# Patient Record
Sex: Female | Born: 1935
Health system: Southern US, Community
[De-identification: ages and names within clinical notes are randomized; demographics above are authoritative.]

## PROBLEM LIST (undated history)

## (undated) DIAGNOSIS — F329 Major depressive disorder, single episode, unspecified: Secondary | ICD-10-CM

## (undated) DIAGNOSIS — J45909 Unspecified asthma, uncomplicated: Secondary | ICD-10-CM

## (undated) DIAGNOSIS — I4891 Unspecified atrial fibrillation: Secondary | ICD-10-CM

## (undated) DIAGNOSIS — W19XXXA Unspecified fall, initial encounter: Secondary | ICD-10-CM

## (undated) DIAGNOSIS — K449 Diaphragmatic hernia without obstruction or gangrene: Secondary | ICD-10-CM

## (undated) DIAGNOSIS — I2510 Atherosclerotic heart disease of native coronary artery without angina pectoris: Secondary | ICD-10-CM

## (undated) DIAGNOSIS — M8580 Other specified disorders of bone density and structure, unspecified site: Secondary | ICD-10-CM

## (undated) DIAGNOSIS — N1832 Chronic kidney disease, stage 3b: Secondary | ICD-10-CM

## (undated) DIAGNOSIS — E785 Hyperlipidemia, unspecified: Secondary | ICD-10-CM

## (undated) DIAGNOSIS — R9431 Abnormal electrocardiogram [ECG] [EKG]: Secondary | ICD-10-CM

## (undated) DIAGNOSIS — Z86718 Personal history of other venous thrombosis and embolism: Secondary | ICD-10-CM

## (undated) DIAGNOSIS — I5030 Unspecified diastolic (congestive) heart failure: Secondary | ICD-10-CM

## (undated) DIAGNOSIS — J449 Chronic obstructive pulmonary disease, unspecified: Secondary | ICD-10-CM

## (undated) DIAGNOSIS — K219 Gastro-esophageal reflux disease without esophagitis: Secondary | ICD-10-CM

## (undated) DIAGNOSIS — I219 Acute myocardial infarction, unspecified: Secondary | ICD-10-CM

## (undated) DIAGNOSIS — Z7901 Long term (current) use of anticoagulants: Secondary | ICD-10-CM

## (undated) DIAGNOSIS — D649 Anemia, unspecified: Secondary | ICD-10-CM

## (undated) DIAGNOSIS — I10 Essential (primary) hypertension: Secondary | ICD-10-CM

## (undated) DIAGNOSIS — I700 Atherosclerosis of aorta: Secondary | ICD-10-CM

## (undated) DIAGNOSIS — I209 Angina pectoris, unspecified: Secondary | ICD-10-CM

## (undated) DIAGNOSIS — K635 Polyp of colon: Secondary | ICD-10-CM

## (undated) DIAGNOSIS — E440 Moderate protein-calorie malnutrition: Secondary | ICD-10-CM

## (undated) DIAGNOSIS — M419 Scoliosis, unspecified: Secondary | ICD-10-CM

## (undated) DIAGNOSIS — F32A Depression, unspecified: Secondary | ICD-10-CM

## (undated) DIAGNOSIS — H269 Unspecified cataract: Secondary | ICD-10-CM

## (undated) HISTORY — DX: Essential (primary) hypertension: I10

## (undated) HISTORY — DX: Hyperlipidemia, unspecified: E78.5

## (undated) HISTORY — DX: Unspecified fall, initial encounter: W19.XXXA

## (undated) HISTORY — DX: Major depressive disorder, single episode, unspecified: F32.9

## (undated) HISTORY — DX: Gastro-esophageal reflux disease without esophagitis: K21.9

## (undated) HISTORY — PX: ABDOMINAL HYSTERECTOMY: SHX81

## (undated) HISTORY — DX: Unspecified cataract: H26.9

## (undated) HISTORY — DX: Depression, unspecified: F32.A

## (undated) HISTORY — PX: TUBAL LIGATION: SHX77

## (undated) HISTORY — PX: TONSILLECTOMY: SUR1361

## (undated) NOTE — *Deleted (*Deleted)
[facility]  [address], [address] [place], [place] [address] Phone: [phone]  Fax: [phone]   Clinic Day:  04/02/2020  Referring physician: Reubin Milan, MD  Chief Complaint: Vanessa Farmer is a 37 y.o. female with mild thrombocytopenia and iron deficiency anemia who is seen for 6 month assessment.   HPI: The patient was last seen in the hematology clinic on 03/28/2019.  At that time, she felt "pretty good".  Energy level remained low.  Exam was stable. Hematocrit was 34.4, hemoglobin 11.5, MCV 99.1, platelets 154,000, WBC 3,600 (ANC 2,300). Platelet count in a citrate tube was 136,400. Sodium was 133. Creatinine was 1.33 (CrCl 37 ml/min). Ferritin was 90. PTT was 30. Hepatitis B core antibody was negative. ANA was positive with ribonucleic protein 1.3 (0-0.9).  During the interim, ***   Past Medical History:  Diagnosis Date  . Anemia   . Cataract   . Depression   . Fall   . GERD (gastroesophageal reflux disease)   . Hyperlipidemia   . Hypertension   . Myocardial infarction [facility])    "mild" many yrs ago    Past Surgical History:  Procedure Laterality Date  . ABDOMINAL HYSTERECTOMY    . CATARACT EXTRACTION, BILATERAL  2021  . COLONOSCOPY WITH PROPOFOL N/A 12/05/2017   Procedure: COLONOSCOPY WITH PROPOFOL;  Surgeon: Midge Minium, MD;  Location: [facility];  Service: Endoscopy;  Laterality: N/A;  . ESOPHAGOGASTRODUODENOSCOPY (EGD) WITH PROPOFOL N/A 12/05/2017   Procedure: ESOPHAGOGASTRODUODENOSCOPY (EGD) WITH PROPOFOL;  Surgeon: Midge Minium, MD;  Location: [facility];  Service: Endoscopy;  Laterality: N/A;  . POLYPECTOMY  12/05/2017   Procedure: POLYPECTOMY;  Surgeon: Midge Minium, MD;  Location: [facility];  Service: Endoscopy;;  . TONSILLECTOMY      Family History  Problem Relation Age of Onset  . Heart disease Mother   . Brain cancer Mother   . Heart attack Father   . Liver disease Daughter   . Throat  cancer Brother   . Cancer Maternal Grandfather        Unsure type    Social History:  reports that she has never smoked. She has never used smokeless tobacco. She reports that she does not drink alcohol and does not use drugs. She is originally from [place]. She lives with her granddaughter Wyatt Mage.  She lives in [place].  The patient is alone*** today.  Allergies: No Known Allergies  Current Medications: Current Outpatient Medications  Medication Sig Dispense Refill  . acetaminophen (TYLENOL) 500 MG tablet Take 2 tablets (1,000 mg total) by mouth 2 (two) times daily. 30 tablet   . amLODipine (NORVASC) 10 MG tablet Take 1 tablet (10 mg total) by mouth daily. 90 tablet 1  . aspirin 325 MG tablet Take 325 mg by mouth daily.    Marland Kitchen atorvastatin (LIPITOR) 40 MG tablet TAKE 1 TABLET(40 MG) BY MOUTH DAILY 90 tablet 1  . cholecalciferol (VITAMIN D3) 25 MCG (1000 UT) tablet Take 1,000 Units by mouth daily.    Marland Kitchen ENSURE (ENSURE) Take 237 mLs by mouth 2 (two) times daily between meals. 237 mL 12  . ferrous sulfate (FEROSUL) 325 (65 FE) MG tablet TAKE 1 TABLET BY MOUTH TWICE DAILY WITH A MEAL 60 tablet 0  . lisinopril-hydrochlorothiazide (ZESTORETIC) 20-12.5 MG tablet Take 2 tablets by mouth daily. 180 tablet 1  . Multiple Vitamins-Calcium (ONE-A-DAY WOMENS PO) Take 1 tablet by mouth daily.    . pantoprazole (PROTONIX) 40 MG tablet TAKE 1  TABLET(40 MG) BY MOUTH DAILY 90 tablet 0  . sertraline (ZOLOFT) 50 MG tablet Take 1 tablet (50 mg total) by mouth daily. 90 tablet 1  . vitamin C (ASCORBIC ACID) 500 MG tablet Take 1 tablet (500 mg total) by mouth daily. 30 tablet 2   No current facility-administered medications for this visit.    Review of Systems  Constitutional: Positive for malaise/fatigue. Negative for chills, diaphoresis, fever and weight loss (up 3 pounds; baseline 115 pounds).       Feels "pretty good".  Low energy.  HENT: Negative for congestion, ear pain, hearing loss, nosebleeds and  sore throat.   Eyes: Negative for double vision, photophobia and pain.       Cataracts.  Respiratory: Positive for shortness of breath (exertional). Negative for cough and wheezing.   Cardiovascular: Positive for leg swelling (bilateral ankles). Negative for chest pain, palpitations and PND.  Gastrointestinal: Positive for melena (on oral iron). Negative for abdominal pain, blood in stool, constipation, diarrhea, heartburn (improving), nausea and vomiting.       Eating well.  Genitourinary: Negative.  Negative for dysuria, frequency and urgency.  Musculoskeletal: Positive for back pain (scoliosis). Negative for joint pain and myalgias.       Leg cramps.  Skin: Negative.  Negative for rash.  Neurological: Positive for headaches. Negative for dizziness, tingling, sensory change and weakness.  Endo/Heme/Allergies: Negative.  Does not bruise/bleed easily.  Psychiatric/Behavioral: Negative.  Negative for depression (good) and memory loss. The patient is not nervous/anxious and does not have insomnia.   All other systems reviewed and are negative.  Performance status (ECOG): 1-2***  Vitals There were no vitals taken for this visit.   Physical Exam Vitals and nursing note reviewed.  Constitutional:      General: She is not in acute distress.    Appearance: She is well-developed. She is not diaphoretic.     Comments: Elderly woman sitting comfortably in the exam room in no acute distress.  She has a cane at her side.  HENT:     Head: Normocephalic and atraumatic.     Mouth/Throat:     Pharynx: No oropharyngeal exudate.  Eyes:     General: No scleral icterus.    Conjunctiva/sclera: Conjunctivae normal.     Pupils: Pupils are equal, round, and reactive to light.     Comments: Glasses.  Neck:     Vascular: No JVD.  Cardiovascular:     Rate and Rhythm: Normal rate and regular rhythm.     Heart sounds: Normal heart sounds. No murmur heard.   Pulmonary:     Effort: Pulmonary effort is  normal. No respiratory distress.     Breath sounds: Normal breath sounds. No wheezing or rales.  Abdominal:     General: Bowel sounds are normal. There is no distension.     Palpations: Abdomen is soft. There is no mass.     Tenderness: There is no abdominal tenderness. There is no guarding or rebound.  Musculoskeletal:        General: No tenderness. Normal range of motion.     Cervical back: Normal range of motion and neck supple.     Comments: Kyphosis.  Scoliosis.  Lymphadenopathy:     Head:     Right side of head: No preauricular, posterior auricular or occipital adenopathy.     Left side of head: No preauricular, posterior auricular or occipital adenopathy.     Cervical: No cervical adenopathy.     Upper Body:  Right upper body: No supraclavicular adenopathy.     Left upper body: No supraclavicular adenopathy.     Lower Body: No right inguinal adenopathy. No left inguinal adenopathy.  Skin:    General: Skin is warm and dry.     Coloration: Skin is not pale.     Findings: No erythema or rash.  Neurological:     Mental Status: She is alert and oriented to person, place, and time.  Psychiatric:        Behavior: Behavior normal.        Thought Content: Thought content normal.        Judgment: Judgment normal.     No visits with results within 3 Day(s) from this visit.  Latest known visit with results is:  Office Visit on 09/26/2019  Component Date Value Ref Range Status  . TSH 09/26/2019 1.470  0.450 - 4.500 uIU/mL Final  . Free T4 09/26/2019 1.13  0.82 - 1.77 ng/dL Final  . Glucose 16/02/9603 94  65 - 99 mg/dL Final  . BUN 54/01/8118 29* 8 - 27 mg/dL Final  . Creatinine, Ser 09/26/2019 1.31* 0.57 - 1.00 mg/dL Final  . GFR calc non Af Amer 09/26/2019 38* >59 mL/min/1.73 Final  . GFR calc Af Amer 09/26/2019 44* >59 mL/min/1.73 Final   Comment: **[facility] currently reports eGFR in compliance with the current**   recommendations of the [employer]. [facility]  will   update reporting as new guidelines are published from the NKF-ASN   Task force.   . BUN/Creatinine Ratio 09/26/2019 22  12 - 28 Final  . Sodium 09/26/2019 139  134 - 144 mmol/L Final  . Potassium 09/26/2019 4.0  3.5 - 5.2 mmol/L Final  . Chloride 09/26/2019 98  96 - 106 mmol/L Final  . CO2 09/26/2019 26  20 - 29 mmol/L Final  . Calcium 09/26/2019 9.2  8.7 - 10.3 mg/dL Final  . Total Protein 09/26/2019 6.5  6.0 - 8.5 g/dL Final  . Albumin 14/78/2956 4.0  3.6 - 4.6 g/dL Final  . Globulin, Total 09/26/2019 2.5  1.5 - 4.5 g/dL Final  . Albumin/Globulin Ratio 09/26/2019 1.6  1.2 - 2.2 Final  . Bilirubin Total 09/26/2019 0.3  0.0 - 1.2 mg/dL Final  . Alkaline Phosphatase 09/26/2019 65  48 - 121 IU/L Final                 **Please note reference interval change**  . AST 09/26/2019 25  0 - 40 IU/L Final  . ALT 09/26/2019 14  0 - 32 IU/L Final  . Cholesterol, Total 09/26/2019 202* 100 - 199 mg/dL Final  . Triglycerides 09/26/2019 79  0 - 149 mg/dL Final  . HDL 21/30/8657 61  >39 mg/dL Final  . VLDL Cholesterol Cal 09/26/2019 14  5 - 40 mg/dL Final  . LDL Chol Calc (NIH) 09/26/2019 127* 0 - 99 mg/dL Final  . Chol/HDL Ratio 09/26/2019 3.3  0.0 - 4.4 ratio Final   Comment:                                   T. Chol/HDL Ratio                                             Men  Women  1/2 Avg.Risk  3.4    3.3                                   Avg.Risk  5.0    4.4                                2X Avg.Risk  9.6    7.1                                3X Avg.Risk 23.4   11.0     Assessment:  Vanessa Farmer is a 78 y.o. female with mild thrombocytopenia and iron deficiency anemia.  Platelet count has ranged between 138,000 - 158,000.  Diet is good.  She is on oral iron.  Work-up on 10/06/2017 revealed a hematocrit of 32.8, hemoglobin 10.9, MCV 93.9, WBC 4,300 (ANC 2,900), and platelets 152,000.  Ferritin was 29 with an iron saturation 10% and TIBC 354.  Normal  studies included:  B12 (744), folate (45), acute hepatitis panel, HIV testing, LDH 160, SPEP, and flow cytometry.   Peripheral smear was unremarkable.  Urinalysis on 10/24/2017 revealed no hematuria.  TSH was normal on 09/29/2017.  Abdominal ultrasound on 10/11/2017 revealed a normal spleen.   EGD on 12/05/2017 revealed a 10 cm hiatal hernia and grade C reflux esophagitis. Examined duodenum was normal.  Colonoscopy on 12/05/2017 revealed three 1-2 mm polyps in the distal sigmoid colon. There were non-bleeding internal hemorrhoids. Pathology revealed hyperplastic polyps and small bowel mucosa with no significant pathologic changes.   Ferritin has been followed: 29 on 10/06/2017, 34 on 01/18/2018, 44 on 04/20/2018, 88 on 09/21/2018, and 90 on 03/28/2019.   She has chronic kidney disease. Creatinine was (CrCl 36 ml/min) on 09/29/2017.  Symptomatically, ***  Plan: 1.   Labs today: CBC with diff, ferritin, iron studies, B12, folate.   2.   Iron deficiency anemia             Hematocrit 34.3.  Hemoglobin 11.5.  MCV 99.1.   Ferritin 90.  Diet appears good.  She denies any bleeding.             EGD and colonoscopy in 11/2017 revealed reflux only.             She is taking ferrous sulfate 325 mg/day.  Ferritin goal 100. 3.   Thrombocytopenia, resolved             Platelets 154,000 (normal).  Prior work-up was negative.  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             Additional labs drawn today.                           Platelet count in a blue top tube 136,400 to r/o pseudothrombocytopenia.                         PTT and hepatitis B testing were negative.   ANA was + today with 1.3 ribonucleic protein (0-0.9).    Significance unclear. 4.   RTC in 1 year for MD assessment and labs (CBC with diff, ferritin).  I discussed the assessment and treatment plan with the patient.  The patient was provided  an opportunity to ask questions and all were answered.  The patient agreed with the  plan and demonstrated an understanding of the instructions.  The patient was advised to call back if the symptoms worsen or if the condition fails to improve as anticipated.  I provided *** minutes of face-to-face time during this this encounter and > 50% was spent counseling as documented under my assessment and plan.  Rosey Bath, MD, PhD    04/02/2020, 12:26 PM  I, Danella Penton Tufford, am acting as Neurosurgeon for [employer]. Merlene Pulling, MD, PhD.  I, Melissa C. Merlene Pulling, MD, have reviewed the above documentation for accuracy and completeness, and I agree with the above.

## (undated) NOTE — *Deleted (*Deleted)
[facility]  [address] [place], [place] [address] Phone: [phone]  Fax: [phone]   Clinic Day:  03/26/2020  Referring physician: Reubin Milan, MD  Chief Complaint: Vanessa Farmer is a 30 y.o. female with mild thrombocytopenia and iron deficiency anemia who is seen for 6 month assessment.   HPI: The patient was last seen in the hematology clinic on 03/28/2019. At that time, she felt "pretty good".  Energy level remained low.  Exam was stable. Hematocrit was 34.4, hemoglobin 11.5, MCV 99.1, platelets 154,000, WBC 3,600 (ANC 2,300). Platelet by citrate was 136.4. Sodium was 133. Chloride was 95. BUN was 27. Creatinine was 1.33 (CrCl 37 ml/min). Ferritin was 90. APTT was 30. Hepatitis B core antibody was negative. ANA was positive. Ribonucleic protein was 1.3.  During the interim, ***   Past Medical History:  Diagnosis Date  . Anemia   . Cataract   . Depression   . Fall   . GERD (gastroesophageal reflux disease)   . Hyperlipidemia   . Hypertension   . Myocardial infarction [facility])    "mild" many yrs ago    Past Surgical History:  Procedure Laterality Date  . ABDOMINAL HYSTERECTOMY    . CATARACT EXTRACTION, BILATERAL  2021  . COLONOSCOPY WITH PROPOFOL N/A 12/05/2017   Procedure: COLONOSCOPY WITH PROPOFOL;  Surgeon: Midge Minium, MD;  Location: [facility];  Service: Endoscopy;  Laterality: N/A;  . ESOPHAGOGASTRODUODENOSCOPY (EGD) WITH PROPOFOL N/A 12/05/2017   Procedure: ESOPHAGOGASTRODUODENOSCOPY (EGD) WITH PROPOFOL;  Surgeon: Midge Minium, MD;  Location: [facility];  Service: Endoscopy;  Laterality: N/A;  . POLYPECTOMY  12/05/2017   Procedure: POLYPECTOMY;  Surgeon: Midge Minium, MD;  Location: [facility];  Service: Endoscopy;;  . TONSILLECTOMY      Family History  Problem Relation Age of Onset  . Heart disease Mother   . Brain cancer Mother   . Heart attack Father   . Liver disease Daughter   .  Throat cancer Brother   . Cancer Maternal Grandfather        Unsure type    Social History:  reports that she has never smoked. She has never used smokeless tobacco. She reports that she does not drink alcohol and does not use drugs. She is originally from [place]. She lives with her granddaughter Wyatt Mage.  She lives in [place].  The patient is alone*** today.  Allergies: No Known Allergies  Current Medications: Current Outpatient Medications  Medication Sig Dispense Refill  . acetaminophen (TYLENOL) 500 MG tablet Take 2 tablets (1,000 mg total) by mouth 2 (two) times daily. 30 tablet   . amLODipine (NORVASC) 10 MG tablet Take 1 tablet (10 mg total) by mouth daily. 90 tablet 1  . aspirin 325 MG tablet Take 325 mg by mouth daily.    Marland Kitchen atorvastatin (LIPITOR) 40 MG tablet TAKE 1 TABLET(40 MG) BY MOUTH DAILY 90 tablet 1  . cholecalciferol (VITAMIN D3) 25 MCG (1000 UT) tablet Take 1,000 Units by mouth daily.    Marland Kitchen ENSURE (ENSURE) Take 237 mLs by mouth 2 (two) times daily between meals. 237 mL 12  . ferrous sulfate (FEROSUL) 325 (65 FE) MG tablet TAKE 1 TABLET BY MOUTH TWICE DAILY WITH A MEAL 60 tablet 0  . lisinopril-hydrochlorothiazide (ZESTORETIC) 20-12.5 MG tablet Take 2 tablets by mouth daily. 180 tablet 1  . Multiple Vitamins-Calcium (ONE-A-DAY WOMENS PO) Take 1 tablet by mouth daily.    . pantoprazole (PROTONIX) 40 MG tablet TAKE  1 TABLET(40 MG) BY MOUTH DAILY 90 tablet 0  . sertraline (ZOLOFT) 50 MG tablet Take 1 tablet (50 mg total) by mouth daily. 90 tablet 1  . vitamin C (ASCORBIC ACID) 500 MG tablet Take 1 tablet (500 mg total) by mouth daily. 30 tablet 2   No current facility-administered medications for this visit.    Review of Systems  Constitutional: Positive for malaise/fatigue. Negative for chills, diaphoresis, fever and weight loss (up 3 pounds; baseline 115 pounds).       Feels "pretty good".  Low energy.  HENT: Negative for congestion, ear pain, hearing loss,  nosebleeds and sore throat.   Eyes: Negative for double vision, photophobia and pain.       Cataracts.  Respiratory: Positive for shortness of breath (exertional). Negative for cough and wheezing.   Cardiovascular: Positive for leg swelling (bilateral ankles). Negative for chest pain, palpitations and PND.  Gastrointestinal: Positive for melena (on oral iron). Negative for abdominal pain, blood in stool, constipation, diarrhea, heartburn (improving), nausea and vomiting.       Eating well.  Genitourinary: Negative.  Negative for dysuria, frequency and urgency.  Musculoskeletal: Positive for back pain (scoliosis). Negative for joint pain and myalgias.       Leg cramps.  Skin: Negative.  Negative for rash.  Neurological: Positive for headaches. Negative for dizziness, tingling, sensory change and weakness.  Endo/Heme/Allergies: Negative.  Does not bruise/bleed easily.  Psychiatric/Behavioral: Negative.  Negative for depression (good) and memory loss. The patient is not nervous/anxious and does not have insomnia.   All other systems reviewed and are negative.  Performance status (ECOG): 1-2***  Vitals There were no vitals taken for this visit.   Physical Exam Vitals and nursing note reviewed.  Constitutional:      General: She is not in acute distress.    Appearance: She is well-developed. She is not diaphoretic.     Comments: Elderly woman sitting comfortably in the exam room in no acute distress.  She has a cane at her side.  HENT:     Head: Normocephalic and atraumatic.     Mouth/Throat:     Pharynx: No oropharyngeal exudate.  Eyes:     General: No scleral icterus.    Conjunctiva/sclera: Conjunctivae normal.     Pupils: Pupils are equal, round, and reactive to light.     Comments: Glasses.  Neck:     Vascular: No JVD.  Cardiovascular:     Rate and Rhythm: Normal rate and regular rhythm.     Heart sounds: Normal heart sounds. No murmur heard.   Pulmonary:     Effort:  Pulmonary effort is normal. No respiratory distress.     Breath sounds: Normal breath sounds. No wheezing or rales.  Abdominal:     General: Bowel sounds are normal. There is no distension.     Palpations: Abdomen is soft. There is no mass.     Tenderness: There is no abdominal tenderness. There is no guarding or rebound.  Musculoskeletal:        General: No tenderness. Normal range of motion.     Cervical back: Normal range of motion and neck supple.     Comments: Kyphosis.  Scoliosis.  Lymphadenopathy:     Head:     Right side of head: No preauricular, posterior auricular or occipital adenopathy.     Left side of head: No preauricular, posterior auricular or occipital adenopathy.     Cervical: No cervical adenopathy.     Upper  Body:     Right upper body: No supraclavicular adenopathy.     Left upper body: No supraclavicular adenopathy.     Lower Body: No right inguinal adenopathy. No left inguinal adenopathy.  Skin:    General: Skin is warm and dry.     Coloration: Skin is not pale.     Findings: No erythema or rash.  Neurological:     Mental Status: She is alert and oriented to person, place, and time.  Psychiatric:        Behavior: Behavior normal.        Thought Content: Thought content normal.        Judgment: Judgment normal.     No visits with results within 3 Day(s) from this visit.  Latest known visit with results is:  Office Visit on 09/26/2019  Component Date Value Ref Range Status  . TSH 09/26/2019 1.470  0.450 - 4.500 uIU/mL Final  . Free T4 09/26/2019 1.13  0.82 - 1.77 ng/dL Final  . Glucose 16/02/9603 94  65 - 99 mg/dL Final  . BUN 54/01/8118 29* 8 - 27 mg/dL Final  . Creatinine, Ser 09/26/2019 1.31* 0.57 - 1.00 mg/dL Final  . GFR calc non Af Amer 09/26/2019 38* >59 mL/min/1.73 Final  . GFR calc Af Amer 09/26/2019 44* >59 mL/min/1.73 Final   Comment: **[facility] currently reports eGFR in compliance with the current**   recommendations of the [employer]. [facility] will   update reporting as new guidelines are published from the NKF-ASN   Task force.   . BUN/Creatinine Ratio 09/26/2019 22  12 - 28 Final  . Sodium 09/26/2019 139  134 - 144 mmol/L Final  . Potassium 09/26/2019 4.0  3.5 - 5.2 mmol/L Final  . Chloride 09/26/2019 98  96 - 106 mmol/L Final  . CO2 09/26/2019 26  20 - 29 mmol/L Final  . Calcium 09/26/2019 9.2  8.7 - 10.3 mg/dL Final  . Total Protein 09/26/2019 6.5  6.0 - 8.5 g/dL Final  . Albumin 14/78/2956 4.0  3.6 - 4.6 g/dL Final  . Globulin, Total 09/26/2019 2.5  1.5 - 4.5 g/dL Final  . Albumin/Globulin Ratio 09/26/2019 1.6  1.2 - 2.2 Final  . Bilirubin Total 09/26/2019 0.3  0.0 - 1.2 mg/dL Final  . Alkaline Phosphatase 09/26/2019 65  48 - 121 IU/L Final                 **Please note reference interval change**  . AST 09/26/2019 25  0 - 40 IU/L Final  . ALT 09/26/2019 14  0 - 32 IU/L Final  . Cholesterol, Total 09/26/2019 202* 100 - 199 mg/dL Final  . Triglycerides 09/26/2019 79  0 - 149 mg/dL Final  . HDL 21/30/8657 61  >39 mg/dL Final  . VLDL Cholesterol Cal 09/26/2019 14  5 - 40 mg/dL Final  . LDL Chol Calc (NIH) 09/26/2019 127* 0 - 99 mg/dL Final  . Chol/HDL Ratio 09/26/2019 3.3  0.0 - 4.4 ratio Final   Comment:                                   T. Chol/HDL Ratio                                             Men  Women                               1/2 Avg.Risk  3.4    3.3                                   Avg.Risk  5.0    4.4                                2X Avg.Risk  9.6    7.1                                3X Avg.Risk 23.4   11.0     Assessment:  CALDONIA LEAP is a 8 y.o. female with mild thrombocytopenia and iron deficiency anemia.  Platelet count has ranged between 138,000 - 158,000.  Diet is good.  She is on oral iron.  Work-up on 10/06/2017 revealed a hematocrit of 32.8, hemoglobin 10.9, MCV 93.9, WBC 4,300 (ANC 2,900), and platelets 152,000.  Ferritin was 29 with an iron saturation 10% and TIBC  354.  Normal studies included:  B12 (744), folate (45), acute hepatitis panel, HIV testing, LDH 160, SPEP, and flow cytometry.   Peripheral smear was unremarkable.  Urinalysis on 10/24/2017 revealed no hematuria.  TSH was normal on 09/29/2017.  Abdominal ultrasound on 10/11/2017 revealed a normal spleen.   EGD on 12/05/2017 revealed a 10 cm hiatal hernia and grade C reflux esophagitis. Examined duodenum was normal.  Colonoscopy on 12/05/2017 revealed three 1-2 mm polyps in the distal sigmoid colon. There were non-bleeding internal hemorrhoids. Pathology revealed hyperplastic polyps and small bowel mucosa with no significant pathologic changes.   Ferritin has been followed: 29 on 10/06/2017, 34 on 01/18/2018, 44 on 04/20/2018, 88 on 09/21/2018, and 90 on 03/28/2019.   She has chronic kidney disease. Creatinine was (CrCl 36 ml/min) on 09/29/2017.  Symptomatically, ***  Plan: 1.   Labs today: CBC with diff, ferritin   2.   Iron deficiency anemia             Hematocrit 34.3.  Hemoglobin 11.5.  MCV 99.1.   Ferritin 90.  Diet appears good.  She denies any bleeding.             EGD and colonoscopy in 11/2017 revealed reflux only.             She is taking ferrous sulfate 325 mg/day.  Ferritin goal 100. 3.   Thrombocytopenia, resolved             Platelets 154,000 (normal).  Prior work-up was negative.  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             Additional labs drawn today.                           Platelet count in a blue top tube 136,400 to r/o pseudothrombocytopenia.                         PTT and hepatitis B testing were negative.   ANA was + today with 1.3 ribonucleic protein (0-0.9).    Significance unclear. 4.   RTC in  1 year for MD assessment and labs (CBC with diff, ferritin).  I discussed the assessment and treatment plan with the patient.  The patient was provided an opportunity to ask questions and all were answered.  The patient agreed with the plan and  demonstrated an understanding of the instructions.  The patient was advised to call back if the symptoms worsen or if the condition fails to improve as anticipated.  I provided *** minutes of face-to-face time during this this encounter and > 50% was spent counseling as documented under my assessment and plan.  Rosey Bath, MD, PhD    03/26/2020, 11:24 AM  I, Danella Penton Tufford, am acting as Neurosurgeon for [employer]. Merlene Pulling, MD, PhD.  I, Melissa C. Merlene Pulling, MD, have reviewed the above documentation for accuracy and completeness, and I agree with the above.

---

## 2016-06-07 ENCOUNTER — Encounter: Payer: Self-pay | Admitting: Family Medicine

## 2016-06-07 ENCOUNTER — Ambulatory Visit (INDEPENDENT_AMBULATORY_CARE_PROVIDER_SITE_OTHER): Payer: Medicare HMO | Admitting: Family Medicine

## 2016-06-07 VITALS — BP 138/80 | HR 84 | Resp 16 | Ht 59.0 in | Wt 113.0 lb

## 2016-06-07 DIAGNOSIS — I2510 Atherosclerotic heart disease of native coronary artery without angina pectoris: Secondary | ICD-10-CM | POA: Insufficient documentation

## 2016-06-07 DIAGNOSIS — M810 Age-related osteoporosis without current pathological fracture: Secondary | ICD-10-CM | POA: Diagnosis not present

## 2016-06-07 DIAGNOSIS — I10 Essential (primary) hypertension: Secondary | ICD-10-CM | POA: Insufficient documentation

## 2016-06-07 DIAGNOSIS — M419 Scoliosis, unspecified: Secondary | ICD-10-CM

## 2016-06-07 DIAGNOSIS — F321 Major depressive disorder, single episode, moderate: Secondary | ICD-10-CM | POA: Insufficient documentation

## 2016-06-07 DIAGNOSIS — Z23 Encounter for immunization: Secondary | ICD-10-CM | POA: Diagnosis not present

## 2016-06-07 DIAGNOSIS — R2681 Unsteadiness on feet: Secondary | ICD-10-CM

## 2016-06-07 NOTE — Progress Notes (Signed)
Date:  06/07/2016   Name:  Vanessa Farmer   DOB:  [birth year]   MRN:  [id number]  PCP:  No primary care provider on file.    Chief Complaint: Establish Care (ER FU after fall in December. )   History of Present Illness:  This is a 81 y.o. female seen for initial visit. HTN on Prinzide x yrs, usually well controlled. CAD s/p MI 15 yrs ago, no current angina but some chronic DOE. Told has osteoporosis and scoliosis but no known fractures. C/o R hip pain, fell one month ago but no pain then, on Mobic x 3 months, helps. Takes Tylenol prn headaches about twice a week. Hx depression on Zoloft x 2 months, seems to help. Hospitalized 2 months ago for dehydration and pneumonia. Father died pneumonia in 37's, mother brain tumor in 31's, brother throat ca and cirrhosis, sister has had multiple jt replacements. No tobacco or alcohol use. Had flu and zoster imms but no pneumo or recent tet imms. Has occ pedal edema, none today.  Review of Systems:  Review of Systems  Constitutional: Positive for fatigue. Negative for chills and fever.  HENT: Negative for ear pain and trouble swallowing.   Eyes: Negative for pain.  Respiratory: Negative for cough and wheezing.   Cardiovascular: Negative for chest pain.  Gastrointestinal: Negative for abdominal pain.  Endocrine: Negative for polydipsia and polyuria.  Genitourinary: Negative for dysuria.  Musculoskeletal: Negative for myalgias.  Neurological: Negative for tremors, syncope and light-headedness.  Hematological: Negative for adenopathy.  Psychiatric/Behavioral: Negative for agitation and confusion.    Patient Active Problem List   Diagnosis Date Noted  . Scoliosis 06/07/2016  . Osteoporosis 06/07/2016  . Hypertension 06/07/2016  . CAD (coronary artery disease) 06/07/2016  . Depression, major, single episode, moderate ([place]) 06/07/2016    Prior to Admission medications   Medication Sig Start Date End Date Taking? Authorizing Provider  acetaminophen  (TYLENOL) 500 MG tablet Take 500 mg by mouth every 6 (six) hours as needed.   Yes Historical Provider, MD  aspirin 81 MG chewable tablet Chew 1 tablet by mouth daily.   Yes Historical Provider, MD  lisinopril-hydrochlorothiazide (PRINZIDE,ZESTORETIC) 20-12.5 MG tablet Take 2 tablets by mouth daily.   Yes Historical Provider, MD  meloxicam (MOBIC) 15 MG tablet Take 15 mg by mouth daily.   Yes Historical Provider, MD  Multiple Vitamins-Calcium (ONE-A-DAY WOMENS PO) Take 1 tablet by mouth daily.   Yes Historical Provider, MD  sertraline (ZOLOFT) 50 MG tablet Take 50 mg by mouth daily.   Yes Historical Provider, MD    No Known Allergies  History reviewed. No pertinent surgical history.  Social History  Substance Use Topics  . Smoking status: Never Smoker  . Smokeless tobacco: Never Used  . Alcohol use No    History reviewed. No pertinent family history.  Medication list has been reviewed and updated.  Physical Examination: BP 138/80   Pulse 84   Resp 16   SpO2 95%   Physical Exam  Constitutional: She is oriented to person, place, and time. She appears well-developed and well-nourished.  HENT:  Head: Normocephalic and atraumatic.  Right Ear: External ear normal.  Left Ear: External ear normal.  Nose: Nose normal.  Mouth/Throat: Oropharynx is clear and moist.  TMs clear Dentition poor  Eyes: Conjunctivae and EOM are normal. Pupils are equal, round, and reactive to light. No scleral icterus.  Neck: Neck supple. No thyromegaly present.  Cardiovascular: Normal rate, regular rhythm and normal heart sounds.  Pulmonary/Chest: Effort normal and breath sounds normal.  Abdominal: Soft. She exhibits no distension and no mass. There is no tenderness.  Musculoskeletal: Normal range of motion. She exhibits no edema.  Neg B SLR  Marked thoracolumbar scoliosis  Lymphadenopathy:    She has no cervical adenopathy.  Neurological: She is alert and oriented to person, place, and time.   Mini-cog 5/5 Romberg neg Gait unsteady  Skin: Skin is warm and dry.  Psychiatric: She has a normal mood and affect. Her behavior is normal.  Nursing note and vitals reviewed.   Assessment and Plan:  1. Essential hypertension Marginal control, may improve off NSAID - Comprehensive Metabolic Panel (CMET)  2. Coronary artery disease involving native coronary artery of native heart without angina pectoris Stable on asa, consider statin - CBC - Lipid Profile  3. Age-related osteoporosis without current pathological fracture - Vitamin D (25 hydroxy)  4. Depression, major, single episode, moderate (HCC) Improved on Zoloft - TSH  5. Scoliosis of thoracolumbar spine, unspecified scoliosis type  6. Need for pneumococcal vaccination - Pneumococcal conjugate vaccine 13-valent  7. Need for immunization with diphtheria, tetanus, and poliovirus vaccine - Tdap vaccine greater than or equal to 7yo IM  8. Gait instability - B12  9. Med review Consider d/c Mobic/Zoloft next visit  Return in about 4 weeks (around 07/05/2016).   45 minutes spent with patient, over half in counseling  [name]. [facility]  06/07/2016

## 2016-06-08 ENCOUNTER — Encounter: Payer: Self-pay | Admitting: Family Medicine

## 2016-06-08 ENCOUNTER — Other Ambulatory Visit: Payer: Self-pay | Admitting: Family Medicine

## 2016-06-08 DIAGNOSIS — E785 Hyperlipidemia, unspecified: Secondary | ICD-10-CM | POA: Insufficient documentation

## 2016-06-08 DIAGNOSIS — N183 Chronic kidney disease, stage 3 unspecified: Secondary | ICD-10-CM | POA: Insufficient documentation

## 2016-06-08 LAB — CBC
HEMOGLOBIN: 12.1 g/dL (ref 11.1–15.9)
Hematocrit: 35.9 % (ref 34.0–46.6)
MCH: 30.7 pg (ref 26.6–33.0)
MCHC: 33.7 g/dL (ref 31.5–35.7)
MCV: 91 fL (ref 79–97)
Platelets: 200 10*3/uL (ref 150–379)
RBC: 3.94 x10E6/uL (ref 3.77–5.28)
RDW: 14.8 % (ref 12.3–15.4)
WBC: 6.7 10*3/uL (ref 3.4–10.8)

## 2016-06-08 LAB — COMPREHENSIVE METABOLIC PANEL
A/G RATIO: 1.5 (ref 1.2–2.2)
ALK PHOS: 110 IU/L (ref 39–117)
ALT: 12 IU/L (ref 0–32)
AST: 14 IU/L (ref 0–40)
Albumin: 4.1 g/dL (ref 3.5–4.8)
BUN/Creatinine Ratio: 35 — ABNORMAL HIGH (ref 12–28)
BUN: 40 mg/dL — ABNORMAL HIGH (ref 8–27)
CHLORIDE: 95 mmol/L — AB (ref 96–106)
CO2: 28 mmol/L (ref 18–29)
Calcium: 9.4 mg/dL (ref 8.7–10.3)
Creatinine, Ser: 1.15 mg/dL — ABNORMAL HIGH (ref 0.57–1.00)
GFR calc Af Amer: 53 mL/min/{1.73_m2} — ABNORMAL LOW (ref >59)
GFR calc non Af Amer: 46 mL/min/{1.73_m2} — ABNORMAL LOW (ref >59)
Globulin, Total: 2.8 g/dL (ref 1.5–4.5)
Glucose: 98 mg/dL (ref 65–99)
POTASSIUM: 4.9 mmol/L (ref 3.5–5.2)
Sodium: 137 mmol/L (ref 134–144)
Total Protein: 6.9 g/dL (ref 6.0–8.5)

## 2016-06-08 LAB — VITAMIN B12: Vitamin B-12: 669 pg/mL (ref 232–1245)

## 2016-06-08 LAB — LIPID PANEL
CHOL/HDL RATIO: 3.8 ratio (ref 0.0–4.4)
Cholesterol, Total: 222 mg/dL — ABNORMAL HIGH (ref 100–199)
HDL: 59 mg/dL (ref >39)
LDL Calculated: 141 mg/dL — ABNORMAL HIGH (ref 0–99)
Triglycerides: 110 mg/dL (ref 0–149)
VLDL Cholesterol Cal: 22 mg/dL (ref 5–40)

## 2016-06-08 LAB — TSH: TSH: 3.24 u[IU]/mL (ref 0.450–4.500)

## 2016-06-08 LAB — VITAMIN D 25 HYDROXY (VIT D DEFICIENCY, FRACTURES): VIT D 25 HYDROXY: 40.5 ng/mL (ref 30.0–100.0)

## 2016-06-08 MED ORDER — ATORVASTATIN CALCIUM 40 MG PO TABS: 40.0000 mg | ORAL_TABLET | Freq: Every day | ORAL | 2 refills | Status: DC

## 2016-06-08 MED ORDER — ACETAMINOPHEN 500 MG PO TABS: 1000.0000 mg | ORAL_TABLET | Freq: Two times a day (BID) | ORAL | Status: DC

## 2016-06-14 ENCOUNTER — Other Ambulatory Visit: Payer: Self-pay

## 2016-06-14 MED ORDER — ATORVASTATIN CALCIUM 40 MG PO TABS: 40.0000 mg | ORAL_TABLET | Freq: Every day | ORAL | 2 refills | Status: DC

## 2016-06-14 MED ORDER — ACETAMINOPHEN 500 MG PO TABS: 1000.0000 mg | ORAL_TABLET | Freq: Two times a day (BID) | ORAL | Status: DC

## 2016-06-14 NOTE — Telephone Encounter (Signed)
meds went to wrong pharmacy I have sent them to [facility]. She said the bottle she found that needs filled is Amlodipine Besylate 5 mg daily. I have not sent this in since it is not in chart. She also reports going to [facility] program and said they were supposed to be contacting you to send notes and letter referring patient to [place]. I do not see where anything has been sent to [place].

## 2016-06-15 ENCOUNTER — Other Ambulatory Visit: Payer: Self-pay | Admitting: Family Medicine

## 2016-06-15 ENCOUNTER — Telehealth: Payer: Self-pay

## 2016-06-15 MED ORDER — AMLODIPINE BESYLATE 5 MG PO TABS: 5.0000 mg | ORAL_TABLET | Freq: Every day | ORAL | 3 refills | Status: DC

## 2016-06-15 NOTE — Telephone Encounter (Signed)
Amlodipine rx sent

## 2016-06-15 NOTE — Progress Notes (Signed)
amlo

## 2016-06-15 NOTE — Telephone Encounter (Signed)
Faxed DSS Papers

## 2016-07-05 ENCOUNTER — Ambulatory Visit: Payer: Medicare HMO | Admitting: Family Medicine

## 2016-07-27 ENCOUNTER — Ambulatory Visit: Payer: Medicare HMO | Admitting: Family Medicine

## 2016-08-26 ENCOUNTER — Ambulatory Visit: Payer: Medicare HMO | Admitting: Family Medicine

## 2016-09-20 ENCOUNTER — Ambulatory Visit (INDEPENDENT_AMBULATORY_CARE_PROVIDER_SITE_OTHER): Payer: Medicare HMO | Admitting: Family Medicine

## 2016-09-20 ENCOUNTER — Encounter: Payer: Self-pay | Admitting: Family Medicine

## 2016-09-20 VITALS — BP 138/82 | HR 73 | Resp 16 | Ht 59.0 in | Wt 110.0 lb

## 2016-09-20 DIAGNOSIS — N183 Chronic kidney disease, stage 3 unspecified: Secondary | ICD-10-CM

## 2016-09-20 DIAGNOSIS — I10 Essential (primary) hypertension: Secondary | ICD-10-CM

## 2016-09-20 DIAGNOSIS — E785 Hyperlipidemia, unspecified: Secondary | ICD-10-CM

## 2016-09-20 DIAGNOSIS — I2510 Atherosclerotic heart disease of native coronary artery without angina pectoris: Secondary | ICD-10-CM

## 2016-09-20 DIAGNOSIS — F321 Major depressive disorder, single episode, moderate: Secondary | ICD-10-CM

## 2016-09-20 DIAGNOSIS — M419 Scoliosis, unspecified: Secondary | ICD-10-CM

## 2016-09-20 MED ORDER — TRAMADOL HCL 50 MG PO TABS: 50.0000 mg | ORAL_TABLET | Freq: Three times a day (TID) | ORAL | 0 refills | Status: DC | PRN

## 2016-09-20 NOTE — Progress Notes (Signed)
Date:  09/20/2016   Name:  Vanessa Farmer              DOB:  May 31, 1935                       MRN:  [id number]  PCP:  Adline Potter, MD    Chief Complaint: Hypertension and Back Pain (Severe this week- Has scoliosis and was seeing pcp in [place]. Awaiting records. )   History of Present Illness:  This is a 81 y.o. female seen for one month f/u from initial visit. Blood work showed CKD3 and LDL 141 with known CVD. Never got notice to stop Mobic and start Lipitor. C/o worsened back pain, known severe scoliosis, had w/u in [place], placed on Mobic which doesn't help much, no other interventions recommended. Taking Tylenol 1000 mg qam only. Otherwise doing well, feels depression improved on Zoloft. Drinks caffeinated coffee all day.  Review of Systems:  Review of Systems  Constitutional: Negative for chills, fever and unexpected weight change.  Respiratory: Negative for cough and shortness of breath.   Cardiovascular: Negative for chest pain, palpitations and leg swelling.  Gastrointestinal: Negative for abdominal pain.  Genitourinary: Negative for difficulty urinating.  Neurological: Negative for syncope and light-headedness.        Patient Active Problem List   Diagnosis Date Noted  . CKD (chronic kidney disease) stage 3, GFR 30-59 ml/min 06/08/2016  . Hyperlipidemia 06/08/2016  . Scoliosis 06/07/2016  . Osteoporosis 06/07/2016  . Hypertension 06/07/2016  . CAD (coronary artery disease) 06/07/2016  . Depression, major, single episode, moderate ([place]) 06/07/2016           Prior to Admission medications   Medication Sig Start Date End Date Taking? Authorizing Provider  acetaminophen (TYLENOL) 500 MG tablet Take 2 tablets (1,000 mg total) by mouth 2 (two) times daily. 06/14/16  Yes Russell Quinney, Gwyndolyn Saxon, MD  amLODipine (NORVASC) 5 MG tablet Take 1 tablet (5 mg total) by mouth daily. 06/15/16  Yes Jeremih Dearmas, Gwyndolyn Saxon, MD  aspirin 81 MG chewable tablet Chew 1 tablet by mouth daily.    Yes [provider]  atorvastatin (LIPITOR) 40 MG tablet Take 1 tablet (40 mg total) by mouth daily. 06/14/16  Yes Joel Cowin, Gwyndolyn Saxon, MD  lisinopril-hydrochlorothiazide (PRINZIDE,ZESTORETIC) 20-12.5 MG tablet Take 2 tablets by mouth daily.   Yes [provider]  Multiple Vitamins-Calcium (ONE-A-DAY WOMENS PO) Take 1 tablet by mouth daily.   Yes [provider]  sertraline (ZOLOFT) 50 MG tablet Take 50 mg by mouth daily.   Yes [provider]  traMADol (ULTRAM) 50 MG tablet Take 1 tablet (50 mg total) by mouth every 8 (eight) hours as needed for severe pain. 09/20/16   Adline Potter, MD    No Known Allergies  History reviewed. No pertinent surgical history.      Social History  Substance Use Topics  . Smoking status: Never Smoker  . Smokeless tobacco: Never Used  . Alcohol use No    History reviewed. No pertinent family history.  Medication list has been reviewed and updated.  Physical Examination: BP 138/82   Pulse 73   Resp 16   Ht 4\' 11"  (1.499 m)   Wt 110 lb (49.9 kg)   SpO2 98%   BMI 22.22 kg/m   Physical Exam  Constitutional: She appears well-developed.  Cardiovascular: Normal rate, regular rhythm and normal heart sounds.   Pulmonary/Chest: Effort normal and breath sounds normal.  Musculoskeletal: She exhibits no edema.  Neurological:  She is alert.  Skin: Skin is warm and dry.  Psychiatric: She has a normal mood and affect. Her behavior is normal.  Nursing note and vitals reviewed.   Assessment and Plan:  1. Scoliosis of thoracolumbar spine, unspecified scoliosis type D/c Mobic, increase Tylenol to 1000 mg bid, begin tramadol prn severe pain only  2. Essential hypertension Adequate control on Prinzide/amlodipine, discussed decreasing caffeine exposure  3. Coronary artery disease involving native coronary artery of native heart without angina pectoris Stable, cont asa/statin  4. Hyperlipidemia,  unspecified hyperlipidemia type Begin Lipitor   5. CKD (chronic kidney disease) stage 3, GFR 30-59 ml/min Should improve off Mobic, adequate fluid intake discussed, recheck next visit  6. Depression, major, single episode, moderate (HCC) Improved on Zoloft, consider d/c after six months  Return in about 4 weeks (around 10/18/2016).  [name]. [facility]  09/20/2016

## 2016-10-13 ENCOUNTER — Telehealth: Payer: Self-pay

## 2016-10-13 ENCOUNTER — Other Ambulatory Visit: Payer: Self-pay | Admitting: Family Medicine

## 2016-10-13 MED ORDER — LISINOPRIL-HYDROCHLOROTHIAZIDE 20-12.5 MG PO TABS: 2.0000 | ORAL_TABLET | Freq: Every day | ORAL | 3 refills | Status: DC

## 2016-10-13 NOTE — Telephone Encounter (Signed)
Patient daughter called [name]) and said she said patient has bad headache out of Lisinopril and Tramadol. Advised we normally do not fill Tramadol but she said patient is in lots of pain and has history of pain issues.

## 2016-10-13 NOTE — Telephone Encounter (Signed)
Refilled lisinopril/HCTZ, should not be out of tramadol unless taking more than once daily, has appointment Monday.

## 2016-10-18 ENCOUNTER — Ambulatory Visit: Payer: Medicare (Managed Care) | Admitting: Family Medicine

## 2016-10-26 ENCOUNTER — Ambulatory Visit (INDEPENDENT_AMBULATORY_CARE_PROVIDER_SITE_OTHER): Payer: Medicare HMO | Admitting: Family Medicine

## 2016-10-26 ENCOUNTER — Encounter: Payer: Self-pay | Admitting: Family Medicine

## 2016-10-26 VITALS — BP 212/118 | HR 78 | Resp 16 | Ht 59.0 in | Wt 109.0 lb

## 2016-10-26 DIAGNOSIS — I10 Essential (primary) hypertension: Secondary | ICD-10-CM

## 2016-10-26 DIAGNOSIS — F321 Major depressive disorder, single episode, moderate: Secondary | ICD-10-CM

## 2016-10-26 DIAGNOSIS — I2510 Atherosclerotic heart disease of native coronary artery without angina pectoris: Secondary | ICD-10-CM

## 2016-10-26 DIAGNOSIS — E785 Hyperlipidemia, unspecified: Secondary | ICD-10-CM

## 2016-10-26 DIAGNOSIS — M419 Scoliosis, unspecified: Secondary | ICD-10-CM | POA: Diagnosis not present

## 2016-10-26 DIAGNOSIS — N183 Chronic kidney disease, stage 3 unspecified: Secondary | ICD-10-CM

## 2016-10-26 MED ORDER — TRAMADOL HCL 50 MG PO TABS: 50.0000 mg | ORAL_TABLET | Freq: Three times a day (TID) | ORAL | 0 refills | Status: DC | PRN

## 2016-10-26 MED ORDER — AMLODIPINE BESYLATE 10 MG PO TABS: 10.0000 mg | ORAL_TABLET | Freq: Every day | ORAL | 3 refills | Status: DC

## 2016-10-26 NOTE — Progress Notes (Signed)
Date:  10/26/2016   Name:  Vanessa Farmer   DOB:  10-Jul-1935   MRN:  [id number]  PCP:  Adline Potter, MD    Chief Complaint: Hypertension (bp high today went off Lisinopril fro 1-2 weeks and doubled up on amlodipine due to lost meds awaiting insurance. Now BP is over 200/120 but also having pain and headache. ); Hip Pain (right - needs tramadol refill ); and Headache   History of Present Illness:  This is a 81 y.o. female seen for one month f/u. Ran out of lisinopril/HCTZ, just restarted 2d ago. C/o worsened R hip pain, better with sitting, occ radiation to L hip, taking Tylenol bid, tramadol helped but ran out last week. Has cut back on caffeine, taking Lipitor, Zoloft helping mood. Daughter in hospital with cirrhosis, son seeing mental health. BLE edema improves overnight.  Review of Systems:  Review of Systems  Constitutional: Negative for chills and fever.  Respiratory: Negative for cough and shortness of breath.   Cardiovascular: Negative for chest pain.  Gastrointestinal: Negative for abdominal pain.  Genitourinary: Negative for difficulty urinating.  Neurological: Negative for syncope and light-headedness.    Patient Active Problem List   Diagnosis Date Noted  . CKD (chronic kidney disease) stage 3, GFR 30-59 ml/min 06/08/2016  . Hyperlipidemia 06/08/2016  . Scoliosis 06/07/2016  . Osteoporosis 06/07/2016  . Hypertension 06/07/2016  . CAD (coronary artery disease) 06/07/2016  . Depression, major, single episode, moderate ([place]) 06/07/2016    Prior to Admission medications   Medication Sig Start Date End Date Taking? Authorizing Provider  acetaminophen (TYLENOL) 500 MG tablet Take 2 tablets (1,000 mg total) by mouth 2 (two) times daily. 06/14/16  Yes Mikias Lanz, Gwyndolyn Saxon, MD  amLODipine (NORVASC) 10 MG tablet Take 1 tablet (10 mg total) by mouth daily. 10/26/16  Yes Kolleen Ochsner, Gwyndolyn Saxon, MD  aspirin 81 MG chewable tablet Chew 1 tablet by mouth daily.   Yes [provider]   atorvastatin (LIPITOR) 40 MG tablet Take 1 tablet (40 mg total) by mouth daily. 06/14/16  Yes Kirbi Farrugia, Gwyndolyn Saxon, MD  lisinopril-hydrochlorothiazide (PRINZIDE,ZESTORETIC) 20-12.5 MG tablet Take 2 tablets by mouth daily. 10/13/16  Yes Jillienne Egner, Gwyndolyn Saxon, MD  Multiple Vitamins-Calcium (ONE-A-DAY WOMENS PO) Take 1 tablet by mouth daily.   Yes [provider]  sertraline (ZOLOFT) 50 MG tablet Take 50 mg by mouth daily.   Yes [provider]  traMADol (ULTRAM) 50 MG tablet Take 1 tablet (50 mg total) by mouth every 8 (eight) hours as needed for severe pain. 10/26/16  Yes Yohannes Waibel, Gwyndolyn Saxon, MD    No Known Allergies  History reviewed. No pertinent surgical history.  Social History  Substance Use Topics  . Smoking status: Never Smoker  . Smokeless tobacco: Never Used  . Alcohol use No    History reviewed. No pertinent family history.  Medication list has been reviewed and updated.  Physical Examination: BP (!) 212/118   Pulse 78   Resp 16   Ht 4\' 11"  (1.499 m)   Wt 109 lb (49.4 kg)   SpO2 98%   BMI 22.02 kg/m   Physical Exam  Constitutional: She appears well-developed and well-nourished.  Cardiovascular: Normal rate, regular rhythm and normal heart sounds.   Pulmonary/Chest: Effort normal and breath sounds normal.  Musculoskeletal:  1+ BLE edema  Neurological: She is alert.  Skin: Skin is warm and dry.  Psychiatric: She has a normal mood and affect. Her behavior is normal.  Nursing note and vitals reviewed.   Assessment and Plan:  1. Essential hypertension Poor control likely due to missed med doses, hip pain, and family anxiety. Increase amlodipine to 10 mg daily and continue lisinopril/HCTZ - Comprehensive Metabolic Panel (CMET)  2. Coronary artery disease involving native coronary artery of native heart without angina pectoris Stable, cont asa/statin  3. Scoliosis of thoracolumbar spine, unspecified scoliosis type Cont Tylenol bid, refill tramadol q8h prn  4.  CKD (chronic kidney disease) stage 3, GFR 30-59 ml/min Recheck off Mobic  5. Depression, major, single episode, moderate (HCC) Improved on Zoloft, continue for now  6. Hyperlipidemia, unspecified hyperlipidemia type On Lipitor - Lipid Profile  Return in about 4 weeks (around 11/23/2016).  [name]. [facility]  10/26/2016

## 2016-10-26 NOTE — Patient Instructions (Addendum)
Increase amlodipine to 10 mg daily (may take two 5 mg tablets until old prescription runs out, new prescription sent). Continue lisinopril/HCTZ.

## 2016-10-27 LAB — COMPREHENSIVE METABOLIC PANEL
A/G RATIO: 1.8 (ref 1.2–2.2)
ALBUMIN: 4.2 g/dL (ref 3.5–4.8)
ALT: 15 IU/L (ref 0–32)
AST: 19 IU/L (ref 0–40)
Alkaline Phosphatase: 59 IU/L (ref 39–117)
BUN / CREAT RATIO: 15 (ref 12–28)
BUN: 16 mg/dL (ref 8–27)
Bilirubin Total: 0.3 mg/dL (ref 0.0–1.2)
CO2: 28 mmol/L (ref 20–29)
CREATININE: 1.04 mg/dL — AB (ref 0.57–1.00)
Calcium: 9.3 mg/dL (ref 8.7–10.3)
Chloride: 93 mmol/L — ABNORMAL LOW (ref 96–106)
GFR calc Af Amer: 59 mL/min/{1.73_m2} — ABNORMAL LOW (ref >59)
GFR calc non Af Amer: 51 mL/min/{1.73_m2} — ABNORMAL LOW (ref >59)
GLOBULIN, TOTAL: 2.4 g/dL (ref 1.5–4.5)
Glucose: 93 mg/dL (ref 65–99)
POTASSIUM: 3.9 mmol/L (ref 3.5–5.2)
SODIUM: 136 mmol/L (ref 134–144)
Total Protein: 6.6 g/dL (ref 6.0–8.5)

## 2016-10-27 LAB — LIPID PANEL
CHOL/HDL RATIO: 3.4 ratio (ref 0.0–4.4)
Cholesterol, Total: 181 mg/dL (ref 100–199)
HDL: 53 mg/dL (ref >39)
LDL CALC: 108 mg/dL — AB (ref 0–99)
TRIGLYCERIDES: 99 mg/dL (ref 0–149)
VLDL Cholesterol Cal: 20 mg/dL (ref 5–40)

## 2016-11-22 ENCOUNTER — Ambulatory Visit: Payer: Medicare HMO | Admitting: Family Medicine

## 2016-11-30 ENCOUNTER — Ambulatory Visit: Payer: Medicare HMO | Admitting: Family Medicine

## 2017-01-03 ENCOUNTER — Ambulatory Visit: Payer: Medicare HMO | Admitting: Family Medicine

## 2017-01-12 ENCOUNTER — Ambulatory Visit: Payer: Medicare HMO | Admitting: Family Medicine

## 2017-01-31 ENCOUNTER — Ambulatory Visit (INDEPENDENT_AMBULATORY_CARE_PROVIDER_SITE_OTHER): Payer: Medicare HMO | Admitting: Family Medicine

## 2017-01-31 ENCOUNTER — Encounter: Payer: Self-pay | Admitting: Family Medicine

## 2017-01-31 VITALS — BP 121/82 | HR 79 | Resp 16 | Ht 59.0 in | Wt 110.0 lb

## 2017-01-31 DIAGNOSIS — I10 Essential (primary) hypertension: Secondary | ICD-10-CM

## 2017-01-31 DIAGNOSIS — Z23 Encounter for immunization: Secondary | ICD-10-CM

## 2017-01-31 DIAGNOSIS — H547 Unspecified visual loss: Secondary | ICD-10-CM | POA: Diagnosis not present

## 2017-01-31 DIAGNOSIS — E785 Hyperlipidemia, unspecified: Secondary | ICD-10-CM

## 2017-01-31 DIAGNOSIS — I2510 Atherosclerotic heart disease of native coronary artery without angina pectoris: Secondary | ICD-10-CM

## 2017-01-31 DIAGNOSIS — F321 Major depressive disorder, single episode, moderate: Secondary | ICD-10-CM

## 2017-01-31 DIAGNOSIS — M419 Scoliosis, unspecified: Secondary | ICD-10-CM | POA: Diagnosis not present

## 2017-01-31 DIAGNOSIS — M810 Age-related osteoporosis without current pathological fracture: Secondary | ICD-10-CM

## 2017-01-31 DIAGNOSIS — N183 Chronic kidney disease, stage 3 unspecified: Secondary | ICD-10-CM

## 2017-01-31 NOTE — Patient Instructions (Signed)

## 2017-01-31 NOTE — Progress Notes (Signed)
Date:  01/31/2017   Name:  Vanessa Farmer   DOB:  06-30-1935   MRN:  [id number]  PCP:  Adline Potter, MD    Chief Complaint: Hypertension and Headache   History of Present Illness:  This is a 81 y.o. female seen for 3 month f/u. Amlodipine dose increased last visit. Daughter sick with cirrhosis but feels depression sxs stable on Zoloft. BLE edema worse last week but better today. Taking tramadol qod on average for hip pain. CKD improved last visit. C/o occ headaches, has not seen optho in 4 years. Some DOE but stable.  Review of Systems:  Review of Systems  Constitutional: Negative for chills and fever.  Eyes: Negative for pain.  Respiratory: Negative for cough.   Cardiovascular: Negative for chest pain.  Gastrointestinal: Negative for abdominal pain.  Genitourinary: Negative for difficulty urinating.  Neurological: Negative for syncope and light-headedness.    Patient Active Problem List   Diagnosis Date Noted  . CKD (chronic kidney disease) stage 3, GFR 30-59 ml/min 06/08/2016  . Hyperlipidemia 06/08/2016  . Scoliosis 06/07/2016  . Osteoporosis 06/07/2016  . Hypertension 06/07/2016  . CAD (coronary artery disease) 06/07/2016  . Depression, major, single episode, moderate ([place]) 06/07/2016    Prior to Admission medications   Medication Sig Start Date End Date Taking? Authorizing Provider  acetaminophen (TYLENOL) 500 MG tablet Take 2 tablets (1,000 mg total) by mouth 2 (two) times daily. 06/14/16  Yes Derriona Branscom, Gwyndolyn Saxon, MD  amLODipine (NORVASC) 10 MG tablet Take 1 tablet (10 mg total) by mouth daily. 10/26/16  Yes Kehinde Bowdish, Gwyndolyn Saxon, MD  aspirin 81 MG chewable tablet Chew 1 tablet by mouth daily.   Yes [provider]  atorvastatin (LIPITOR) 40 MG tablet Take 1 tablet (40 mg total) by mouth daily. 06/14/16  Yes Victorina Kable, Gwyndolyn Saxon, MD  lisinopril-hydrochlorothiazide (PRINZIDE,ZESTORETIC) 20-12.5 MG tablet Take 2 tablets by mouth daily. 10/13/16  Yes Zaylah Blecha, Gwyndolyn Saxon, MD  Multiple  Vitamins-Calcium (ONE-A-DAY WOMENS PO) Take 1 tablet by mouth daily.   Yes [provider]  sertraline (ZOLOFT) 50 MG tablet Take 50 mg by mouth daily.   Yes [provider]  traMADol (ULTRAM) 50 MG tablet Take 1 tablet (50 mg total) by mouth every 8 (eight) hours as needed for severe pain. 10/26/16  Yes Abdikadir Fohl, Gwyndolyn Saxon, MD    No Known Allergies  History reviewed. No pertinent surgical history.  Social History  Substance Use Topics  . Smoking status: Never Smoker  . Smokeless tobacco: Never Used  . Alcohol use No    Family History  Problem Relation Age of Onset  . Liver disease Daughter     Medication list has been reviewed and updated.  Physical Examination: BP 121/82   Pulse 79   Resp 16   Ht 4\' 11"  (1.499 m)   Wt 110 lb (49.9 kg)   SpO2 95%   BMI 22.22 kg/m   Physical Exam  Constitutional: She appears well-developed and well-nourished.  Cardiovascular: Normal rate, regular rhythm and normal heart sounds.   Pulmonary/Chest: Effort normal and breath sounds normal.  Musculoskeletal:  1+ BLE edema  Neurological: She is alert.  Skin: Skin is warm.  Psychiatric: She has a normal mood and affect. Her behavior is normal.  Nursing note and vitals reviewed.   Assessment and Plan:  1. Coronary artery disease involving native coronary artery of native heart without angina pectoris Stable clinically, cont asa/statin  2. Essential hypertension Improved on Prinzide, increased amlodipine  3. Scoliosis of thoracolumbar spine, unspecified scoliosis  type Stable on Tylenol bid and tramadol prn  4. CKD (chronic kidney disease) stage 3, GFR 30-59 ml/min Improved last visit  5. Depression, major, single episode, moderate (HCC) Stable on Zoloft with sign family stressors  6. Hyperlipidemia, unspecified hyperlipidemia type Improved control on Lipitor  7. Age-related osteoporosis without current pathological fracture Encouraged use of women's over 50 mvit  with 1000 units vit D  8. Decreased visual acuity - Ambulatory referral to Ophthalmology  9. Need for influenza vaccination - Flu vaccine HIGH DOSE PF (Fluzone High dose)  10. HM Plan Pneumovax, blood work next visit  Return in about 4 months (around 06/02/2017).  [name]. [facility]  01/31/2017

## 2017-05-17 ENCOUNTER — Telehealth: Payer: Self-pay

## 2017-05-17 ENCOUNTER — Other Ambulatory Visit: Payer: Self-pay | Admitting: Family Medicine

## 2017-05-17 MED ORDER — OSELTAMIVIR PHOSPHATE 75 MG PO CAPS: 75.0000 mg | ORAL_CAPSULE | Freq: Every day | ORAL | 0 refills | Status: DC

## 2017-05-17 NOTE — Telephone Encounter (Signed)
Tamiflu rx sent.

## 2017-05-17 NOTE — Telephone Encounter (Signed)
Family all Dx with flu. Pls send in Tamiflu for preventative. Went over the issue of her being exposed already and explained what to look for but she is very hands on with kids so they are scared. Send to [facility].

## 2017-05-30 ENCOUNTER — Ambulatory Visit: Payer: Medicare HMO

## 2017-06-01 ENCOUNTER — Ambulatory Visit: Payer: Medicare HMO

## 2017-06-01 ENCOUNTER — Ambulatory Visit (INDEPENDENT_AMBULATORY_CARE_PROVIDER_SITE_OTHER): Payer: Medicare HMO

## 2017-06-01 VITALS — BP 102/50 | HR 86 | Temp 99.1°F | Resp 12 | Ht 59.0 in | Wt 106.8 lb

## 2017-06-01 DIAGNOSIS — Z0000 Encounter for general adult medical examination without abnormal findings: Secondary | ICD-10-CM | POA: Diagnosis not present

## 2017-06-01 NOTE — Patient Instructions (Addendum)
Vanessa Farmer , Thank you for taking time to come for your Medicare Wellness Visit. I appreciate your ongoing commitment to your health goals. Please review the following plan we discussed and let me know if I can assist you in the future.   Screening recommendations/referrals: Colonoscopy: Colorectal screening no longer required. Mammogram: Breast cancer screening no longer required  Bone Density: Completed 05/10/12. Osteoporotic screening no longer required Recommended yearly ophthalmology/optometry visit for glaucoma screening and checkup Recommended yearly dental visit for hygiene and checkup  Vaccinations: Influenza vaccine: Up to date Pneumococcal vaccine: Due for PPSV23 after 06/07/17 Tdap vaccine: Up to date Shingles vaccine: Declined. Please call your insurance company to determine your out of pocket expense. You may also receive this vaccine at your local pharmacy or Health Dept.  Advanced directives: Advance directive discussed with you today. I have provided a copy for you to complete at home and have notarized. Once this is complete please bring a copy in to our office so we can scan it into your chart.  Conditions/risks identified: Recommend to drink at least 6-8 8oz glasses of water per day.  Next appointment: Please schedule your Annual Wellness Visit with your Nurse Health Advisor in one year.  Preventive Care 10 Years and Older, Female Preventive care refers to lifestyle choices and visits with your health care provider that can promote health and wellness. What does preventive care include?  A yearly physical exam. This is also called an annual well check.  Dental exams once or twice a year.  Routine eye exams. Ask your health care provider how often you should have your eyes checked.  Personal lifestyle choices, including:  Daily care of your teeth and gums.  Regular physical activity.  Eating a healthy diet.  Avoiding tobacco and drug use.  Limiting alcohol  use.  Practicing safe sex.  Taking low-dose aspirin every day.  Taking vitamin and mineral supplements as recommended by your health care provider. What happens during an annual well check? The services and screenings done by your health care provider during your annual well check will depend on your age, overall health, lifestyle risk factors, and family history of disease. Counseling  Your health care provider may ask you questions about your:  Alcohol use.  Tobacco use.  Drug use.  Emotional well-being.  Home and relationship well-being.  Sexual activity.  Eating habits.  History of falls.  Memory and ability to understand (cognition).  Work and work Statistician.  Reproductive health. Screening  You may have the following tests or measurements:  Height, weight, and BMI.  Blood pressure.  Lipid and cholesterol levels. These may be checked every 5 years, or more frequently if you are over 58 years old.  Skin check.  Lung cancer screening. You may have this screening every year starting at age 52 if you have a 30-pack-year history of smoking and currently smoke or have quit within the past 15 years.  Fecal occult blood test (FOBT) of the stool. You may have this test every year starting at age 110.  Flexible sigmoidoscopy or colonoscopy. You may have a sigmoidoscopy every 5 years or a colonoscopy every 10 years starting at age 38.  Hepatitis C blood test.  Hepatitis B blood test.  Sexually transmitted disease (STD) testing.  Diabetes screening. This is done by checking your blood sugar (glucose) after you have not eaten for a while (fasting). You may have this done every 1-3 years.  Bone density scan. This is done to screen  for osteoporosis. You may have this done starting at age 55.  Mammogram. This may be done every 1-2 years. Talk to your health care provider about how often you should have regular mammograms. Talk with your health care provider about  your test results, treatment options, and if necessary, the need for more tests. Vaccines  Your health care provider may recommend certain vaccines, such as:  Influenza vaccine. This is recommended every year.  Tetanus, diphtheria, and acellular pertussis (Tdap, Td) vaccine. You may need a Td booster every 10 years.  Zoster vaccine. You may need this after age 43.  Pneumococcal 13-valent conjugate (PCV13) vaccine. One dose is recommended after age 32.  Pneumococcal polysaccharide (PPSV23) vaccine. One dose is recommended after age 61. Talk to your health care provider about which screenings and vaccines you need and how often you need them. This information is not intended to replace advice given to you by your health care provider. Make sure you discuss any questions you have with your health care provider. Document Released: 05/23/2015 Document Revised: 01/14/2016 Document Reviewed: 02/25/2015 Elsevier Interactive Patient Education  2017 [place] Prevention in the Home Falls can cause injuries. They can happen to people of all ages. There are many things you can do to make your home safe and to help prevent falls. What can I do on the outside of my home?  Regularly fix the edges of walkways and driveways and fix any cracks.  Remove anything that might make you trip as you walk through a door, such as a raised step or threshold.  Trim any bushes or trees on the path to your home.  Use bright outdoor lighting.  Clear any walking paths of anything that might make someone trip, such as rocks or tools.  Regularly check to see if handrails are loose or broken. Make sure that both sides of any steps have handrails.  Any raised decks and porches should have guardrails on the edges.  Have any leaves, snow, or ice cleared regularly.  Use sand or salt on walking paths during winter.  Clean up any spills in your garage right away. This includes oil or grease spills. What  can I do in the bathroom?  Use night lights.  Install grab bars by the toilet and in the tub and shower. Do not use towel bars as grab bars.  Use non-skid mats or decals in the tub or shower.  If you need to sit down in the shower, use a plastic, non-slip stool.  Keep the floor dry. Clean up any water that spills on the floor as soon as it happens.  Remove soap buildup in the tub or shower regularly.  Attach bath mats securely with double-sided non-slip rug tape.  Do not have throw rugs and other things on the floor that can make you trip. What can I do in the bedroom?  Use night lights.  Make sure that you have a light by your bed that is easy to reach.  Do not use any sheets or blankets that are too big for your bed. They should not hang down onto the floor.  Have a firm chair that has side arms. You can use this for support while you get dressed.  Do not have throw rugs and other things on the floor that can make you trip. What can I do in the kitchen?  Clean up any spills right away.  Avoid walking on wet floors.  Keep items that you  use a lot in easy-to-reach places.  If you need to reach something above you, use a strong step stool that has a grab bar.  Keep electrical cords out of the way.  Do not use floor polish or wax that makes floors slippery. If you must use wax, use non-skid floor wax.  Do not have throw rugs and other things on the floor that can make you trip. What can I do with my stairs?  Do not leave any items on the stairs.  Make sure that there are handrails on both sides of the stairs and use them. Fix handrails that are broken or loose. Make sure that handrails are as long as the stairways.  Check any carpeting to make sure that it is firmly attached to the stairs. Fix any carpet that is loose or worn.  Avoid having throw rugs at the top or bottom of the stairs. If you do have throw rugs, attach them to the floor with carpet tape.  Make sure  that you have a light switch at the top of the stairs and the bottom of the stairs. If you do not have them, ask someone to add them for you. What else can I do to help prevent falls?  Wear shoes that:  Do not have high heels.  Have rubber bottoms.  Are comfortable and fit you well.  Are closed at the toe. Do not wear sandals.  If you use a stepladder:  Make sure that it is fully opened. Do not climb a closed stepladder.  Make sure that both sides of the stepladder are locked into place.  Ask someone to hold it for you, if possible.  Clearly mark and make sure that you can see:  Any grab bars or handrails.  First and last steps.  Where the edge of each step is.  Use tools that help you move around (mobility aids) if they are needed. These include:  Canes.  Walkers.  Scooters.  Crutches.  Turn on the lights when you go into a dark area. Replace any light bulbs as soon as they burn out.  Set up your furniture so you have a clear path. Avoid moving your furniture around.  If any of your floors are uneven, fix them.  If there are any pets around you, be aware of where they are.  Review your medicines with your doctor. Some medicines can make you feel dizzy. This can increase your chance of falling. Ask your doctor what other things that you can do to help prevent falls. This information is not intended to replace advice given to you by your health care provider. Make sure you discuss any questions you have with your health care provider. Document Released: 02/20/2009 Document Revised: 10/02/2015 Document Reviewed: 05/31/2014 Elsevier Interactive Patient Education  2017 [employer].

## 2017-06-01 NOTE — Progress Notes (Signed)
Subjective:   Vanessa Farmer is a 82 y.o. female who presents for an Initial Medicare Annual Wellness Visit.  Review of Systems    N/A  Cardiac Risk Factors include: advanced age (>55men, >14 women);dyslipidemia;hypertension;sedentary lifestyle     Objective:    Today's Vitals   06/01/17 1501  BP: (!) 102/50  Pulse: 86  Resp: 12  Temp: 99.1 F (37.3 C)  TempSrc: Oral  Weight: 106 lb 12.8 oz (48.4 kg)  Height: 4\' 11"  (1.499 m)   Body mass index is 21.57 kg/m.  Advanced Directives 06/01/2017 01/31/2017 09/20/2016 06/07/2016  Does Patient Have a Medical Advance Directive? No No No No  Would patient like information on creating a medical advance directive? Yes (MAU/Ambulatory/Procedural Areas - Information given) - - Yes (Inpatient - patient requests chaplain consult to create a medical advance directive)    Current Medications (verified) Outpatient Encounter Medications as of 06/01/2017  Medication Sig  . acetaminophen (TYLENOL) 500 MG tablet Take 2 tablets (1,000 mg total) by mouth 2 (two) times daily.  Marland Kitchen amLODipine (NORVASC) 10 MG tablet Take 1 tablet (10 mg total) by mouth daily.  Marland Kitchen aspirin 81 MG chewable tablet Chew 1 tablet by mouth daily.  Marland Kitchen lisinopril-hydrochlorothiazide (PRINZIDE,ZESTORETIC) 20-12.5 MG tablet Take 2 tablets by mouth daily.  . meloxicam (MOBIC) 15 MG tablet Take 1 tablet by mouth daily.  . Multiple Vitamins-Calcium (ONE-A-DAY WOMENS PO) Take 1 tablet by mouth daily.  . sertraline (ZOLOFT) 50 MG tablet Take 50 mg by mouth daily.  Marland Kitchen atorvastatin (LIPITOR) 40 MG tablet Take 1 tablet (40 mg total) by mouth daily. (Patient not taking: Reported on 06/01/2017)  . oseltamivir (TAMIFLU) 75 MG capsule Take 1 capsule (75 mg total) by mouth daily. (Patient not taking: Reported on 06/01/2017)  . traMADol (ULTRAM) 50 MG tablet Take 1 tablet (50 mg total) by mouth every 8 (eight) hours as needed for severe pain. (Patient not taking: Reported on 06/01/2017)   No  facility-administered encounter medications on file as of 06/01/2017.     Allergies (verified) Patient has no known allergies.   History: Past Medical History:  Diagnosis Date  . Fall   . Heart attack ([place])   . Hip fracture ([place])   . Hypertension    History reviewed. No pertinent surgical history. Family History  Problem Relation Age of Onset  . Heart disease Mother   . Cancer Mother   . Heart attack Father   . Liver disease Daughter   . Cancer Brother    Social History   Socioeconomic History  . Marital status: Widowed    Spouse name: None  . Number of children: 4  . Years of education: None  . Highest education level: 9th grade  Social Needs  . Financial resource strain: Not hard at all  . Food insecurity - worry: Never true  . Food insecurity - inability: Never true  . Transportation needs - medical: No  . Transportation needs - non-medical: No  Occupational History  . Occupation: Retired  Tobacco Use  . Smoking status: Never Smoker  . Smokeless tobacco: Never Used  . Tobacco comment: smoking cessation materials not required  Substance and Sexual Activity  . Alcohol use: No  . Drug use: No  . Sexual activity: Not Currently  Other Topics Concern  . None  Social History Narrative  . None    Tobacco Counseling Counseling given: No Comment: smoking cessation materials not required   Clinical Intake:  Pre-visit preparation completed: Yes  Pain : No/denies pain     BMI - recorded: 21.57 Nutritional Status: BMI of 19-24  Normal Nutritional Risks: None Diabetes: No  How often do you need to have someone help you when you read instructions, pamphlets, or other written materials from your doctor or pharmacy?: 1 - Never  Interpreter Needed?: No  Information entered by :: AEversole, LPN   Activities of Daily Living In your present state of health, do you have any difficulty performing the following activities: 06/01/2017 06/07/2016  Hearing? N N    Comment denies wearing hearing aids -  Vision? N Y  Comment wears eyeglasses NEEDS EYE DR   Difficulty concentrating or making decisions? N N  Walking or climbing stairs? N Y  Dressing or bathing? N N  Doing errands, shopping? N Y  Conservation officer, nature and eating ? N -  Comment denies wearing dentures -  Using the Toilet? N -  In the past six months, have you accidently leaked urine? N -  Do you have problems with loss of bowel control? N -  Managing your Medications? N -  Managing your Finances? N -  Housekeeping or managing your Housekeeping? N -     Immunizations and Health Maintenance Immunization History  Administered Date(s) Administered  . Influenza, High Dose Seasonal PF 01/31/2017  . Pneumococcal Conjugate-13 06/07/2016  . Tdap 06/07/2016   There are no preventive care reminders to display for this patient.  Patient Care Team: Adline Potter, MD as PCP - General (Family Medicine)  Indicate any recent Medical Services you may have received from other than [facility] providers in the past year (date may be approximate).     Assessment:   This is a routine wellness examination for Vanessa Farmer.  Hearing/Vision screen Vision Screening Comments: [place] for annual eye exams  Dietary issues and exercise activities discussed: Current Exercise Habits: The patient does not participate in regular exercise at present, Exercise limited by: None identified  Goals    . DIET - INCREASE WATER INTAKE     Recommend to drink at least 6-8 8oz glasses of water per day.      Depression Screen PHQ 2/9 Scores 06/01/2017 01/31/2017 10/26/2016 06/07/2016  PHQ - 2 Score 0 0 0 0  PHQ- 9 Score - 1 - -    Fall Risk Fall Risk  06/01/2017 10/26/2016 06/07/2016  Falls in the past year? No No Yes  Number falls in past yr: - - 1  Injury with Fall? - - Yes  Risk Factor Category  - - High Fall Risk  Risk for fall due to : History of fall(s);Impaired vision;Medication side effect - Impaired  balance/gait  Risk for fall due to: Comment wears eyeglasses - -    Is the patient's home free of loose throw rugs in walkways, pet beds, electrical cords, etc?   Yes Does the patient have any grab bars in the bathroom? No  Does the patient use a shower chair when bathing? No Does the patient have any stairs in or around the home? No If so, are there any handrails?  Yes Does the patient have adequate lighting?  Yes Does the patient use a cane, walker or w/c? No Does the patient use of an elevated toilet seat? No  Timed Get Up and Go Performed: Yes. Pt ambulated 10 feet within 9 sec. Gait slow, steady and without the use of an assistive device. No intervention required at this time. Fall risk prevention has been discussed.  Cognitive Function:     6CIT Screen 06/01/2017  What Year? 0 points  What month? 3 points  What time? 0 points  Count back from 20 0 points  Months in reverse 4 points  Repeat phrase 0 points  Total Score 7    Screening Tests Health Maintenance  Topic Date Due  . PNA vac Low Risk Adult (2 of 2 - PPSV23) 06/07/2017  . TETANUS/TDAP  06/07/2026  . INFLUENZA VACCINE  Completed  . DEXA SCAN  Completed    Qualifies for Shingles Vaccine? Yes. Due for Zostavax or Shingrix vaccine. Education has been provided regarding the importance of this vaccine. Pt has been advised to call her insurance company to determine her out of pocket expense. Advised she may also receive this vaccine at her local pharmacy or Health Dept. Verbalized acceptance and understanding.  Cancer Screenings: Lung: Low Dose CT Chest recommended if Age 6-80 years, 30 pack-year currently smoking OR have quit w/in 15years. Patient does not qualify. NON-SMOKER Breast: Up to date on Mammogram? No. Breast cancer screening no longer required  Up to date of Bone Density/Dexa? Yes. Completed 05/10/12. Osteoporotic screening no longer required Colorectal: Unable to locate recent colorectal screening results.  Colorectal screening no longer required.  Additional Screenings: Hepatitis B/HIV/Syphillis: Does not qualify Hepatitis C Screening: Does not qualify    Plan:  I have personally reviewed and addressed the Medicare Annual Wellness questionnaire and have noted the following in the patient's chart:  A. Medical and social history B. Use of alcohol, tobacco or illicit drugs  C. Current medications and supplements D. Functional ability and status E.  Nutritional status F.  Physical activity G. Advance directives H. List of other physicians I.  Hospitalizations, surgeries, and ER visits in previous 12 months J.  [place] such as hearing and vision if needed, cognitive and depression L. Referrals and appointments - none  In addition, I have reviewed and discussed with patient certain preventive protocols, quality metrics, and best practice recommendations. A written personalized care plan for preventive services as well as general preventive health recommendations were provided to patient.  Signed,  Aleatha Borer, LPN Nurse Health Advisor  MD Recommendations: Due for PPSV23 after 06/07/17

## 2017-06-06 ENCOUNTER — Ambulatory Visit: Payer: Medicare HMO | Admitting: Family Medicine

## 2017-06-10 ENCOUNTER — Encounter: Payer: Self-pay | Admitting: Family Medicine

## 2017-06-10 ENCOUNTER — Ambulatory Visit (INDEPENDENT_AMBULATORY_CARE_PROVIDER_SITE_OTHER): Payer: Medicare HMO | Admitting: Family Medicine

## 2017-06-10 VITALS — BP 118/74 | HR 78 | Resp 16 | Ht 59.0 in | Wt 104.0 lb

## 2017-06-10 DIAGNOSIS — I10 Essential (primary) hypertension: Secondary | ICD-10-CM | POA: Diagnosis not present

## 2017-06-10 DIAGNOSIS — N183 Chronic kidney disease, stage 3 unspecified: Secondary | ICD-10-CM

## 2017-06-10 DIAGNOSIS — M419 Scoliosis, unspecified: Secondary | ICD-10-CM

## 2017-06-10 DIAGNOSIS — E785 Hyperlipidemia, unspecified: Secondary | ICD-10-CM

## 2017-06-10 DIAGNOSIS — F321 Major depressive disorder, single episode, moderate: Secondary | ICD-10-CM | POA: Diagnosis not present

## 2017-06-10 DIAGNOSIS — M810 Age-related osteoporosis without current pathological fracture: Secondary | ICD-10-CM | POA: Diagnosis not present

## 2017-06-10 DIAGNOSIS — I2510 Atherosclerotic heart disease of native coronary artery without angina pectoris: Secondary | ICD-10-CM

## 2017-06-10 DIAGNOSIS — K219 Gastro-esophageal reflux disease without esophagitis: Secondary | ICD-10-CM | POA: Insufficient documentation

## 2017-06-10 MED ORDER — ATORVASTATIN CALCIUM 40 MG PO TABS: 40.0000 mg | ORAL_TABLET | Freq: Every day | ORAL | 3 refills | Status: DC

## 2017-06-10 MED ORDER — RANITIDINE HCL 150 MG PO TABS: 150.0000 mg | ORAL_TABLET | Freq: Two times a day (BID) | ORAL | 2 refills | Status: DC | PRN

## 2017-06-10 NOTE — Progress Notes (Addendum)
Date:  06/10/2017   Name:  Vanessa Farmer   DOB:  1936-01-14   MRN:  [id number]  PCP:  Adline Potter, MD    Chief Complaint: Hypertension   History of Present Illness:  This is a 82 y.o. female seen for four month f/u. Feeling well except having some heartburn during day and at night. Stopped Lipitor when rx ran out. Mood stable. Back pain ok on Tylenol bid.  Review of Systems:  Review of Systems  Constitutional: Negative for chills and fever.  Respiratory: Negative for cough and shortness of breath.   Cardiovascular: Negative for chest pain and leg swelling.  Genitourinary: Negative for difficulty urinating.  Neurological: Negative for syncope and light-headedness.    Patient Active Problem List   Diagnosis Date Noted  . CKD (chronic kidney disease) stage 3, GFR 30-59 ml/min (HCC) 06/08/2016  . Hyperlipidemia 06/08/2016  . Scoliosis 06/07/2016  . Osteoporosis 06/07/2016  . Hypertension 06/07/2016  . CAD (coronary artery disease) 06/07/2016  . Depression, major, single episode, moderate ([place]) 06/07/2016    Prior to Admission medications   Medication Sig Start Date End Date Taking? Authorizing Provider  acetaminophen (TYLENOL) 500 MG tablet Take 2 tablets (1,000 mg total) by mouth 2 (two) times daily. 06/14/16  Yes Barri Neidlinger, Gwyndolyn Saxon, MD  amLODipine (NORVASC) 10 MG tablet Take 1 tablet (10 mg total) by mouth daily. 10/26/16  Yes Dalisa Forrer, Gwyndolyn Saxon, MD  aspirin 81 MG chewable tablet Chew 1 tablet by mouth daily.   Yes [provider]  lisinopril-hydrochlorothiazide (PRINZIDE,ZESTORETIC) 20-12.5 MG tablet Take 2 tablets by mouth daily. 10/13/16  Yes Gotti Alwin, Gwyndolyn Saxon, MD  Multiple Vitamins-Calcium (ONE-A-DAY WOMENS PO) Take 1 tablet by mouth daily.   Yes [provider]  sertraline (ZOLOFT) 50 MG tablet Take 50 mg by mouth daily.   Yes [provider]  atorvastatin (LIPITOR) 40 MG tablet Take 1 tablet (40 mg total) by mouth daily. 06/10/17   [place] Valli, Gwyndolyn Saxon, MD  ranitidine  (ZANTAC) 150 MG tablet Take 1 tablet (150 mg total) by mouth 2 (two) times daily as needed for heartburn. 06/10/17   Adline Potter, MD    No Known Allergies  History reviewed. No pertinent surgical history.  Social History   Tobacco Use  . Smoking status: Never Smoker  . Smokeless tobacco: Never Used  . Tobacco comment: smoking cessation materials not required  Substance Use Topics  . Alcohol use: No  . Drug use: No    Family History  Problem Relation Age of Onset  . Heart disease Mother   . Cancer Mother   . Heart attack Father   . Liver disease Daughter   . Cancer Brother     Medication list has been reviewed and updated.  Physical Examination: BP 118/74   Pulse 78   Resp 16   Ht 4\' 11"  (1.499 m)   Wt 104 lb (47.2 kg)   SpO2 98%   BMI 21.01 kg/m   Physical Exam  Constitutional: She appears well-developed and well-nourished.  Cardiovascular: Normal rate, regular rhythm and normal heart sounds.  Pulmonary/Chest: Effort normal and breath sounds normal.  Musculoskeletal: She exhibits no edema.  Neurological: She is alert.  Skin: Skin is warm and dry.  Psychiatric: She has a normal mood and affect. Her behavior is normal.  Nursing note and vitals reviewed.   Assessment and Plan:  1. Depression, major, single episode, moderate ([place]) Well controlled on Zoloft, continue  2. CKD (chronic kidney disease) stage 3, GFR 30-59 ml/min (HCC) -  Basic Metabolic Panel (BMET)  3. Coronary artery disease involving native coronary artery of native heart without angina pectoris Stable on ACEI/asa, restart statin  4. Essential hypertension Well controlled on amlodipine/Prinzide, consider d/c HCTZ next refill  5. Hyperlipidemia, unspecified hyperlipidemia type Restart Lipitor 40 mg daily  6. Scoliosis of thoracolumbar spine, unspecified scoliosis type Well controlled on Tylenol bid  7. Age-related osteoporosis without current pathological fracture - Vitamin D (25  hydroxy) - TSH  8. Gastroesophageal reflux disease, esophagitis presence not specified Trial Zantac 150 mg bid prn, call if ineffective  9. HM Needs Pneumovax next visit  Return in about 6 months (around 12/08/2017).  [name]. [facility]  06/10/2017

## 2017-06-11 LAB — BASIC METABOLIC PANEL
BUN/Creatinine Ratio: 16 (ref 12–28)
BUN: 18 mg/dL (ref 8–27)
CALCIUM: 9.3 mg/dL (ref 8.7–10.3)
CO2: 27 mmol/L (ref 20–29)
Chloride: 96 mmol/L (ref 96–106)
Creatinine, Ser: 1.12 mg/dL — ABNORMAL HIGH (ref 0.57–1.00)
GFR calc Af Amer: 54 mL/min/{1.73_m2} — ABNORMAL LOW (ref >59)
GFR, EST NON AFRICAN AMERICAN: 47 mL/min/{1.73_m2} — AB (ref >59)
GLUCOSE: 96 mg/dL (ref 65–99)
POTASSIUM: 3.9 mmol/L (ref 3.5–5.2)
SODIUM: 138 mmol/L (ref 134–144)

## 2017-06-11 LAB — VITAMIN D 25 HYDROXY (VIT D DEFICIENCY, FRACTURES): Vit D, 25-Hydroxy: 43.1 ng/mL (ref 30.0–100.0)

## 2017-06-11 LAB — TSH: TSH: 1.24 u[IU]/mL (ref 0.450–4.500)

## 2017-08-10 ENCOUNTER — Other Ambulatory Visit: Payer: Self-pay | Admitting: Family Medicine

## 2017-08-10 MED ORDER — RANITIDINE HCL 150 MG PO TABS: 150.0000 mg | ORAL_TABLET | Freq: Two times a day (BID) | ORAL | 2 refills | Status: DC | PRN

## 2017-08-25 ENCOUNTER — Ambulatory Visit: Payer: Medicaid Other | Admitting: Internal Medicine

## 2017-09-22 ENCOUNTER — Ambulatory Visit: Payer: Medicaid Other | Admitting: Internal Medicine

## 2017-09-29 ENCOUNTER — Other Ambulatory Visit
Admission: RE | Admit: 2017-09-29 | Discharge: 2017-09-29 | Disposition: A | Discharge status: Home / Self Care | Payer: Medicare Other | Source: Ambulatory Visit | Attending: Internal Medicine | Admitting: Internal Medicine

## 2017-09-29 ENCOUNTER — Ambulatory Visit (INDEPENDENT_AMBULATORY_CARE_PROVIDER_SITE_OTHER): Payer: Medicare Other | Admitting: Internal Medicine

## 2017-09-29 ENCOUNTER — Encounter: Payer: Self-pay | Admitting: Internal Medicine

## 2017-09-29 ENCOUNTER — Other Ambulatory Visit: Payer: Self-pay

## 2017-09-29 VITALS — BP 118/68 | HR 74 | Resp 16 | Ht 59.0 in | Wt 101.0 lb

## 2017-09-29 DIAGNOSIS — M419 Scoliosis, unspecified: Secondary | ICD-10-CM | POA: Diagnosis not present

## 2017-09-29 DIAGNOSIS — K219 Gastro-esophageal reflux disease without esophagitis: Secondary | ICD-10-CM | POA: Diagnosis not present

## 2017-09-29 DIAGNOSIS — I2510 Atherosclerotic heart disease of native coronary artery without angina pectoris: Secondary | ICD-10-CM

## 2017-09-29 DIAGNOSIS — I10 Essential (primary) hypertension: Secondary | ICD-10-CM | POA: Insufficient documentation

## 2017-09-29 DIAGNOSIS — F321 Major depressive disorder, single episode, moderate: Secondary | ICD-10-CM | POA: Diagnosis not present

## 2017-09-29 LAB — CBC WITH DIFFERENTIAL/PLATELET
BASOS ABS: 0 10*3/uL (ref 0–0.1)
Basophils Relative: 1 %
Eosinophils Absolute: 0.1 10*3/uL (ref 0–0.7)
Eosinophils Relative: 4 %
HEMATOCRIT: 32.6 % — AB (ref 35.0–47.0)
HEMOGLOBIN: 10.9 g/dL — AB (ref 12.0–16.0)
LYMPHS ABS: 0.7 10*3/uL — AB (ref 1.0–3.6)
LYMPHS PCT: 28 %
MCH: 31.4 pg (ref 26.0–34.0)
MCHC: 33.5 g/dL (ref 32.0–36.0)
MCV: 93.8 fL (ref 80.0–100.0)
Monocytes Absolute: 0.4 10*3/uL (ref 0.2–0.9)
Monocytes Relative: 18 %
NEUTROS ABS: 1.1 10*3/uL — AB (ref 1.4–6.5)
NEUTROS PCT: 49 %
Platelets: 146 10*3/uL — ABNORMAL LOW (ref 150–440)
RBC: 3.47 MIL/uL — AB (ref 3.80–5.20)
RDW: 15.1 % — ABNORMAL HIGH (ref 11.5–14.5)
WBC: 2.3 10*3/uL — AB (ref 3.6–11.0)

## 2017-09-29 LAB — COMPREHENSIVE METABOLIC PANEL
ALK PHOS: 66 U/L (ref 38–126)
ALT: 22 U/L (ref 14–54)
AST: 26 U/L (ref 15–41)
Albumin: 4.1 g/dL (ref 3.5–5.0)
Anion gap: 10 (ref 5–15)
BUN: 29 mg/dL — AB (ref 6–20)
CALCIUM: 8.7 mg/dL — AB (ref 8.9–10.3)
CO2: 28 mmol/L (ref 22–32)
CREATININE: 1.34 mg/dL — AB (ref 0.44–1.00)
Chloride: 100 mmol/L — ABNORMAL LOW (ref 101–111)
GFR calc non Af Amer: 36 mL/min — ABNORMAL LOW (ref >60)
GFR, EST AFRICAN AMERICAN: 42 mL/min — AB (ref >60)
Glucose, Bld: 96 mg/dL (ref 65–99)
Potassium: 3.7 mmol/L (ref 3.5–5.1)
Sodium: 138 mmol/L (ref 135–145)
Total Bilirubin: 0.6 mg/dL (ref 0.3–1.2)
Total Protein: 7.3 g/dL (ref 6.5–8.1)

## 2017-09-29 LAB — TSH: TSH: 0.863 u[IU]/mL (ref 0.350–4.500)

## 2017-09-29 MED ORDER — LISINOPRIL-HYDROCHLOROTHIAZIDE 20-12.5 MG PO TABS: 2.0000 | ORAL_TABLET | Freq: Every day | ORAL | 5 refills | Status: DC

## 2017-09-29 MED ORDER — SERTRALINE HCL 50 MG PO TABS: 50.0000 mg | ORAL_TABLET | Freq: Every day | ORAL | 5 refills | Status: DC

## 2017-09-29 MED ORDER — RANITIDINE HCL 150 MG PO TABS: 150.0000 mg | ORAL_TABLET | Freq: Two times a day (BID) | ORAL | 5 refills | Status: DC | PRN

## 2017-09-29 MED ORDER — AMLODIPINE BESYLATE 10 MG PO TABS: 10.0000 mg | ORAL_TABLET | Freq: Every day | ORAL | 5 refills | Status: DC

## 2017-09-29 NOTE — Progress Notes (Signed)
Date:  09/29/2017   Name:  Vanessa Farmer   DOB:  Jul 23, 1935   MRN:  [id number]  Pt moved here from [place] last year.  Was living with her daughter Vanessa Farmer, now living with her granddaughter Vanessa Farmer.  Chief Complaint: Back Pain (saw specialist in past in [place] ); Hip Pain; and Depression ([facility] was writing Zoloft ran out of meds 2 weeks ago  and granddaughter notices changes )  Back Pain  This is a chronic problem. The problem occurs constantly. The problem is unchanged. The pain is present in the lumbar spine and sacro-iliac. The pain is moderate. Pertinent negatives include no abdominal pain, chest pain, fever or headaches. Risk factors: severe scoliosis. She has tried NSAIDs for the symptoms.  Depression         This is a chronic problem.  The problem occurs daily.  The problem has been gradually worsening since onset.  Associated symptoms include no fatigue, no headaches and no suicidal ideas.  Past treatments include SSRIs - Selective serotonin reuptake inhibitors and other medications (ran out of sertraline at least 2 weeks ago). Gastroesophageal Reflux  She complains of heartburn. She reports no abdominal pain, no chest pain, no coughing or no wheezing. This is a recurrent problem. Pertinent negatives include no fatigue. She has tried a histamine-2 antagonist (prscribed zantac bid but only taking it once a day) for the symptoms.  Hypertension  This is a chronic problem. The problem is controlled. Associated symptoms include shortness of breath. Pertinent negatives include no chest pain, headaches or palpitations. Past treatments include diuretics, calcium channel blockers and ACE inhibitors. The current treatment provides significant improvement. Hypertensive end-organ damage includes CAD/MI (has had 2 mild MIs).   She had minimal back pain until 18 months ago when she fell at home.  She hurt her left hip and low back.  She was treated with mobic by her PCP in [place].  She gradually got  better but now the hip and back are hurting more.   The scoliosis may be worsening since she has more trouble keeping her skin folds dry. She has lost about 12 lbs in the past 18 months - she claims to be eating but has very poor dentition.  CAPs is starting with her and she will be getting Ensure.   Review of Systems  Constitutional: Positive for unexpected weight change. Negative for chills, fatigue and fever.  Respiratory: Positive for shortness of breath. Negative for cough, chest tightness and wheezing.   Cardiovascular: Negative for chest pain, palpitations and leg swelling.  Gastrointestinal: Positive for heartburn. Negative for abdominal pain, constipation and diarrhea.  Genitourinary: Negative for difficulty urinating.  Musculoskeletal: Positive for arthralgias, back pain and gait problem (uses a cane to walk).  Skin: Positive for rash (under breasts).  Neurological: Negative for dizziness and headaches.  Psychiatric/Behavioral: Positive for depression and dysphoric mood. Negative for confusion, sleep disturbance and suicidal ideas. The patient is not nervous/anxious.     Patient Active Problem List   Diagnosis Date Noted  . GERD (gastroesophageal reflux disease) 06/10/2017  . CKD (chronic kidney disease) stage 3, GFR 30-59 ml/min (HCC) 06/08/2016  . Hyperlipidemia 06/08/2016  . Scoliosis 06/07/2016  . Osteoporosis 06/07/2016  . Hypertension 06/07/2016  . CAD (coronary artery disease) 06/07/2016  . Depression, major, single episode, moderate ([place]) 06/07/2016    Prior to Admission medications   Medication Sig Start Date End Date Taking? Authorizing Provider  acetaminophen (TYLENOL) 500 MG tablet Take 2 tablets (  1,000 mg total) by mouth 2 (two) times daily. 06/14/16  Yes Plonk, Gwyndolyn Saxon, MD  amLODipine (NORVASC) 10 MG tablet Take 1 tablet (10 mg total) by mouth daily. 10/26/16  Yes Plonk, Gwyndolyn Saxon, MD  aspirin 325 MG tablet Take 325 mg by mouth daily.   Yes [provider]   atorvastatin (LIPITOR) 40 MG tablet Take 1 tablet (40 mg total) by mouth daily. 06/10/17  Yes Plonk, Gwyndolyn Saxon, MD  lisinopril-hydrochlorothiazide (PRINZIDE,ZESTORETIC) 20-12.5 MG tablet Take 2 tablets by mouth daily. 10/13/16  Yes Plonk, Gwyndolyn Saxon, MD  Multiple Vitamins-Calcium (ONE-A-DAY WOMENS PO) Take 1 tablet by mouth daily.   Yes [provider]  ranitidine (ZANTAC) 150 MG tablet Take 1 tablet (150 mg total) by mouth 2 (two) times daily as needed for heartburn. 08/10/17  Yes Plonk, Gwyndolyn Saxon, MD  sertraline (ZOLOFT) 50 MG tablet Take 50 mg by mouth daily.   Yes [provider]    No Known Allergies  History reviewed. No pertinent surgical history.  Social History   Tobacco Use  . Smoking status: Never Smoker  . Smokeless tobacco: Never Used  . Tobacco comment: smoking cessation materials not required  Substance Use Topics  . Alcohol use: No  . Drug use: No     Medication list has been reviewed and updated.  Current Meds  Medication Sig  . acetaminophen (TYLENOL) 500 MG tablet Take 2 tablets (1,000 mg total) by mouth 2 (two) times daily.  Marland Kitchen amLODipine (NORVASC) 10 MG tablet Take 1 tablet (10 mg total) by mouth daily.  Marland Kitchen aspirin 325 MG tablet Take 325 mg by mouth daily.  Marland Kitchen atorvastatin (LIPITOR) 40 MG tablet Take 1 tablet (40 mg total) by mouth daily.  Marland Kitchen lisinopril-hydrochlorothiazide (PRINZIDE,ZESTORETIC) 20-12.5 MG tablet Take 2 tablets by mouth daily.  . Multiple Vitamins-Calcium (ONE-A-DAY WOMENS PO) Take 1 tablet by mouth daily.  . ranitidine (ZANTAC) 150 MG tablet Take 1 tablet (150 mg total) by mouth 2 (two) times daily as needed for heartburn.  . sertraline (ZOLOFT) 50 MG tablet Take 50 mg by mouth daily.    PHQ 2/9 Scores 09/29/2017 06/01/2017 01/31/2017 10/26/2016  PHQ - 2 Score 5 0 0 0  PHQ- 9 Score 9 - 1 -    Physical Exam  Constitutional: She is oriented to person, place, and time. She appears well-developed. No distress.  HENT:  Head: Normocephalic  and atraumatic.  edentulous on top Few broken decayed teeth on bottom front  Neck: Normal range of motion. Neck supple.  Cardiovascular: Normal rate, regular rhythm and normal heart sounds.  Pulmonary/Chest: Effort normal and breath sounds normal. No respiratory distress.  Musculoskeletal:       Right hip: She exhibits normal range of motion and no tenderness.       Left hip: She exhibits normal range of motion and no tenderness.       Lumbar back: She exhibits tenderness (over SI region).  Severe scoliosis of thoracic and lumbar spine Tender over bony spine upper lumbar  Neurological: She is alert and oriented to person, place, and time. She has normal strength. No sensory deficit.  Skin: Skin is warm and dry. No rash noted.  Psychiatric: She has a normal mood and affect. Her behavior is normal. Thought content normal.  Nursing note and vitals reviewed.   BP 118/68   Pulse 74   Resp 16   Ht 4\' 11"  (1.499 m)   Wt 101 lb (45.8 kg)   SpO2 97%   BMI 20.40 kg/m  Assessment and Plan: 1. Scoliosis of thoracolumbar spine, unspecified scoliosis type Need Ortho evaluation - Ambulatory referral to Orthopedic Surgery  2. Essential hypertension Controlled on current regimen - lisinopril-hydrochlorothiazide (PRINZIDE,ZESTORETIC) 20-12.5 MG tablet; Take 2 tablets by mouth daily.  Dispense: 60 tablet; Refill: 5 - amLODipine (NORVASC) 10 MG tablet; Take 1 tablet (10 mg total) by mouth daily.  Dispense: 30 tablet; Refill: 5 - CBC with Differential/Platelet - Comprehensive metabolic panel - TSH  3. Gastroesophageal reflux disease, esophagitis presence not specified Increase to bid dosing - ranitidine (ZANTAC) 150 MG tablet; Take 1 tablet (150 mg total) by mouth 2 (two) times daily as needed for heartburn.  Dispense: 60 tablet; Refill: 5  4. Depression, major, single episode, moderate (HCC) Resume sertraline Should also help with appetitie - sertraline (ZOLOFT) 50 MG tablet; Take 1  tablet (50 mg total) by mouth daily.  Dispense: 30 tablet; Refill: 5   Meds ordered this encounter  Medications  . sertraline (ZOLOFT) 50 MG tablet    Sig: Take 1 tablet (50 mg total) by mouth daily.    Dispense:  30 tablet    Refill:  5  . lisinopril-hydrochlorothiazide (PRINZIDE,ZESTORETIC) 20-12.5 MG tablet    Sig: Take 2 tablets by mouth daily.    Dispense:  60 tablet    Refill:  5  . ranitidine (ZANTAC) 150 MG tablet    Sig: Take 1 tablet (150 mg total) by mouth 2 (two) times daily as needed for heartburn.    Dispense:  60 tablet    Refill:  5  . amLODipine (NORVASC) 10 MG tablet    Sig: Take 1 tablet (10 mg total) by mouth daily.    Dispense:  30 tablet    Refill:  5    Partially dictated using Editor, commissioning. Any errors are unintentional.  Halina Maidens, MD [facility]  09/29/2017

## 2017-09-30 ENCOUNTER — Encounter: Payer: Self-pay | Admitting: Internal Medicine

## 2017-09-30 ENCOUNTER — Other Ambulatory Visit: Payer: Self-pay | Admitting: Internal Medicine

## 2017-09-30 DIAGNOSIS — D61818 Other pancytopenia: Secondary | ICD-10-CM | POA: Insufficient documentation

## 2017-10-06 ENCOUNTER — Encounter: Payer: Self-pay | Admitting: Oncology

## 2017-10-06 ENCOUNTER — Inpatient Hospital Stay: Payer: Medicare Other

## 2017-10-06 ENCOUNTER — Inpatient Hospital Stay: Payer: Medicare Other | Attending: Oncology | Admitting: Oncology

## 2017-10-06 ENCOUNTER — Other Ambulatory Visit: Payer: Self-pay

## 2017-10-06 VITALS — BP 114/62 | HR 60 | Temp 96.7°F | Resp 18 | Ht 60.0 in | Wt 101.7 lb

## 2017-10-06 DIAGNOSIS — D696 Thrombocytopenia, unspecified: Secondary | ICD-10-CM

## 2017-10-06 DIAGNOSIS — D61818 Other pancytopenia: Secondary | ICD-10-CM | POA: Insufficient documentation

## 2017-10-06 DIAGNOSIS — Z808 Family history of malignant neoplasm of other organs or systems: Secondary | ICD-10-CM | POA: Insufficient documentation

## 2017-10-06 DIAGNOSIS — D709 Neutropenia, unspecified: Secondary | ICD-10-CM | POA: Diagnosis not present

## 2017-10-06 DIAGNOSIS — D649 Anemia, unspecified: Secondary | ICD-10-CM | POA: Diagnosis not present

## 2017-10-06 DIAGNOSIS — Z809 Family history of malignant neoplasm, unspecified: Secondary | ICD-10-CM | POA: Diagnosis not present

## 2017-10-06 DIAGNOSIS — R634 Abnormal weight loss: Secondary | ICD-10-CM | POA: Diagnosis not present

## 2017-10-06 DIAGNOSIS — Z79899 Other long term (current) drug therapy: Secondary | ICD-10-CM | POA: Insufficient documentation

## 2017-10-06 LAB — IRON AND TIBC
Iron: 34 ug/dL (ref 28–170)
Saturation Ratios: 10 % — ABNORMAL LOW (ref 10.4–31.8)
TIBC: 354 ug/dL (ref 250–450)
UIBC: 320 ug/dL

## 2017-10-06 LAB — CBC WITH DIFFERENTIAL/PLATELET
Basophils Absolute: 0 10*3/uL (ref 0–0.1)
Basophils Relative: 1 %
Eosinophils Absolute: 0.2 10*3/uL (ref 0–0.7)
Eosinophils Relative: 4 %
HEMATOCRIT: 32.8 % — AB (ref 35.0–47.0)
HEMOGLOBIN: 10.9 g/dL — AB (ref 12.0–16.0)
LYMPHS ABS: 0.8 10*3/uL — AB (ref 1.0–3.6)
LYMPHS PCT: 20 %
MCH: 31.2 pg (ref 26.0–34.0)
MCHC: 33.3 g/dL (ref 32.0–36.0)
MCV: 93.9 fL (ref 80.0–100.0)
MONO ABS: 0.4 10*3/uL (ref 0.2–0.9)
MONOS PCT: 9 %
NEUTROS ABS: 2.9 10*3/uL (ref 1.4–6.5)
Neutrophils Relative %: 66 %
Platelets: 152 10*3/uL (ref 150–440)
RBC: 3.49 MIL/uL — ABNORMAL LOW (ref 3.80–5.20)
RDW: 14.6 % — AB (ref 11.5–14.5)
WBC: 4.3 10*3/uL (ref 3.6–11.0)

## 2017-10-06 LAB — FOLATE: Folate: 45 ng/mL (ref >5.9)

## 2017-10-06 LAB — TECHNOLOGIST SMEAR REVIEW

## 2017-10-06 LAB — FERRITIN: Ferritin: 29 ng/mL (ref 11–307)

## 2017-10-06 LAB — VITAMIN B12: Vitamin B-12: 744 pg/mL (ref 180–914)

## 2017-10-06 LAB — LACTATE DEHYDROGENASE: LDH: 160 U/L (ref 98–192)

## 2017-10-06 NOTE — Progress Notes (Signed)
Patient here today as a new patient  

## 2017-10-06 NOTE — Progress Notes (Signed)
Hematology/Oncology Consult note [facility] Telephone:([phone] Fax:([phone]) [phone]   Patient Care Team: Glean Hess, MD as PCP - General (Internal Medicine)  REFERRING PROVIDER: Glean Hess, MD CHIEF COMPLAINTS/PURPOSE OF CONSULTATION:  Evaluation of pancytopenia  HISTORY OF PRESENTING ILLNESS:  Vanessa Farmer is a  82 y.o.  female with PMH listed below who was referred to me for evaluation of pancytopenia.  Patient recently had lab work done which revealed pancytopenia with platelet counts 146,000, Hemoglobin 10.9, WBC 2.3, ANC 1.1 Patient reports  fatigue, denies weight loss, easy bruising, hematochezia, hemoptysis Patient denies fatigue, weight loss, easy bruising, hematochezia, hemoptysis.  History of DVT: Denies     Review of Systems  Constitutional: Positive for weight loss. Negative for chills, fever and malaise/fatigue.  HENT: Negative for congestion, ear discharge, ear pain, nosebleeds, sinus pain and sore throat.   Eyes: Negative for double vision, photophobia, pain, discharge and redness.  Respiratory: Negative for cough, hemoptysis, sputum production, shortness of breath and wheezing.   Cardiovascular: Negative for chest pain, palpitations, orthopnea, claudication and leg swelling.  Gastrointestinal: Negative for abdominal pain, blood in stool, constipation, diarrhea, heartburn, melena, nausea and vomiting.  Genitourinary: Negative for dysuria, flank pain, frequency and hematuria.  Musculoskeletal: Negative for back pain, myalgias and neck pain.  Skin: Negative for itching and rash.  Neurological: Negative for dizziness, tingling, tremors, focal weakness, weakness and headaches.  Endo/Heme/Allergies: Negative for environmental allergies. Does not bruise/bleed easily.  Psychiatric/Behavioral: Negative for depression and hallucinations. The patient is not nervous/anxious.     MEDICAL HISTORY:  Past Medical History:  Diagnosis  Date  . Cataract   . Fall   . Hypertension     SURGICAL HISTORY: Past Surgical History:  Procedure Laterality Date  . TONSILLECTOMY      SOCIAL HISTORY: Social History   Socioeconomic History  . Marital status: Widowed    Spouse name: Not on file  . Number of children: 4  . Years of education: Not on file  . Highest education level: 9th grade  Occupational History  . Occupation: Retired  Scientific laboratory technician  . Financial resource strain: Not hard at all  . Food insecurity:    Worry: Never true    Inability: Never true  . Transportation needs:    Medical: No    Non-medical: No  Tobacco Use  . Smoking status: Never Smoker  . Smokeless tobacco: Never Used  . Tobacco comment: smoking cessation materials not required  Substance and Sexual Activity  . Alcohol use: No  . Drug use: No  . Sexual activity: Not Currently  Lifestyle  . Physical activity:    Days per week: 0 days    Minutes per session: 0 min  . Stress: Not at all  Relationships  . Social connections:    Talks on phone: Patient refused    Gets together: Patient refused    Attends religious service: Patient refused    Active member of club or organization: Patient refused    Attends meetings of clubs or organizations: Patient refused    Relationship status: Widowed  . Intimate partner violence:    Fear of current or ex partner: No    Emotionally abused: No    Physically abused: No    Forced sexual activity: No  Other Topics Concern  . Not on file  Social History Narrative  . Not on file    FAMILY HISTORY: Family History  Problem Relation Age of Onset  . Heart disease Mother   .  Brain cancer Mother   . Heart attack Father   . Liver disease Daughter   . Throat cancer Brother   . Cancer Maternal Grandfather        Unsure type    ALLERGIES:  has No Known Allergies.  MEDICATIONS:  Current Outpatient Medications  Medication Sig Dispense Refill  . acetaminophen (TYLENOL) 500 MG tablet Take 2 tablets  (1,000 mg total) by mouth 2 (two) times daily. 30 tablet   . amLODipine (NORVASC) 10 MG tablet Take 1 tablet (10 mg total) by mouth daily. 30 tablet 5  . aspirin 325 MG tablet Take 325 mg by mouth daily.    Marland Kitchen atorvastatin (LIPITOR) 40 MG tablet Take 1 tablet (40 mg total) by mouth daily. 90 tablet 3  . lisinopril-hydrochlorothiazide (PRINZIDE,ZESTORETIC) 20-12.5 MG tablet Take 2 tablets by mouth daily. 60 tablet 5  . Multiple Vitamins-Calcium (ONE-A-DAY WOMENS PO) Take 1 tablet by mouth daily.    . ranitidine (ZANTAC) 150 MG tablet Take 1 tablet (150 mg total) by mouth 2 (two) times daily as needed for heartburn. 60 tablet 5  . sertraline (ZOLOFT) 50 MG tablet Take 1 tablet (50 mg total) by mouth daily. 30 tablet 5   No current facility-administered medications for this visit.      PHYSICAL EXAMINATION: ECOG PERFORMANCE STATUS: 1 - Symptomatic but completely ambulatory Vitals:   10/06/17 1226  BP: 114/62  Pulse: 60  Resp: 18  Temp: (!) 96.7 F (35.9 C)   Filed Weights   10/06/17 1226  Weight: 101 lb 11.9 oz (46.2 kg)    Physical Exam  Constitutional: She is oriented to person, place, and time. She appears well-developed and well-nourished. No distress.  HENT:  Head: Normocephalic and atraumatic.  Eyes: Pupils are equal, round, and reactive to light. EOM are normal. No scleral icterus.  Neck: Normal range of motion. Neck supple.  Cardiovascular: Normal rate, regular rhythm and normal heart sounds.  Pulmonary/Chest: Effort normal and breath sounds normal. No respiratory distress. She has no wheezes. She has no rales. She exhibits no tenderness.  Abdominal: Soft. Bowel sounds are normal. She exhibits no distension and no mass. There is no tenderness.  Musculoskeletal: Normal range of motion. She exhibits no edema or deformity.  Lymphadenopathy:    She has no cervical adenopathy.  Neurological: She is alert and oriented to person, place, and time. No cranial nerve deficit.  Coordination normal.  Skin: Skin is warm and dry. No rash noted.  Psychiatric: She has a normal mood and affect.     LABORATORY DATA:  I have reviewed the data as listed Lab Results  Component Value Date   WBC 4.3 10/06/2017   HGB 10.9 (L) 10/06/2017   HCT 32.8 (L) 10/06/2017   MCV 93.9 10/06/2017   PLT 152 10/06/2017   Recent Labs    10/26/16 1148 06/10/17 1153 09/29/17 1122  NA 136 138 138  K 3.9 3.9 3.7  CL 93* 96 100*  CO2 '28 27 28  '$ GLUCOSE 93 96 96  BUN 16 18 29*  CREATININE 1.04* 1.12* 1.34*  CALCIUM 9.3 9.3 8.7*  GFRNONAA 51* 47* 36*  GFRAA 59* 54* 42*  PROT 6.6  --  7.3  ALBUMIN 4.2  --  4.1  AST 19  --  26  ALT 15  --  22  ALKPHOS 59  --  66  BILITOT 0.3  --  0.6       ASSESSMENT & PLAN:  1. Other pancytopenia ([place])  2. Thrombocytopenia ([place])   3. Anemia, unspecified type   4. Neutropenia, unspecified type ([place])    For the work up of patient's pancytopenia, I recommend checking CBC;CMP, LDH; pathology smear review, folate, Vitamin B12, hepatitis, HIV, ANA,  and monoclonal gammopathy workup. Will also check ultrasound of the abdomen.  Also, discussed with the patient that if no clear etiology found- bone marrow biopsy would be suggested. Currently await for the above workup.   # Patient follow-up with me in approximately 2 weeks to review the above results.  All questions were answered. The patient knows to call the clinic with any problems questions or concerns.  Return of visit: 2 weeks. Thank you for this kind referral and the opportunity to participate in the care of this patient. A copy of today's note is routed to referring provider  Total face to face encounter time for this patient visit was 68mn. >50% of the time was  spent in counseling and coordination of care.    ZEarlie Server MD, PhD Hematology Oncology [facility] Hospitalat AUpstate Gastroenterology LLCPager- [phone]

## 2017-10-07 LAB — HEPATITIS PANEL, ACUTE
HEP A IGM: NEGATIVE
HEP B C IGM: NEGATIVE
Hepatitis B Surface Ag: NEGATIVE

## 2017-10-07 LAB — HIV ANTIBODY (ROUTINE TESTING W REFLEX): HIV Screen 4th Generation wRfx: NONREACTIVE

## 2017-10-09 ENCOUNTER — Encounter: Payer: Self-pay | Admitting: Oncology

## 2017-10-09 LAB — PROTEIN ELECTROPHORESIS, SERUM
A/G Ratio: 1.2 (ref 0.7–1.7)
ALBUMIN ELP: 3.7 g/dL (ref 2.9–4.4)
ALPHA-1-GLOBULIN: 0.3 g/dL (ref 0.0–0.4)
Alpha-2-Globulin: 0.8 g/dL (ref 0.4–1.0)
BETA GLOBULIN: 0.9 g/dL (ref 0.7–1.3)
GAMMA GLOBULIN: 1 g/dL (ref 0.4–1.8)
Globulin, Total: 3 g/dL (ref 2.2–3.9)
Total Protein ELP: 6.7 g/dL (ref 6.0–8.5)

## 2017-10-10 ENCOUNTER — Ambulatory Visit: Payer: Medicare Other

## 2017-10-10 LAB — COMP PANEL: LEUKEMIA/LYMPHOMA

## 2017-10-11 ENCOUNTER — Ambulatory Visit
Admission: RE | Admit: 2017-10-11 | Discharge: 2017-10-11 | Disposition: A | Discharge status: Home / Self Care | Payer: Medicare Other | Source: Ambulatory Visit | Attending: Oncology | Admitting: Oncology

## 2017-10-11 DIAGNOSIS — D696 Thrombocytopenia, unspecified: Secondary | ICD-10-CM | POA: Insufficient documentation

## 2017-10-11 DIAGNOSIS — D709 Neutropenia, unspecified: Secondary | ICD-10-CM

## 2017-10-11 DIAGNOSIS — D61818 Other pancytopenia: Secondary | ICD-10-CM | POA: Diagnosis not present

## 2017-10-11 DIAGNOSIS — K8020 Calculus of gallbladder without cholecystitis without obstruction: Secondary | ICD-10-CM | POA: Insufficient documentation

## 2017-10-11 DIAGNOSIS — D649 Anemia, unspecified: Secondary | ICD-10-CM

## 2017-10-20 ENCOUNTER — Other Ambulatory Visit: Payer: Self-pay

## 2017-10-20 ENCOUNTER — Encounter: Payer: Self-pay | Admitting: Oncology

## 2017-10-20 ENCOUNTER — Telehealth: Payer: Self-pay

## 2017-10-20 ENCOUNTER — Inpatient Hospital Stay: Payer: Medicare Other | Attending: Oncology | Admitting: Oncology

## 2017-10-20 VITALS — BP 134/67 | HR 73 | Temp 96.2°F | Resp 18 | Wt 102.7 lb

## 2017-10-20 DIAGNOSIS — D649 Anemia, unspecified: Secondary | ICD-10-CM

## 2017-10-20 DIAGNOSIS — N189 Chronic kidney disease, unspecified: Secondary | ICD-10-CM | POA: Diagnosis not present

## 2017-10-20 DIAGNOSIS — K219 Gastro-esophageal reflux disease without esophagitis: Secondary | ICD-10-CM

## 2017-10-20 DIAGNOSIS — R634 Abnormal weight loss: Secondary | ICD-10-CM

## 2017-10-20 DIAGNOSIS — D709 Neutropenia, unspecified: Secondary | ICD-10-CM

## 2017-10-20 DIAGNOSIS — R300 Dysuria: Secondary | ICD-10-CM

## 2017-10-20 DIAGNOSIS — D696 Thrombocytopenia, unspecified: Secondary | ICD-10-CM | POA: Diagnosis not present

## 2017-10-20 DIAGNOSIS — D61818 Other pancytopenia: Secondary | ICD-10-CM

## 2017-10-20 DIAGNOSIS — M419 Scoliosis, unspecified: Secondary | ICD-10-CM

## 2017-10-20 DIAGNOSIS — R5382 Chronic fatigue, unspecified: Secondary | ICD-10-CM | POA: Diagnosis not present

## 2017-10-20 DIAGNOSIS — M810 Age-related osteoporosis without current pathological fracture: Secondary | ICD-10-CM

## 2017-10-20 MED ORDER — ENSURE PO LIQD: 237.0000 mL | Freq: Two times a day (BID) | ORAL | 12 refills | Status: AC

## 2017-10-20 MED ORDER — FERROUS SULFATE 325 (65 FE) MG PO TBEC: 325.0000 mg | DELAYED_RELEASE_TABLET | Freq: Two times a day (BID) | ORAL | 2 refills | Status: DC

## 2017-10-20 NOTE — Telephone Encounter (Signed)
G.Daughter called requesting we send RX for Ensure Suppl. Twice daily and Shower Chair to use daily to avoid falls in shower. I can order this in Epic if you approve. Also I gave samples of Ensure since they come to [place] later today. [facility] Fax [phone].

## 2017-10-20 NOTE — Progress Notes (Signed)
Patient here today for follow up. C/o pain to neck and right hip. Family concerned about decreased appetite.

## 2017-10-20 NOTE — Progress Notes (Signed)
Hematology/Oncology Follow up  note [facility] [phone] [phone]) [phone]   Patient Care Team: Glean Hess, MD as PCP - General (Internal Medicine)  REFERRING PROVIDER: Glean Hess, MD CHIEF COMPLAINTS/PURPOSE OF CONSULTATION:  Evaluation of pancytopenia  HISTORY OF PRESENTING ILLNESS:  Vanessa Farmer is a  82 y.o.  female with PMH listed below who was referred to me for evaluation of pancytopenia.  Patient recently had lab work done which revealed pancytopenia with platelet counts 146,000, Hemoglobin 10.9, WBC 2.3, ANC 1.1 Patient reports  fatigue, denies weight loss, easy bruising, hematochezia, hemoptysis Patient denies fatigue, weight loss, easy bruising, hematochezia, hemoptysis.  History of DVT: Denies  INTERVAL HISTORY Vanessa Farmer is a 82 y.o. female who has above history reviewed by me today presents for follow up visit for management of  Problems and complaints are listed below: Fatigue: reports worsening fatigue. Chronic onset, perisistent, no aggravating or improving factors, no associated symptoms.  Weight loss: Patient reports her pants as being loose, only weighs 102 pounds now.  Appetite is poor. Chronic acid reflux, worsened.  She feels that "bleching up" when she eats. #Pain while passing urine, reports urgency, frequency and a burning sensation.  Denies any fever or chills.  Duration for a week also.  Not associated with fever or chills, nausea vomiting.  Review of Systems  Constitutional: Positive for malaise/fatigue and weight loss. Negative for chills and fever.  HENT: Negative for congestion, ear discharge, ear pain, nosebleeds, sinus pain and sore throat.   Eyes: Negative for double vision, photophobia, pain, discharge and redness.  Respiratory: Negative for cough, hemoptysis, sputum production, shortness of breath and wheezing.   Cardiovascular: Negative for chest pain, palpitations, orthopnea, claudication  and leg swelling.  Gastrointestinal: Positive for heartburn. Negative for abdominal pain, blood in stool, constipation, diarrhea, melena, nausea and vomiting.  Genitourinary: Negative for dysuria, flank pain, frequency and hematuria.  Musculoskeletal: Negative for back pain, myalgias and neck pain.  Skin: Negative for itching and rash.  Neurological: Negative for dizziness, tingling, tremors, focal weakness, weakness and headaches.  Endo/Heme/Allergies: Negative for environmental allergies. Does not bruise/bleed easily.  Psychiatric/Behavioral: Negative for depression and hallucinations. The patient is not nervous/anxious.     MEDICAL HISTORY:  Past Medical History:  Diagnosis Date  . Cataract   . Fall   . Hypertension     SURGICAL HISTORY: Past Surgical History:  Procedure Laterality Date  . TONSILLECTOMY      SOCIAL HISTORY: Social History   Socioeconomic History  . Marital status: Widowed    Spouse name: Not on file  . Number of children: 4  . Years of education: Not on file  . Highest education level: 9th grade  Occupational History  . Occupation: Retired  Scientific laboratory technician  . Financial resource strain: Not hard at all  . Food insecurity:    Worry: Never true    Inability: Never true  . Transportation needs:    Medical: No    Non-medical: No  Tobacco Use  . Smoking status: Never Smoker  . Smokeless tobacco: Never Used  . Tobacco comment: smoking cessation materials not required  Substance and Sexual Activity  . Alcohol use: No  . Drug use: No  . Sexual activity: Not Currently  Lifestyle  . Physical activity:    Days per week: 0 days    Minutes per session: 0 min  . Stress: Not at all  Relationships  . Social connections:    Talks on phone: Patient  refused    Gets together: Patient refused    Attends religious service: Patient refused    Active member of club or organization: Patient refused    Attends meetings of clubs or organizations: Patient refused     Relationship status: Widowed  . Intimate partner violence:    Fear of current or ex partner: No    Emotionally abused: No    Physically abused: No    Forced sexual activity: No  Other Topics Concern  . Not on file  Social History Narrative  . Not on file    FAMILY HISTORY: Family History  Problem Relation Age of Onset  . Heart disease Mother   . Brain cancer Mother   . Heart attack Father   . Liver disease Daughter   . Throat cancer Brother   . Cancer Maternal Grandfather        Unsure type    ALLERGIES:  has No Known Allergies.  MEDICATIONS:  Current Outpatient Medications  Medication Sig Dispense Refill  . acetaminophen (TYLENOL) 500 MG tablet Take 2 tablets (1,000 mg total) by mouth 2 (two) times daily. 30 tablet   . amLODipine (NORVASC) 10 MG tablet Take 1 tablet (10 mg total) by mouth daily. 30 tablet 5  . aspirin 325 MG tablet Take 325 mg by mouth daily.    Marland Kitchen atorvastatin (LIPITOR) 40 MG tablet Take 1 tablet (40 mg total) by mouth daily. 90 tablet 3  . lisinopril-hydrochlorothiazide (PRINZIDE,ZESTORETIC) 20-12.5 MG tablet Take 2 tablets by mouth daily. 60 tablet 5  . Multiple Vitamins-Calcium (ONE-A-DAY WOMENS PO) Take 1 tablet by mouth daily.    . ranitidine (ZANTAC) 150 MG tablet Take 1 tablet (150 mg total) by mouth 2 (two) times daily as needed for heartburn. 60 tablet 5  . sertraline (ZOLOFT) 50 MG tablet Take 1 tablet (50 mg total) by mouth daily. 30 tablet 5   No current facility-administered medications for this visit.      PHYSICAL EXAMINATION: ECOG PERFORMANCE STATUS: 1 - Symptomatic but completely ambulatory Vitals:   10/20/17 1024  BP: 134/67  Pulse: 73  Resp: 18  Temp: (!) 96.2 F (35.7 C)   Filed Weights   10/20/17 1024  Weight: 102 lb 11.8 oz (46.6 kg)    Physical Exam  Constitutional: She is oriented to person, place, and time. She appears well-developed and well-nourished. No distress.  HENT:  Head: Normocephalic and atraumatic.    Right Ear: External ear normal.  Left Ear: External ear normal.  Mouth/Throat: Oropharynx is clear and moist.  Eyes: Pupils are equal, round, and reactive to light. Conjunctivae and EOM are normal. No scleral icterus.  Neck: Normal range of motion. Neck supple.  Cardiovascular: Normal rate, regular rhythm and normal heart sounds.  Pulmonary/Chest: Effort normal and breath sounds normal. No respiratory distress. She has no wheezes. She has no rales. She exhibits no tenderness.  Abdominal: Soft. Bowel sounds are normal. She exhibits no distension and no mass. There is no tenderness.  Musculoskeletal: Normal range of motion. She exhibits no edema or deformity.  Lymphadenopathy:    She has no cervical adenopathy.  Neurological: She is alert and oriented to person, place, and time. No cranial nerve deficit. Coordination normal.  Skin: Skin is warm and dry. No rash noted.  Psychiatric: She has a normal mood and affect. Her behavior is normal. Thought content normal.     LABORATORY DATA:  I have reviewed the data as listed Lab Results  Component Value Date  WBC 4.3 10/06/2017   HGB 10.9 (L) 10/06/2017   HCT 32.8 (L) 10/06/2017   MCV 93.9 10/06/2017   PLT 152 10/06/2017   Recent Labs    10/26/16 1148 06/10/17 1153 09/29/17 1122  NA 136 138 138  K 3.9 3.9 3.7  CL 93* 96 100*  CO2 28 27 28   GLUCOSE 93 96 96  BUN 16 18 29*  CREATININE 1.04* 1.12* 1.34*  CALCIUM 9.3 9.3 8.7*  GFRNONAA 51* 47* 36*  GFRAA 59* 54* 42*  PROT 6.6  --  7.3  ALBUMIN 4.2  --  4.1  AST 19  --  26  ALT 15  --  22  ALKPHOS 59  --  66  BILITOT 0.3  --  0.6       ASSESSMENT & PLAN:  1. Anemia, unspecified type   2. Other pancytopenia ([place])   3. Thrombocytopenia ([place])   4. Neutropenia, unspecified type ([place])   5. Dysuria    #Repeat CBC showed normal platelet counts, stable hemoglobin at 10.9, her neutropenia also resolved. Normal vitamin B12 and folate level.  Negative flow cytometry. Discussed  with patient that she has chronic anemia which most likely secondary to chronic kidney disease.  Her iron panel was reviewed with patient.  Iron stores is normal, recommend she start taking ferrous sulfate 325 mg twice daily to further improve iron store in the context of her chronic kidney disease.  #Dysuria: Likely urinary tract infection.  I offered to check a urine sample.  Patient says that she is just passed urine and cannot give one. Advised patient to call primary care physician and have urine checked and may be she needs a course of antibiotics.  She voices understanding.  #Abnormal weight loss, worsening of chronic acid reflux. I will refer patient to gastroenterology Dr. Allen Norris dysuria for further evaluation.    Recommend patient to follow-up with me in 3 months with repeat CBC CMP iron panel.   All questions were answered. The patient knows to call the clinic with any problems questions or concerns. Orders Placed This Encounter  Procedures  . CBC with Differential/Platelet    Standing Status:   Future    Standing Expiration Date:   10/21/2018  . Ferritin    Standing Status:   Future    Standing Expiration Date:   10/21/2018  . Iron and TIBC    Standing Status:   Future    Standing Expiration Date:   10/21/2018  . Ambulatory referral to Gastroenterology    Referral Priority:   Routine    Referral Type:   Consultation    Referral Reason:   Specialty Services Required    Referred to Provider:   Lucilla Lame, MD    Number of Visits Requested:   1   Return of visit: 3 months Thank you for this kind referral and the opportunity to participate in the care of this patient. A copy of today's note is routed to referring provider    Earlie Server, MD, PhD Hematology Oncology [facility] at [facility] Pager- [phone]

## 2017-10-20 NOTE — Telephone Encounter (Signed)
Okay to do in the system.

## 2017-10-24 ENCOUNTER — Encounter: Payer: Self-pay | Admitting: Internal Medicine

## 2017-10-24 ENCOUNTER — Ambulatory Visit (INDEPENDENT_AMBULATORY_CARE_PROVIDER_SITE_OTHER): Payer: Medicare Other | Admitting: Internal Medicine

## 2017-10-24 VITALS — BP 120/80 | HR 86 | Temp 97.3°F | Resp 16 | Ht 60.0 in | Wt 102.0 lb

## 2017-10-24 DIAGNOSIS — I2510 Atherosclerotic heart disease of native coronary artery without angina pectoris: Secondary | ICD-10-CM

## 2017-10-24 DIAGNOSIS — N3001 Acute cystitis with hematuria: Secondary | ICD-10-CM

## 2017-10-24 LAB — POC URINALYSIS WITH MICROSCOPIC (NON AUTO)MANUAL RESULT
Bilirubin, UA: NEGATIVE
CRYSTALS: 0
GLUCOSE UA: NEGATIVE
Ketones, UA: NEGATIVE
MUCUS UA: 0
Nitrite, UA: POSITIVE
PH UA: 6 (ref 5.0–8.0)
Protein, UA: NEGATIVE
RBC: 0 M/uL — AB (ref 4.04–5.48)
Urobilinogen, UA: 0.2 E.U./dL

## 2017-10-24 MED ORDER — CEPHALEXIN 500 MG PO CAPS: 500.0000 mg | ORAL_CAPSULE | Freq: Four times a day (QID) | ORAL | 0 refills | Status: AC

## 2017-10-24 NOTE — Progress Notes (Signed)
Date:  10/24/2017   Name:  Vanessa Farmer   DOB:  17-Sep-1935   MRN:  [id number]   Chief Complaint: Urinary Tract Infection (back pain, pressure ) Urinary Tract Infection   This is a new problem. The current episode started in the past 7 days. The problem occurs every urination. The quality of the pain is described as aching. The patient is experiencing no pain. There has been no fever. Associated symptoms include frequency and hesitancy. Pertinent negatives include no chills or vomiting. She has tried increased fluids for the symptoms.      Review of Systems  Constitutional: Negative for chills.  Gastrointestinal: Negative for vomiting.  Genitourinary: Positive for frequency and hesitancy.    Patient Active Problem List   Diagnosis Date Noted  . Pancytopenia ([place]) 09/30/2017  . GERD (gastroesophageal reflux disease) 06/10/2017  . CKD (chronic kidney disease) stage 3, GFR 30-59 ml/min ([facility]) 06/08/2016  . Hyperlipidemia 06/08/2016  . Scoliosis 06/07/2016  . Osteoporosis 06/07/2016  . Hypertension 06/07/2016  . CAD (coronary artery disease) 06/07/2016  . Depression, major, single episode, moderate ([place]) 06/07/2016    Prior to Admission medications   Medication Sig Start Date End Date Taking? Authorizing Provider  acetaminophen (TYLENOL) 500 MG tablet Take 2 tablets (1,000 mg total) by mouth 2 (two) times daily. 06/14/16  Yes Plonk, Gwyndolyn Saxon, MD  amLODipine (NORVASC) 10 MG tablet Take 1 tablet (10 mg total) by mouth daily. 09/29/17  Yes Glean Hess, MD  aspirin 325 MG tablet Take 325 mg by mouth daily.   Yes [provider]  atorvastatin (LIPITOR) 40 MG tablet Take 1 tablet (40 mg total) by mouth daily. 06/10/17  Yes Plonk, Gwyndolyn Saxon, MD  ENSURE (ENSURE) Take 237 mLs by mouth 2 (two) times daily between meals. 10/20/17 10/21/18 Yes Glean Hess, MD  ferrous sulfate 325 (65 FE) MG EC tablet Take 1 tablet (325 mg total) by mouth 2 (two) times daily with a meal. 11/19/17  Yes  Earlie Server, MD  lisinopril-hydrochlorothiazide (PRINZIDE,ZESTORETIC) 20-12.5 MG tablet Take 2 tablets by mouth daily. 09/29/17  Yes Glean Hess, MD  Multiple Vitamins-Calcium (ONE-A-DAY WOMENS PO) Take 1 tablet by mouth daily.   Yes [provider]  ranitidine (ZANTAC) 150 MG tablet Take 1 tablet (150 mg total) by mouth 2 (two) times daily as needed for heartburn. 09/29/17  Yes Glean Hess, MD  sertraline (ZOLOFT) 50 MG tablet Take 1 tablet (50 mg total) by mouth daily. 09/29/17  Yes Glean Hess, MD    No Known Allergies  Past Surgical History:  Procedure Laterality Date  . TONSILLECTOMY      Social History   Tobacco Use  . Smoking status: Never Smoker  . Smokeless tobacco: Never Used  . Tobacco comment: smoking cessation materials not required  Substance Use Topics  . Alcohol use: No  . Drug use: No     Medication list has been reviewed and updated.  Current Meds  Medication Sig  . acetaminophen (TYLENOL) 500 MG tablet Take 2 tablets (1,000 mg total) by mouth 2 (two) times daily.  Marland Kitchen amLODipine (NORVASC) 10 MG tablet Take 1 tablet (10 mg total) by mouth daily.  Marland Kitchen aspirin 325 MG tablet Take 325 mg by mouth daily.  Marland Kitchen atorvastatin (LIPITOR) 40 MG tablet Take 1 tablet (40 mg total) by mouth daily.  Marland Kitchen ENSURE (ENSURE) Take 237 mLs by mouth 2 (two) times daily between meals.  Derrill Memo ON 11/19/2017] ferrous sulfate 325 (65  FE) MG EC tablet Take 1 tablet (325 mg total) by mouth 2 (two) times daily with a meal.  . lisinopril-hydrochlorothiazide (PRINZIDE,ZESTORETIC) 20-12.5 MG tablet Take 2 tablets by mouth daily.  . Multiple Vitamins-Calcium (ONE-A-DAY WOMENS PO) Take 1 tablet by mouth daily.  . ranitidine (ZANTAC) 150 MG tablet Take 1 tablet (150 mg total) by mouth 2 (two) times daily as needed for heartburn.  . sertraline (ZOLOFT) 50 MG tablet Take 1 tablet (50 mg total) by mouth daily.    PHQ 2/9 Scores 10/24/2017 09/29/2017 06/01/2017 01/31/2017  PHQ - 2 Score  0 5 0 0  PHQ- 9 Score 2 9 - 1    Physical Exam  Constitutional: She appears well-developed and well-nourished.  Cardiovascular: Normal rate, regular rhythm and normal heart sounds.  Pulmonary/Chest: Effort normal and breath sounds normal. No respiratory distress.  Abdominal: Soft. Bowel sounds are normal. There is tenderness in the suprapubic area. There is no rebound, no guarding and no CVA tenderness.  Psychiatric: She has a normal mood and affect.  Nursing note and vitals reviewed.   BP 120/80   Pulse 86   Temp (!) 97.3 F (36.3 C) (Oral)   Resp 16   Ht 5' (1.524 m)   Wt 102 lb (46.3 kg)   SpO2 96%   BMI 19.92 kg/m   Assessment and Plan: 1. Acute cystitis with hematuria Continue to push fluids - POC urinalysis w microscopic (non auto) - cephALEXin (KEFLEX) 500 MG capsule; Take 1 capsule (500 mg total) by mouth 4 (four) times daily for 7 days.  Dispense: 28 capsule; Refill: 0   Meds ordered this encounter  Medications  . cephALEXin (KEFLEX) 500 MG capsule    Sig: Take 1 capsule (500 mg total) by mouth 4 (four) times daily for 7 days.    Dispense:  28 capsule    Refill:  0    Partially dictated using Editor, commissioning. Any errors are unintentional.  Halina Maidens, MD [facility]  10/24/2017   There are no diagnoses linked to this encounter.

## 2017-11-23 ENCOUNTER — Other Ambulatory Visit: Payer: Self-pay

## 2017-11-23 ENCOUNTER — Ambulatory Visit (INDEPENDENT_AMBULATORY_CARE_PROVIDER_SITE_OTHER): Payer: Medicare Other | Admitting: Gastroenterology

## 2017-11-23 ENCOUNTER — Encounter: Payer: Self-pay | Admitting: Gastroenterology

## 2017-11-23 VITALS — BP 127/58 | HR 70 | Ht 60.0 in | Wt 107.0 lb

## 2017-11-23 DIAGNOSIS — D500 Iron deficiency anemia secondary to blood loss (chronic): Secondary | ICD-10-CM

## 2017-11-23 DIAGNOSIS — I2510 Atherosclerotic heart disease of native coronary artery without angina pectoris: Secondary | ICD-10-CM | POA: Diagnosis not present

## 2017-11-23 DIAGNOSIS — R634 Abnormal weight loss: Secondary | ICD-10-CM | POA: Diagnosis not present

## 2017-11-23 NOTE — Progress Notes (Signed)
Gastroenterology Consultation  Referring Provider:     Earlie Server, MD Primary Care Physician:  Glean Hess, MD Primary Gastroenterologist:  Dr. Allen Norris     Reason for Consultation:     Iron deficiency anemia        HPI:   Vanessa Farmer is a 82 y.o. y/o female referred for consultation & management of Iron deficiency anemia by Dr. Army Melia, Jesse Sans, MD.  This patient was sent to me for evaluation of iron deficiency anemia.  The patient has a saturation which is low at 10% and has had abnormal hemoglobin and hematocrit. The patient's hemoglobin has been around 10.8.  The patient does have a history of hematuria. Approximately 1 month ago the patient had chemistries that showed her BUN to be increased out of proportion to her creatinine. The patient also had an ultrasound due to neutropenia and thrombocytopenia by hematology.  At that time the patient was found to have a gallstone in the gallbladder. There is also mild sludge in the gallbladder.  The patient's liver enzymes have not been elevated recently. The patient has also been noted to have improvement when starting iron.  The patient was also reporting unexplained weight loss and chronic heartburn and was referred to me for evaluation.  The patient denies ever having a colonoscopy in the past.  She also has been losing weight and had gone down to 102 pounds.  The patient is now back up to 107 pounds she reports that she is always been skinny but not this kidney.  There is no report of any black stools or bloody stools.  She also denies any unexplained weight loss.  Past Medical History:  Diagnosis Date  . Cataract   . Fall   . Hypertension     Past Surgical History:  Procedure Laterality Date  . TONSILLECTOMY      Prior to Admission medications   Medication Sig Start Date End Date Taking? Authorizing Provider  acetaminophen (TYLENOL) 500 MG tablet Take 2 tablets (1,000 mg total) by mouth 2 (two) times daily. 06/14/16   Plonk,  Gwyndolyn Saxon, MD  amLODipine (NORVASC) 10 MG tablet Take 1 tablet (10 mg total) by mouth daily. 09/29/17   Glean Hess, MD  aspirin 325 MG tablet Take 325 mg by mouth daily.    [provider]  atorvastatin (LIPITOR) 40 MG tablet Take 1 tablet (40 mg total) by mouth daily. 06/10/17   Plonk, Gwyndolyn Saxon, MD  ENSURE (ENSURE) Take 237 mLs by mouth 2 (two) times daily between meals. 10/20/17 10/21/18  Glean Hess, MD  ferrous sulfate 325 (65 FE) MG EC tablet Take 1 tablet (325 mg total) by mouth 2 (two) times daily with a meal. 11/19/17   Earlie Server, MD  lisinopril-hydrochlorothiazide (PRINZIDE,ZESTORETIC) 20-12.5 MG tablet Take 2 tablets by mouth daily. 09/29/17   Glean Hess, MD  Multiple Vitamins-Calcium (ONE-A-DAY WOMENS PO) Take 1 tablet by mouth daily.    [provider]  ranitidine (ZANTAC) 150 MG tablet Take 1 tablet (150 mg total) by mouth 2 (two) times daily as needed for heartburn. 09/29/17   Glean Hess, MD  sertraline (ZOLOFT) 50 MG tablet Take 1 tablet (50 mg total) by mouth daily. 09/29/17   Glean Hess, MD    Family History  Problem Relation Age of Onset  . Heart disease Mother   . Brain cancer Mother   . Heart attack Father   . Liver disease Daughter   . Throat  cancer Brother   . Cancer Maternal Grandfather        Unsure type     Social History   Tobacco Use  . Smoking status: Never Smoker  . Smokeless tobacco: Never Used  . Tobacco comment: smoking cessation materials not required  Substance Use Topics  . Alcohol use: No  . Drug use: No    Allergies as of 11/23/2017  . (No Known Allergies)    Review of Systems:    All systems reviewed and negative except where noted in HPI.   Physical Exam:  BP (!) 127/58   Pulse 70   Ht 5' (1.524 m)   Wt 107 lb (48.5 kg)   BMI 20.90 kg/m  No LMP recorded. Patient is postmenopausal. General:   Alert,  Well-developed, well-nourished, pleasant and cooperative in NAD Head:  Normocephalic and  atraumatic. Eyes:  Sclera clear, no icterus.   Conjunctiva pink. Ears:  Normal auditory acuity. Nose:  No deformity, discharge, or lesions. Mouth:  No deformity or lesions,oropharynx pink & moist. Neck:  Supple; no masses or thyromegaly. Lungs:  Respirations even and unlabored.  Clear throughout to auscultation.   No wheezes, crackles, or rhonchi. No acute distress. Heart:  Regular rate and rhythm; no murmurs, clicks, rubs, or gallops. Abdomen:  Normal bowel sounds.  No bruits.  Soft, non-tender and non-distended without masses, hepatosplenomegaly or hernias noted.  No guarding or rebound tenderness.  Negative Carnett sign.   Rectal:  Deferred.  Msk:  Symmetrical without gross deformities.  Good, equal movement & strength bilaterally. Pulses:  Normal pulses noted. Extremities:  No clubbing or edema.  No cyanosis. Neurologic:  Alert and oriented x3;  grossly normal neurologically. Skin:  Intact without significant lesions or rashes.  No jaundice. Lymph Nodes:  No significant cervical adenopathy. Psych:  Alert and cooperative. Normal mood and affect.  Imaging Studies: No results found.  Assessment and Plan:   [name] is a 82 y.o. y/o female who comes in today with a history of low iron saturation with anemia and never having an EGD or colonoscopy.  The patient has also had recent weight loss although she has gained some of it back.  The patient will be set up for an EGD and colonoscopy to look for the source of her anemia and weight loss. I have discussed risks & benefits which include, but are not limited to, bleeding, infection, perforation & drug reaction.  The patient agrees with this plan & written consent will be obtained.     Lucilla Lame, MD. Marval Regal    Note: This dictation was prepared with Dragon dictation along with smaller phrase technology. Any transcriptional errors that result from this process are unintentional.

## 2017-11-25 NOTE — Discharge Instructions (Signed)
General Anesthesia, Adult, Care After °These instructions provide you with information about caring for yourself after your procedure. Your health care provider may also give you more specific instructions. Your treatment has been planned according to current medical practices, but problems sometimes occur. Call your health care provider if you have any problems or questions after your procedure. °What can I expect after the procedure? °After the procedure, it is common to have: °· Vomiting. °· A sore throat. °· Mental slowness. ° °It is common to feel: °· Nauseous. °· Cold or shivery. °· Sleepy. °· Tired. °· Sore or achy, even in parts of your body where you did not have surgery. ° °Follow these instructions at home: °For at least 24 hours after the procedure: °· Do not: °? Participate in activities where you could fall or become injured. °? Drive. °? Use heavy machinery. °? Drink alcohol. °? Take sleeping pills or medicines that cause drowsiness. °? Make important decisions or sign legal documents. °? Take care of children on your own. °· Rest. °Eating and drinking °· If you vomit, drink water, juice, or soup when you can drink without vomiting. °· Drink enough fluid to keep your urine clear or pale yellow. °· Make sure you have little or no nausea before eating solid foods. °· Follow the diet recommended by your health care provider. °General instructions °· Have a responsible adult stay with you until you are awake and alert. °· Return to your normal activities as told by your health care provider. Ask your health care provider what activities are safe for you. °· Take over-the-counter and prescription medicines only as told by your health care provider. °· If you smoke, do not smoke without supervision. °· Keep all follow-up visits as told by your health care provider. This is important. °Contact a health care provider if: °· You continue to have nausea or vomiting at home, and medicines are not helpful. °· You  cannot drink fluids or start eating again. °· You cannot urinate after 8-12 hours. °· You develop a skin rash. °· You have fever. °· You have increasing redness at the site of your procedure. °Get help right away if: °· You have difficulty breathing. °· You have chest pain. °· You have unexpected bleeding. °· You feel that you are having a life-threatening or urgent problem. °This information is not intended to replace advice given to you by your health care provider. Make sure you discuss any questions you have with your health care provider. °Document Released: 08/02/2000 Document Revised: 09/29/2015 Document Reviewed: 04/10/2015 °Elsevier Interactive Patient Education © 2018 [employer] Inc. ° °

## 2017-11-28 ENCOUNTER — Encounter: Admission: RE | Payer: Self-pay | Source: Ambulatory Visit

## 2017-11-28 ENCOUNTER — Other Ambulatory Visit: Payer: Self-pay

## 2017-11-28 ENCOUNTER — Ambulatory Visit: Admission: RE | Admit: 2017-11-28 | Payer: Medicare Other | Source: Ambulatory Visit | Admitting: Gastroenterology

## 2017-11-28 DIAGNOSIS — D500 Iron deficiency anemia secondary to blood loss (chronic): Secondary | ICD-10-CM

## 2017-11-28 DIAGNOSIS — R634 Abnormal weight loss: Secondary | ICD-10-CM

## 2017-11-28 SURGERY — COLONOSCOPY WITH PROPOFOL
Anesthesia: Choice

## 2017-11-29 ENCOUNTER — Other Ambulatory Visit: Payer: Self-pay

## 2017-11-29 ENCOUNTER — Encounter: Payer: Self-pay | Admitting: *Deleted

## 2017-12-02 ENCOUNTER — Other Ambulatory Visit: Payer: Self-pay

## 2017-12-02 MED ORDER — PEG 3350-KCL-NABCB-NACL-NASULF 236 G PO SOLR: ORAL | 0 refills | Status: DC

## 2017-12-02 NOTE — Discharge Instructions (Signed)
General Anesthesia, Adult, Care After °These instructions provide you with information about caring for yourself after your procedure. Your health care provider may also give you more specific instructions. Your treatment has been planned according to current medical practices, but problems sometimes occur. Call your health care provider if you have any problems or questions after your procedure. °What can I expect after the procedure? °After the procedure, it is common to have: °· Vomiting. °· A sore throat. °· Mental slowness. ° °It is common to feel: °· Nauseous. °· Cold or shivery. °· Sleepy. °· Tired. °· Sore or achy, even in parts of your body where you did not have surgery. ° °Follow these instructions at home: °For at least 24 hours after the procedure: °· Do not: °? Participate in activities where you could fall or become injured. °? Drive. °? Use heavy machinery. °? Drink alcohol. °? Take sleeping pills or medicines that cause drowsiness. °? Make important decisions or sign legal documents. °? Take care of children on your own. °· Rest. °Eating and drinking °· If you vomit, drink water, juice, or soup when you can drink without vomiting. °· Drink enough fluid to keep your urine clear or pale yellow. °· Make sure you have little or no nausea before eating solid foods. °· Follow the diet recommended by your health care provider. °General instructions °· Have a responsible adult stay with you until you are awake and alert. °· Return to your normal activities as told by your health care provider. Ask your health care provider what activities are safe for you. °· Take over-the-counter and prescription medicines only as told by your health care provider. °· If you smoke, do not smoke without supervision. °· Keep all follow-up visits as told by your health care provider. This is important. °Contact a health care provider if: °· You continue to have nausea or vomiting at home, and medicines are not helpful. °· You  cannot drink fluids or start eating again. °· You cannot urinate after 8-12 hours. °· You develop a skin rash. °· You have fever. °· You have increasing redness at the site of your procedure. °Get help right away if: °· You have difficulty breathing. °· You have chest pain. °· You have unexpected bleeding. °· You feel that you are having a life-threatening or urgent problem. °This information is not intended to replace advice given to you by your health care provider. Make sure you discuss any questions you have with your health care provider. °Document Released: 08/02/2000 Document Revised: 09/29/2015 Document Reviewed: 04/10/2015 °Elsevier Interactive Patient Education © 2018 [employer] Inc. ° °

## 2017-12-05 ENCOUNTER — Ambulatory Visit: Payer: Medicare Other | Admitting: Anesthesiology

## 2017-12-05 ENCOUNTER — Ambulatory Visit
Admission: RE | Admit: 2017-12-05 | Discharge: 2017-12-05 | Disposition: A | Discharge status: Home / Self Care | Payer: Medicare Other | Source: Ambulatory Visit | Attending: Gastroenterology | Admitting: Gastroenterology

## 2017-12-05 ENCOUNTER — Encounter
Admission: RE | Disposition: A | Discharge status: Home / Self Care | Payer: Self-pay | Source: Ambulatory Visit | Attending: Gastroenterology

## 2017-12-05 ENCOUNTER — Ambulatory Visit: Payer: Medicare Other | Admitting: Internal Medicine

## 2017-12-05 ENCOUNTER — Other Ambulatory Visit: Payer: Self-pay | Admitting: Gastroenterology

## 2017-12-05 DIAGNOSIS — Z7982 Long term (current) use of aspirin: Secondary | ICD-10-CM | POA: Insufficient documentation

## 2017-12-05 DIAGNOSIS — I252 Old myocardial infarction: Secondary | ICD-10-CM | POA: Insufficient documentation

## 2017-12-05 DIAGNOSIS — K635 Polyp of colon: Secondary | ICD-10-CM | POA: Diagnosis not present

## 2017-12-05 DIAGNOSIS — Z79899 Other long term (current) drug therapy: Secondary | ICD-10-CM | POA: Diagnosis not present

## 2017-12-05 DIAGNOSIS — D125 Benign neoplasm of sigmoid colon: Secondary | ICD-10-CM | POA: Diagnosis not present

## 2017-12-05 DIAGNOSIS — K210 Gastro-esophageal reflux disease with esophagitis: Secondary | ICD-10-CM | POA: Diagnosis not present

## 2017-12-05 DIAGNOSIS — K640 First degree hemorrhoids: Secondary | ICD-10-CM | POA: Diagnosis not present

## 2017-12-05 DIAGNOSIS — K449 Diaphragmatic hernia without obstruction or gangrene: Secondary | ICD-10-CM | POA: Insufficient documentation

## 2017-12-05 DIAGNOSIS — I10 Essential (primary) hypertension: Secondary | ICD-10-CM | POA: Insufficient documentation

## 2017-12-05 DIAGNOSIS — Z808 Family history of malignant neoplasm of other organs or systems: Secondary | ICD-10-CM | POA: Insufficient documentation

## 2017-12-05 DIAGNOSIS — I2510 Atherosclerotic heart disease of native coronary artery without angina pectoris: Secondary | ICD-10-CM | POA: Insufficient documentation

## 2017-12-05 DIAGNOSIS — D508 Other iron deficiency anemias: Secondary | ICD-10-CM | POA: Diagnosis not present

## 2017-12-05 DIAGNOSIS — D509 Iron deficiency anemia, unspecified: Secondary | ICD-10-CM | POA: Diagnosis not present

## 2017-12-05 DIAGNOSIS — Z8249 Family history of ischemic heart disease and other diseases of the circulatory system: Secondary | ICD-10-CM | POA: Diagnosis not present

## 2017-12-05 HISTORY — DX: Anemia, unspecified: D64.9

## 2017-12-05 HISTORY — DX: Acute myocardial infarction, unspecified: I21.9

## 2017-12-05 HISTORY — PX: ESOPHAGOGASTRODUODENOSCOPY (EGD) WITH PROPOFOL: SHX5813

## 2017-12-05 HISTORY — PX: POLYPECTOMY: SHX5525

## 2017-12-05 HISTORY — PX: COLONOSCOPY WITH PROPOFOL: SHX5780

## 2017-12-05 SURGERY — COLONOSCOPY WITH PROPOFOL
Anesthesia: General | Site: Rectum | Wound class: Contaminated

## 2017-12-05 MED ORDER — SIMETHICONE 40 MG/0.6ML PO SUSP
ORAL | Status: DC | PRN
  Administered 2017-12-05: .5 mL

## 2017-12-05 MED ORDER — OXYCODONE HCL 5 MG PO TABS: 5.0000 mg | ORAL_TABLET | Freq: Once | ORAL | Status: DC | PRN

## 2017-12-05 MED ORDER — DEXMEDETOMIDINE HCL 200 MCG/2ML IV SOLN
INTRAVENOUS | Status: DC | PRN
  Administered 2017-12-05: 4 ug via INTRAVENOUS

## 2017-12-05 MED ORDER — PROPOFOL 10 MG/ML IV BOLUS
INTRAVENOUS | Status: DC | PRN
  Administered 2017-12-05 (×2): 20 mg via INTRAVENOUS
  Administered 2017-12-05: 50 mg via INTRAVENOUS
  Administered 2017-12-05 (×4): 20 mg via INTRAVENOUS
  Administered 2017-12-05: 50 mg via INTRAVENOUS
  Administered 2017-12-05: 20 mg via INTRAVENOUS
  Administered 2017-12-05: 10 mg via INTRAVENOUS

## 2017-12-05 MED ORDER — LACTATED RINGERS IV SOLN
INTRAVENOUS | Status: DC
  Administered 2017-12-05: 08:00:00 via INTRAVENOUS

## 2017-12-05 MED ORDER — OXYCODONE HCL 5 MG/5ML PO SOLN: 5.0000 mg | Freq: Once | ORAL | Status: DC | PRN

## 2017-12-05 MED ORDER — PANTOPRAZOLE SODIUM 40 MG PO TBEC: 40.0000 mg | DELAYED_RELEASE_TABLET | Freq: Every day | ORAL | 1 refills | Status: DC

## 2017-12-05 MED ORDER — GLYCOPYRROLATE 0.2 MG/ML IJ SOLN
INTRAMUSCULAR | Status: DC | PRN
  Administered 2017-12-05: 0.1 mg via INTRAVENOUS

## 2017-12-05 MED ORDER — LIDOCAINE HCL (CARDIAC) PF 100 MG/5ML IV SOSY
PREFILLED_SYRINGE | INTRAVENOUS | Status: DC | PRN
  Administered 2017-12-05: 40 mg via INTRAVENOUS

## 2017-12-05 SURGICAL SUPPLY — 36 items
BALLN DILATOR 10-12 8 (BALLOONS)
BALLN DILATOR 12-15 8 (BALLOONS)
BALLN DILATOR 15-18 8 (BALLOONS)
BALLN DILATOR CRE 0-12 8 (BALLOONS)
BALLN DILATOR ESOPH 8 10 CRE (MISCELLANEOUS) IMPLANT
BALLOON DILATOR 12-15 8 (BALLOONS) IMPLANT
BALLOON DILATOR 15-18 8 (BALLOONS) IMPLANT
BALLOON DILATOR CRE 0-12 8 (BALLOONS) IMPLANT
BLOCK BITE 60FR ADLT L/F GRN (MISCELLANEOUS) ×5 IMPLANT
CANISTER SUCT 1200ML W/VALVE (MISCELLANEOUS) ×5 IMPLANT
CLIP HMST 235XBRD CATH ROT (MISCELLANEOUS) IMPLANT
CLIP RESOLUTION 360 11X235 (MISCELLANEOUS)
ELECT REM PT RETURN 9FT ADLT (ELECTROSURGICAL)
ELECTRODE REM PT RTRN 9FT ADLT (ELECTROSURGICAL) IMPLANT
FCP ESCP3.2XJMB 240X2.8X (MISCELLANEOUS)
FORCEPS BIOP RAD 4 LRG CAP 4 (CUTTING FORCEPS) IMPLANT
FORCEPS BIOP RJ4 240 W/NDL (MISCELLANEOUS)
FORCEPS ESCP3.2XJMB 240X2.8X (MISCELLANEOUS) IMPLANT
GOWN CVR UNV OPN BCK APRN NK (MISCELLANEOUS) ×6 IMPLANT
GOWN ISOL THUMB LOOP REG UNIV (MISCELLANEOUS) ×4
INJECTOR VARIJECT VIN23 (MISCELLANEOUS) IMPLANT
KIT DEFENDO VALVE AND CONN (KITS) IMPLANT
KIT ENDO PROCEDURE OLY (KITS) ×5 IMPLANT
MARKER SPOT ENDO TATTOO 5ML (MISCELLANEOUS) IMPLANT
PROBE APC STR FIRE (PROBE) IMPLANT
RETRIEVER NET PLAT FOOD (MISCELLANEOUS) IMPLANT
RETRIEVER NET ROTH 2.5X230 LF (MISCELLANEOUS) IMPLANT
SNARE SHORT THROW 13M SML OVAL (MISCELLANEOUS) IMPLANT
SNARE SHORT THROW 30M LRG OVAL (MISCELLANEOUS) IMPLANT
SNARE SNG USE RND 15MM (INSTRUMENTS) IMPLANT
SPOT EX ENDOSCOPIC TATTOO (MISCELLANEOUS)
SYR INFLATION 60ML (SYRINGE) IMPLANT
TRAP ETRAP POLY (MISCELLANEOUS) IMPLANT
VARIJECT INJECTOR VIN23 (MISCELLANEOUS)
WATER STERILE IRR 250ML POUR (IV SOLUTION) ×5 IMPLANT
WIRE CRE 18-20MM 8CM F G (MISCELLANEOUS) IMPLANT

## 2017-12-05 NOTE — Op Note (Signed)
[facility] Gastroenterology Patient Name: Vanessa Farmer Procedure Date: 12/05/2017 8:16 AM MRN: [id number] Account #: [id number] Date of Birth: 18-May-1935 Admit Type: Outpatient Age: 82 Room: [facility] OR ROOM 01 Gender: Female Note Status: Finalized Procedure:            Upper GI endoscopy Indications:          Iron deficiency anemia Providers:            Lucilla Lame MD, MD Referring MD:         Halina Maidens, MD (Referring MD) Medicines:            Propofol per Anesthesia Complications:        No immediate complications. Procedure:            Pre-Anesthesia Assessment:                       - Prior to the procedure, a History and Physical was                        performed, and patient medications and allergies were                        reviewed. The patient's tolerance of previous                        anesthesia was also reviewed. The risks and benefits of                        the procedure and the sedation options and risks were                        discussed with the patient. All questions were                        answered, and informed consent was obtained. Prior                        Anticoagulants: The patient has taken no previous                        anticoagulant or antiplatelet agents. ASA Grade                        Assessment: II - A patient with mild systemic disease.                        After reviewing the risks and benefits, the patient was                        deemed in satisfactory condition to undergo the                        procedure.                       After obtaining informed consent, the endoscope was                        passed under direct vision. Throughout the procedure,  the patient's blood pressure, pulse, and oxygen                        saturations were monitored continuously. The was                        introduced through the mouth, and advanced to the   second part of duodenum. The upper GI endoscopy was                        accomplished without difficulty. The patient tolerated                        the procedure well. Findings:      A 10 cm hiatal hernia was present.      LA Grade C (one or more mucosal breaks continuous between tops of 2 or       more mucosal folds, less than 75% circumference) esophagitis with no       bleeding was found in the lower third of the esophagus.      The examined duodenum was normal. Biopsies were taken with a cold       forceps for histology. Impression:           - 10 cm hiatal hernia.                       - LA Grade C reflux esophagitis.                       - Normal examined duodenum. Biopsied. Recommendation:       - Discharge patient to home.                       - Resume previous diet.                       - Continue present medications.                       - Await pathology results.                       - Perform a colonoscopy today. Procedure Code(s):    --- Professional ---                       [phone], Esophagogastroduodenoscopy, flexible, transoral;                        with biopsy, single or multiple Diagnosis Code(s):    --- Professional ---                       D50.9, Iron deficiency anemia, unspecified                       K21.0, Gastro-esophageal reflux disease with esophagitis CPT copyright 2017 American Medical Association. All rights reserved. The codes documented in this report are preliminary and upon coder review may  be revised to meet current compliance requirements. Lucilla Lame MD, MD 12/05/2017 8:32:17 AM This report has been signed electronically. Number of Addenda: 0 Note Initiated On: 12/05/2017 8:16 AM      [facility]

## 2017-12-05 NOTE — Anesthesia Procedure Notes (Signed)
Procedure Name: MAC Date/Time: 12/05/2017 8:23 AM Performed by: Janna Arch, CRNA Pre-anesthesia Checklist: Patient identified, Emergency Drugs available, Suction available and Patient being monitored Patient Re-evaluated:Patient Re-evaluated prior to induction Oxygen Delivery Method: Nasal cannula

## 2017-12-05 NOTE — Op Note (Signed)
[facility] Gastroenterology Patient Name: Vanessa Farmer Procedure Date: 12/05/2017 8:32 AM MRN: [id number] Account #: [id number] Date of Birth: 1936-04-13 Admit Type: Outpatient Age: 82 Room: [facility] OR ROOM 01 Gender: Female Note Status: Finalized Procedure:            Colonoscopy Indications:          Iron deficiency anemia Providers:            Lucilla Lame MD, MD Referring MD:         Halina Maidens, MD (Referring MD) Medicines:            Propofol per Anesthesia Complications:        No immediate complications. Procedure:            Pre-Anesthesia Assessment:                       - Prior to the procedure, a History and Physical was                        performed, and patient medications and allergies were                        reviewed. The patient's tolerance of previous                        anesthesia was also reviewed. The risks and benefits of                        the procedure and the sedation options and risks were                        discussed with the patient. All questions were                        answered, and informed consent was obtained. Prior                        Anticoagulants: The patient has taken no previous                        anticoagulant or antiplatelet agents. ASA Grade                        Assessment: II - A patient with mild systemic disease.                        After reviewing the risks and benefits, the patient was                        deemed in satisfactory condition to undergo the                        procedure.                       After obtaining informed consent, the colonoscope was                        passed under direct vision. Throughout the procedure,  the patient's blood pressure, pulse, and oxygen                        saturations were monitored continuously. The                        Colonoscope was introduced through the anus and                        advanced to the  the cecum, identified by appendiceal                        orifice and ileocecal valve. The colonoscopy was                        performed without difficulty. The patient tolerated the                        procedure well. The quality of the bowel preparation                        was excellent. Findings:      The perianal and digital rectal examinations were normal.      Three sessile polyps were found in the distal sigmoid colon. The polyps       were 1 to 2 mm in size. These polyps were removed with a cold biopsy       forceps. Resection and retrieval were complete.      Non-bleeding internal hemorrhoids were found during retroflexion. The       hemorrhoids were Grade I (internal hemorrhoids that do not prolapse). Impression:           - Three 1 to 2 mm polyps in the distal sigmoid colon,                        removed with a cold biopsy forceps. Resected and                        retrieved.                       - Non-bleeding internal hemorrhoids. Recommendation:       - Discharge patient to home.                       - Resume previous diet.                       - Continue present medications.                       - Await pathology results. Procedure Code(s):    --- Professional ---                       [id number], Colonoscopy, flexible; with biopsy, single or                        multiple Diagnosis Code(s):    --- Professional ---                       D50.9, Iron deficiency anemia, unspecified  D12.5, Benign neoplasm of sigmoid colon CPT copyright 2017 American Medical Association. All rights reserved. The codes documented in this report are preliminary and upon coder review may  be revised to meet current compliance requirements. Lucilla Lame MD, MD 12/05/2017 8:45:38 AM This report has been signed electronically. Number of Addenda: 0 Note Initiated On: 12/05/2017 8:32 AM Scope Withdrawal Time: 0 hours 6 minutes 25 seconds  Total Procedure Duration:  0 hours 8 minutes 48 seconds       [facility]

## 2017-12-05 NOTE — Anesthesia Preprocedure Evaluation (Signed)
Anesthesia Evaluation  Patient identified by MRN, date of birth, ID band  Reviewed: NPO status   History of Anesthesia Complications Negative for: history of anesthetic complications  Airway Mallampati: II  TM Distance: >3 FB Neck ROM: full    Dental  (+) Poor Dentition, Chipped, Upper Dentures, Loose,    Pulmonary    Pulmonary exam normal        Cardiovascular hypertension, + CAD and + Past MI ('mild' 5 years ago)  Normal cardiovascular exam     Neuro/Psych Depression negative neurological ROS     GI/Hepatic Neg liver ROS, GERD  Controlled,  Endo/Other  negative endocrine ROS  Renal/GU CRFRenal diseaseckd3  negative genitourinary   Musculoskeletal   Abdominal   Peds  Hematology  (+) anemia ,   Anesthesia Other Findings tiva  Reproductive/Obstetrics                             Anesthesia Physical Anesthesia Plan  ASA: II  Anesthesia Plan: General   Post-op Pain Management:    Induction:   PONV Risk Score and Plan:   Airway Management Planned:   Additional Equipment:   Intra-op Plan:   Post-operative Plan:   Informed Consent: I have reviewed the patients History and Physical, chart, labs and discussed the procedure including the risks, benefits and alternatives for the proposed anesthesia with the patient or authorized representative who has indicated his/her understanding and acceptance.     Plan Discussed with: CRNA  Anesthesia Plan Comments:         Anesthesia Quick Evaluation

## 2017-12-05 NOTE — Transfer of Care (Signed)
Immediate Anesthesia Transfer of Care Note  Patient: Vanessa Farmer  Procedure(s) Performed: COLONOSCOPY WITH PROPOFOL (N/A ) ESOPHAGOGASTRODUODENOSCOPY (EGD) WITH PROPOFOL (N/A )  Patient Location: PACU  Anesthesia Type: General  Level of Consciousness: awake, alert  and patient cooperative  Airway and Oxygen Therapy: Patient Spontanous Breathing and Patient connected to supplemental oxygen  Post-op Assessment: Post-op Vital signs reviewed, Patient's Cardiovascular Status Stable, Respiratory Function Stable, Patent Airway and No signs of Nausea or vomiting  Post-op Vital Signs: Reviewed and stable  Complications: No apparent anesthesia complications

## 2017-12-05 NOTE — Anesthesia Postprocedure Evaluation (Signed)
Anesthesia Post Note  Patient: Vanessa Farmer  Procedure(s) Performed: COLONOSCOPY WITH PROPOFOL (N/A Rectum) ESOPHAGOGASTRODUODENOSCOPY (EGD) WITH PROPOFOL (N/A Mouth) POLYPECTOMY  Patient location during evaluation: PACU Anesthesia Type: General Level of consciousness: awake and alert Pain management: pain level controlled Vital Signs Assessment: post-procedure vital signs reviewed and stable Respiratory status: spontaneous breathing, nonlabored ventilation, respiratory function stable and patient connected to nasal cannula oxygen Cardiovascular status: blood pressure returned to baseline and stable Postop Assessment: no apparent nausea or vomiting Anesthetic complications: no    Vanessa Farmer

## 2017-12-05 NOTE — H&P (Signed)
Lucilla Lame, MD [facility] [address], [place], [place] [address] [phone] Fax : [phone]  Primary Care Physician:  Glean Hess, MD Primary Gastroenterologist:  Dr. Allen Norris  Pre-Procedure History & Physical: HPI:  Vanessa Farmer is a 82 y.o. female is here for an endoscopy and colonoscopy.   Past Medical History:  Diagnosis Date  . Anemia   . Cataract   . Fall   . Hypertension   . Myocardial infarction [name]    "mild" many yrs ago    Past Surgical History:  Procedure Laterality Date  . ABDOMINAL HYSTERECTOMY    . TONSILLECTOMY      Prior to Admission medications   Medication Sig Start Date End Date Taking? Authorizing Provider  acetaminophen (TYLENOL) 500 MG tablet Take 2 tablets (1,000 mg total) by mouth 2 (two) times daily. 06/14/16   Plonk, Gwyndolyn Saxon, MD  amLODipine (NORVASC) 10 MG tablet Take 1 tablet (10 mg total) by mouth daily. 09/29/17   Glean Hess, MD  aspirin 325 MG tablet Take 325 mg by mouth daily.    [provider]  atorvastatin (LIPITOR) 40 MG tablet Take 1 tablet (40 mg total) by mouth daily. 06/10/17   Plonk, Gwyndolyn Saxon, MD  ENSURE (ENSURE) Take 237 mLs by mouth 2 (two) times daily between meals. 10/20/17 10/21/18  Glean Hess, MD  ferrous sulfate 325 (65 FE) MG EC tablet Take 1 tablet (325 mg total) by mouth 2 (two) times daily with a meal. 11/19/17   Earlie Server, MD  lisinopril-hydrochlorothiazide (PRINZIDE,ZESTORETIC) 20-12.5 MG tablet Take 2 tablets by mouth daily. 09/29/17   Glean Hess, MD  Multiple Vitamins-Calcium (ONE-A-DAY WOMENS PO) Take 1 tablet by mouth daily.    [provider]  polyethylene glycol (GOLYTELY) 236 g solution Drink one 8 oz glass every 20 mins until entire container is finished. 12/02/17   Lucilla Lame, MD  ranitidine (ZANTAC) 150 MG tablet Take 1 tablet (150 mg total) by mouth 2 (two) times daily as needed for heartburn. 09/29/17   Glean Hess, MD  sertraline (ZOLOFT) 50 MG tablet  Take 1 tablet (50 mg total) by mouth daily. 09/29/17   Glean Hess, MD    Allergies as of 11/28/2017  . (No Known Allergies)    Family History  Problem Relation Age of Onset  . Heart disease Mother   . Brain cancer Mother   . Heart attack Father   . Liver disease Daughter   . Throat cancer Brother   . Cancer Maternal Grandfather        Unsure type    Social History   Socioeconomic History  . Marital status: Widowed    Spouse name: Not on file  . Number of children: 4  . Years of education: Not on file  . Highest education level: 9th grade  Occupational History  . Occupation: Retired  Scientific laboratory technician  . Financial resource strain: Not hard at all  . Food insecurity:    Worry: Never true    Inability: Never true  . Transportation needs:    Medical: No    Non-medical: No  Tobacco Use  . Smoking status: Never Smoker  . Smokeless tobacco: Never Used  . Tobacco comment: smoking cessation materials not required  Substance and Sexual Activity  . Alcohol use: No  . Drug use: No  . Sexual activity: Not Currently  Lifestyle  . Physical activity:    Days per week: 0 days    Minutes  per session: 0 min  . Stress: Not at all  Relationships  . Social connections:    Talks on phone: Patient refused    Gets together: Patient refused    Attends religious service: Patient refused    Active member of club or organization: Patient refused    Attends meetings of clubs or organizations: Patient refused    Relationship status: Widowed  . Intimate partner violence:    Fear of current or ex partner: No    Emotionally abused: No    Physically abused: No    Forced sexual activity: No  Other Topics Concern  . Not on file  Social History Narrative  . Not on file    Review of Systems: See HPI, otherwise negative ROS  Physical Exam: Ht 5' (1.524 m)   Wt 107 lb (48.5 kg)   BMI 20.90 kg/m  General:   Alert,  pleasant and cooperative in NAD Head:  Normocephalic and  atraumatic. Neck:  Supple; no masses or thyromegaly. Lungs:  Clear throughout to auscultation.    Heart:  Regular rate and rhythm. Abdomen:  Soft, nontender and nondistended. Normal bowel sounds, without guarding, and without rebound.   Neurologic:  Alert and  oriented x4;  grossly normal neurologically.  Impression/Plan: Vanessa Farmer is here for an endoscopy and colonoscopy to be performed for IDA  Risks, benefits, limitations, and alternatives regarding  endoscopy and colonoscopy have been reviewed with the patient.  Questions have been answered.  All parties agreeable.   Lucilla Lame, MD  12/05/2017, 7:34 AM

## 2017-12-06 ENCOUNTER — Encounter: Payer: Self-pay | Admitting: Gastroenterology

## 2017-12-07 ENCOUNTER — Ambulatory Visit: Payer: Medicare Other | Admitting: Internal Medicine

## 2017-12-08 ENCOUNTER — Encounter: Payer: Self-pay | Admitting: Gastroenterology

## 2017-12-08 ENCOUNTER — Ambulatory Visit: Payer: Medicare HMO | Admitting: Family Medicine

## 2018-01-05 ENCOUNTER — Ambulatory Visit: Payer: Medicare Other | Admitting: Internal Medicine

## 2018-01-16 ENCOUNTER — Ambulatory Visit: Payer: Medicare Other | Admitting: Internal Medicine

## 2018-01-16 NOTE — Progress Notes (Deleted)
    Date:  01/16/2018   Name:  Vanessa Farmer   DOB:  [birth year]   MRN:  [id number]   Chief Complaint: No chief complaint on file. Hypertension  This is a chronic problem. The problem is controlled. Past treatments include calcium channel blockers, diuretics and ACE inhibitors. Hypertensive end-organ damage includes kidney disease.  Depression         This is a chronic problem.The problem is unchanged.  Past treatments include SSRIs - Selective serotonin reuptake inhibitors.  Previous treatment provided significant relief. Gastroesophageal Reflux  The problem occurs occasionally. She has tried a histamine-2 antagonist for the symptoms.   CKD - not seeing Nephrology.  Last GFR 42.   Anemia work up - seen by Oncology.  Recent GI eval with colonoscopy showed only hyperplastic polyp.  She has a follow up with Oncology later this week.  Labs have been normal except for mild pancytopenia.    Review of Systems  Psychiatric/Behavioral: Positive for depression.    Patient Active Problem List   Diagnosis Date Noted  . Iron deficiency anemia   . Polyp of sigmoid colon   . Reflux esophagitis   . Pancytopenia ([place]) 09/30/2017  . GERD (gastroesophageal reflux disease) 06/10/2017  . CKD (chronic kidney disease) stage 3, GFR 30-59 ml/min (HCC) 06/08/2016  . Hyperlipidemia 06/08/2016  . Scoliosis 06/07/2016  . Osteoporosis 06/07/2016  . Hypertension 06/07/2016  . CAD (coronary artery disease) 06/07/2016  . Depression, major, single episode, moderate ([place]) 06/07/2016    No Known Allergies  Past Surgical History:  Procedure Laterality Date  . ABDOMINAL HYSTERECTOMY    . COLONOSCOPY WITH PROPOFOL N/A 12/05/2017   Procedure: COLONOSCOPY WITH PROPOFOL;  Surgeon: Lucilla Lame, MD;  Location: [place];  Service: Endoscopy;  Laterality: N/A;  . ESOPHAGOGASTRODUODENOSCOPY (EGD) WITH PROPOFOL N/A 12/05/2017   Procedure: ESOPHAGOGASTRODUODENOSCOPY (EGD) WITH PROPOFOL;  Surgeon: Lucilla Lame,  MD;  Location: [place];  Service: Endoscopy;  Laterality: N/A;  . POLYPECTOMY  12/05/2017   Procedure: POLYPECTOMY;  Surgeon: Lucilla Lame, MD;  Location: [place];  Service: Endoscopy;;  . TONSILLECTOMY      Social History   Tobacco Use  . Smoking status: Never Smoker  . Smokeless tobacco: Never Used  . Tobacco comment: smoking cessation materials not required  Substance Use Topics  . Alcohol use: No  . Drug use: No     Medication list has been reviewed and updated.  No outpatient medications have been marked as taking for the 01/16/18 encounter (Appointment) with Glean Hess, MD.    [facility] 2/9 Scores 10/24/2017 09/29/2017 06/01/2017 01/31/2017  PHQ - 2 Score 0 5 0 0  PHQ- 9 Score 2 9 - 1    Physical Exam  There were no vitals taken for this visit.  Assessment and Plan:  1. Essential hypertension ***  2. Depression, major, single episode, moderate (HCC) ***  3. Gastroesophageal reflux disease, esophagitis presence not specified ***  4. CKD (chronic kidney disease) stage 3, GFR 30-59 ml/min (HCC) ***

## 2018-01-17 ENCOUNTER — Encounter: Payer: Self-pay | Admitting: Internal Medicine

## 2018-01-17 ENCOUNTER — Ambulatory Visit (INDEPENDENT_AMBULATORY_CARE_PROVIDER_SITE_OTHER): Payer: Medicare Other | Admitting: Internal Medicine

## 2018-01-17 ENCOUNTER — Inpatient Hospital Stay: Payer: Medicare Other

## 2018-01-17 VITALS — BP 108/68 | HR 86 | Ht 60.0 in | Wt 109.5 lb

## 2018-01-17 DIAGNOSIS — F321 Major depressive disorder, single episode, moderate: Secondary | ICD-10-CM

## 2018-01-17 DIAGNOSIS — Z23 Encounter for immunization: Secondary | ICD-10-CM | POA: Diagnosis not present

## 2018-01-17 DIAGNOSIS — E785 Hyperlipidemia, unspecified: Secondary | ICD-10-CM

## 2018-01-17 DIAGNOSIS — I2510 Atherosclerotic heart disease of native coronary artery without angina pectoris: Secondary | ICD-10-CM | POA: Diagnosis not present

## 2018-01-17 DIAGNOSIS — N183 Chronic kidney disease, stage 3 unspecified: Secondary | ICD-10-CM

## 2018-01-17 DIAGNOSIS — I10 Essential (primary) hypertension: Secondary | ICD-10-CM

## 2018-01-17 MED ORDER — AMLODIPINE BESYLATE 10 MG PO TABS: 10.0000 mg | ORAL_TABLET | Freq: Every day | ORAL | 5 refills | Status: DC

## 2018-01-17 MED ORDER — LISINOPRIL-HYDROCHLOROTHIAZIDE 20-12.5 MG PO TABS: 2.0000 | ORAL_TABLET | Freq: Every day | ORAL | 5 refills | Status: DC

## 2018-01-17 MED ORDER — SERTRALINE HCL 50 MG PO TABS: 50.0000 mg | ORAL_TABLET | Freq: Every day | ORAL | 5 refills | Status: DC

## 2018-01-17 NOTE — Patient Instructions (Signed)

## 2018-01-17 NOTE — Progress Notes (Signed)
Date:  01/17/2018   Name:  Vanessa Farmer   DOB:  [birth year]   MRN:  [id number]   Chief Complaint: Hypertension; Depression; and Hyperlipidemia Hypertension  This is a chronic problem. The problem is controlled. Pertinent negatives include no chest pain, headaches, palpitations or shortness of breath. Past treatments include ACE inhibitors, diuretics and calcium channel blockers. The current treatment provides significant improvement. Hypertensive end-organ damage includes kidney disease.  Depression         This is a chronic problem.The problem is unchanged.  Associated symptoms include no appetite change and no headaches.  Past treatments include SSRIs - Selective serotonin reuptake inhibitors.  Previous treatment provided significant relief. Hyperlipidemia  This is a chronic problem. The problem is controlled. Pertinent negatives include no chest pain or shortness of breath. Current antihyperlipidemic treatment includes statins. The current treatment provides significant improvement of lipids.  Gastroesophageal Reflux  She reports no abdominal pain, no chest pain, no coughing or no heartburn. The problem occurs rarely. She has tried a PPI for the symptoms. The treatment provided significant relief.   Lab Results  Component Value Date   CREATININE 1.34 (H) 09/29/2017   BUN 29 (H) 09/29/2017   NA 138 09/29/2017   K 3.7 09/29/2017   CL 100 (L) 09/29/2017   CO2 28 09/29/2017   Lab Results  Component Value Date   CHOL 181 10/26/2016   HDL 53 10/26/2016   LDLCALC 108 (H) 10/26/2016   TRIG 99 10/26/2016   CHOLHDL 3.4 10/26/2016      Review of Systems  Constitutional: Negative for appetite change, diaphoresis and fever.  Respiratory: Negative for cough, chest tightness and shortness of breath.   Cardiovascular: Negative for chest pain and palpitations.  Gastrointestinal: Negative for abdominal pain and heartburn.  Musculoskeletal: Positive for arthralgias and gait problem.    Skin: Negative for rash.  Neurological: Negative for dizziness and headaches.  Psychiatric/Behavioral: Positive for depression. Negative for dysphoric mood and sleep disturbance. The patient is not nervous/anxious.     Patient Active Problem List   Diagnosis Date Noted  . Iron deficiency anemia   . Polyp of sigmoid colon   . Reflux esophagitis   . Pancytopenia ([place]) 09/30/2017  . GERD (gastroesophageal reflux disease) 06/10/2017  . CKD (chronic kidney disease) stage 3, GFR 30-59 ml/min (HCC) 06/08/2016  . Hyperlipidemia 06/08/2016  . Scoliosis 06/07/2016  . Osteoporosis 06/07/2016  . Essential hypertension 06/07/2016  . CAD (coronary artery disease) 06/07/2016  . Depression, major, single episode, moderate ([place]) 06/07/2016    No Known Allergies  Past Surgical History:  Procedure Laterality Date  . ABDOMINAL HYSTERECTOMY    . COLONOSCOPY WITH PROPOFOL N/A 12/05/2017   Procedure: COLONOSCOPY WITH PROPOFOL;  Surgeon: Lucilla Lame, MD;  Location: [place];  Service: Endoscopy;  Laterality: N/A;  . ESOPHAGOGASTRODUODENOSCOPY (EGD) WITH PROPOFOL N/A 12/05/2017   Procedure: ESOPHAGOGASTRODUODENOSCOPY (EGD) WITH PROPOFOL;  Surgeon: Lucilla Lame, MD;  Location: [place];  Service: Endoscopy;  Laterality: N/A;  . POLYPECTOMY  12/05/2017   Procedure: POLYPECTOMY;  Surgeon: Lucilla Lame, MD;  Location: [place];  Service: Endoscopy;;  . TONSILLECTOMY      Social History   Tobacco Use  . Smoking status: Never Smoker  . Smokeless tobacco: Never Used  . Tobacco comment: smoking cessation materials not required  Substance Use Topics  . Alcohol use: No  . Drug use: No     Medication list has been reviewed and updated.  Current Meds  Medication Sig  . acetaminophen (TYLENOL) 500 MG tablet Take 2 tablets (1,000 mg total) by mouth 2 (two) times daily.  Marland Kitchen amLODipine (NORVASC) 10 MG tablet Take 1 tablet (10 mg total) by mouth daily.  Marland Kitchen aspirin 325 MG  tablet Take 325 mg by mouth daily.  Marland Kitchen atorvastatin (LIPITOR) 40 MG tablet Take 1 tablet (40 mg total) by mouth daily.  Marland Kitchen ENSURE (ENSURE) Take 237 mLs by mouth 2 (two) times daily between meals.  . ferrous sulfate 325 (65 FE) MG EC tablet Take 1 tablet (325 mg total) by mouth 2 (two) times daily with a meal.  . lisinopril-hydrochlorothiazide (PRINZIDE,ZESTORETIC) 20-12.5 MG tablet Take 2 tablets by mouth daily.  . Multiple Vitamins-Calcium (ONE-A-DAY WOMENS PO) Take 1 tablet by mouth daily.  . pantoprazole (PROTONIX) 40 MG tablet TAKE 1 TABLET(40 MG) BY MOUTH DAILY  . polyethylene glycol (GOLYTELY) 236 g solution Drink one 8 oz glass every 20 mins until entire container is finished.  . sertraline (ZOLOFT) 50 MG tablet Take 1 tablet (50 mg total) by mouth daily.    PHQ 2/9 Scores 01/17/2018 10/24/2017 09/29/2017 06/01/2017  PHQ - 2 Score 1 0 5 0  PHQ- 9 Score - 2 9 -    Physical Exam  Constitutional: She is oriented to person, place, and time. She appears well-developed. No distress.  HENT:  Head: Normocephalic and atraumatic.  Neck: Normal range of motion. Neck supple.  Cardiovascular: Normal rate, regular rhythm and normal heart sounds.  Pulmonary/Chest: Effort normal and breath sounds normal. No respiratory distress.  Musculoskeletal: She exhibits no edema.  Neurological: She is alert and oriented to person, place, and time.  Skin: Skin is warm and dry. No rash noted.  Psychiatric: She has a normal mood and affect. Her behavior is normal. Thought content normal.  Nursing note and vitals reviewed.   BP 108/68 (BP Location: Right Arm, Patient Position: Sitting, Cuff Size: Normal)   Pulse 86   Ht 5' (1.524 m)   Wt 109 lb 8 oz (49.7 kg)   SpO2 97%   BMI 21.39 kg/m   Assessment and Plan: 1. Essential hypertension Controlled, continue current therapy - lisinopril-hydrochlorothiazide (PRINZIDE,ZESTORETIC) 20-12.5 MG tablet; Take 2 tablets by mouth daily.  Dispense: 60 tablet; Refill:  5 - amLODipine (NORVASC) 10 MG tablet; Take 1 tablet (10 mg total) by mouth daily.  Dispense: 30 tablet; Refill: 5  2. Depression, major, single episode, moderate (HCC) Doing well on medication - sertraline (ZOLOFT) 50 MG tablet; Take 1 tablet (50 mg total) by mouth daily.  Dispense: 30 tablet; Refill: 5  3. CKD (chronic kidney disease) stage 3, GFR 30-59 ml/min (HCC) Continue to monitor, avoid nsaids  4. Hyperlipidemia, unspecified hyperlipidemia type Continue statin therapy - Lipid panel  5. Need for pneumococcal vaccination - Pneumococcal polysaccharide vaccine 23-valent greater than or equal to 2yo subcutaneous/IM   Meds ordered this encounter  Medications  . sertraline (ZOLOFT) 50 MG tablet    Sig: Take 1 tablet (50 mg total) by mouth daily.    Dispense:  30 tablet    Refill:  5  . lisinopril-hydrochlorothiazide (PRINZIDE,ZESTORETIC) 20-12.5 MG tablet    Sig: Take 2 tablets by mouth daily.    Dispense:  60 tablet    Refill:  5  . amLODipine (NORVASC) 10 MG tablet    Sig: Take 1 tablet (10 mg total) by mouth daily.    Dispense:  30 tablet    Refill:  5    Partially dictated using  Editor, commissioning. Any errors are unintentional.  Halina Maidens, MD [facility]  01/17/2018

## 2018-01-18 ENCOUNTER — Inpatient Hospital Stay: Payer: Medicare Other | Attending: Oncology

## 2018-01-18 DIAGNOSIS — K210 Gastro-esophageal reflux disease with esophagitis: Secondary | ICD-10-CM | POA: Insufficient documentation

## 2018-01-18 DIAGNOSIS — D649 Anemia, unspecified: Secondary | ICD-10-CM

## 2018-01-18 DIAGNOSIS — D509 Iron deficiency anemia, unspecified: Secondary | ICD-10-CM | POA: Diagnosis not present

## 2018-01-18 DIAGNOSIS — D61818 Other pancytopenia: Secondary | ICD-10-CM

## 2018-01-18 DIAGNOSIS — Z79899 Other long term (current) drug therapy: Secondary | ICD-10-CM | POA: Diagnosis not present

## 2018-01-18 LAB — CBC WITH DIFFERENTIAL/PLATELET
BASOS ABS: 0 10*3/uL (ref 0–0.1)
Basophils Relative: 1 %
Eosinophils Absolute: 0.2 10*3/uL (ref 0–0.7)
Eosinophils Relative: 3 %
HEMATOCRIT: 33.2 % — AB (ref 35.0–47.0)
Hemoglobin: 11 g/dL — ABNORMAL LOW (ref 12.0–16.0)
LYMPHS ABS: 1 10*3/uL (ref 1.0–3.6)
LYMPHS PCT: 18 %
MCH: 32.2 pg (ref 26.0–34.0)
MCHC: 33.2 g/dL (ref 32.0–36.0)
MCV: 97 fL (ref 80.0–100.0)
MONO ABS: 0.5 10*3/uL (ref 0.2–0.9)
Monocytes Relative: 9 %
NEUTROS ABS: 3.6 10*3/uL (ref 1.4–6.5)
Neutrophils Relative %: 69 %
Platelets: 158 10*3/uL (ref 150–440)
RBC: 3.42 MIL/uL — ABNORMAL LOW (ref 3.80–5.20)
RDW: 14 % (ref 11.5–14.5)
WBC: 5.2 10*3/uL (ref 3.6–11.0)

## 2018-01-18 LAB — LIPID PANEL
CHOL/HDL RATIO: 2.5 ratio (ref 0.0–4.4)
Cholesterol, Total: 135 mg/dL (ref 100–199)
HDL: 53 mg/dL (ref >39)
LDL CALC: 64 mg/dL (ref 0–99)
Triglycerides: 90 mg/dL (ref 0–149)
VLDL CHOLESTEROL CAL: 18 mg/dL (ref 5–40)

## 2018-01-18 LAB — IRON AND TIBC
IRON: 60 ug/dL (ref 28–170)
Saturation Ratios: 19 % (ref 10.4–31.8)
TIBC: 320 ug/dL (ref 250–450)
UIBC: 260 ug/dL

## 2018-01-18 LAB — FERRITIN: Ferritin: 34 ng/mL (ref 11–307)

## 2018-01-19 ENCOUNTER — Other Ambulatory Visit: Payer: Self-pay

## 2018-01-19 ENCOUNTER — Inpatient Hospital Stay (HOSPITAL_BASED_OUTPATIENT_CLINIC_OR_DEPARTMENT_OTHER): Payer: Medicare Other | Admitting: Oncology

## 2018-01-19 ENCOUNTER — Encounter: Payer: Self-pay | Admitting: Oncology

## 2018-01-19 VITALS — BP 141/65 | HR 76 | Temp 97.0°F | Resp 18 | Wt 108.9 lb

## 2018-01-19 DIAGNOSIS — D509 Iron deficiency anemia, unspecified: Secondary | ICD-10-CM | POA: Diagnosis not present

## 2018-01-19 DIAGNOSIS — K2100 Gastro-esophageal reflux disease with esophagitis, without bleeding: Secondary | ICD-10-CM

## 2018-01-19 DIAGNOSIS — Z79899 Other long term (current) drug therapy: Secondary | ICD-10-CM | POA: Diagnosis not present

## 2018-01-19 DIAGNOSIS — D500 Iron deficiency anemia secondary to blood loss (chronic): Secondary | ICD-10-CM

## 2018-01-19 MED ORDER — FERROUS SULFATE 325 (65 FE) MG PO TBEC: 325.0000 mg | DELAYED_RELEASE_TABLET | Freq: Two times a day (BID) | ORAL | 2 refills | Status: DC

## 2018-01-19 MED ORDER — VITAMIN C 500 MG PO TABS: 500.0000 mg | ORAL_TABLET | Freq: Every day | ORAL | 2 refills | Status: DC

## 2018-01-19 MED ORDER — NYSTATIN 100000 UNIT/GM EX POWD: Freq: Two times a day (BID) | CUTANEOUS | 0 refills | Status: DC

## 2018-01-19 NOTE — Progress Notes (Signed)
Patient here for follow up. States she had stomach virus about 2 weeks ago.

## 2018-01-19 NOTE — Progress Notes (Signed)
Hematology/Oncology Follow up  note [facility] [phone] Fax:([phone]) [phone]   Patient Care Team: Glean Hess, MD as PCP - General (Internal Medicine) Lucilla Lame, MD as Consulting Physician (Gastroenterology) Earlie Server, MD as Consulting Physician (Oncology)  REFERRING PROVIDER: Glean Hess, MD REASON FOR VISIT Follow up for treatment of iron deficiency anemia.   HISTORY OF PRESENTING ILLNESS:  Vanessa Farmer is a  82 y.o.  female with PMH listed below who was referred to me for evaluation of pancytopenia.  Patient recently had lab work done which revealed pancytopenia with platelet counts 146,000, Hemoglobin 10.9, WBC 2.3, ANC 1.1 Patient reports  fatigue, denies weight loss, easy bruising, hematochezia, hemoptysis History of DVT: Denies  INTERVAL HISTORY Vanessa Farmer is a 82 y.o. female who has above history reviewed by me today presents for follow up visit of iron deficiency anemia.   Problems and complaints are listed below: Fatigue: stable. At basline.  Weight loss: she has gained weight since last visit.  GERD, patient was referred to GI and was evaluated. She had upper and lower endoscopy on 12/05/2017. She had esophagitis, was started on PPI. Benign sigmoid colon polyps and negative for celiac disease.  Patient reports her acid reflux symptom has improved.   Review of Systems  Constitutional: Positive for malaise/fatigue. Negative for chills, fever and weight loss.  HENT: Negative for congestion, ear discharge, ear pain, nosebleeds, sinus pain and sore throat.   Eyes: Negative for double vision, photophobia, pain, discharge and redness.  Respiratory: Negative for cough, hemoptysis, sputum production, shortness of breath and wheezing.   Cardiovascular: Negative for chest pain, palpitations, orthopnea, claudication and leg swelling.  Gastrointestinal: Negative for abdominal pain, blood in stool, constipation, diarrhea,  heartburn, melena, nausea and vomiting.  Genitourinary: Negative for dysuria, flank pain, frequency and hematuria.  Musculoskeletal: Negative for back pain, myalgias and neck pain.  Skin: Negative for itching and rash.  Neurological: Negative for dizziness, tingling, tremors, focal weakness, weakness and headaches.  Endo/Heme/Allergies: Negative for environmental allergies. Does not bruise/bleed easily.  Psychiatric/Behavioral: Negative for depression and hallucinations. The patient is not nervous/anxious.     MEDICAL HISTORY:  Past Medical History:  Diagnosis Date  . Anemia   . Cataract   . Fall   . Hypertension   . Myocardial infarction ([place])    "mild" many yrs ago    SURGICAL HISTORY: Past Surgical History:  Procedure Laterality Date  . ABDOMINAL HYSTERECTOMY    . COLONOSCOPY WITH PROPOFOL N/A 12/05/2017   Procedure: COLONOSCOPY WITH PROPOFOL;  Surgeon: Lucilla Lame, MD;  Location: [place];  Service: Endoscopy;  Laterality: N/A;  . ESOPHAGOGASTRODUODENOSCOPY (EGD) WITH PROPOFOL N/A 12/05/2017   Procedure: ESOPHAGOGASTRODUODENOSCOPY (EGD) WITH PROPOFOL;  Surgeon: Lucilla Lame, MD;  Location: [place];  Service: Endoscopy;  Laterality: N/A;  . POLYPECTOMY  12/05/2017   Procedure: POLYPECTOMY;  Surgeon: Lucilla Lame, MD;  Location: [place];  Service: Endoscopy;;  . TONSILLECTOMY      SOCIAL HISTORY: Social History   Socioeconomic History  . Marital status: Widowed    Spouse name: Not on file  . Number of children: 4  . Years of education: Not on file  . Highest education level: 9th grade  Occupational History  . Occupation: Retired  Scientific laboratory technician  . Financial resource strain: Not hard at all  . Food insecurity:    Worry: Never true    Inability: Never true  . Transportation needs:    Medical:  No    Non-medical: No  Tobacco Use  . Smoking status: Never Smoker  . Smokeless tobacco: Never Used  . Tobacco comment: smoking cessation  materials not required  Substance and Sexual Activity  . Alcohol use: No  . Drug use: No  . Sexual activity: Not Currently  Lifestyle  . Physical activity:    Days per week: 0 days    Minutes per session: 0 min  . Stress: Not at all  Relationships  . Social connections:    Talks on phone: Patient refused    Gets together: Patient refused    Attends religious service: Patient refused    Active member of club or organization: Patient refused    Attends meetings of clubs or organizations: Patient refused    Relationship status: Widowed  . Intimate partner violence:    Fear of current or ex partner: No    Emotionally abused: No    Physically abused: No    Forced sexual activity: No  Other Topics Concern  . Not on file  Social History Narrative  . Not on file    FAMILY HISTORY: Family History  Problem Relation Age of Onset  . Heart disease Mother   . Brain cancer Mother   . Heart attack Father   . Liver disease Daughter   . Throat cancer Brother   . Cancer Maternal Grandfather        Unsure type    ALLERGIES:  has No Known Allergies.  MEDICATIONS:  Current Outpatient Medications  Medication Sig Dispense Refill  . acetaminophen (TYLENOL) 500 MG tablet Take 2 tablets (1,000 mg total) by mouth 2 (two) times daily. 30 tablet   . amLODipine (NORVASC) 10 MG tablet Take 1 tablet (10 mg total) by mouth daily. 30 tablet 5  . aspirin 325 MG tablet Take 325 mg by mouth daily.    Marland Kitchen atorvastatin (LIPITOR) 40 MG tablet Take 1 tablet (40 mg total) by mouth daily. 90 tablet 3  . ENSURE (ENSURE) Take 237 mLs by mouth 2 (two) times daily between meals. 237 mL 12  . ferrous sulfate 325 (65 FE) MG EC tablet Take 1 tablet (325 mg total) by mouth 2 (two) times daily with a meal. 60 tablet 2  . lisinopril-hydrochlorothiazide (PRINZIDE,ZESTORETIC) 20-12.5 MG tablet Take 2 tablets by mouth daily. 60 tablet 5  . Multiple Vitamins-Calcium (ONE-A-DAY WOMENS PO) Take 1 tablet by mouth daily.    .  pantoprazole (PROTONIX) 40 MG tablet TAKE 1 TABLET(40 MG) BY MOUTH DAILY 90 tablet 2  . sertraline (ZOLOFT) 50 MG tablet Take 1 tablet (50 mg total) by mouth daily. 30 tablet 5  . nystatin (MYCOSTATIN/NYSTOP) powder Apply topically 2 (two) times daily. 45 g 0  . polyethylene glycol (GOLYTELY) 236 g solution Drink one 8 oz glass every 20 mins until entire container is finished. (Patient not taking: Reported on 01/19/2018) 4000 mL 0  . vitamin C (ASCORBIC ACID) 500 MG tablet Take 1 tablet (500 mg total) by mouth daily. 30 tablet 2   No current facility-administered medications for this visit.      PHYSICAL EXAMINATION: ECOG PERFORMANCE STATUS: 1 - Symptomatic but completely ambulatory Vitals:   01/19/18 1035 01/19/18 1036  BP:  (!) 141/65  Pulse:  76  Resp:  18  Temp: (!) 97 F (36.1 C)    Filed Weights   01/19/18 1035  Weight: 108 lb 14.5 oz (49.4 kg)    Physical Exam  Constitutional: She is oriented to person,  place, and time. She appears well-developed and well-nourished. No distress.  HENT:  Head: Normocephalic and atraumatic.  Right Ear: External ear normal.  Left Ear: External ear normal.  Mouth/Throat: Oropharynx is clear and moist.  Eyes: Pupils are equal, round, and reactive to light. Conjunctivae and EOM are normal. No scleral icterus.  Neck: Normal range of motion. Neck supple.  Cardiovascular: Normal rate, regular rhythm and normal heart sounds.  Pulmonary/Chest: Effort normal and breath sounds normal. No respiratory distress. She has no wheezes. She has no rales. She exhibits no tenderness.  Abdominal: Soft. Bowel sounds are normal. She exhibits no distension and no mass. There is no tenderness.  Musculoskeletal: Normal range of motion. She exhibits no edema or deformity.  Lymphadenopathy:    She has no cervical adenopathy.  Neurological: She is alert and oriented to person, place, and time. No cranial nerve deficit. Coordination normal.  Skin: Skin is warm and dry.  No rash noted. No erythema.  Psychiatric: She has a normal mood and affect. Her behavior is normal. Thought content normal.     LABORATORY DATA:  I have reviewed the data as listed Lab Results  Component Value Date   WBC 5.2 01/18/2018   HGB 11.0 (L) 01/18/2018   HCT 33.2 (L) 01/18/2018   MCV 97.0 01/18/2018   PLT 158 01/18/2018   Recent Labs    06/10/17 1153 09/29/17 1122  NA 138 138  K 3.9 3.7  CL 96 100*  CO2 27 28  GLUCOSE 96 96  BUN 18 29*  CREATININE 1.12* 1.34*  CALCIUM 9.3 8.7*  GFRNONAA 47* 36*  GFRAA 54* 42*  PROT  --  7.3  ALBUMIN  --  4.1  AST  --  26  ALT  --  22  ALKPHOS  --  66  BILITOT  --  0.6   Normal vitamin B12 and folate level.  Negative flow cytometry.    ASSESSMENT & PLAN:  1. Iron deficiency anemia due to chronic blood loss   2. GERD with esophagitis    #Iron deficiency anemia,  Labs reviewed and discussed with patient. Iron panel has improved. Hemoglobin stable.  Recommend patient to continue take oral iron supplementation 325mg  BID. Can add vitamin C as well.   # GERD with esophagitis,  Recommend patient to follow-up with me in 3 months with repeat CBC CMP iron panel.   All questions were answered. The patient knows to call the clinic with any problems questions or concerns. Orders Placed This Encounter  Procedures  . CBC with Differential/Platelet    Standing Status:   Future    Standing Expiration Date:   01/19/2019  . Ferritin    Standing Status:   Future    Standing Expiration Date:   01/20/2019  . Iron and TIBC    Standing Status:   Future    Standing Expiration Date:   01/20/2019   Return of visit: 3 months Total face to face encounter time for this patient visit was 15 min. >50% of the time was  spent in counseling and coordination of care.    Earlie Server, MD, PhD Hematology Oncology [facility] Pager- [phone]

## 2018-01-30 ENCOUNTER — Ambulatory Visit: Payer: Medicare Other | Admitting: Internal Medicine

## 2018-03-10 ENCOUNTER — Ambulatory Visit: Payer: Medicare Other | Admitting: Internal Medicine

## 2018-03-12 ENCOUNTER — Other Ambulatory Visit: Payer: Self-pay | Admitting: Oncology

## 2018-03-13 ENCOUNTER — Encounter: Payer: Self-pay | Admitting: Internal Medicine

## 2018-03-13 ENCOUNTER — Ambulatory Visit (INDEPENDENT_AMBULATORY_CARE_PROVIDER_SITE_OTHER): Payer: Medicare Other | Admitting: Internal Medicine

## 2018-03-13 VITALS — BP 114/78 | HR 87 | Temp 98.2°F | Ht 60.0 in | Wt 111.0 lb

## 2018-03-13 DIAGNOSIS — I2510 Atherosclerotic heart disease of native coronary artery without angina pectoris: Secondary | ICD-10-CM

## 2018-03-13 DIAGNOSIS — J209 Acute bronchitis, unspecified: Secondary | ICD-10-CM | POA: Diagnosis not present

## 2018-03-13 MED ORDER — GUAIFENESIN-CODEINE 100-10 MG/5ML PO SYRP: 5.0000 mL | ORAL_SOLUTION | Freq: Three times a day (TID) | ORAL | 0 refills | Status: DC | PRN

## 2018-03-13 MED ORDER — AZITHROMYCIN 250 MG PO TABS: ORAL_TABLET | ORAL | 0 refills | Status: AC

## 2018-03-13 NOTE — Progress Notes (Signed)
Date:  03/13/2018   Name:  Vanessa Farmer   DOB:  [birth year]   MRN:  [id number]   Chief Complaint: Cough (Started 2 weeks ago. Coughing mucous that gets stuck in throat. Makes her vomit. Tried mucinex and OTC cough meds. SOB. Nasal congestion. Tried two nebulizer treatments which helped with wheezing.  )  Cough  This is a new problem. The current episode started 1 to 4 weeks ago. The problem has been unchanged. The problem occurs every few minutes. The cough is productive of sputum. Associated symptoms include postnasal drip and wheezing. Pertinent negatives include no chest pain, chills, fever, headaches or sore throat. She has tried a beta-agonist inhaler and OTC cough suppressant for the symptoms. The treatment provided mild relief.    Review of Systems  Constitutional: Negative for chills, fatigue and fever.  HENT: Positive for postnasal drip. Negative for sore throat and trouble swallowing.   Respiratory: Positive for cough and wheezing.   Cardiovascular: Negative for chest pain and palpitations.  Gastrointestinal: Negative for abdominal pain.  Neurological: Negative for dizziness and headaches.    Patient Active Problem List   Diagnosis Date Noted  . Iron deficiency anemia   . Polyp of sigmoid colon   . Reflux esophagitis   . Pancytopenia ([place]) 09/30/2017  . GERD (gastroesophageal reflux disease) 06/10/2017  . CKD (chronic kidney disease) stage 3, GFR 30-59 ml/min (HCC) 06/08/2016  . Hyperlipidemia 06/08/2016  . Scoliosis 06/07/2016  . Osteoporosis 06/07/2016  . Essential hypertension 06/07/2016  . CAD (coronary artery disease) 06/07/2016  . Depression, major, single episode, moderate ([place]) 06/07/2016    No Known Allergies  Past Surgical History:  Procedure Laterality Date  . ABDOMINAL HYSTERECTOMY    . COLONOSCOPY WITH PROPOFOL N/A 12/05/2017   Procedure: COLONOSCOPY WITH PROPOFOL;  Surgeon: Lucilla Lame, MD;  Location: [place];  Service: Endoscopy;   Laterality: N/A;  . ESOPHAGOGASTRODUODENOSCOPY (EGD) WITH PROPOFOL N/A 12/05/2017   Procedure: ESOPHAGOGASTRODUODENOSCOPY (EGD) WITH PROPOFOL;  Surgeon: Lucilla Lame, MD;  Location: [facility];  Service: Endoscopy;  Laterality: N/A;  . POLYPECTOMY  12/05/2017   Procedure: POLYPECTOMY;  Surgeon: Lucilla Lame, MD;  Location: [facility] [place];  Service: Endoscopy;;  . TONSILLECTOMY      Social History   Tobacco Use  . Smoking status: Never Smoker  . Smokeless tobacco: Never Used  . Tobacco comment: smoking cessation materials not required  Substance Use Topics  . Alcohol use: No  . Drug use: No     Medication list has been reviewed and updated.  Current Meds  Medication Sig  . acetaminophen (TYLENOL) 500 MG tablet Take 2 tablets (1,000 mg total) by mouth 2 (two) times daily.  Marland Kitchen amLODipine (NORVASC) 10 MG tablet Take 1 tablet (10 mg total) by mouth daily.  Marland Kitchen aspirin 325 MG tablet Take 325 mg by mouth daily.  Marland Kitchen atorvastatin (LIPITOR) 40 MG tablet Take 1 tablet (40 mg total) by mouth daily.  Marland Kitchen ENSURE (ENSURE) Take 237 mLs by mouth 2 (two) times daily between meals.  . FEROSUL 325 (65 Fe) MG tablet TAKE 1 TABLET BY MOUTH TWICE DAILY WITH A MEAL  . lisinopril-hydrochlorothiazide (PRINZIDE,ZESTORETIC) 20-12.5 MG tablet Take 2 tablets by mouth daily.  . Multiple Vitamins-Calcium (ONE-A-DAY WOMENS PO) Take 1 tablet by mouth daily.  Marland Kitchen nystatin (MYCOSTATIN/NYSTOP) powder Apply topically 2 (two) times daily.  . pantoprazole (PROTONIX) 40 MG tablet TAKE 1 TABLET(40 MG) BY MOUTH DAILY  . polyethylene glycol (GOLYTELY) 236 g solution  Drink one 8 oz glass every 20 mins until entire container is finished.  . sertraline (ZOLOFT) 50 MG tablet Take 1 tablet (50 mg total) by mouth daily.  . vitamin C (ASCORBIC ACID) 500 MG tablet Take 1 tablet (500 mg total) by mouth daily.    PHQ 2/9 Scores 01/17/2018 10/24/2017 09/29/2017 06/01/2017  PHQ - 2 Score 1 0 5 0  PHQ- 9 Score - 2 9 -     Physical Exam  Constitutional: She is oriented to person, place, and time. She appears well-developed. No distress.  HENT:  Head: Normocephalic and atraumatic.  Right Ear: Tympanic membrane normal.  Left Ear: Tympanic membrane normal.  Nose: Right sinus exhibits no maxillary sinus tenderness and no frontal sinus tenderness. Left sinus exhibits no maxillary sinus tenderness and no frontal sinus tenderness.  Mouth/Throat: No posterior oropharyngeal edema or posterior oropharyngeal erythema.  Neck: Normal range of motion. Neck supple.  Cardiovascular: Normal rate, regular rhythm and normal heart sounds.  Pulmonary/Chest: Effort normal. No respiratory distress. She has wheezes (few scattered). She has no rales.  Musculoskeletal: Normal range of motion.  Lymphadenopathy:    She has no cervical adenopathy.  Neurological: She is alert and oriented to person, place, and time.  Skin: Skin is warm and dry. No rash noted.  Psychiatric: She has a normal mood and affect. Her behavior is normal. Thought content normal.  Nursing note and vitals reviewed.   BP 114/78 (BP Location: Right Arm, Patient Position: Sitting, Cuff Size: Normal)   Pulse 87   Temp 98.2 F (36.8 C) (Oral)   Ht 5' (1.524 m)   Wt 111 lb (50.3 kg)   SpO2 97%   BMI 21.68 kg/m   Assessment and Plan: 1. Bronchitis, acute, with bronchospasm Continue nebulizer albuterol and mucinex - azithromycin (ZITHROMAX Z-PAK) 250 MG tablet; UAD  Dispense: 6 each; Refill: 0 - guaiFENesin-codeine (ROBITUSSIN AC) 100-10 MG/5ML syrup; Take 5 mLs by mouth 3 (three) times daily as needed for cough.  Dispense: 150 mL; Refill: 0   Partially dictated using Editor, commissioning. Any errors are unintentional.  Halina Maidens, MD [facility]  03/13/2018

## 2018-03-13 NOTE — Patient Instructions (Signed)
Use nebulizer twice a day for a week

## 2018-03-31 ENCOUNTER — Other Ambulatory Visit: Payer: Self-pay

## 2018-03-31 MED ORDER — ATORVASTATIN CALCIUM 40 MG PO TABS: 40.0000 mg | ORAL_TABLET | Freq: Every day | ORAL | 0 refills | Status: DC

## 2018-04-09 ENCOUNTER — Other Ambulatory Visit: Payer: Self-pay | Admitting: Oncology

## 2018-04-11 ENCOUNTER — Other Ambulatory Visit: Payer: Self-pay | Admitting: Internal Medicine

## 2018-04-11 DIAGNOSIS — F321 Major depressive disorder, single episode, moderate: Secondary | ICD-10-CM

## 2018-04-19 ENCOUNTER — Inpatient Hospital Stay: Payer: Medicare Other

## 2018-04-20 ENCOUNTER — Inpatient Hospital Stay: Payer: Medicare Other | Attending: Oncology | Admitting: Oncology

## 2018-04-20 ENCOUNTER — Other Ambulatory Visit: Payer: Self-pay

## 2018-04-20 ENCOUNTER — Inpatient Hospital Stay: Payer: Medicare Other

## 2018-04-20 ENCOUNTER — Encounter: Payer: Self-pay | Admitting: Oncology

## 2018-04-20 VITALS — BP 155/74 | HR 73 | Temp 96.2°F | Resp 18 | Wt 110.2 lb

## 2018-04-20 DIAGNOSIS — D696 Thrombocytopenia, unspecified: Secondary | ICD-10-CM | POA: Insufficient documentation

## 2018-04-20 DIAGNOSIS — K219 Gastro-esophageal reflux disease without esophagitis: Secondary | ICD-10-CM | POA: Diagnosis not present

## 2018-04-20 DIAGNOSIS — D500 Iron deficiency anemia secondary to blood loss (chronic): Secondary | ICD-10-CM

## 2018-04-20 DIAGNOSIS — D509 Iron deficiency anemia, unspecified: Secondary | ICD-10-CM | POA: Insufficient documentation

## 2018-04-20 DIAGNOSIS — R5382 Chronic fatigue, unspecified: Secondary | ICD-10-CM | POA: Insufficient documentation

## 2018-04-20 LAB — CBC WITH DIFFERENTIAL/PLATELET
Abs Immature Granulocytes: 0.01 10*3/uL (ref 0.00–0.07)
BASOS ABS: 0 10*3/uL (ref 0.0–0.1)
BASOS PCT: 0 %
Eosinophils Absolute: 0.1 10*3/uL (ref 0.0–0.5)
Eosinophils Relative: 2 %
HEMATOCRIT: 34.1 % — AB (ref 36.0–46.0)
HEMOGLOBIN: 11.3 g/dL — AB (ref 12.0–15.0)
Immature Granulocytes: 0 %
LYMPHS ABS: 0.7 10*3/uL (ref 0.7–4.0)
Lymphocytes Relative: 20 %
MCH: 32.4 pg (ref 26.0–34.0)
MCHC: 33.1 g/dL (ref 30.0–36.0)
MCV: 97.7 fL (ref 80.0–100.0)
Monocytes Absolute: 0.3 10*3/uL (ref 0.1–1.0)
Monocytes Relative: 9 %
NEUTROS ABS: 2.4 10*3/uL (ref 1.7–7.7)
NRBC: 0 % (ref 0.0–0.2)
Neutrophils Relative %: 69 %
PLATELETS: 139 10*3/uL — AB (ref 150–400)
RBC: 3.49 MIL/uL — ABNORMAL LOW (ref 3.87–5.11)
RDW: 13.2 % (ref 11.5–15.5)
WBC: 3.5 10*3/uL — AB (ref 4.0–10.5)

## 2018-04-20 LAB — IRON AND TIBC
IRON: 93 ug/dL (ref 28–170)
SATURATION RATIOS: 26 % (ref 10.4–31.8)
TIBC: 354 ug/dL (ref 250–450)
UIBC: 261 ug/dL

## 2018-04-20 LAB — FERRITIN: Ferritin: 44 ng/mL (ref 11–307)

## 2018-04-20 NOTE — Progress Notes (Signed)
Patient here for follow up. States being depressed due to health issues.

## 2018-04-20 NOTE — Progress Notes (Signed)
Hematology/Oncology Follow up  note [facility] [phone] [phone]   Patient Care Team: Glean Hess, MD as PCP - General (Internal Medicine) Lucilla Lame, MD as Consulting Physician (Gastroenterology) Earlie Server, MD as Consulting Physician (Oncology)  REFERRING PROVIDER: Glean Hess, MD REASON FOR VISIT Follow up for treatment of iron deficiency anemia.   HISTORY OF PRESENTING ILLNESS:  Vanessa Farmer is a  82 y.o.  female with PMH listed below who was referred to me for evaluation of pancytopenia.  Patient recently had lab work done which revealed pancytopenia with platelet counts 146,000, Hemoglobin 10.9, WBC 2.3, ANC 1.1 Patient reports  fatigue, denies weight loss, easy bruising, hematochezia, hemoptysis History of DVT: Denies  # GERD, patient was referred to GI and was evaluated. She had upper and lower endoscopy on 12/05/2017. She had esophagitis, was started on PPI. Benign sigmoid colon polyps and negative for celiac disease.  INTERVAL HISTORY Vanessa Farmer is a 82 y.o. female who has above history reviewed by me today presents for follow up visit of iron deficiency anemia.   Patient reports feeling well at baseline.  Chronic fatigue is stable, not worse.  She feels depressed because of her Scoliosis.  She has been taking sertraline 50 mg with some improvement.  Follows up with primary care physician.  Denies any suicidal ideation. Appetite is good.  Weight is stable.    Review of Systems  Constitutional: Positive for malaise/fatigue. Negative for chills, fever and weight loss.  HENT: Negative for sore throat.   Eyes: Negative for redness.  Respiratory: Negative for cough, shortness of breath and wheezing.   Cardiovascular: Negative for chest pain, palpitations and leg swelling.  Gastrointestinal: Negative for abdominal pain, blood in stool, nausea and vomiting.  Genitourinary: Negative for dysuria.    Musculoskeletal: Negative for myalgias.  Skin: Negative for rash.  Neurological: Negative for dizziness, tingling and tremors.  Endo/Heme/Allergies: Does not bruise/bleed easily.  Psychiatric/Behavioral: Positive for depression. Negative for hallucinations.    MEDICAL HISTORY:  Past Medical History:  Diagnosis Date  . Anemia   . Cataract   . Fall   . Hypertension   . Myocardial infarction ([place])    "mild" many yrs ago    SURGICAL HISTORY: Past Surgical History:  Procedure Laterality Date  . ABDOMINAL HYSTERECTOMY    . COLONOSCOPY WITH PROPOFOL N/A 12/05/2017   Procedure: COLONOSCOPY WITH PROPOFOL;  Surgeon: Lucilla Lame, MD;  Location: [place];  Service: Endoscopy;  Laterality: N/A;  . ESOPHAGOGASTRODUODENOSCOPY (EGD) WITH PROPOFOL N/A 12/05/2017   Procedure: ESOPHAGOGASTRODUODENOSCOPY (EGD) WITH PROPOFOL;  Surgeon: Lucilla Lame, MD;  Location: [place] [facility];  Service: Endoscopy;  Laterality: N/A;  . POLYPECTOMY  12/05/2017   Procedure: POLYPECTOMY;  Surgeon: Lucilla Lame, MD;  Location: [place];  Service: Endoscopy;;  . TONSILLECTOMY      SOCIAL HISTORY: Social History   Socioeconomic History  . Marital status: Widowed    Spouse name: Not on file  . Number of children: 4  . Years of education: Not on file  . Highest education level: 9th grade  Occupational History  . Occupation: Retired  Scientific laboratory technician  . Financial resource strain: Not hard at all  . Food insecurity:    Worry: Never true    Inability: Never true  . Transportation needs:    Medical: No    Non-medical: No  Tobacco Use  . Smoking status: Never Smoker  . Smokeless tobacco: Never Used  . Tobacco  comment: smoking cessation materials not required  Substance and Sexual Activity  . Alcohol use: No  . Drug use: No  . Sexual activity: Not Currently  Lifestyle  . Physical activity:    Days per week: 0 days    Minutes per session: 0 min  . Stress: Not at all  Relationships   . Social connections:    Talks on phone: Patient refused    Gets together: Patient refused    Attends religious service: Patient refused    Active member of club or organization: Patient refused    Attends meetings of clubs or organizations: Patient refused    Relationship status: Widowed  . Intimate partner violence:    Fear of current or ex partner: No    Emotionally abused: No    Physically abused: No    Forced sexual activity: No  Other Topics Concern  . Not on file  Social History Narrative  . Not on file    FAMILY HISTORY: Family History  Problem Relation Age of Onset  . Heart disease Mother   . Brain cancer Mother   . Heart attack Father   . Liver disease Daughter   . Throat cancer Brother   . Cancer Maternal Grandfather        Unsure type    ALLERGIES:  has No Known Allergies.  MEDICATIONS:  Current Outpatient Medications  Medication Sig Dispense Refill  . acetaminophen (TYLENOL) 500 MG tablet Take 2 tablets (1,000 mg total) by mouth 2 (two) times daily. 30 tablet   . amLODipine (NORVASC) 10 MG tablet Take 1 tablet (10 mg total) by mouth daily. 30 tablet 5  . aspirin 325 MG tablet Take 325 mg by mouth daily.    Marland Kitchen atorvastatin (LIPITOR) 40 MG tablet Take 1 tablet (40 mg total) by mouth daily. 90 tablet 0  . ENSURE (ENSURE) Take 237 mLs by mouth 2 (two) times daily between meals. 237 mL 12  . FEROSUL 325 (65 Fe) MG tablet TAKE 1 TABLET BY MOUTH TWICE DAILY WITH A MEAL 60 tablet 0  . guaiFENesin-codeine (ROBITUSSIN AC) 100-10 MG/5ML syrup Take 5 mLs by mouth 3 (three) times daily as needed for cough. 150 mL 0  . lisinopril-hydrochlorothiazide (PRINZIDE,ZESTORETIC) 20-12.5 MG tablet Take 2 tablets by mouth daily. 60 tablet 5  . Multiple Vitamins-Calcium (ONE-A-DAY WOMENS PO) Take 1 tablet by mouth daily.    Marland Kitchen nystatin (MYCOSTATIN/NYSTOP) powder Apply topically 2 (two) times daily. 45 g 0  . pantoprazole (PROTONIX) 40 MG tablet TAKE 1 TABLET(40 MG) BY MOUTH DAILY 90  tablet 2  . polyethylene glycol (GOLYTELY) 236 g solution Drink one 8 oz glass every 20 mins until entire container is finished. 4000 mL 0  . sertraline (ZOLOFT) 50 MG tablet Take 1 tablet (50 mg total) by mouth daily. 30 tablet 5  . vitamin C (ASCORBIC ACID) 500 MG tablet Take 1 tablet (500 mg total) by mouth daily. 30 tablet 2   No current facility-administered medications for this visit.      PHYSICAL EXAMINATION: ECOG PERFORMANCE STATUS: 1 - Symptomatic but completely ambulatory Vitals:   04/20/18 1054  BP: (!) 155/74  Pulse: 73  Resp: 18  Temp: (!) 96.2 F (35.7 C)   Filed Weights   04/20/18 1054  Weight: 110 lb 3.7 oz (50 kg)    Physical Exam Constitutional:      General: She is not in acute distress.    Appearance: She is well-developed.  HENT:  Head: Normocephalic and atraumatic.     Right Ear: External ear normal.     Left Ear: External ear normal.  Eyes:     General: No scleral icterus.    Conjunctiva/sclera: Conjunctivae normal.     Pupils: Pupils are equal, round, and reactive to light.  Neck:     Musculoskeletal: Normal range of motion and neck supple.  Cardiovascular:     Rate and Rhythm: Normal rate and regular rhythm.     Heart sounds: Normal heart sounds.  Pulmonary:     Effort: Pulmonary effort is normal. No respiratory distress.     Breath sounds: Normal breath sounds. No wheezing or rales.  Chest:     Chest wall: No tenderness.  Abdominal:     General: Bowel sounds are normal. There is no distension.     Palpations: Abdomen is soft. There is no mass.     Tenderness: There is no abdominal tenderness.  Musculoskeletal: Normal range of motion.        General: No deformity.     Comments: scoliosis  Lymphadenopathy:     Cervical: No cervical adenopathy.  Skin:    General: Skin is warm and dry.     Findings: No erythema or rash.  Neurological:     Mental Status: She is alert and oriented to person, place, and time.     Cranial Nerves: No  cranial nerve deficit.     Coordination: Coordination normal.  Psychiatric:        Behavior: Behavior normal.        Thought Content: Thought content normal.      LABORATORY DATA:  I have reviewed the data as listed Lab Results  Component Value Date   WBC 3.5 (L) 04/20/2018   HGB 11.3 (L) 04/20/2018   HCT 34.1 (L) 04/20/2018   MCV 97.7 04/20/2018   PLT 139 (L) 04/20/2018   Recent Labs    06/10/17 1153 09/29/17 1122  NA 138 138  K 3.9 3.7  CL 96 100*  CO2 27 28  GLUCOSE 96 96  BUN 18 29*  CREATININE 1.12* 1.34*  CALCIUM 9.3 8.7*  GFRNONAA 47* 36*  GFRAA 54* 42*  PROT  --  7.3  ALBUMIN  --  4.1  AST  --  26  ALT  --  22  ALKPHOS  --  66  BILITOT  --  0.6   Normal vitamin B12 and folate level.  Negative flow cytometry.    ASSESSMENT & PLAN:  1. Iron deficiency anemia due to chronic blood loss   2. Thrombocytopenia ([place])    #Iron deficiency anemia,  Labs reviewed and discussed with patient. Hemoglobin stable, slightly increased.  11.3.  Hold additional IV iron for now. Iron panel is pending.  Advised patient to continue take her oral iron supplements 325 mg twice daily.  She can also take with vitamin C as well.  #Thrombocytopenia, chronic, intermittently she has mild thrombocytopenia.  Previously work-up has been done.  Normal vitamin B12 and folate level.  Negative flow cytometry. Continue to monitor.  No actively bleeding.  All questions were answered. The patient knows to call the clinic with any problems questions or concerns. Orders Placed This Encounter  Procedures  . CBC with Differential/Platelet    Standing Status:   Future    Standing Expiration Date:   04/21/2019  . Iron and TIBC    Standing Status:   Future    Standing Expiration Date:   04/21/2019  .  Ferritin    Standing Status:   Future    Standing Expiration Date:   04/21/2019   Return of visit: 6 months.  Total face to face encounter time for this patient visit was 15 min. >50% of the  time was  spent in counseling and coordination of care.    Earlie Server, MD, PhD Hematology Oncology [facility] at [facility] Pager- [phone]

## 2018-05-01 ENCOUNTER — Telehealth: Payer: Self-pay

## 2018-05-01 NOTE — Telephone Encounter (Signed)
Iva Lento from [facility] called requesting a written Rx to be faxed to her office for patient to receive:  Bedside Commode Pull ups Pull up [place]   CB# [phone] Fax: [phone]  Please Advise.

## 2018-05-07 ENCOUNTER — Other Ambulatory Visit: Payer: Self-pay | Admitting: Oncology

## 2018-05-11 ENCOUNTER — Ambulatory Visit: Payer: Medicare Other | Admitting: Internal Medicine

## 2018-05-13 ENCOUNTER — Other Ambulatory Visit: Payer: Self-pay | Admitting: Internal Medicine

## 2018-05-13 DIAGNOSIS — I10 Essential (primary) hypertension: Secondary | ICD-10-CM

## 2018-06-02 ENCOUNTER — Other Ambulatory Visit: Payer: Self-pay | Admitting: Internal Medicine

## 2018-06-02 DIAGNOSIS — I10 Essential (primary) hypertension: Secondary | ICD-10-CM

## 2018-06-05 ENCOUNTER — Ambulatory Visit: Payer: Medicare Other

## 2018-06-11 ENCOUNTER — Other Ambulatory Visit: Payer: Self-pay | Admitting: Internal Medicine

## 2018-06-12 ENCOUNTER — Ambulatory Visit: Payer: Medicare Other

## 2018-06-19 ENCOUNTER — Ambulatory Visit: Payer: Medicare Other

## 2018-06-23 ENCOUNTER — Other Ambulatory Visit: Payer: Self-pay | Admitting: Internal Medicine

## 2018-06-23 ENCOUNTER — Other Ambulatory Visit: Payer: Self-pay | Admitting: Oncology

## 2018-06-23 DIAGNOSIS — I10 Essential (primary) hypertension: Secondary | ICD-10-CM

## 2018-07-03 ENCOUNTER — Ambulatory Visit: Payer: Medicare Other

## 2018-07-06 ENCOUNTER — Telehealth: Payer: Self-pay | Admitting: Internal Medicine

## 2018-07-06 NOTE — Telephone Encounter (Signed)
Rx for pull ups, liners,and nutritional supplements.

## 2018-07-06 NOTE — Telephone Encounter (Signed)
Please advise above message. Need rx faxed to [facility] for patient supplies.

## 2018-07-07 ENCOUNTER — Other Ambulatory Visit: Payer: Self-pay | Admitting: Internal Medicine

## 2018-07-07 DIAGNOSIS — E46 Unspecified protein-calorie malnutrition: Secondary | ICD-10-CM

## 2018-07-07 DIAGNOSIS — N39498 Other specified urinary incontinence: Secondary | ICD-10-CM

## 2018-07-19 ENCOUNTER — Ambulatory Visit (INDEPENDENT_AMBULATORY_CARE_PROVIDER_SITE_OTHER): Payer: Medicare Other | Admitting: Internal Medicine

## 2018-07-19 ENCOUNTER — Ambulatory Visit (INDEPENDENT_AMBULATORY_CARE_PROVIDER_SITE_OTHER): Payer: Medicare Other

## 2018-07-19 ENCOUNTER — Encounter: Payer: Self-pay | Admitting: Internal Medicine

## 2018-07-19 ENCOUNTER — Other Ambulatory Visit: Payer: Self-pay

## 2018-07-19 ENCOUNTER — Other Ambulatory Visit
Admission: RE | Admit: 2018-07-19 | Discharge: 2018-07-19 | Disposition: A | Discharge status: Home / Self Care | Payer: Medicare Other | Attending: Internal Medicine | Admitting: Internal Medicine

## 2018-07-19 VITALS — BP 132/64 | HR 78 | Resp 17 | Ht 60.0 in | Wt 110.8 lb

## 2018-07-19 VITALS — BP 112/72 | HR 78 | Ht 60.0 in | Wt 110.0 lb

## 2018-07-19 DIAGNOSIS — M419 Scoliosis, unspecified: Secondary | ICD-10-CM | POA: Diagnosis not present

## 2018-07-19 DIAGNOSIS — N183 Chronic kidney disease, stage 3 unspecified: Secondary | ICD-10-CM

## 2018-07-19 DIAGNOSIS — I10 Essential (primary) hypertension: Secondary | ICD-10-CM | POA: Insufficient documentation

## 2018-07-19 DIAGNOSIS — K219 Gastro-esophageal reflux disease without esophagitis: Secondary | ICD-10-CM | POA: Insufficient documentation

## 2018-07-19 DIAGNOSIS — F321 Major depressive disorder, single episode, moderate: Secondary | ICD-10-CM

## 2018-07-19 DIAGNOSIS — D61818 Other pancytopenia: Secondary | ICD-10-CM | POA: Insufficient documentation

## 2018-07-19 DIAGNOSIS — Z1231 Encounter for screening mammogram for malignant neoplasm of breast: Secondary | ICD-10-CM

## 2018-07-19 DIAGNOSIS — Z0000 Encounter for general adult medical examination without abnormal findings: Secondary | ICD-10-CM | POA: Diagnosis not present

## 2018-07-19 DIAGNOSIS — Z780 Asymptomatic menopausal state: Secondary | ICD-10-CM | POA: Diagnosis not present

## 2018-07-19 DIAGNOSIS — E785 Hyperlipidemia, unspecified: Secondary | ICD-10-CM

## 2018-07-19 DIAGNOSIS — D696 Thrombocytopenia, unspecified: Secondary | ICD-10-CM | POA: Insufficient documentation

## 2018-07-19 LAB — CBC WITH DIFFERENTIAL/PLATELET
Abs Immature Granulocytes: 0.02 10*3/uL (ref 0.00–0.07)
BASOS PCT: 0 %
Basophils Absolute: 0 10*3/uL (ref 0.0–0.1)
Eosinophils Absolute: 0.1 10*3/uL (ref 0.0–0.5)
Eosinophils Relative: 1 %
HCT: 35.6 % — ABNORMAL LOW (ref 36.0–46.0)
Hemoglobin: 11.9 g/dL — ABNORMAL LOW (ref 12.0–15.0)
Immature Granulocytes: 1 %
Lymphocytes Relative: 18 %
Lymphs Abs: 0.8 10*3/uL (ref 0.7–4.0)
MCH: 33.4 pg (ref 26.0–34.0)
MCHC: 33.4 g/dL (ref 30.0–36.0)
MCV: 100 fL (ref 80.0–100.0)
Monocytes Absolute: 0.3 10*3/uL (ref 0.1–1.0)
Monocytes Relative: 8 %
Neutro Abs: 3 10*3/uL (ref 1.7–7.7)
Neutrophils Relative %: 72 %
Platelets: 138 10*3/uL — ABNORMAL LOW (ref 150–400)
RBC: 3.56 MIL/uL — ABNORMAL LOW (ref 3.87–5.11)
RDW: 13.2 % (ref 11.5–15.5)
WBC: 4.2 10*3/uL (ref 4.0–10.5)
nRBC: 0 % (ref 0.0–0.2)

## 2018-07-19 LAB — COMPREHENSIVE METABOLIC PANEL
ALT: 26 U/L (ref 0–44)
AST: 27 U/L (ref 15–41)
Albumin: 4.2 g/dL (ref 3.5–5.0)
Alkaline Phosphatase: 65 U/L (ref 38–126)
Anion gap: 11 (ref 5–15)
BUN: 19 mg/dL (ref 8–23)
CHLORIDE: 98 mmol/L (ref 98–111)
CO2: 28 mmol/L (ref 22–32)
CREATININE: 1.28 mg/dL — AB (ref 0.44–1.00)
Calcium: 9.4 mg/dL (ref 8.9–10.3)
GFR calc Af Amer: 46 mL/min — ABNORMAL LOW (ref >60)
GFR calc non Af Amer: 39 mL/min — ABNORMAL LOW (ref >60)
Glucose, Bld: 114 mg/dL — ABNORMAL HIGH (ref 70–99)
Potassium: 3.8 mmol/L (ref 3.5–5.1)
Sodium: 137 mmol/L (ref 135–145)
Total Bilirubin: 0.9 mg/dL (ref 0.3–1.2)
Total Protein: 7.2 g/dL (ref 6.5–8.1)

## 2018-07-19 LAB — TSH: TSH: 1.259 u[IU]/mL (ref 0.350–4.500)

## 2018-07-19 LAB — POCT URINALYSIS DIPSTICK
BILIRUBIN UA: NEGATIVE
Glucose, UA: NEGATIVE
KETONES UA: NEGATIVE
Nitrite, UA: NEGATIVE
PH UA: 6 (ref 5.0–8.0)
Protein, UA: NEGATIVE
RBC UA: NEGATIVE
SPEC GRAV UA: 1.015 (ref 1.010–1.025)
UROBILINOGEN UA: 0.2 U/dL

## 2018-07-19 LAB — LIPID PANEL
Cholesterol: 134 mg/dL (ref 0–200)
HDL: 59 mg/dL (ref >40)
LDL Cholesterol: 59 mg/dL (ref 0–99)
Total CHOL/HDL Ratio: 2.3 RATIO
Triglycerides: 81 mg/dL (ref <150)
VLDL: 16 mg/dL (ref 0–40)

## 2018-07-19 NOTE — Patient Instructions (Signed)
Vanessa Farmer , Thank you for taking time to come for your Medicare Wellness Visit. I appreciate your ongoing commitment to your health goals. Please review the following plan we discussed and let me know if I can assist you in the future.   Screening recommendations/referrals: Colonoscopy: done 12/05/17 Mammogram: due. Please call [phone] to schedule your mammogram and bone density screening.  Bone Density: done 2014. Recommended yearly ophthalmology/optometry visit for glaucoma screening and checkup Recommended yearly dental visit for hygiene and checkup  Vaccinations: Influenza vaccine: done 01/31/17 Pneumococcal vaccine: done 01/17/18 Tdap vaccine: done 06/07/16 Shingles vaccine: Shingrix discussed. Please contact your pharmacy for coverage information.   Advanced directives: Advance directive discussed with you today. I have provided a copy for you to complete at home and have notarized. Once this is complete please bring a copy in to our office so we can scan it into your chart.  Conditions/risks identified: Recommend increase water intake to 6-8 glasses of water per day.   Next appointment: Please follow up in one year for your Medicare Annual Wellness visit.    Preventive Care 80 Years and Older, Female Preventive care refers to lifestyle choices and visits with your health care provider that can promote health and wellness. What does preventive care include?  A yearly physical exam. This is also called an annual well check.  Dental exams once or twice a year.  Routine eye exams. Ask your health care provider how often you should have your eyes checked.  Personal lifestyle choices, including:  Daily care of your teeth and gums.  Regular physical activity.  Eating a healthy diet.  Avoiding tobacco and drug use.  Limiting alcohol use.  Practicing safe sex.  Taking low-dose aspirin every day.  Taking vitamin and mineral supplements as recommended by your health  care provider. What happens during an annual well check? The services and screenings done by your health care provider during your annual well check will depend on your age, overall health, lifestyle risk factors, and family history of disease. Counseling  Your health care provider may ask you questions about your:  Alcohol use.  Tobacco use.  Drug use.  Emotional well-being.  Home and relationship well-being.  Sexual activity.  Eating habits.  History of falls.  Memory and ability to understand (cognition).  Work and work Statistician.  Reproductive health. Screening  You may have the following tests or measurements:  Height, weight, and BMI.  Blood pressure.  Lipid and cholesterol levels. These may be checked every 5 years, or more frequently if you are over 15 years old.  Skin check.  Lung cancer screening. You may have this screening every year starting at age 41 if you have a 30-pack-year history of smoking and currently smoke or have quit within the past 15 years.  Fecal occult blood test (FOBT) of the stool. You may have this test every year starting at age 32.  Flexible sigmoidoscopy or colonoscopy. You may have a sigmoidoscopy every 5 years or a colonoscopy every 10 years starting at age 46.  Hepatitis C blood test.  Hepatitis B blood test.  Sexually transmitted disease (STD) testing.  Diabetes screening. This is done by checking your blood sugar (glucose) after you have not eaten for a while (fasting). You may have this done every 1-3 years.  Bone density scan. This is done to screen for osteoporosis. You may have this done starting at age 39.  Mammogram. This may be done every 1-2 years. Talk to your  health care provider about how often you should have regular mammograms. Talk with your health care provider about your test results, treatment options, and if necessary, the need for more tests. Vaccines  Your health care provider may recommend certain  vaccines, such as:  Influenza vaccine. This is recommended every year.  Tetanus, diphtheria, and acellular pertussis (Tdap, Td) vaccine. You may need a Td booster every 10 years.  Zoster vaccine. You may need this after age 64.  Pneumococcal 13-valent conjugate (PCV13) vaccine. One dose is recommended after age 9.  Pneumococcal polysaccharide (PPSV23) vaccine. One dose is recommended after age 60. Talk to your health care provider about which screenings and vaccines you need and how often you need them. This information is not intended to replace advice given to you by your health care provider. Make sure you discuss any questions you have with your health care provider. Document Released: 05/23/2015 Document Revised: 01/14/2016 Document Reviewed: 02/25/2015 Elsevier Interactive Patient Education  2017 [place] Prevention in the Home Falls can cause injuries. They can happen to people of all ages. There are many things you can do to make your home safe and to help prevent falls. What can I do on the outside of my home?  Regularly fix the edges of walkways and driveways and fix any cracks.  Remove anything that might make you trip as you walk through a door, such as a raised step or threshold.  Trim any bushes or trees on the path to your home.  Use bright outdoor lighting.  Clear any walking paths of anything that might make someone trip, such as rocks or tools.  Regularly check to see if handrails are loose or broken. Make sure that both sides of any steps have handrails.  Any raised decks and porches should have guardrails on the edges.  Have any leaves, snow, or ice cleared regularly.  Use sand or salt on walking paths during winter.  Clean up any spills in your garage right away. This includes oil or grease spills. What can I do in the bathroom?  Use night lights.  Install grab bars by the toilet and in the tub and shower. Do not use towel bars as grab  bars.  Use non-skid mats or decals in the tub or shower.  If you need to sit down in the shower, use a plastic, non-slip stool.  Keep the floor dry. Clean up any water that spills on the floor as soon as it happens.  Remove soap buildup in the tub or shower regularly.  Attach bath mats securely with double-sided non-slip rug tape.  Do not have throw rugs and other things on the floor that can make you trip. What can I do in the bedroom?  Use night lights.  Make sure that you have a light by your bed that is easy to reach.  Do not use any sheets or blankets that are too big for your bed. They should not hang down onto the floor.  Have a firm chair that has side arms. You can use this for support while you get dressed.  Do not have throw rugs and other things on the floor that can make you trip. What can I do in the kitchen?  Clean up any spills right away.  Avoid walking on wet floors.  Keep items that you use a lot in easy-to-reach places.  If you need to reach something above you, use a strong step stool that has a  grab bar.  Keep electrical cords out of the way.  Do not use floor polish or wax that makes floors slippery. If you must use wax, use non-skid floor wax.  Do not have throw rugs and other things on the floor that can make you trip. What can I do with my stairs?  Do not leave any items on the stairs.  Make sure that there are handrails on both sides of the stairs and use them. Fix handrails that are broken or loose. Make sure that handrails are as long as the stairways.  Check any carpeting to make sure that it is firmly attached to the stairs. Fix any carpet that is loose or worn.  Avoid having throw rugs at the top or bottom of the stairs. If you do have throw rugs, attach them to the floor with carpet tape.  Make sure that you have a light switch at the top of the stairs and the bottom of the stairs. If you do not have them, ask someone to add them for  you. What else can I do to help prevent falls?  Wear shoes that:  Do not have high heels.  Have rubber bottoms.  Are comfortable and fit you well.  Are closed at the toe. Do not wear sandals.  If you use a stepladder:  Make sure that it is fully opened. Do not climb a closed stepladder.  Make sure that both sides of the stepladder are locked into place.  Ask someone to hold it for you, if possible.  Clearly mark and make sure that you can see:  Any grab bars or handrails.  First and last steps.  Where the edge of each step is.  Use tools that help you move around (mobility aids) if they are needed. These include:  Canes.  Walkers.  Scooters.  Crutches.  Turn on the lights when you go into a dark area. Replace any light bulbs as soon as they burn out.  Set up your furniture so you have a clear path. Avoid moving your furniture around.  If any of your floors are uneven, fix them.  If there are any pets around you, be aware of where they are.  Review your medicines with your doctor. Some medicines can make you feel dizzy. This can increase your chance of falling. Ask your doctor what other things that you can do to help prevent falls. This information is not intended to replace advice given to you by your health care provider. Make sure you discuss any questions you have with your health care provider. Document Released: 02/20/2009 Document Revised: 10/02/2015 Document Reviewed: 05/31/2014 Elsevier Interactive Patient Education  2017 [employer].

## 2018-07-19 NOTE — Progress Notes (Signed)
Date:  07/19/2018   Name:  Vanessa KEEVEN   DOB:  [birth year]   MRN:  [id number]   Chief Complaint: Annual Exam (Breast Exam.) Vanessa Farmer is a 83 y.o. female who presents today for her annual check up. She feels fairly well just short of breath. She reports exercising none. She reports she is sleeping fairly well. She denies breast issues.  Hypertension  Associated symptoms include headaches and shortness of breath (from scoliosis). Pertinent negatives include no chest pain or palpitations.  Gastroesophageal Reflux  She reports no abdominal pain, no chest pain, no coughing or no wheezing. Pertinent negatives include no fatigue.  Hyperlipidemia  Associated symptoms include shortness of breath (from scoliosis). Pertinent negatives include no chest pain.  Depression         Associated symptoms include headaches.  Associated symptoms include no fatigue. Back Pain  This is a chronic problem. The problem occurs constantly. The problem is unchanged. The quality of the pain is described as aching. The pain is moderate. The symptoms are aggravated by bending, lying down and twisting. Associated symptoms include headaches. Pertinent negatives include no abdominal pain, chest pain, dysuria or fever.   Lab Results  Component Value Date   WBC 4.2 07/19/2018   HGB 11.9 (L) 07/19/2018   HCT 35.6 (L) 07/19/2018   MCV 100.0 07/19/2018   PLT 138 (L) 07/19/2018   Lab Results  Component Value Date   CREATININE 1.28 (H) 07/19/2018   BUN 19 07/19/2018   NA 137 07/19/2018   K 3.8 07/19/2018   CL 98 07/19/2018   CO2 28 07/19/2018   Lab Results  Component Value Date   CHOL 135 01/17/2018   HDL 53 01/17/2018   LDLCALC 64 01/17/2018   TRIG 90 01/17/2018   CHOLHDL 2.5 01/17/2018     Review of Systems  Constitutional: Negative for chills, fatigue and fever.  HENT: Negative for congestion, hearing loss, tinnitus, trouble swallowing and voice change.   Eyes: Positive for visual disturbance  (cataracts).  Respiratory: Positive for shortness of breath (from scoliosis). Negative for cough, chest tightness and wheezing.   Cardiovascular: Negative for chest pain, palpitations and leg swelling.  Gastrointestinal: Negative for abdominal pain, constipation, diarrhea and vomiting.  Endocrine: Negative for polydipsia and polyuria.  Genitourinary: Negative for dysuria, frequency, genital sores, vaginal bleeding and vaginal discharge.  Musculoskeletal: Positive for back pain. Negative for arthralgias, gait problem and joint swelling.  Skin: Negative for color change and rash.  Neurological: Positive for headaches. Negative for dizziness, tremors and light-headedness.  Hematological: Negative for adenopathy. Does not bruise/bleed easily.  Psychiatric/Behavioral: Positive for depression. Negative for dysphoric mood and sleep disturbance. The patient is not nervous/anxious.     Patient Active Problem List   Diagnosis Date Noted  . Thrombocytopenia ([place]) 07/19/2018  . Iron deficiency anemia   . Polyp of sigmoid colon   . Pancytopenia ([place]) 09/30/2017  . GERD (gastroesophageal reflux disease) 06/10/2017  . CKD (chronic kidney disease) stage 3, GFR 30-59 ml/min (HCC) 06/08/2016  . Hyperlipidemia 06/08/2016  . Scoliosis 06/07/2016  . Osteoporosis 06/07/2016  . Essential hypertension 06/07/2016  . CAD (coronary artery disease) 06/07/2016  . Depression, major, single episode, moderate ([place]) 06/07/2016    No Known Allergies  Past Surgical History:  Procedure Laterality Date  . ABDOMINAL HYSTERECTOMY    . COLONOSCOPY WITH PROPOFOL N/A 12/05/2017   Procedure: COLONOSCOPY WITH PROPOFOL;  Surgeon: Lucilla Lame, MD;  Location: [place];  Service: Endoscopy;  Laterality: N/A;  . ESOPHAGOGASTRODUODENOSCOPY (EGD) WITH PROPOFOL N/A 12/05/2017   Procedure: ESOPHAGOGASTRODUODENOSCOPY (EGD) WITH PROPOFOL;  Surgeon: Lucilla Lame, MD;  Location: [place];  Service: Endoscopy;   Laterality: N/A;  . POLYPECTOMY  12/05/2017   Procedure: POLYPECTOMY;  Surgeon: Lucilla Lame, MD;  Location: [place];  Service: Endoscopy;;  . TONSILLECTOMY      Social History   Tobacco Use  . Smoking status: Never Smoker  . Smokeless tobacco: Never Used  . Tobacco comment: smoking cessation materials not required  Substance Use Topics  . Alcohol use: No  . Drug use: No     Medication list has been reviewed and updated.  Current Meds  Medication Sig  . acetaminophen (TYLENOL) 500 MG tablet Take 2 tablets (1,000 mg total) by mouth 2 (two) times daily.  Marland Kitchen amLODipine (NORVASC) 10 MG tablet TAKE 1 TABLET BY MOUTH DAILY  . aspirin 325 MG tablet Take 325 mg by mouth daily.  Marland Kitchen atorvastatin (LIPITOR) 40 MG tablet TAKE 1 TABLET(40 MG) BY MOUTH DAILY  . cholecalciferol (VITAMIN D3) 25 MCG (1000 UT) tablet Take 1,000 Units by mouth daily.  Marland Kitchen ENSURE (ENSURE) Take 237 mLs by mouth 2 (two) times daily between meals.  . FEROSUL 325 (65 Fe) MG tablet TAKE 1 TABLET BY MOUTH TWICE DAILY WITH A MEAL  . lisinopril-hydrochlorothiazide (PRINZIDE,ZESTORETIC) 20-12.5 MG tablet TAKE 2 TABLETS BY MOUTH DAILY  . Multiple Vitamins-Calcium (ONE-A-DAY WOMENS PO) Take 1 tablet by mouth daily.  Marland Kitchen nystatin (MYCOSTATIN/NYSTOP) powder Apply topically 2 (two) times daily.  . pantoprazole (PROTONIX) 40 MG tablet TAKE 1 TABLET(40 MG) BY MOUTH DAILY  . sertraline (ZOLOFT) 50 MG tablet Take 1 tablet (50 mg total) by mouth daily.  . vitamin C (ASCORBIC ACID) 500 MG tablet Take 1 tablet (500 mg total) by mouth daily.  . [DISCONTINUED] guaiFENesin-codeine (ROBITUSSIN AC) 100-10 MG/5ML syrup Take 5 mLs by mouth 3 (three) times daily as needed for cough.  . [DISCONTINUED] polyethylene glycol (GOLYTELY) 236 g solution Drink one 8 oz glass every 20 mins until entire container is finished.    PHQ 2/9 Scores 07/19/2018 01/17/2018 10/24/2017 09/29/2017  PHQ - 2 Score 2 1 0 5  PHQ- 9 Score 6 - 2 9    Physical Exam  Vitals signs and nursing note reviewed.  Constitutional:      General: She is not in acute distress.    Appearance: She is well-developed.  HENT:     Head: Normocephalic and atraumatic.     Right Ear: Tympanic membrane and ear canal normal.     Left Ear: Tympanic membrane and ear canal normal.     Nose:     Right Sinus: No maxillary sinus tenderness.     Left Sinus: No maxillary sinus tenderness.     Mouth/Throat:     Pharynx: Uvula midline.  Eyes:     General: No scleral icterus.       Right eye: No discharge.        Left eye: No discharge.     Conjunctiva/sclera: Conjunctivae normal.  Neck:     Musculoskeletal: Normal range of motion. No erythema.     Thyroid: No thyromegaly.     Vascular: No carotid bruit.  Cardiovascular:     Rate and Rhythm: Normal rate and regular rhythm.     Pulses: Normal pulses.     Heart sounds: Normal heart sounds.  Pulmonary:     Effort: Pulmonary effort is normal. No respiratory distress.  Breath sounds: No wheezing.  Chest:     Breasts:        Right: No mass, nipple discharge, skin change or tenderness.        Left: No mass, nipple discharge, skin change or tenderness.  Abdominal:     General: Bowel sounds are normal.     Palpations: Abdomen is soft.     Tenderness: There is no abdominal tenderness.  Musculoskeletal:        General: Deformity present.     Cervical back: She exhibits deformity (scoliosis).       Back:  Lymphadenopathy:     Cervical: No cervical adenopathy.  Skin:    General: Skin is warm and dry.     Findings: No rash.  Neurological:     Mental Status: She is alert and oriented to person, place, and time.     Cranial Nerves: No cranial nerve deficit.     Sensory: Sensation is intact. No sensory deficit.     Motor: Motor function is intact.     Gait: Gait abnormal (uses cane).     Deep Tendon Reflexes: Reflexes are normal and symmetric.  Psychiatric:        Attention and Perception: Attention normal.        Mood  and Affect: Mood normal.        Speech: Speech normal.        Behavior: Behavior normal.        Thought Content: Thought content normal.     Wt Readings from Last 3 Encounters:  07/19/18 110 lb (49.9 kg)  07/19/18 110 lb 12.8 oz (50.3 kg)  04/20/18 110 lb 3.7 oz (50 kg)    BP 112/72   Pulse 78   Ht 5' (1.524 m)   Wt 110 lb (49.9 kg)   SpO2 97%   BMI 21.48 kg/m   Assessment and Plan: 1. Essential hypertension controlled - POCT urinalysis dipstick - Comprehensive metabolic panel  2. Depression, major, single episode, moderate (HCC) Doing well on zoloft - TSH  3. Gastroesophageal reflux disease, esophagitis presence not specified Stable, symptoms controlled with daily PPI - CBC with Differential/Platelet  4. CKD (chronic kidney disease) stage 3, GFR 30-59 ml/min (HCC) Check labs  5. Pancytopenia ([place]) monitoring - CBC with Differential/Platelet  6. Hyperlipidemia, unspecified hyperlipidemia type On statin therapy - Lipid panel  7. Scoliosis of thoracolumbar spine, unspecified scoliosis type Chronic pain and gait disturbance - remain as active as possible.   Partially dictated using Editor, commissioning. Any errors are unintentional.  Halina Maidens, MD [facility]  07/19/2018

## 2018-07-19 NOTE — Progress Notes (Signed)
Subjective:   Vanessa Farmer is a 83 y.o. female who presents for Medicare Annual (Subsequent) preventive examination.  Review of Systems:   Cardiac Risk Factors include: dyslipidemia;hypertension;advanced age (>50men, >78 women)     Objective:     Vitals: BP 132/64 (BP Location: Right Arm, Patient Position: Sitting, Cuff Size: Normal)   Pulse 78   Resp 17   Ht 5' (1.524 m)   Wt 110 lb 12.8 oz (50.3 kg)   SpO2 94%   BMI 21.64 kg/m   Body mass index is 21.64 kg/m.  Advanced Directives 07/19/2018 04/20/2018 01/19/2018 12/05/2017 10/20/2017 10/06/2017 06/01/2017  Does Patient Have a Medical Advance Directive? No Yes Yes Yes No No No  Type of Advance Directive - - - [place]  Does patient want to make changes to medical advance directive? - - - No - Patient declined - - -  Copy of [place] in Chart? - - - No - copy requested - - -  Would patient like information on creating a medical advance directive? Yes (MAU/Ambulatory/Procedural Areas - Information given) - - - - No - Patient declined Yes (MAU/Ambulatory/Procedural Areas - Information given)    Tobacco Social History   Tobacco Use  Smoking Status Never Smoker  Smokeless Tobacco Never Used  Tobacco Comment   smoking cessation materials not required     Counseling given: Not Answered Comment: smoking cessation materials not required   Clinical Intake:  Pre-visit preparation completed: Yes  Pain : 0-10 Pain Score: 10-Worst pain ever Pain Type: Chronic pain Pain Location: Hip Pain Orientation: Left Pain Descriptors / Indicators: Aching Pain Onset: More than a month ago Pain Frequency: Constant     BMI - recorded: 21.64 Nutritional Status: BMI of 19-24  Normal Nutritional Risks: None Diabetes: No  How often do you need to have someone help you when you read instructions, pamphlets, or other written materials from your doctor or pharmacy?: 1 - Never What is the last  grade level you completed in school?: 9th grade  Interpreter Needed?: No  Information entered by :: Clemetine Marker LPN  Past Medical History:  Diagnosis Date  . Anemia   . Cataract   . Depression   . Fall   . GERD (gastroesophageal reflux disease)   . Hyperlipidemia   . Hypertension   . Myocardial infarction [facility])    "mild" many yrs ago   Past Surgical History:  Procedure Laterality Date  . ABDOMINAL HYSTERECTOMY    . COLONOSCOPY WITH PROPOFOL N/A 12/05/2017   Procedure: COLONOSCOPY WITH PROPOFOL;  Surgeon: Lucilla Lame, MD;  Location: [place];  Service: Endoscopy;  Laterality: N/A;  . ESOPHAGOGASTRODUODENOSCOPY (EGD) WITH PROPOFOL N/A 12/05/2017   Procedure: ESOPHAGOGASTRODUODENOSCOPY (EGD) WITH PROPOFOL;  Surgeon: Lucilla Lame, MD;  Location: [place];  Service: Endoscopy;  Laterality: N/A;  . POLYPECTOMY  12/05/2017   Procedure: POLYPECTOMY;  Surgeon: Lucilla Lame, MD;  Location: [facility];  Service: Endoscopy;;  . TONSILLECTOMY     Family History  Problem Relation Age of Onset  . Heart disease Mother   . Brain cancer Mother   . Heart attack Father   . Liver disease Daughter   . Throat cancer Brother   . Cancer Maternal Grandfather        Unsure type   Social History   Socioeconomic History  . Marital status: Widowed    Spouse name: Not on file  . Number of children:  4  . Years of education: Not on file  . Highest education level: 9th grade  Occupational History  . Occupation: Retired  Scientific laboratory technician  . Financial resource strain: Not hard at all  . Food insecurity:    Worry: Never true    Inability: Never true  . Transportation needs:    Medical: No    Non-medical: No  Tobacco Use  . Smoking status: Never Smoker  . Smokeless tobacco: Never Used  . Tobacco comment: smoking cessation materials not required  Substance and Sexual Activity  . Alcohol use: No  . Drug use: No  . Sexual activity: Not Currently  Lifestyle  .  Physical activity:    Days per week: 7 days    Minutes per session: 20 min  . Stress: Only a little  Relationships  . Social connections:    Talks on phone: More than three times a week    Gets together: Three times a week    Attends religious service: More than 4 times per year    Active member of club or organization: No    Attends meetings of clubs or organizations: Never    Relationship status: Widowed  Other Topics Concern  . Not on file  Social History Narrative  . Not on file    Outpatient Encounter Medications as of 07/19/2018  Medication Sig  . acetaminophen (TYLENOL) 500 MG tablet Take 2 tablets (1,000 mg total) by mouth 2 (two) times daily.  Marland Kitchen amLODipine (NORVASC) 10 MG tablet TAKE 1 TABLET BY MOUTH DAILY  . aspirin 325 MG tablet Take 325 mg by mouth daily.  Marland Kitchen atorvastatin (LIPITOR) 40 MG tablet TAKE 1 TABLET(40 MG) BY MOUTH DAILY  . cholecalciferol (VITAMIN D3) 25 MCG (1000 UT) tablet Take 1,000 Units by mouth daily.  Marland Kitchen ENSURE (ENSURE) Take 237 mLs by mouth 2 (two) times daily between meals.  . FEROSUL 325 (65 Fe) MG tablet TAKE 1 TABLET BY MOUTH TWICE DAILY WITH A MEAL  . lisinopril-hydrochlorothiazide (PRINZIDE,ZESTORETIC) 20-12.5 MG tablet TAKE 2 TABLETS BY MOUTH DAILY  . Multiple Vitamins-Calcium (ONE-A-DAY WOMENS PO) Take 1 tablet by mouth daily.  Marland Kitchen nystatin (MYCOSTATIN/NYSTOP) powder Apply topically 2 (two) times daily.  . pantoprazole (PROTONIX) 40 MG tablet TAKE 1 TABLET(40 MG) BY MOUTH DAILY  . polyethylene glycol (GOLYTELY) 236 g solution Drink one 8 oz glass every 20 mins until entire container is finished.  . sertraline (ZOLOFT) 50 MG tablet Take 1 tablet (50 mg total) by mouth daily.  . vitamin C (ASCORBIC ACID) 500 MG tablet Take 1 tablet (500 mg total) by mouth daily.  Marland Kitchen guaiFENesin-codeine (ROBITUSSIN AC) 100-10 MG/5ML syrup Take 5 mLs by mouth 3 (three) times daily as needed for cough. (Patient not taking: Reported on 07/19/2018)   No  facility-administered encounter medications on file as of 07/19/2018.     Activities of Daily Living In your present state of health, do you have any difficulty performing the following activities: 07/19/2018 12/05/2017  Hearing? N N  Comment declines hearing aids -  Vision? Y N  Comment wears glasses, cataracts -  Difficulty concentrating or making decisions? N N  Walking or climbing stairs? N N  Dressing or bathing? N N  Doing errands, shopping? N -  Preparing Food and eating ? N -  Using the Toilet? N -  In the past six months, have you accidently leaked urine? Y -  Do you have problems with loss of bowel control? N -  Managing your  Medications? N -  Managing your Finances? N -  Housekeeping or managing your Housekeeping? N -  Some recent data might be hidden    Patient Care Team: Glean Hess, MD as PCP - General (Internal Medicine) Lucilla Lame, MD as Consulting Physician (Gastroenterology) Earlie Server, MD as Consulting Physician (Oncology)    Assessment:   This is a routine wellness examination for Kayce.  Exercise Activities and Dietary recommendations Current Exercise Habits: Home exercise routine, Type of exercise: calisthenics, Time (Minutes): 20, Frequency (Times/Week): 7, Weekly Exercise (Minutes/Week): 140, Intensity: Mild, Exercise limited by: orthopedic condition(s)  Goals    . DIET - INCREASE WATER INTAKE     Recommend to drink at least 6-8 8oz glasses of water per day.       Fall Risk Fall Risk  07/19/2018 09/29/2017 06/01/2017 10/26/2016 06/07/2016  Falls in the past year? 0 No No No Yes  Number falls in past yr: 0 - - - 1  Injury with Fall? 0 - - - Yes  Risk Factor Category  - - - - High Fall Risk  Risk for fall due to : - - History of fall(s);Impaired vision;Medication side effect - Impaired balance/gait  Risk for fall due to: Comment - - wears eyeglasses - -  Follow up Falls prevention discussed - - - -   FALL RISK PREVENTION PERTAINING TO THE HOME:   Any stairs in or around the home? No  If so, do they handrails? No   Home free of loose throw rugs in walkways, pet beds, electrical cords, etc? Yes  Adequate lighting in your home to reduce risk of falls? Yes   ASSISTIVE DEVICES UTILIZED TO PREVENT FALLS:  Life alert? Yes  Use of a cane, walker or w/c? Yes  Grab bars in the bathroom? No  Shower chair or bench in shower? Yes  Elevated toilet seat or a handicapped toilet? No   DME ORDERS:  DME order needed?  No   TIMED UP AND GO:  Was the test performed? Yes .  Length of time to ambulate 10 feet: 7 sec.   GAIT:  Appearance of gait: Gait stead-fast and with the use of an assistive device.   Education: Fall risk prevention has been discussed.  Intervention(s) required? No    Depression Screen PHQ 2/9 Scores 07/19/2018 01/17/2018 10/24/2017 09/29/2017  PHQ - 2 Score 2 1 0 5  PHQ- 9 Score 6 - 2 9     Cognitive Function     6CIT Screen 07/19/2018 06/01/2017  What Year? 0 points 0 points  What month? 0 points 3 points  What time? 0 points 0 points  Count back from 20 0 points 0 points  Months in reverse 4 points 4 points  Repeat phrase 10 points 0 points  Total Score 14 7    Immunization History  Administered Date(s) Administered  . Influenza, High Dose Seasonal PF 01/31/2017  . Pneumococcal Conjugate-13 06/07/2016  . Pneumococcal Polysaccharide-23 01/17/2018  . Tdap 06/07/2016    Qualifies for Shingles Vaccine? Yes  Zostavax completed per patient. Due for Shingrix. Education has been provided regarding the importance of this vaccine. Pt has been advised to call insurance company to determine out of pocket expense. Advised may also receive vaccine at local pharmacy or Health Dept. Verbalized acceptance and understanding.  Tdap: Up to date  Flu Vaccine: Due for Flu vaccine. Does the patient want to receive this vaccine today?  No . Education has been provided regarding the importance  of this vaccine but still  declined. Advised may receive this vaccine at local pharmacy or Health Dept. Aware to provide a copy of the vaccination record if obtained from local pharmacy or Health Dept. Verbalized acceptance and understanding.  Pneumococcal Vaccine: Up to date   Screening Tests Health Maintenance  Topic Date Due  . INFLUENZA VACCINE  12/08/2017  . TETANUS/TDAP  06/07/2026  . DEXA SCAN  Completed  . PNA vac Low Risk Adult  Completed    Cancer Screenings:  Colorectal Screening: Completed 12/05/17. No longer required.   Mammogram: Not completed in several years per patient, no results available. Repeat every year. Ordered today. Pt provided with contact information and advised to call to schedule appt.   Bone Density: Completed 2014. Results unavailable. Repeat every 2 years. Ordered today. Pt provided with contact information and advised to call to schedule appt.   Lung Cancer Screening: (Low Dose CT Chest recommended if Age 32-80 years, 30 pack-year currently smoking OR have quit w/in 15years.) does not qualify.   Additional Screening:  Hepatitis C Screening: does qualify; Completed 10/06/17  Vision Screening: Recommended annual ophthalmology exams for early detection of glaucoma and other disorders of the eye. Is the patient up to date with their annual eye exam?  Yes  Who is the provider or what is the name of the office in which the pt attends annual eye exams? [facility] Screening: Recommended annual dental exams for proper oral hygiene  Community Resource Referral:  CRR required this visit?  No       Plan:     I have personally reviewed and addressed the Medicare Annual Wellness questionnaire and have noted the following in the patient's chart:  A. Medical and social history B. Use of alcohol, tobacco or illicit drugs  C. Current medications and supplements D. Functional ability and status E.  Nutritional status F.  Physical activity G. Advance directives  H. List of other physicians I.  Hospitalizations, surgeries, and ER visits in previous 12 months J.  [place] such as hearing and vision if needed, cognitive and depression L. Referrals and appointments   In addition, I have reviewed and discussed with patient certain preventive protocols, quality metrics, and best practice recommendations. A written personalized care plan for preventive services as well as general preventive health recommendations were provided to patient.   Signed,  Clemetine Marker, LPN Nurse Health Advisor   Nurse Notes: pt c/o headaches associated with cataracts.. pt also seeing Dr. Army Melia today.

## 2018-07-21 ENCOUNTER — Ambulatory Visit: Payer: Medicare Other | Admitting: Hematology and Oncology

## 2018-07-21 ENCOUNTER — Other Ambulatory Visit: Payer: Medicare Other

## 2018-07-21 NOTE — Progress Notes (Deleted)
[facility] day:  07/21/2018  Chief Complaint: Vanessa Farmer is a 83 y.o. female with anemia of chronic kidney disease, iron deficiency anemia, and intermittent mild thrombocytopenia who is seen for new patient assessment.  HPI:  The patient was initially seen in consultation on 10/06/2017 for evaluation of pancytopenia.  Labs had revealed a hemoglobin 10.9, WBC 2300 with an Lena 1100.  Work-up revealed hematocrit of 32.8, hemoglobin 10.9, MCV 93.9, platelets 152,000, WBC 4300 with an ANC of 2900.  Ferritin was 29 with an iron saturation 10% and TIBC 354.  B12 was 744.  Folate was 45.0.  SPEP was normal.  LDH was 160.  Acute hepatitis panel was negative.  HIV testing was negative.  Flow cytometry revealed no abnormality.  Peripheral smear was unremarkable.  She was felt to have chronic anemia felt secondary to chronic kidney disease.  It was recommended that she take ferrous sulfate 325 mg BID to improve iron stores.  She was referred to Dr Allen Norris for worsening reflux and weight loss.  EGD on 12/05/2017 revealed a 10 cm hiatal hernia, LA Grade C reflux esophagitis, and a normal duodenum.  Pathology revealed no evidence of celiac disease.  Colonoscopy on 12/05/2017 revealed three 1 to 2 mm polyps in the distal sigmoid colon (hyperplastic polyps).  The patient was last seen in the hematology clinic by Dr. Tasia Catchings on 04/20/2018.  At that time, she had chronic fatigue.  She had ongoing issues secondary to scoliosis.  She was taking sertraline with some improvement in her depression.  Weight was stable.  CBC revealed a hematocrit of 34.1, hemoglobin 11.3, MCV 97.7, platelets 139,000, WBC 3500 with an ANC of 2400.  Ferritin was 44.  Iron saturation was 26% with a TIBC of 354.  Labs on 07/19/2018 revealed a hematocrit of 35.6, hemoglobin 11.9, MCV 100, platelets 138,000, WBC 4200 with an ANC of 3000.  Creatinine was 1.28.  Calcium was 9.4, albumen was 4.2.  Protein was  7.2.  TSH was 1.259.  Urinalysis revealed 3+ leukocytes, but no RBCs.  Ferritin has been followed:  29 on 10/06/2017, 34 on 01/18/2018, and 44 on 04/20/2018.  During the interim,   Past Medical History:  Diagnosis Date  . Anemia   . Cataract   . Depression   . Fall   . GERD (gastroesophageal reflux disease)   . Hyperlipidemia   . Hypertension   . Myocardial infarction [facility])    "mild" many yrs ago    Past Surgical History:  Procedure Laterality Date  . ABDOMINAL HYSTERECTOMY    . COLONOSCOPY WITH PROPOFOL N/A 12/05/2017   Procedure: COLONOSCOPY WITH PROPOFOL;  Surgeon: Lucilla Lame, MD;  Location: [place];  Service: Endoscopy;  Laterality: N/A;  . ESOPHAGOGASTRODUODENOSCOPY (EGD) WITH PROPOFOL N/A 12/05/2017   Procedure: ESOPHAGOGASTRODUODENOSCOPY (EGD) WITH PROPOFOL;  Surgeon: Lucilla Lame, MD;  Location: [place];  Service: Endoscopy;  Laterality: N/A;  . POLYPECTOMY  12/05/2017   Procedure: POLYPECTOMY;  Surgeon: Lucilla Lame, MD;  Location: [facility];  Service: Endoscopy;;  . TONSILLECTOMY      Family History  Problem Relation Age of Onset  . Heart disease Mother   . Brain cancer Mother   . Heart attack Father   . Liver disease Daughter   . Throat cancer Brother   . Cancer Maternal Grandfather        Unsure type    Social History:  reports that she has never smoked.  She has never used smokeless tobacco. She reports that she does not drink alcohol or use drugs.  She is widowed.  She lives in [place].  The patient is accompanied by *** alone today.  Allergies: No Known Allergies  Current Medications: Current Outpatient Medications  Medication Sig Dispense Refill  . acetaminophen (TYLENOL) 500 MG tablet Take 2 tablets (1,000 mg total) by mouth 2 (two) times daily. 30 tablet   . amLODipine (NORVASC) 10 MG tablet TAKE 1 TABLET BY MOUTH DAILY 30 tablet 2  . aspirin 325 MG tablet Take 325 mg by mouth daily.    Marland Kitchen atorvastatin (LIPITOR)  40 MG tablet TAKE 1 TABLET(40 MG) BY MOUTH DAILY 90 tablet 0  . cholecalciferol (VITAMIN D3) 25 MCG (1000 UT) tablet Take 1,000 Units by mouth daily.    Marland Kitchen ENSURE (ENSURE) Take 237 mLs by mouth 2 (two) times daily between meals. 237 mL 12  . FEROSUL 325 (65 Fe) MG tablet TAKE 1 TABLET BY MOUTH TWICE DAILY WITH A MEAL 60 tablet 0  . lisinopril-hydrochlorothiazide (PRINZIDE,ZESTORETIC) 20-12.5 MG tablet TAKE 2 TABLETS BY MOUTH DAILY 180 tablet 0  . Multiple Vitamins-Calcium (ONE-A-DAY WOMENS PO) Take 1 tablet by mouth daily.    Marland Kitchen nystatin (MYCOSTATIN/NYSTOP) powder Apply topically 2 (two) times daily. 45 g 0  . pantoprazole (PROTONIX) 40 MG tablet TAKE 1 TABLET(40 MG) BY MOUTH DAILY 90 tablet 2  . sertraline (ZOLOFT) 50 MG tablet Take 1 tablet (50 mg total) by mouth daily. 30 tablet 5  . vitamin C (ASCORBIC ACID) 500 MG tablet Take 1 tablet (500 mg total) by mouth daily. 30 tablet 2   No current facility-administered medications for this visit.     Review of Systems:  GENERAL:  Feels good.  Active.  No fevers, sweats or weight loss. PERFORMANCE STATUS (ECOG):  *** HEENT:  No visual changes, runny nose, sore throat, mouth sores or tenderness. Lungs: No shortness of breath or cough.  No hemoptysis. Cardiac:  No chest pain, palpitations, orthopnea, or PND. GI:  No nausea, vomiting, diarrhea, constipation, melena or hematochezia. GU:  No urgency, frequency, dysuria, or hematuria. Musculoskeletal:  No back pain.  No joint pain.  No muscle tenderness. Extremities:  No pain or swelling. Skin:  No rashes or skin changes. Neuro:  No headache, numbness or weakness, balance or coordination issues. Endocrine:  No diabetes, thyroid issues, hot flashes or night sweats. Psych:  No mood changes, depression or anxiety. Pain:  No focal pain. Review of systems:  All other systems reviewed and found to be negative.  Physical Exam: There were no vitals taken for this visit. GENERAL:  Well developed, well  nourished, **man sitting comfortably in the exam room in no acute distress. MENTAL STATUS:  Alert and oriented to person, place and time. HEAD:  *** hair.  Normocephalic, atraumatic, face symmetric, no Cushingoid features. EYES:  *** eyes.  Pupils equal round and reactive to light and accomodation.  No conjunctivitis or scleral icterus. ENT:  Oropharynx clear without lesion.  Tongue normal. Mucous membranes moist.  RESPIRATORY:  Clear to auscultation without rales, wheezes or rhonchi. CARDIOVASCULAR:  Regular rate and rhythm without murmur, rub or gallop. ABDOMEN:  Soft, non-tender, with active bowel sounds, and no hepatosplenomegaly.  No masses. SKIN:  No rashes, ulcers or lesions. EXTREMITIES: No edema, no skin discoloration or tenderness.  No palpable cords. LYMPH NODES: No palpable cervical, supraclavicular, axillary or inguinal adenopathy  NEUROLOGICAL: Unremarkable. PSYCH:  Appropriate.   Hospital Outpatient Visit on  07/19/2018  Component Date Value Ref Range Status  . WBC 07/19/2018 4.2  4.0 - 10.5 K/uL Final  . RBC 07/19/2018 3.56* 3.87 - 5.11 MIL/uL Final  . Hemoglobin 07/19/2018 11.9* 12.0 - 15.0 g/dL Final  . HCT 07/19/2018 35.6* 36.0 - 46.0 % Final  . MCV 07/19/2018 100.0  80.0 - 100.0 fL Final  . MCH 07/19/2018 33.4  26.0 - 34.0 pg Final  . MCHC 07/19/2018 33.4  30.0 - 36.0 g/dL Final  . RDW 07/19/2018 13.2  11.5 - 15.5 % Final  . Platelets 07/19/2018 138* 150 - 400 K/uL Final  . nRBC 07/19/2018 0.0  0.0 - 0.2 % Final  . Neutrophils Relative % 07/19/2018 72  % Final  . Neutro Abs 07/19/2018 3.0  1.7 - 7.7 K/uL Final  . Lymphocytes Relative 07/19/2018 18  % Final  . Lymphs Abs 07/19/2018 0.8  0.7 - 4.0 K/uL Final  . Monocytes Relative 07/19/2018 8  % Final  . Monocytes Absolute 07/19/2018 0.3  0.1 - 1.0 K/uL Final  . Eosinophils Relative 07/19/2018 1  % Final  . Eosinophils Absolute 07/19/2018 0.1  0.0 - 0.5 K/uL Final  . Basophils Relative 07/19/2018 0  % Final  .  Basophils Absolute 07/19/2018 0.0  0.0 - 0.1 K/uL Final  . Immature Granulocytes 07/19/2018 1  % Final  . Abs Immature Granulocytes 07/19/2018 0.02  0.00 - 0.07 K/uL Final   Performed at [facility], [address] [place], [place] [address]  . Sodium 07/19/2018 137  135 - 145 mmol/L Final  . Potassium 07/19/2018 3.8  3.5 - 5.1 mmol/L Final  . Chloride 07/19/2018 98  98 - 111 mmol/L Final  . CO2 07/19/2018 28  22 - 32 mmol/L Final  . Glucose, Bld 07/19/2018 114* 70 - 99 mg/dL Final  . BUN 07/19/2018 19  8 - 23 mg/dL Final  . Creatinine, Ser 07/19/2018 1.28* 0.44 - 1.00 mg/dL Final  . Calcium 07/19/2018 9.4  8.9 - 10.3 mg/dL Final  . Total Protein 07/19/2018 7.2  6.5 - 8.1 g/dL Final  . Albumin 07/19/2018 4.2  3.5 - 5.0 g/dL Final  . AST 07/19/2018 27  15 - 41 U/L Final  . ALT 07/19/2018 26  0 - 44 U/L Final  . Alkaline Phosphatase 07/19/2018 65  38 - 126 U/L Final  . Total Bilirubin 07/19/2018 0.9  0.3 - 1.2 mg/dL Final  . GFR calc non Af Amer 07/19/2018 39* >60 mL/min Final  . GFR calc Af Amer 07/19/2018 46* >60 mL/min Final  . Anion gap 07/19/2018 11  5 - 15 Final   Performed at [facility], [address] [place], [place] [address]  . Cholesterol 07/19/2018 134  0 - 200 mg/dL Final  . Triglycerides 07/19/2018 81  <150 mg/dL Final  . HDL 07/19/2018 59  >40 mg/dL Final  . Total CHOL/HDL Ratio 07/19/2018 2.3  RATIO Final  . VLDL 07/19/2018 16  0 - 40 mg/dL Final  . LDL Cholesterol 07/19/2018 59  0 - 99 mg/dL Final   Comment:        Total Cholesterol/HDL:CHD Risk Coronary Heart Disease Risk Table                     Men   Women  1/2 Average Risk   3.4   3.3  Average Risk       5.0   4.4  2 X Average Risk   9.6  7.1  3 X Average Risk  23.4   11.0        Use the calculated Patient Ratio above and the CHD Risk Table to determine the patient's CHD Risk.        ATP III CLASSIFICATION (LDL):  <100     mg/dL   Optimal  100-129  mg/dL   Near or Above                     Optimal  130-159  mg/dL   Borderline  160-189  mg/dL   High  >190     mg/dL   Very High Performed at [facility], [address], [place], [place] [address]   . TSH 07/19/2018 1.259  0.350 - 4.500 uIU/mL Final   Comment: Performed by a 3rd Generation assay with a functional sensitivity of <=0.01 uIU/mL. Performed at [facility], [address], [place], [place] [address]   Office Visit on 07/19/2018  Component Date Value Ref Range Status  . Color, UA 07/19/2018 amber   Final  . Clarity, UA 07/19/2018 clear   Final  . Glucose, UA 07/19/2018 Negative  Negative Final  . Bilirubin, UA 07/19/2018 neg   Final  . Ketones, UA 07/19/2018 neg   Final  . Spec Grav, UA 07/19/2018 1.015  1.010 - 1.025 Final  . Blood, UA 07/19/2018 neg   Final  . pH, UA 07/19/2018 6.0  5.0 - 8.0 Final  . Protein, UA 07/19/2018 Negative  Negative Final  . Urobilinogen, UA 07/19/2018 0.2  0.2 or 1.0 E.U./dL Final  . Nitrite, UA 07/19/2018 neg   Final  . Leukocytes, UA 07/19/2018 Large (3+)* Negative Final   3 WBC/hpf  . Appearance 07/19/2018 clear   Final  . Odor 07/19/2018 none   Final    Assessment:  Vanessa Farmer is a 83 y.o. female with a history of pancytopenia.  Work-up on 10/06/2017 revealed hematocrit of 32.8, hemoglobin 10.9, MCV 93.9, platelets 152,000, WBC 4300 with an ANC of 2900.  Ferritin was 29 with an iron saturation 10% and TIBC 354.  Normal studies included B12 (744), folate, SPEP, LDH, acute hepatitis panel, HIV testing, and flow cytometry.  Peripheral smear was unremarkable.  She has anemia of chronic kidney disease.  She has been on ferrous sulfate 325 mg BID to improve iron stores since 10/20/2017.  EGD on 12/05/2017 revealed a 10 cm hiatal hernia, LA Grade C reflux esophagitis, and a normal duodenum.  Pathology revealed no evidence of celiac disease.  Colonoscopy on 12/05/2017 revealed three 1 to 2 mm polyps in the distal sigmoid colon (hyperplastic  polyps).  Ferritin has been followed:  29 on 10/06/2017, 34 on 01/18/2018, and 44 on 04/20/2018.  Plan: 1.   Labs today:  CBC with diff, ferritin, iron studies. 2.   Anemia of chronic kidney disease  CrCl 25.2 ml/min. 3.   Iron deficiency   4.      Lequita Asal, MD  07/21/2018, 8:49 AM

## 2018-08-04 ENCOUNTER — Other Ambulatory Visit: Payer: Self-pay | Admitting: *Deleted

## 2018-08-04 ENCOUNTER — Other Ambulatory Visit: Payer: Self-pay | Admitting: Internal Medicine

## 2018-08-04 MED ORDER — FERROUS SULFATE 325 (65 FE) MG PO TABS: ORAL_TABLET | ORAL | 0 refills | Status: DC

## 2018-09-05 ENCOUNTER — Other Ambulatory Visit: Payer: Self-pay

## 2018-09-05 ENCOUNTER — Other Ambulatory Visit: Payer: Self-pay | Admitting: Gastroenterology

## 2018-09-06 ENCOUNTER — Ambulatory Visit: Payer: Medicare Other | Admitting: Hematology and Oncology

## 2018-09-06 ENCOUNTER — Other Ambulatory Visit: Payer: Medicare Other

## 2018-09-07 ENCOUNTER — Ambulatory Visit: Payer: Medicare Other | Admitting: Hematology and Oncology

## 2018-09-17 ENCOUNTER — Other Ambulatory Visit: Payer: Self-pay | Admitting: Internal Medicine

## 2018-09-17 DIAGNOSIS — I10 Essential (primary) hypertension: Secondary | ICD-10-CM

## 2018-09-19 ENCOUNTER — Other Ambulatory Visit: Payer: Self-pay

## 2018-09-20 ENCOUNTER — Inpatient Hospital Stay: Payer: Medicare Other

## 2018-09-20 ENCOUNTER — Other Ambulatory Visit: Payer: Self-pay

## 2018-09-21 ENCOUNTER — Inpatient Hospital Stay: Payer: Medicare Other | Admitting: Hematology and Oncology

## 2018-09-21 ENCOUNTER — Inpatient Hospital Stay: Payer: Medicare Other | Attending: Hematology and Oncology

## 2018-09-21 DIAGNOSIS — D509 Iron deficiency anemia, unspecified: Secondary | ICD-10-CM | POA: Diagnosis not present

## 2018-09-21 DIAGNOSIS — D500 Iron deficiency anemia secondary to blood loss (chronic): Secondary | ICD-10-CM

## 2018-09-21 DIAGNOSIS — D696 Thrombocytopenia, unspecified: Secondary | ICD-10-CM

## 2018-09-21 LAB — CBC WITH DIFFERENTIAL/PLATELET
Abs Immature Granulocytes: 0.02 10*3/uL (ref 0.00–0.07)
Basophils Absolute: 0 10*3/uL (ref 0.0–0.1)
Basophils Relative: 1 %
Eosinophils Absolute: 0.1 10*3/uL (ref 0.0–0.5)
Eosinophils Relative: 2 %
HCT: 34.5 % — ABNORMAL LOW (ref 36.0–46.0)
Hemoglobin: 11.4 g/dL — ABNORMAL LOW (ref 12.0–15.0)
Immature Granulocytes: 1 %
Lymphocytes Relative: 20 %
Lymphs Abs: 0.9 10*3/uL (ref 0.7–4.0)
MCH: 33.3 pg (ref 26.0–34.0)
MCHC: 33 g/dL (ref 30.0–36.0)
MCV: 100.9 fL — ABNORMAL HIGH (ref 80.0–100.0)
Monocytes Absolute: 0.4 10*3/uL (ref 0.1–1.0)
Monocytes Relative: 10 %
Neutro Abs: 3 10*3/uL (ref 1.7–7.7)
Neutrophils Relative %: 66 %
Platelets: 143 10*3/uL — ABNORMAL LOW (ref 150–400)
RBC: 3.42 MIL/uL — ABNORMAL LOW (ref 3.87–5.11)
RDW: 13.3 % (ref 11.5–15.5)
WBC: 4.4 10*3/uL (ref 4.0–10.5)
nRBC: 0 % (ref 0.0–0.2)

## 2018-09-21 LAB — IRON AND TIBC
Iron: 41 ug/dL (ref 28–170)
Saturation Ratios: 14 % (ref 10.4–31.8)
TIBC: 292 ug/dL (ref 250–450)
UIBC: 251 ug/dL

## 2018-09-21 LAB — FERRITIN: Ferritin: 88 ng/mL (ref 11–307)

## 2018-09-22 ENCOUNTER — Other Ambulatory Visit: Payer: Self-pay

## 2018-09-25 ENCOUNTER — Inpatient Hospital Stay: Payer: Medicare Other | Admitting: Hematology and Oncology

## 2018-09-26 ENCOUNTER — Other Ambulatory Visit: Payer: Self-pay

## 2018-09-26 NOTE — Progress Notes (Signed)
[facility]  [address] [place], [place] [address] Phone: [phone]  Fax: [phone]   Telemedicine Office Visit:  09/27/2018  Referring physician: Glean Hess, MD  I connected with Vanessa Farmer on 09/27/2018 at 9:19 AM by videoconferencing and verified that I was speaking with the correct person using 2 identifiers.  The patient was in her car.  I discussed the limitations, risk, security and privacy concerns of performing an evaluation and management service by videoconferencing and the availability of in person appointments.  I also discussed with the patient that there may be a patient responsible charge related to this service.  The patient expressed understanding and agreed to proceed.   Chief Complaint: Vanessa Farmer is a 83 y.o. female with mild thrombocytopenia and iron deficiency anemia who is seen for new patient assessment.   HPI:   The patient was initially diagnosed with pancytopenia after lab work was done by her PCP, Dr. Halina Maidens.  Labs on 09/29/2017 showed platelets 146,000, hemoglobin 10.9, WBC 2,300, and ANC 1,100.   Creatinine was 1.34 (CrCl 36 ml/min).  She was asymptomatic at the time.   She was initially seen in the hematology clinic by Dr. Earlie Server on 10/06/2017.  Work-up revealed a WBC 4,300 ([place] 2,900), hemoglobin 10.9, hematocrit 32.8, platelets 152,000. Ferritin was 29. Iron saturation 10%, TIBC 354.  Peripheral smear was unremarkable. B12 was 744. Folate was 45.0. Hepatitis panel and HIV testing was negative. LDH 160. SPEP normal. Flow cytometry was negative.   Abdominal ultrasound on 10/11/2017 revealed nonshadowing gallstones with mild sludge in the gallbladder. The gallbladder wall did not appear appreciably thickened, and there was no pericholecystic fluid evident. The left kidney was rather small.  Spleen was normal in size and contour.   Anemia was attributed to chronic kidney disease. She was started on  oral iron, 325mg  twice daily, on 10/20/2017 to improve iron stores.  She was referred to Dr Allen Norris for weight loss and chronic reflux.  EGD on 12/05/2017 revealed a 10 cm hiatal hernia and grade C reflux esophagitis. Examined duodenum was normal. She was started on PPI.  Colonoscopy on 12/05/2017 revealed three 1-2 mm polyps in the distal sigmoid colon. There were non-bleeding internal hemorrhoids. Pathology revealed hyperplastic polyps and small bowel mucosa with no significant pathologic changes.   Ferritin has been followed: 29 on 10/06/2017, 34 on 01/18/2018, 44 on 04/20/2018, 88 on 09/21/2018.   Platelets have been followed: 146,000 on 09/29/2017, 152,000 on 10/06/2017, 158,000 on 01/18/2018, 139,000 on 04/20/2018, 138,000 on 07/19/2018, 143,000 on 09/21/2018.   The patient was last seen in the hematology clinic on 04/20/2018 by Dr. Tasia Catchings.   At that time, she was doing well and at her baseline. She was chronically fatigued. She was depressed due to her scoliosis. She was taking sertraline 50mg  daily.  Appetite was good. Weight was stable.   CBCs followed: 07/19/2018: WBC 4,200 (ANC 3,000), hemoglobin 11.9, hematocrit 35.6, platelets 138,000.  09/21/2018: WBC 4,400 (ANC 3,000), hemoglobin 11.4, hematocrit 34.5, platelets 143,000.   Ferritin was 88.  Iron saturation was 14% with a TIBC of 292.  During the interim, the patient is doing well. She reports her weight is good, and she is gaining it back.  Acid reflux is mild, improved with medication. She has shortness of breath with exertion. She notes significant swelling in her legs and feet yesterday, that occurs often and is followed by her PCP.  She eats a hamburger and green  leafy vegetables every day. She denies any blood in her urine, stool, or vaginal bleeding.  She continues on oral iron, 1x daily, in addition to Vitamin C.   She has scoliosis and occasional back pain. She reports her body image is negatively impacted by the scoliosis.  She  has cataracts and is hoping to get them removed soon.   Past Medical History:  Diagnosis Date   Anemia    Cataract    Depression    Fall    GERD (gastroesophageal reflux disease)    Hyperlipidemia    Hypertension    Myocardial infarction ([place])    "mild" many yrs ago    Past Surgical History:  Procedure Laterality Date   ABDOMINAL HYSTERECTOMY     COLONOSCOPY WITH PROPOFOL N/A 12/05/2017   Procedure: COLONOSCOPY WITH PROPOFOL;  Surgeon: Lucilla Lame, MD;  Location: [place];  Service: Endoscopy;  Laterality: N/A;   ESOPHAGOGASTRODUODENOSCOPY (EGD) WITH PROPOFOL N/A 12/05/2017   Procedure: ESOPHAGOGASTRODUODENOSCOPY (EGD) WITH PROPOFOL;  Surgeon: Lucilla Lame, MD;  Location: [facility];  Service: Endoscopy;  Laterality: N/A;   POLYPECTOMY  12/05/2017   Procedure: POLYPECTOMY;  Surgeon: Lucilla Lame, MD;  Location: [facility];  Service: Endoscopy;;   TONSILLECTOMY      Family History  Problem Relation Age of Onset   Heart disease Mother    Brain cancer Mother    Heart attack Father    Liver disease Daughter    Throat cancer Brother    Cancer Maternal Grandfather        Unsure type    Social History:  reports that she has never smoked. She has never used smokeless tobacco. She reports that she does not drink alcohol or use drugs. she is originally from [place]. She lives with her granddaughter Vanessa Farmer. The patient is accompanied by her daughter, Vanessa Farmer, today.  Participants in the patient's visit and their role in the encounter included the patient, her daughter Vanessa Farmer, and Waymon Budge, Therapist, sports, today.  The intake visit was provided by Waymon Budge, RN.  Allergies: No Known Allergies  Current Medications: Current Outpatient Medications  Medication Sig Dispense Refill   acetaminophen (TYLENOL) 500 MG tablet Take 2 tablets (1,000 mg total) by mouth 2 (two) times daily. (Patient taking differently: Take 1,000 mg by mouth  every 6 (six) hours as needed. ) 30 tablet    amLODipine (NORVASC) 10 MG tablet TAKE 1 TABLET BY MOUTH DAILY 30 tablet 2   aspirin 325 MG tablet Take 325 mg by mouth daily.     atorvastatin (LIPITOR) 40 MG tablet TAKE 1 TABLET(40 MG) BY MOUTH DAILY 90 tablet 1   cholecalciferol (VITAMIN D3) 25 MCG (1000 UT) tablet Take 1,000 Units by mouth daily.     ENSURE (ENSURE) Take 237 mLs by mouth 2 (two) times daily between meals. 237 mL 12   ferrous sulfate (FEROSUL) 325 (65 FE) MG tablet TAKE 1 TABLET BY MOUTH TWICE DAILY WITH A MEAL 60 tablet 0   lisinopril-hydrochlorothiazide (ZESTORETIC) 20-12.5 MG tablet TAKE 2 TABLETS BY MOUTH DAILY 180 tablet 0   Multiple Vitamins-Calcium (ONE-A-DAY WOMENS PO) Take 1 tablet by mouth daily.     pantoprazole (PROTONIX) 40 MG tablet TAKE 1 TABLET BY MOUTH DAILY 90 tablet 2   sertraline (ZOLOFT) 50 MG tablet Take 1 tablet (50 mg total) by mouth daily. 30 tablet 5   vitamin C (ASCORBIC ACID) 500 MG tablet Take 1 tablet (500 mg total) by mouth daily. 30 tablet 2  nystatin (MYCOSTATIN/NYSTOP) powder Apply topically 2 (two) times daily. (Patient not taking: Reported on 09/27/2018) 45 g 0   No current facility-administered medications for this visit.     Review of Systems  Constitutional: Positive for malaise/fatigue. Negative for chills, diaphoresis, fever and weight loss (stable).  HENT: Negative for congestion, hearing loss, nosebleeds and sore throat.   Eyes: Negative for double vision and pain.       Cataracts.  Respiratory: Positive for shortness of breath (exertional). Negative for cough and wheezing.   Cardiovascular: Positive for leg swelling. Negative for chest pain, palpitations and PND.  Gastrointestinal: Positive for heartburn. Negative for abdominal pain, blood in stool, constipation, diarrhea, nausea and vomiting.  Genitourinary: Negative for dysuria and urgency.  Musculoskeletal: Positive for back pain (scoliosis). Negative for joint pain  and myalgias.  Skin: Negative for rash.  Neurological: Negative for dizziness, tingling, sensory change, weakness and headaches.  Endo/Heme/Allergies: Does not bruise/bleed easily.  Psychiatric/Behavioral: Positive for depression. Negative for memory loss. The patient is not nervous/anxious and does not have insomnia.   All other systems reviewed and are negative.   Performance status (ECOG):  1-2  Physical Exam  Constitutional: She is oriented to person, place, and time. She appears well-developed and well-nourished. No distress.  HENT:  Head: Normocephalic and atraumatic.  Gray hair.  Edentulous.  Eyes: Conjunctivae and EOM are normal. No scleral icterus.  Glasses.  Neurological: She is alert and oriented to person, place, and time.  Skin: She is not diaphoretic.  Psychiatric: Her behavior is normal. Judgment and thought content normal.  Nursing note reviewed.   No visits with results within 3 Day(s) from this visit.  Latest known visit with results is:  Appointment on 09/21/2018  Component Date Value Ref Range Status   Ferritin 09/21/2018 88  11 - 307 ng/mL Final   Performed at [facility], [address] [place], [place] [address]   Iron 09/21/2018 41  28 - 170 ug/dL Final   TIBC 09/21/2018 292  250 - 450 ug/dL Final   Saturation Ratios 09/21/2018 14  10.4 - 31.8 % Final   UIBC 09/21/2018 251  ug/dL Final   Performed at [facility], [address] [place], [place] [address]   WBC 09/21/2018 4.4  4.0 - 10.5 K/uL Final   RBC 09/21/2018 3.42* 3.87 - 5.11 MIL/uL Final   Hemoglobin 09/21/2018 11.4* 12.0 - 15.0 g/dL Final   HCT 09/21/2018 34.5* 36.0 - 46.0 % Final   MCV 09/21/2018 100.9* 80.0 - 100.0 fL Final   MCH 09/21/2018 33.3  26.0 - 34.0 pg Final   MCHC 09/21/2018 33.0  30.0 - 36.0 g/dL Final   RDW 09/21/2018 13.3  11.5 - 15.5 % Final   Platelets 09/21/2018 143* 150 - 400 K/uL Final   nRBC 09/21/2018 0.0  0.0 - 0.2 % Final    Neutrophils Relative % 09/21/2018 66  % Final   Neutro Abs 09/21/2018 3.0  1.7 - 7.7 K/uL Final   Lymphocytes Relative 09/21/2018 20  % Final   Lymphs Abs 09/21/2018 0.9  0.7 - 4.0 K/uL Final   Monocytes Relative 09/21/2018 10  % Final   Monocytes Absolute 09/21/2018 0.4  0.1 - 1.0 K/uL Final   Eosinophils Relative 09/21/2018 2  % Final   Eosinophils Absolute 09/21/2018 0.1  0.0 - 0.5 K/uL Final   Basophils Relative 09/21/2018 1  % Final   Basophils Absolute 09/21/2018 0.0  0.0 - 0.1 K/uL Final   Immature  Granulocytes 09/21/2018 1  % Final   Abs Immature Granulocytes 09/21/2018 0.02  0.00 - 0.07 K/uL Final   Performed at [facility], [address], [place], [place] [address]    Assessment:  JULIEANN DRUMMONDS is a 83 y.o. female with mild thrombocytopenia and iron deficiency anemia.  Platelet count has ranged between 138,000 - 158,000.  Diet is good.  She is on oral iron.  Work-up on 10/06/2017 revealed a hematocrit of 32.8, hemoglobin 10.9, MCV x, WBC 4,300 (ANC 2,900), and platelets 152,000.  Ferritin was 29 with an iron saturation 10% and TIBC 354.  Normal studies included:  B12, folate, acute hepatitis panel, HIV testing, LDH 160, SPEP, and flow cytometry.   Peripheral smear was unremarkable.  Urinalysis on 10/24/2017 revealed no hematuria.  TSH was normal on 09/29/2017.  Abdominal ultrasound on 10/11/2017 revealed a normal spleen.   EGD on 12/05/2017 revealed a 10 cm hiatal hernia and grade C reflux esophagitis. Examined duodenum was normal.  Colonoscopy on 12/05/2017 revealed three 1-2 mm polyps in the distal sigmoid colon. There were non-bleeding internal hemorrhoids. Pathology revealed hyperplastic polyps and small bowel mucosa with no significant pathologic changes.   Ferritin has been followed: 29 on 10/06/2017, 34 on 01/18/2018, 44 on 04/20/2018, 88 on 09/21/2018.   She has chronic kidney disease. Creatinine was (CrCl 36 ml/min) on  09/29/2017.  Symptomatically, she is doing well.  Reflux is well managed.  She denies any melena, hematochezia, hematuria or vaginal bleeding.  Plan: 1.  Review labs from 09/21/2018. 2.  Iron deficiency anemia  Review entire medical history, diagnosis and management of iron deficiency.  Diet appears good.  She denies any bleeding.  EGD and colonoscopy in 11/2017 revealed reflux only.  She has not had a capsule study.  Hemoglobin is 11.4.  MCV is 100.9.    Ferritin is 88.  Iron saturation is 14% with a TIBC of 292.Marland Kitchen   Ferritin goal 100.  Continue oral iron with OJ or vitamin C. 3. Thrombocytopenia  Etiology unclear.  Work-up to date is negative.  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  Check additional labs at next labs draw.     r/o pseudothrombocytopenia (platelet count in a blue top tube).   r/o autoimmune etiology, hepatitis B, lupus anticoagulant. 4.  RTC in 3 months for labs (CBC with diff, platelet count in a blue top tube, PTT, ANA, hepatitis B core antibody total, ferritin). 5.  RTC in 6 months for MD assessment and labs (CBC with diff +/- others).  I discussed the assessment and treatment plan with the patient.  The patient was provided an opportunity to ask questions and all were answered.  The patient agreed with the plan and demonstrated an understanding of the instructions.  The patient was advised to call back or seek an in person evaluation if the symptoms worsen or if the condition fails to improve as anticipated.   Nolon Stalls, MD, PhD  09/27/2018, 9:19 AM  I, Cloyde Reams Dorshimer, am acting as Education administrator for [employer]. Mike Gip, MD, PhD.  I, Italia Wolfert C. Mike Gip, MD, have reviewed the above documentation for accuracy and completeness, and I agree with the above.

## 2018-09-27 ENCOUNTER — Encounter: Payer: Self-pay | Admitting: Hematology and Oncology

## 2018-09-27 ENCOUNTER — Other Ambulatory Visit: Payer: Self-pay | Admitting: Internal Medicine

## 2018-09-27 ENCOUNTER — Inpatient Hospital Stay (HOSPITAL_BASED_OUTPATIENT_CLINIC_OR_DEPARTMENT_OTHER): Payer: Medicare Other | Admitting: Hematology and Oncology

## 2018-09-27 DIAGNOSIS — K219 Gastro-esophageal reflux disease without esophagitis: Secondary | ICD-10-CM | POA: Diagnosis not present

## 2018-09-27 DIAGNOSIS — D509 Iron deficiency anemia, unspecified: Secondary | ICD-10-CM

## 2018-09-27 DIAGNOSIS — D631 Anemia in chronic kidney disease: Secondary | ICD-10-CM

## 2018-09-27 DIAGNOSIS — N183 Chronic kidney disease, stage 3 unspecified: Secondary | ICD-10-CM

## 2018-09-27 DIAGNOSIS — Z7982 Long term (current) use of aspirin: Secondary | ICD-10-CM

## 2018-09-27 DIAGNOSIS — I10 Essential (primary) hypertension: Secondary | ICD-10-CM

## 2018-09-27 DIAGNOSIS — D696 Thrombocytopenia, unspecified: Secondary | ICD-10-CM | POA: Diagnosis not present

## 2018-09-27 NOTE — Progress Notes (Signed)
Confirmed name, DOB, and address. Denies any concerns at this time.  

## 2018-11-15 DIAGNOSIS — H2513 Age-related nuclear cataract, bilateral: Secondary | ICD-10-CM | POA: Diagnosis not present

## 2018-12-14 ENCOUNTER — Other Ambulatory Visit: Payer: Self-pay | Admitting: Internal Medicine

## 2018-12-27 ENCOUNTER — Inpatient Hospital Stay: Payer: Medicare Other | Attending: Hematology and Oncology

## 2019-01-19 ENCOUNTER — Ambulatory Visit: Payer: Medicare Other | Admitting: Internal Medicine

## 2019-02-09 ENCOUNTER — Other Ambulatory Visit: Payer: Self-pay | Admitting: Internal Medicine

## 2019-02-09 DIAGNOSIS — F321 Major depressive disorder, single episode, moderate: Secondary | ICD-10-CM

## 2019-02-21 ENCOUNTER — Ambulatory Visit: Payer: Medicare Other | Admitting: Internal Medicine

## 2019-02-28 ENCOUNTER — Ambulatory Visit: Payer: Medicare Other | Admitting: Internal Medicine

## 2019-03-20 ENCOUNTER — Other Ambulatory Visit: Payer: Self-pay

## 2019-03-20 ENCOUNTER — Encounter: Payer: Self-pay | Admitting: Internal Medicine

## 2019-03-20 ENCOUNTER — Ambulatory Visit (INDEPENDENT_AMBULATORY_CARE_PROVIDER_SITE_OTHER): Payer: Medicare Other | Admitting: Internal Medicine

## 2019-03-20 VITALS — BP 112/64 | HR 75 | Ht 60.0 in | Wt 113.0 lb

## 2019-03-20 DIAGNOSIS — Z23 Encounter for immunization: Secondary | ICD-10-CM | POA: Diagnosis not present

## 2019-03-20 DIAGNOSIS — I10 Essential (primary) hypertension: Secondary | ICD-10-CM | POA: Diagnosis not present

## 2019-03-20 DIAGNOSIS — E785 Hyperlipidemia, unspecified: Secondary | ICD-10-CM | POA: Diagnosis not present

## 2019-03-20 DIAGNOSIS — M67431 Ganglion, right wrist: Secondary | ICD-10-CM | POA: Diagnosis not present

## 2019-03-20 MED ORDER — LISINOPRIL-HYDROCHLOROTHIAZIDE 20-12.5 MG PO TABS: 2.0000 | ORAL_TABLET | Freq: Every day | ORAL | 1 refills | Status: DC

## 2019-03-20 MED ORDER — AMLODIPINE BESYLATE 10 MG PO TABS: 10.0000 mg | ORAL_TABLET | Freq: Every day | ORAL | 1 refills | Status: DC

## 2019-03-20 MED ORDER — ATORVASTATIN CALCIUM 40 MG PO TABS: ORAL_TABLET | ORAL | 1 refills | Status: DC

## 2019-03-20 NOTE — Progress Notes (Signed)
Date:  03/20/2019   Name:  Vanessa Farmer   DOB:  1936/03/22   MRN:  [id number]   Chief Complaint: Hand Pain (High dose flu shot. Right hand. Burns but not painful. )  Hand Pain  There was no injury mechanism. The pain is present in the right wrist. The quality of the pain is described as burning. The pain does not radiate. The pain is mild. The pain has been fluctuating since the incident. Pertinent negatives include no chest pain. The symptoms are aggravated by palpation. She has tried nothing for the symptoms.  Hypertension This is a chronic problem. The problem is controlled. Associated symptoms include shortness of breath. Pertinent negatives include no chest pain, headaches or palpitations. Past treatments include ACE inhibitors, diuretics and calcium channel blockers. The current treatment provides significant improvement.  Hyperlipidemia This is a chronic problem. The problem is controlled. Associated symptoms include shortness of breath. Pertinent negatives include no chest pain. Current antihyperlipidemic treatment includes statins. The current treatment provides significant improvement of lipids. There are no compliance problems.     Lab Results  Component Value Date   CREATININE 1.28 (H) 07/19/2018   BUN 19 07/19/2018   NA 137 07/19/2018   K 3.8 07/19/2018   CL 98 07/19/2018   CO2 28 07/19/2018   Lab Results  Component Value Date   CHOL 134 07/19/2018   HDL 59 07/19/2018   LDLCALC 59 07/19/2018   TRIG 81 07/19/2018   CHOLHDL 2.3 07/19/2018   Lab Results  Component Value Date   TSH 1.259 07/19/2018   No results found for: HGBA1C   Review of Systems  Constitutional: Negative for chills, fatigue and fever.  Eyes: Positive for visual disturbance (need cataract surgery).  Respiratory: Positive for shortness of breath. Negative for cough, chest tightness and wheezing.   Cardiovascular: Negative for chest pain, palpitations and leg swelling.  Gastrointestinal:  Negative for abdominal pain, constipation and diarrhea.  Musculoskeletal: Positive for arthralgias (fell onto hip), back pain and gait problem (uses cane).  Skin: Negative for color change and wound.  Allergic/Immunologic: Negative for environmental allergies.  Neurological: Negative for dizziness, light-headedness and headaches.  Hematological: Negative for adenopathy. Does not bruise/bleed easily.  Psychiatric/Behavioral: Negative for dysphoric mood and sleep disturbance. The patient is not nervous/anxious.     Patient Active Problem List   Diagnosis Date Noted  . Thrombocytopenia ([place]) 07/19/2018  . Iron deficiency anemia   . Polyp of sigmoid colon   . Pancytopenia ([place]) 09/30/2017  . GERD (gastroesophageal reflux disease) 06/10/2017  . CKD (chronic kidney disease) stage 3, GFR 30-59 ml/min 06/08/2016  . Hyperlipidemia 06/08/2016  . Scoliosis 06/07/2016  . Osteoporosis 06/07/2016  . Essential hypertension 06/07/2016  . CAD (coronary artery disease) 06/07/2016  . Depression, major, single episode, moderate ([place]) 06/07/2016    No Known Allergies  Past Surgical History:  Procedure Laterality Date  . ABDOMINAL HYSTERECTOMY    . COLONOSCOPY WITH PROPOFOL N/A 12/05/2017   Procedure: COLONOSCOPY WITH PROPOFOL;  Surgeon: Lucilla Lame, MD;  Location: [facility];  Service: Endoscopy;  Laterality: N/A;  . ESOPHAGOGASTRODUODENOSCOPY (EGD) WITH PROPOFOL N/A 12/05/2017   Procedure: ESOPHAGOGASTRODUODENOSCOPY (EGD) WITH PROPOFOL;  Surgeon: Lucilla Lame, MD;  Location: [place];  Service: Endoscopy;  Laterality: N/A;  . POLYPECTOMY  12/05/2017   Procedure: POLYPECTOMY;  Surgeon: Lucilla Lame, MD;  Location: [place];  Service: Endoscopy;;  . TONSILLECTOMY      Social History   Tobacco Use  .  Smoking status: Never Smoker  . Smokeless tobacco: Never Used  . Tobacco comment: smoking cessation materials not required  Substance Use Topics  . Alcohol use: No  .  Drug use: No     Medication list has been reviewed and updated.  Current Meds  Medication Sig  . acetaminophen (TYLENOL) 500 MG tablet Take 2 tablets (1,000 mg total) by mouth 2 (two) times daily. (Patient taking differently: Take 1,000 mg by mouth every 6 (six) hours as needed. )  . amLODipine (NORVASC) 10 MG tablet TAKE 1 TABLET BY MOUTH DAILY  . aspirin 325 MG tablet Take 325 mg by mouth daily.  Marland Kitchen atorvastatin (LIPITOR) 40 MG tablet TAKE 1 TABLET(40 MG) BY MOUTH DAILY  . cholecalciferol (VITAMIN D3) 25 MCG (1000 UT) tablet Take 1,000 Units by mouth daily.  . ferrous sulfate (FEROSUL) 325 (65 FE) MG tablet TAKE 1 TABLET BY MOUTH TWICE DAILY WITH A MEAL  . lisinopril-hydrochlorothiazide (ZESTORETIC) 20-12.5 MG tablet TAKE 2 TABLETS BY MOUTH DAILY  . Multiple Vitamins-Calcium (ONE-A-DAY WOMENS PO) Take 1 tablet by mouth daily.  . pantoprazole (PROTONIX) 40 MG tablet TAKE 1 TABLET BY MOUTH DAILY  . sertraline (ZOLOFT) 50 MG tablet TAKE 1 TABLET(50 MG) BY MOUTH DAILY  . vitamin C (ASCORBIC ACID) 500 MG tablet Take 1 tablet (500 mg total) by mouth daily.    PHQ 2/9 Scores 03/20/2019 07/19/2018 01/17/2018 10/24/2017  PHQ - 2 Score 4 2 1  0  PHQ- 9 Score 4 6 - 2    BP Readings from Last 3 Encounters:  03/20/19 112/64  07/19/18 112/72  07/19/18 132/64    Physical Exam Vitals signs and nursing note reviewed.  Constitutional:      General: She is not in acute distress.    Appearance: She is well-developed.  HENT:     Head: Normocephalic and atraumatic.  Neck:     Musculoskeletal: Normal range of motion.     Vascular: No carotid bruit.  Cardiovascular:     Rate and Rhythm: Normal rate and regular rhythm.     Heart sounds: No murmur.  Pulmonary:     Effort: Pulmonary effort is normal. No respiratory distress.     Breath sounds: No wheezing or rhonchi.  Musculoskeletal: Normal range of motion.     Right lower leg: No edema.     Left lower leg: No edema.     Comments: 2 cysts on  medial side of right wrist - minimally tender  Moderate thoracic kyphosis present  Lymphadenopathy:     Cervical: No cervical adenopathy.  Skin:    General: Skin is warm and dry.     Findings: No rash.  Neurological:     Mental Status: She is alert and oriented to person, place, and time.  Psychiatric:        Attention and Perception: Attention normal.        Mood and Affect: Mood normal.        Behavior: Behavior normal.        Thought Content: Thought content normal.     Wt Readings from Last 3 Encounters:  03/20/19 113 lb (51.3 kg)  07/19/18 110 lb (49.9 kg)  07/19/18 110 lb 12.8 oz (50.3 kg)    BP 112/64   Pulse 75   Ht 5' (1.524 m)   Wt 113 lb (51.3 kg)   SpO2 96%   BMI 22.07 kg/m   Assessment and Plan: 1. Essential hypertension Clinically stable exam with well controlled BP.  Tolerating medications without side effects at this time. Pt to continue current regimen and low sodium diet; benefits of regular exercise as able discussed. - amLODipine (NORVASC) 10 MG tablet; Take 1 tablet (10 mg total) by mouth daily.  Dispense: 90 tablet; Refill: 1 - lisinopril-hydrochlorothiazide (ZESTORETIC) 20-12.5 MG tablet; Take 2 tablets by mouth daily.  Dispense: 180 tablet; Refill: 1  2. Hyperlipidemia, unspecified hyperlipidemia type Tolerating statin medication without side effects at this time Continue same therapy without change at this time. - atorvastatin (LIPITOR) 40 MG tablet; TAKE 1 TABLET(40 MG) BY MOUTH DAILY  Dispense: 90 tablet; Refill: 1  3. Ganglion cyst of wrist, right Pt reassured; can refer to Ortho for surgical intervention if needed   Partially dictated using Editor, commissioning. Any errors are unintentional.  Halina Maidens, MD [facility]  03/20/2019

## 2019-03-25 NOTE — Progress Notes (Signed)
[name]  [address] [place], [place] [address] Phone: [phone]  Fax: [phone]   Clinic Day:  03/28/2019  Referring physician: Glean Hess, MD  Chief Complaint: [place] is a 83 y.o. female with mild thrombocytopenia and iron deficiency anemia who is seen for 6 month assessment.   HPI: The patient was last seen in the hematology clinic as a new patient on 09/27/2018 via telemedicine. At that time, she was doing well. Reflux was well managed. She denied any melena, hematochezia, hematuria or vaginal bleeding. Hemoglobin 11.4. MCV 100.9. Ferritin 88 (goal 100) with a iron saturation of 14% and a TIBC of 292. She continued oral iron with OJ or vitamin C.   She was seen by Dr. Halina Maidens on 03/20/2019. Patient had mild right wrist burning pain. She had shortness of breath. She had hip pain related to arthralgias and back pain. Patient continued current medication regimen.  She was reassured about a ganglion cyst of the right wrist.  Dr. Army Melia offered referral for surgical intervention if needed.   During the interim, she has felt pretty good. She has been taking oral iron daily with OJ or vitamin C. She notes her stools are mildly dark. She is having headaches which she attributes to cataracts. She has no energy and is worried she may fall. She has shortness of breath on exertion. She denies any leg swelling. She has bilateral ankle edema and leg cramps. She has no restless legs. She has no bruising or bleeding. She has back pain secondary to scoliosis. She notes a good mood today.   Her diet is good. She is eating meat 1-2 times a week. She is eating a lot of green leafy vegetables. She denies ice pica. She denies any other cravings. Her heartburn has improved.    Her BP is 157/65 in the clinic today.    Past Medical History:  Diagnosis Date  . Anemia   . Cataract   . Depression   . Fall   . GERD (gastroesophageal reflux  disease)   . Hyperlipidemia   . Hypertension   . Myocardial infarction [facility])    "mild" many yrs ago    Past Surgical History:  Procedure Laterality Date  . ABDOMINAL HYSTERECTOMY    . COLONOSCOPY WITH PROPOFOL N/A 12/05/2017   Procedure: COLONOSCOPY WITH PROPOFOL;  Surgeon: Lucilla Lame, MD;  Location: [facility];  Service: Endoscopy;  Laterality: N/A;  . ESOPHAGOGASTRODUODENOSCOPY (EGD) WITH PROPOFOL N/A 12/05/2017   Procedure: ESOPHAGOGASTRODUODENOSCOPY (EGD) WITH PROPOFOL;  Surgeon: Lucilla Lame, MD;  Location: [facility];  Service: Endoscopy;  Laterality: N/A;  . POLYPECTOMY  12/05/2017   Procedure: POLYPECTOMY;  Surgeon: Lucilla Lame, MD;  Location: [facility];  Service: Endoscopy;;  . TONSILLECTOMY      Family History  Problem Relation Age of Onset  . Heart disease Mother   . Brain cancer Mother   . Heart attack Father   . Liver disease Daughter   . Throat cancer Brother   . Cancer Maternal Grandfather        Unsure type    Social History:  reports that she has never smoked. She has never used smokeless tobacco. She reports that she does not drink alcohol or use drugs. She is originally from [place]. She lives with her granddaughter Lawerance Bach.  She lives in [place].  The patient is alone today.  Allergies: No Known Allergies  Current Medications: Current Outpatient Medications  Medication Sig  Dispense Refill  . acetaminophen (TYLENOL) 500 MG tablet Take 2 tablets (1,000 mg total) by mouth 2 (two) times daily. 30 tablet   . amLODipine (NORVASC) 10 MG tablet Take 1 tablet (10 mg total) by mouth daily. 90 tablet 1  . aspirin 325 MG tablet Take 325 mg by mouth daily.    Marland Kitchen atorvastatin (LIPITOR) 40 MG tablet TAKE 1 TABLET(40 MG) BY MOUTH DAILY 90 tablet 1  . cholecalciferol (VITAMIN D3) 25 MCG (1000 UT) tablet Take 1,000 Units by mouth daily.    . ferrous sulfate (FEROSUL) 325 (65 FE) MG tablet TAKE 1 TABLET BY MOUTH TWICE DAILY WITH A MEAL 60  tablet 0  . lisinopril-hydrochlorothiazide (ZESTORETIC) 20-12.5 MG tablet Take 2 tablets by mouth daily. 180 tablet 1  . Multiple Vitamins-Calcium (ONE-A-DAY WOMENS PO) Take 1 tablet by mouth daily.    . pantoprazole (PROTONIX) 40 MG tablet TAKE 1 TABLET BY MOUTH DAILY 90 tablet 2  . sertraline (ZOLOFT) 50 MG tablet TAKE 1 TABLET(50 MG) BY MOUTH DAILY 30 tablet 5  . vitamin C (ASCORBIC ACID) 500 MG tablet Take 1 tablet (500 mg total) by mouth daily. 30 tablet 2  . ENSURE (ENSURE) Take 237 mLs by mouth 2 (two) times daily between meals. 237 mL 12   No current facility-administered medications for this visit.     Review of Systems  Constitutional: Positive for malaise/fatigue. Negative for chills, diaphoresis, fever and weight loss (up 3 pounds; baseline 115 pounds).       Feels "pretty good".  Low energy.  HENT: Negative for congestion, ear pain, hearing loss, nosebleeds and sore throat.   Eyes: Negative for double vision, photophobia and pain.       Cataracts.  Respiratory: Positive for shortness of breath (exertional). Negative for cough and wheezing.   Cardiovascular: Positive for leg swelling (bilateral ankles). Negative for chest pain, palpitations and PND.  Gastrointestinal: Positive for melena (on oral iron). Negative for abdominal pain, blood in stool, constipation, diarrhea, heartburn (improving), nausea and vomiting.       Eating well.  Genitourinary: Negative.  Negative for dysuria, frequency and urgency.  Musculoskeletal: Positive for back pain (scoliosis). Negative for joint pain and myalgias.       Leg cramps.  Skin: Negative.  Negative for rash.  Neurological: Positive for headaches. Negative for dizziness, tingling, sensory change and weakness.  Endo/Heme/Allergies: Negative.  Does not bruise/bleed easily.  Psychiatric/Behavioral: Negative.  Negative for depression (good) and memory loss. The patient is not nervous/anxious and does not have insomnia.   All other systems  reviewed and are negative.  Performance status (ECOG): 1-2  Vitals Blood pressure (!) 157/65, pulse 71, temperature 97.6 F (36.4 C), temperature source Tympanic, resp. rate 18, height 5' (1.524 m), weight 113 lb 12.1 oz (51.6 kg), SpO2 100 %.   Physical Exam  Constitutional: She is oriented to person, place, and time. She appears well-developed and well-nourished. No distress.  Elderly woman sitting comfortably in the exam room in no acute distress.  She has a cane at her side.  HENT:  Head: Normocephalic and atraumatic.  Mouth/Throat: Oropharynx is clear and moist. No oropharyngeal exudate.  Gray hair.  No upper teeth.  Mask.  Eyes: Pupils are equal, round, and reactive to light. Conjunctivae and EOM are normal. No scleral icterus.  Glasses.  Neck: Normal range of motion. Neck supple. No JVD present.  Cardiovascular: Normal rate, regular rhythm and normal heart sounds.  No murmur heard. Pulmonary/Chest: Effort normal and  breath sounds normal. No respiratory distress. She has no wheezes. She has no rales.  Abdominal: Soft. Bowel sounds are normal. She exhibits no distension and no mass. There is no abdominal tenderness. There is no rebound and no guarding.  Musculoskeletal: Normal range of motion.        General: Edema (1+ bilateral ankles; above socks) present. No tenderness.     Comments: Kyphosis.  Scoliosis.  Lymphadenopathy:       Head (right side): No preauricular, no posterior auricular and no occipital adenopathy present.       Head (left side): No preauricular, no posterior auricular and no occipital adenopathy present.    She has no cervical adenopathy.    She has no axillary adenopathy.       Right: No inguinal and no supraclavicular adenopathy present.       Left: No inguinal and no supraclavicular adenopathy present.  Neurological: She is alert and oriented to person, place, and time.  Skin: Skin is warm and dry. No rash noted. She is not diaphoretic. No erythema. No  pallor.  Psychiatric: She has a normal mood and affect. Her behavior is normal. Judgment and thought content normal.  Nursing note and vitals reviewed.   Appointment on 03/28/2019  Component Date Value Ref Range Status  . WBC 03/28/2019 3.6* 4.0 - 10.5 K/uL Final  . RBC 03/28/2019 3.47* 3.87 - 5.11 MIL/uL Final  . Hemoglobin 03/28/2019 11.5* 12.0 - 15.0 g/dL Final  . HCT 03/28/2019 34.4* 36.0 - 46.0 % Final  . MCV 03/28/2019 99.1  80.0 - 100.0 fL Final  . MCH 03/28/2019 33.1  26.0 - 34.0 pg Final  . MCHC 03/28/2019 33.4  30.0 - 36.0 g/dL Final  . RDW 03/28/2019 12.4  11.5 - 15.5 % Final  . Platelets 03/28/2019 154  150 - 400 K/uL Final  . nRBC 03/28/2019 0.0  0.0 - 0.2 % Final  . Neutrophils Relative % 03/28/2019 65  % Final  . Neutro Abs 03/28/2019 2.3  1.7 - 7.7 K/uL Final  . Lymphocytes Relative 03/28/2019 25  % Final  . Lymphs Abs 03/28/2019 0.9  0.7 - 4.0 K/uL Final  . Monocytes Relative 03/28/2019 8  % Final  . Monocytes Absolute 03/28/2019 0.3  0.1 - 1.0 K/uL Final  . Eosinophils Relative 03/28/2019 2  % Final  . Eosinophils Absolute 03/28/2019 0.1  0.0 - 0.5 K/uL Final  . Basophils Relative 03/28/2019 0  % Final  . Basophils Absolute 03/28/2019 0.0  0.0 - 0.1 K/uL Final  . Immature Granulocytes 03/28/2019 0  % Final  . Abs Immature Granulocytes 03/28/2019 0.01  0.00 - 0.07 K/uL Final   Performed at [facility], [address] [place], [place] [address]    Assessment:  [place] is a 83 y.o. female with mild thrombocytopenia and iron deficiency anemia.  Platelet count has ranged between 138,000 - 158,000.  Diet is good.  She is on oral iron.  Work-up on 10/06/2017 revealed a hematocrit of 32.8, hemoglobin 10.9, MCV 93.9, WBC 4,300 (ANC 2,900), and platelets 152,000.  Ferritin was 29 with an iron saturation 10% and TIBC 354.  Normal studies included:  B12 (744), folate (45), acute hepatitis panel, HIV testing, LDH 160, SPEP, and flow cytometry.    Peripheral smear was unremarkable.  Urinalysis on 10/24/2017 revealed no hematuria.  TSH was normal on 09/29/2017.  Abdominal ultrasound on 10/11/2017 revealed a normal spleen.   EGD on 12/05/2017 revealed a 10 cm  hiatal hernia and grade C reflux esophagitis. Examined duodenum was normal.  Colonoscopy on 12/05/2017 revealed three 1-2 mm polyps in the distal sigmoid colon. There were non-bleeding internal hemorrhoids. Pathology revealed hyperplastic polyps and small bowel mucosa with no significant pathologic changes.   Ferritin has been followed: 29 on 10/06/2017, 34 on 01/18/2018, 44 on 04/20/2018, 88 on 09/21/2018, and 90 on 03/28/2019.   She has chronic kidney disease. Creatinine was (CrCl 36 ml/min) on 09/29/2017.  Symptomatically, she feels "pretty good".  Energy level remains low.  Exam is stable.  Plan: 1.   Labs today: CBC with diff, CMP, platelet count in blue top tube, ferritin, PTT, ANA, hepatitis B core antibody total. 2.   Iron deficiency anemia             Hematocrit 34.3.  Hemoglobin 11.5.  MCV 99.1.   Ferritin 90.  Diet appears good.  She denies any bleeding.             EGD and colonoscopy in 11/2017 revealed reflux only.             She is taking ferrous sulfate 325 mg/day.  Ferritin goal 100. 3.   Thrombocytopenia, resolved             Platelets 154,000 (normal).  Prior work-up was negative.  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             Additional labs drawn today.                           Platelet count in a blue top tube 136,400 to r/o pseudothrombocytopenia.                         PTT and hepatitis B testing were negative.   ANA was + today with 1.3 ribonucleic protein (0-0.9).    Significance unclear. 4.   RTC in 1 year for MD assessment and labs (CBC with diff, ferritin).  I discussed the assessment and treatment plan with the patient.  The patient was provided an opportunity to ask questions and all were answered.  The patient agreed with the  plan and demonstrated an understanding of the instructions.  The patient was advised to call back if the symptoms worsen or if the condition fails to improve as anticipated.  I provided 16 minutes of face-to-face time during this this encounter and > 50% was spent counseling as documented under my assessment and plan.    Lequita Asal, MD, PhD    03/28/2019, 11:32 AM  I, Selena Batten, am acting as scribe for [employer]. Mike Gip, MD, PhD.  I, Melissa C. Mike Gip, MD, have reviewed the above documentation for accuracy and completeness, and I agree with the above.

## 2019-03-28 ENCOUNTER — Inpatient Hospital Stay: Payer: Medicare Other | Attending: Hematology and Oncology | Admitting: Hematology and Oncology

## 2019-03-28 ENCOUNTER — Encounter: Payer: Self-pay | Admitting: Hematology and Oncology

## 2019-03-28 ENCOUNTER — Other Ambulatory Visit: Payer: Self-pay | Admitting: Hematology and Oncology

## 2019-03-28 ENCOUNTER — Inpatient Hospital Stay: Payer: Medicare Other

## 2019-03-28 ENCOUNTER — Other Ambulatory Visit: Payer: Self-pay

## 2019-03-28 VITALS — BP 157/65 | HR 71 | Temp 97.6°F | Resp 18 | Ht 60.0 in | Wt 113.8 lb

## 2019-03-28 DIAGNOSIS — R0609 Other forms of dyspnea: Secondary | ICD-10-CM | POA: Insufficient documentation

## 2019-03-28 DIAGNOSIS — M419 Scoliosis, unspecified: Secondary | ICD-10-CM | POA: Insufficient documentation

## 2019-03-28 DIAGNOSIS — D696 Thrombocytopenia, unspecified: Secondary | ICD-10-CM

## 2019-03-28 DIAGNOSIS — D509 Iron deficiency anemia, unspecified: Secondary | ICD-10-CM | POA: Insufficient documentation

## 2019-03-28 DIAGNOSIS — R600 Localized edema: Secondary | ICD-10-CM | POA: Diagnosis not present

## 2019-03-28 DIAGNOSIS — Z79899 Other long term (current) drug therapy: Secondary | ICD-10-CM | POA: Diagnosis not present

## 2019-03-28 DIAGNOSIS — R519 Headache, unspecified: Secondary | ICD-10-CM | POA: Insufficient documentation

## 2019-03-28 LAB — CBC WITH DIFFERENTIAL/PLATELET
Abs Immature Granulocytes: 0.01 10*3/uL (ref 0.00–0.07)
Basophils Absolute: 0 10*3/uL (ref 0.0–0.1)
Basophils Relative: 0 %
Eosinophils Absolute: 0.1 10*3/uL (ref 0.0–0.5)
Eosinophils Relative: 2 %
HCT: 34.4 % — ABNORMAL LOW (ref 36.0–46.0)
Hemoglobin: 11.5 g/dL — ABNORMAL LOW (ref 12.0–15.0)
Immature Granulocytes: 0 %
Lymphocytes Relative: 25 %
Lymphs Abs: 0.9 10*3/uL (ref 0.7–4.0)
MCH: 33.1 pg (ref 26.0–34.0)
MCHC: 33.4 g/dL (ref 30.0–36.0)
MCV: 99.1 fL (ref 80.0–100.0)
Monocytes Absolute: 0.3 10*3/uL (ref 0.1–1.0)
Monocytes Relative: 8 %
Neutro Abs: 2.3 10*3/uL (ref 1.7–7.7)
Neutrophils Relative %: 65 %
Platelets: 154 10*3/uL (ref 150–400)
RBC: 3.47 MIL/uL — ABNORMAL LOW (ref 3.87–5.11)
RDW: 12.4 % (ref 11.5–15.5)
WBC: 3.6 10*3/uL — ABNORMAL LOW (ref 4.0–10.5)
nRBC: 0 % (ref 0.0–0.2)

## 2019-03-28 LAB — COMPREHENSIVE METABOLIC PANEL
ALT: 21 U/L (ref 0–44)
AST: 26 U/L (ref 15–41)
Albumin: 4.2 g/dL (ref 3.5–5.0)
Alkaline Phosphatase: 68 U/L (ref 38–126)
Anion gap: 9 (ref 5–15)
BUN: 27 mg/dL — ABNORMAL HIGH (ref 8–23)
CO2: 29 mmol/L (ref 22–32)
Calcium: 9.3 mg/dL (ref 8.9–10.3)
Chloride: 95 mmol/L — ABNORMAL LOW (ref 98–111)
Creatinine, Ser: 1.33 mg/dL — ABNORMAL HIGH (ref 0.44–1.00)
GFR calc Af Amer: 43 mL/min — ABNORMAL LOW (ref >60)
GFR calc non Af Amer: 37 mL/min — ABNORMAL LOW (ref >60)
Glucose, Bld: 106 mg/dL — ABNORMAL HIGH (ref 70–99)
Potassium: 4.2 mmol/L (ref 3.5–5.1)
Sodium: 133 mmol/L — ABNORMAL LOW (ref 135–145)
Total Bilirubin: 0.6 mg/dL (ref 0.3–1.2)
Total Protein: 7.4 g/dL (ref 6.5–8.1)

## 2019-03-28 LAB — APTT: aPTT: 30 seconds (ref 24–36)

## 2019-03-28 LAB — HEPATITIS B CORE ANTIBODY, TOTAL: Hep B Core Total Ab: NONREACTIVE

## 2019-03-28 LAB — FERRITIN: Ferritin: 90 ng/mL (ref 11–307)

## 2019-03-28 LAB — PLATELET BY CITRATE: Platelet CT in Citrate: 136.4

## 2019-03-28 NOTE — Progress Notes (Signed)
No new changes noted today 

## 2019-03-29 LAB — ENA+DNA/DS+SJORGEN'S
ENA SM Ab Ser-aCnc: <0.2 AI (ref 0.0–0.9)
Ribonucleic Protein: 1.3 AI — ABNORMAL HIGH (ref 0.0–0.9)
SSA (Ro) (ENA) Antibody, IgG: <0.2 AI (ref 0.0–0.9)
SSB (La) (ENA) Antibody, IgG: <0.2 AI (ref 0.0–0.9)
ds DNA Ab: <1 IU/mL (ref 0–9)

## 2019-03-29 LAB — ANA W/REFLEX: Anti Nuclear Antibody (ANA): POSITIVE — AB

## 2019-04-17 ENCOUNTER — Other Ambulatory Visit: Payer: Self-pay | Admitting: Internal Medicine

## 2019-04-17 DIAGNOSIS — I10 Essential (primary) hypertension: Secondary | ICD-10-CM

## 2019-05-11 DIAGNOSIS — Z9841 Cataract extraction status, right eye: Secondary | ICD-10-CM

## 2019-05-11 HISTORY — DX: Cataract extraction status, right eye: Z98.41

## 2019-05-11 HISTORY — PX: CATARACT EXTRACTION, BILATERAL: SHX1313

## 2019-05-15 DIAGNOSIS — H25812 Combined forms of age-related cataract, left eye: Secondary | ICD-10-CM | POA: Diagnosis not present

## 2019-05-15 DIAGNOSIS — H2512 Age-related nuclear cataract, left eye: Secondary | ICD-10-CM | POA: Diagnosis not present

## 2019-05-18 ENCOUNTER — Ambulatory Visit: Payer: Medicare Other | Attending: Internal Medicine

## 2019-05-18 DIAGNOSIS — Z20822 Contact with and (suspected) exposure to covid-19: Secondary | ICD-10-CM

## 2019-05-19 LAB — NOVEL CORONAVIRUS, NAA: SARS-CoV-2, NAA: NOT DETECTED

## 2019-05-21 ENCOUNTER — Telehealth: Payer: Self-pay

## 2019-05-21 NOTE — Telephone Encounter (Signed)
Pt notified of negative COVID-19 results. Understanding verbalized.  [place]   

## 2019-05-30 DIAGNOSIS — H25011 Cortical age-related cataract, right eye: Secondary | ICD-10-CM | POA: Diagnosis not present

## 2019-05-30 DIAGNOSIS — H2511 Age-related nuclear cataract, right eye: Secondary | ICD-10-CM | POA: Diagnosis not present

## 2019-05-30 DIAGNOSIS — H35372 Puckering of macula, left eye: Secondary | ICD-10-CM | POA: Diagnosis not present

## 2019-05-30 DIAGNOSIS — H2589 Other age-related cataract: Secondary | ICD-10-CM | POA: Diagnosis not present

## 2019-06-05 ENCOUNTER — Encounter (INDEPENDENT_AMBULATORY_CARE_PROVIDER_SITE_OTHER): Payer: Medicare Other | Admitting: Ophthalmology

## 2019-06-09 ENCOUNTER — Other Ambulatory Visit: Payer: Self-pay | Admitting: Gastroenterology

## 2019-07-10 DIAGNOSIS — H2589 Other age-related cataract: Secondary | ICD-10-CM | POA: Diagnosis not present

## 2019-07-10 DIAGNOSIS — H25811 Combined forms of age-related cataract, right eye: Secondary | ICD-10-CM | POA: Diagnosis not present

## 2019-07-10 DIAGNOSIS — H2511 Age-related nuclear cataract, right eye: Secondary | ICD-10-CM | POA: Diagnosis not present

## 2019-07-10 DIAGNOSIS — H25011 Cortical age-related cataract, right eye: Secondary | ICD-10-CM | POA: Diagnosis not present

## 2019-07-23 ENCOUNTER — Ambulatory Visit: Payer: Medicare Other

## 2019-07-23 ENCOUNTER — Encounter (INDEPENDENT_AMBULATORY_CARE_PROVIDER_SITE_OTHER): Payer: Medicare Other | Admitting: Ophthalmology

## 2019-08-13 ENCOUNTER — Other Ambulatory Visit: Payer: Self-pay | Admitting: Internal Medicine

## 2019-08-13 DIAGNOSIS — F321 Major depressive disorder, single episode, moderate: Secondary | ICD-10-CM

## 2019-08-20 ENCOUNTER — Ambulatory Visit (INDEPENDENT_AMBULATORY_CARE_PROVIDER_SITE_OTHER): Payer: Medicare Other

## 2019-08-20 ENCOUNTER — Other Ambulatory Visit: Payer: Self-pay

## 2019-08-20 VITALS — BP 138/62 | HR 96 | Resp 16 | Ht 60.0 in | Wt 113.8 lb

## 2019-08-20 DIAGNOSIS — Z0000 Encounter for general adult medical examination without abnormal findings: Secondary | ICD-10-CM

## 2019-08-20 NOTE — Patient Instructions (Signed)
[place] , Thank you for taking time to come for your Medicare Wellness Visit. I appreciate your ongoing commitment to your health goals. Please review the following plan we discussed and let me know if I can assist you in the future.   Screening recommendations/referrals: Colonoscopy: done 12/05/17 Mammogram: no longer required Bone Density: postponed Recommended yearly ophthalmology/optometry visit for glaucoma screening and checkup Recommended yearly dental visit for hygiene and checkup  Vaccinations: Influenza vaccine: done 03/20/19 Pneumococcal vaccine: done 01/17/18 Tdap vaccine: done 06/07/16 Shingles vaccine: Shingrix discussed. Please contact your pharmacy for coverage information.   Advanced directives: Advance directive discussed with you today. Even though you declined this today please call our office should you change your mind and we can give you the proper paperwork for you to fill out.  Conditions/risks identified: Recommend drinking 6-8 glasses of water per day  Next appointment: Please follow up in one year for your Medicare Annual Wellness visit.     Preventive Care 26 Years and Older, Female Preventive care refers to lifestyle choices and visits with your health care provider that can promote health and wellness. What does preventive care include?  A yearly physical exam. This is also called an annual well check.  Dental exams once or twice a year.  Routine eye exams. Ask your health care provider how often you should have your eyes checked.  Personal lifestyle choices, including:  Daily care of your teeth and gums.  Regular physical activity.  Eating a healthy diet.  Avoiding tobacco and drug use.  Limiting alcohol use.  Practicing safe sex.  Taking low-dose aspirin every day.  Taking vitamin and mineral supplements as recommended by your health care provider. What happens during an annual well check? The services and screenings done by your  health care provider during your annual well check will depend on your age, overall health, lifestyle risk factors, and family history of disease. Counseling  Your health care provider may ask you questions about your:  Alcohol use.  Tobacco use.  Drug use.  Emotional well-being.  Home and relationship well-being.  Sexual activity.  Eating habits.  History of falls.  Memory and ability to understand (cognition).  Work and work Statistician.  Reproductive health. Screening  You may have the following tests or measurements:  Height, weight, and BMI.  Blood pressure.  Lipid and cholesterol levels. These may be checked every 5 years, or more frequently if you are over 32 years old.  Skin check.  Lung cancer screening. You may have this screening every year starting at age 67 if you have a 30-pack-year history of smoking and currently smoke or have quit within the past 15 years.  Fecal occult blood test (FOBT) of the stool. You may have this test every year starting at age 67.  Flexible sigmoidoscopy or colonoscopy. You may have a sigmoidoscopy every 5 years or a colonoscopy every 10 years starting at age 47.  Hepatitis C blood test.  Hepatitis B blood test.  Sexually transmitted disease (STD) testing.  Diabetes screening. This is done by checking your blood sugar (glucose) after you have not eaten for a while (fasting). You may have this done every 1-3 years.  Bone density scan. This is done to screen for osteoporosis. You may have this done starting at age 59.  Mammogram. This may be done every 1-2 years. Talk to your health care provider about how often you should have regular mammograms. Talk with your health care provider about your test  results, treatment options, and if necessary, the need for more tests. Vaccines  Your health care provider may recommend certain vaccines, such as:  Influenza vaccine. This is recommended every year.  Tetanus, diphtheria, and  acellular pertussis (Tdap, Td) vaccine. You may need a Td booster every 10 years.  Zoster vaccine. You may need this after age 63.  Pneumococcal 13-valent conjugate (PCV13) vaccine. One dose is recommended after age 41.  Pneumococcal polysaccharide (PPSV23) vaccine. One dose is recommended after age 56. Talk to your health care provider about which screenings and vaccines you need and how often you need them. This information is not intended to replace advice given to you by your health care provider. Make sure you discuss any questions you have with your health care provider. Document Released: 05/23/2015 Document Revised: 01/14/2016 Document Reviewed: 02/25/2015 Elsevier Interactive Patient Education  2017 [employer] Prevention in the Home Falls can cause injuries. They can happen to people of all ages. There are many things you can do to make your home safe and to help prevent falls. What can I do on the outside of my home?  Regularly fix the edges of walkways and driveways and fix any cracks.  Remove anything that might make you trip as you walk through a door, such as a raised step or threshold.  Trim any bushes or trees on the path to your home.  Use bright outdoor lighting.  Clear any walking paths of anything that might make someone trip, such as rocks or tools.  Regularly check to see if handrails are loose or broken. Make sure that both sides of any steps have handrails.  Any raised decks and porches should have guardrails on the edges.  Have any leaves, snow, or ice cleared regularly.  Use sand or salt on walking paths during winter.  Clean up any spills in your garage right away. This includes oil or grease spills. What can I do in the bathroom?  Use night lights.  Install grab bars by the toilet and in the tub and shower. Do not use towel bars as grab bars.  Use non-skid mats or decals in the tub or shower.  If you need to sit down in the shower, use  a plastic, non-slip stool.  Keep the floor dry. Clean up any water that spills on the floor as soon as it happens.  Remove soap buildup in the tub or shower regularly.  Attach bath mats securely with double-sided non-slip rug tape.  Do not have throw rugs and other things on the floor that can make you trip. What can I do in the bedroom?  Use night lights.  Make sure that you have a light by your bed that is easy to reach.  Do not use any sheets or blankets that are too big for your bed. They should not hang down onto the floor.  Have a firm chair that has side arms. You can use this for support while you get dressed.  Do not have throw rugs and other things on the floor that can make you trip. What can I do in the kitchen?  Clean up any spills right away.  Avoid walking on wet floors.  Keep items that you use a lot in easy-to-reach places.  If you need to reach something above you, use a strong step stool that has a grab bar.  Keep electrical cords out of the way.  Do not use floor polish or wax that makes  floors slippery. If you must use wax, use non-skid floor wax.  Do not have throw rugs and other things on the floor that can make you trip. What can I do with my stairs?  Do not leave any items on the stairs.  Make sure that there are handrails on both sides of the stairs and use them. Fix handrails that are broken or loose. Make sure that handrails are as long as the stairways.  Check any carpeting to make sure that it is firmly attached to the stairs. Fix any carpet that is loose or worn.  Avoid having throw rugs at the top or bottom of the stairs. If you do have throw rugs, attach them to the floor with carpet tape.  Make sure that you have a light switch at the top of the stairs and the bottom of the stairs. If you do not have them, ask someone to add them for you. What else can I do to help prevent falls?  Wear shoes that:  Do not have high heels.  Have  rubber bottoms.  Are comfortable and fit you well.  Are closed at the toe. Do not wear sandals.  If you use a stepladder:  Make sure that it is fully opened. Do not climb a closed stepladder.  Make sure that both sides of the stepladder are locked into place.  Ask someone to hold it for you, if possible.  Clearly mark and make sure that you can see:  Any grab bars or handrails.  First and last steps.  Where the edge of each step is.  Use tools that help you move around (mobility aids) if they are needed. These include:  Canes.  Walkers.  Scooters.  Crutches.  Turn on the lights when you go into a dark area. Replace any light bulbs as soon as they burn out.  Set up your furniture so you have a clear path. Avoid moving your furniture around.  If any of your floors are uneven, fix them.  If there are any pets around you, be aware of where they are.  Review your medicines with your doctor. Some medicines can make you feel dizzy. This can increase your chance of falling. Ask your doctor what other things that you can do to help prevent falls. This information is not intended to replace advice given to you by your health care provider. Make sure you discuss any questions you have with your health care provider. Document Released: 02/20/2009 Document Revised: 10/02/2015 Document Reviewed: 05/31/2014 Elsevier Interactive Patient Education  2017 [employer].

## 2019-08-20 NOTE — Progress Notes (Signed)
Subjective:   Vanessa Farmer is a 84 y.o. female who presents for Medicare Annual (Subsequent) preventive examination.  Review of Systems:   Cardiac Risk Factors include: advanced age (>66men, >67 women);hypertension;dyslipidemia     Objective:     Vitals: BP 138/62 (BP Location: Right Arm, Patient Position: Sitting, Cuff Size: Normal)   Pulse 96   Resp 16   Ht 5' (1.524 m)   Wt 113 lb 12.8 oz (51.6 kg)   SpO2 98%   BMI 22.23 kg/m   Body mass index is 22.23 kg/m.  Advanced Directives 08/20/2019 03/28/2019 09/27/2018 07/19/2018 04/20/2018 01/19/2018 12/05/2017  Does Patient Have a Medical Advance Directive? No No No No Yes Yes Yes  Type of Advance Directive - - - - - - Press photographer  Does patient want to make changes to medical advance directive? - - - - - - No - Patient declined  Copy of [place] in Chart? - - - - - - No - copy requested  Would patient like information on creating a medical advance directive? No - Patient declined No - Patient declined - Yes (MAU/Ambulatory/Procedural Areas - Information given) - - -    Tobacco Social History   Tobacco Use  Smoking Status Never Smoker  Smokeless Tobacco Never Used  Tobacco Comment   smoking cessation materials not required     Counseling given: Not Answered Comment: smoking cessation materials not required   Clinical Intake:  Pre-visit preparation completed: Yes  Pain : No/denies pain     BMI - recorded: 22.23 Nutritional Status: BMI of 19-24  Normal Nutritional Risks: None Diabetes: No  How often do you need to have someone help you when you read instructions, pamphlets, or other written materials from your doctor or pharmacy?: 1 - Never  Interpreter Needed?: No  Information entered by :: Clemetine Marker LPN  Past Medical History:  Diagnosis Date  . Anemia   . Cataract   . Depression   . Fall   . GERD (gastroesophageal reflux disease)   . Hyperlipidemia   .  Hypertension   . Myocardial infarction [facility])    "mild" many yrs ago   Past Surgical History:  Procedure Laterality Date  . ABDOMINAL HYSTERECTOMY    . CATARACT EXTRACTION, BILATERAL  2021  . COLONOSCOPY WITH PROPOFOL N/A 12/05/2017   Procedure: COLONOSCOPY WITH PROPOFOL;  Surgeon: Lucilla Lame, MD;  Location: [place];  Service: Endoscopy;  Laterality: N/A;  . ESOPHAGOGASTRODUODENOSCOPY (EGD) WITH PROPOFOL N/A 12/05/2017   Procedure: ESOPHAGOGASTRODUODENOSCOPY (EGD) WITH PROPOFOL;  Surgeon: Lucilla Lame, MD;  Location: [place];  Service: Endoscopy;  Laterality: N/A;  . POLYPECTOMY  12/05/2017   Procedure: POLYPECTOMY;  Surgeon: Lucilla Lame, MD;  [facility];  Service: Endoscopy;;  . TONSILLECTOMY     Family History  Problem Relation Age of Onset  . Heart disease Mother   . Brain cancer Mother   . Heart attack Father   . Liver disease Daughter   . Throat cancer Brother   . Cancer Maternal Grandfather        Unsure type   Social History   Socioeconomic History  . Marital status: Widowed    Spouse name: Not on file  . Number of children: 4  . Years of education: Not on file  . Highest education level: 9th grade  Occupational History  . Occupation: Retired  Tobacco Use  . Smoking status: Never Smoker  . Smokeless tobacco:  Never Used  . Tobacco comment: smoking cessation materials not required  Substance and Sexual Activity  . Alcohol use: No  . Drug use: No  . Sexual activity: Not Currently  Other Topics Concern  . Not on file  Social History Narrative   Pt lives with her son   Social Determinants of Health   Financial Resource Strain: Low Risk   . Difficulty of Paying Living Expenses: Not hard at all  Food Insecurity: No Food Insecurity  . Worried About Charity fundraiser in the Last Year: Never true  . Ran Out of Food in the Last Year: Never true  Transportation Needs: No Transportation Needs  . Lack of Transportation  (Medical): No  . Lack of Transportation (Non-Medical): No  Physical Activity: Inactive  . Days of Exercise per Week: 0 days  . Minutes of Exercise per Session: 0 min  Stress: No Stress Concern Present  . Feeling of Stress : Only a little  Social Connections: Somewhat Isolated  . Frequency of Communication with Friends and Family: More than three times a week  . Frequency of Social Gatherings with Friends and Family: Three times a week  . Attends Religious Services: More than 4 times per year  . Active Member of Clubs or Organizations: No  . Attends Archivist Meetings: Never  . Marital Status: Widowed    Outpatient Encounter Medications as of 08/20/2019  Medication Sig  . acetaminophen (TYLENOL) 500 MG tablet Take 2 tablets (1,000 mg total) by mouth 2 (two) times daily.  Marland Kitchen amLODipine (NORVASC) 10 MG tablet Take 1 tablet (10 mg total) by mouth daily.  Marland Kitchen aspirin 325 MG tablet Take 325 mg by mouth daily.  Marland Kitchen atorvastatin (LIPITOR) 40 MG tablet TAKE 1 TABLET(40 MG) BY MOUTH DAILY  . cholecalciferol (VITAMIN D3) 25 MCG (1000 UT) tablet Take 1,000 Units by mouth daily.  . ferrous sulfate (FEROSUL) 325 (65 FE) MG tablet TAKE 1 TABLET BY MOUTH TWICE DAILY WITH A MEAL  . lisinopril-hydrochlorothiazide (ZESTORETIC) 20-12.5 MG tablet TAKE 2 TABLETS BY MOUTH DAILY  . Multiple Vitamins-Calcium (ONE-A-DAY WOMENS PO) Take 1 tablet by mouth daily.  . pantoprazole (PROTONIX) 40 MG tablet Take 1 tablet (40 mg total) by mouth daily. **PLEASE SCHEDULE FOLLOW UP APPT**  . sertraline (ZOLOFT) 50 MG tablet TAKE 1 TABLET(50 MG) BY MOUTH DAILY  . vitamin C (ASCORBIC ACID) 500 MG tablet Take 1 tablet (500 mg total) by mouth daily.  Marland Kitchen ENSURE (ENSURE) Take 237 mLs by mouth 2 (two) times daily between meals.   No facility-administered encounter medications on file as of 08/20/2019.    Activities of Daily Living In your present state of health, do you have any difficulty performing the following  activities: 08/20/2019  Hearing? N  Comment declines hearing aids  Vision? Y  Difficulty concentrating or making decisions? N  Walking or climbing stairs? Y  Dressing or bathing? N  Doing errands, shopping? N  Preparing Food and eating ? N  Using the Toilet? N  In the past six months, have you accidently leaked urine? Y  Comment wears depends  Do you have problems with loss of bowel control? N  Managing your Medications? N  Managing your Finances? N  Housekeeping or managing your Housekeeping? N  Some recent data might be hidden    Patient Care Team: Glean Hess, MD as PCP - General (Internal Medicine) Lucilla Lame, MD as Consulting Physician (Gastroenterology) Lequita Asal, MD as Referring Physician (Hematology  and Oncology)    Assessment:   This is a routine wellness examination for Vanessa Farmer.  Exercise Activities and Dietary recommendations Current Exercise Habits: The patient does not participate in regular exercise at present, Exercise limited by: orthopedic condition(s)  Goals    . DIET - INCREASE WATER INTAKE     Recommend to drink at least 6-8 8oz glasses of water per day.       Fall Risk Fall Risk  08/20/2019 07/19/2018 09/29/2017 06/01/2017 10/26/2016  Falls in the past year? 0 0 No No No  Number falls in past yr: 0 0 - - -  Injury with Fall? 0 0 - - -  Risk Factor Category  - - - - -  Risk for fall due to : Impaired balance/gait - - History of fall(s);Impaired vision;Medication side effect -  Risk for fall due to: Comment - - - wears eyeglasses -  Follow up Falls prevention discussed Falls prevention discussed - - -   FALL RISK PREVENTION PERTAINING TO THE HOME:  Any stairs in or around the home? Yes  If so, do they handrails? Yes   Home free of loose throw rugs in walkways, pet beds, electrical cords, etc? Yes  Adequate lighting in your home to reduce risk of falls? Yes   ASSISTIVE DEVICES UTILIZED TO PREVENT FALLS:  Life alert? Yes Use of a  cane, walker or w/c? Yes  Grab bars in the bathroom? No  Shower chair or bench in shower? Yes  Elevated toilet seat or a handicapped toilet? No   DME ORDERS:  DME order needed?  No   TIMED UP AND GO:  Was the test performed? Yes .  Length of time to ambulate 10 feet: 6 sec.   GAIT:  Appearance of gait: Gait stead-fast and with the use of an assistive device.   Education: Fall risk prevention has been discussed.  Intervention(s) required? No   Depression Screen PHQ 2/9 Scores 08/20/2019 03/20/2019 07/19/2018 01/17/2018  PHQ - 2 Score 0 4 2 1   PHQ- 9 Score 0 4 6 -     Cognitive Function     6CIT Screen 08/20/2019 07/19/2018 06/01/2017  What Year? 0 points 0 points 0 points  What month? 0 points 0 points 3 points  What time? 0 points 0 points 0 points  Count back from 20 0 points 0 points 0 points  Months in reverse 4 points 4 points 4 points  Repeat phrase 10 points 10 points 0 points  Total Score 14 14 7     Immunization History  Administered Date(s) Administered  . Fluad Quad(high Dose 65+) 03/20/2019  . Influenza, High Dose Seasonal PF 01/31/2017  . Pneumococcal Conjugate-13 06/07/2016  . Pneumococcal Polysaccharide-23 01/17/2018  . Tdap 06/07/2016    Qualifies for Shingles Vaccine? Yes . Due for Shingrix. Education has been provided regarding the importance of this vaccine. Pt has been advised to call insurance company to determine out of pocket expense. Advised may also receive vaccine at local pharmacy or Health Dept. Verbalized acceptance and understanding.  Tdap: Up to date  Flu Vaccine: Up to date  Pneumococcal Vaccine: Up to date    Screening Tests Health Maintenance  Topic Date Due  . INFLUENZA VACCINE  12/09/2019  . TETANUS/TDAP  06/07/2026  . DEXA SCAN  Completed  . PNA vac Low Risk Adult  Completed    Cancer Screenings:  Colorectal Screening: Completed 12/05/17.  No longer required.   Mammogram:  No longer required.  Bone Density:  Completed 2014. Results reflect unavailable. Repeat every 2 years. Pt declines repeat screening at this time.  Lung Cancer Screening: (Low Dose CT Chest recommended if Age 82-80 years, 30 pack-year currently smoking OR have quit w/in 15years.) does not qualify.   Additional Screening:   Hepatitis C Screening: no longer required  Vision Screening: Recommended annual ophthalmology exams for early detection of glaucoma and other disorders of the eye. Is the patient up to date with their annual eye exam?  Yes  Who is the provider or what is the name of the office in which the pt attends annual eye exams? [place] Screening: Recommended annual dental exams for proper oral hygiene  Community Resource Referral:  CRR required this visit?  No      Plan:     I have personally reviewed and addressed the Medicare Annual Wellness questionnaire and have noted the following in the patient's chart:  A. Medical and social history B. Use of alcohol, tobacco or illicit drugs  C. Current medications and supplements D. Functional ability and status E.  Nutritional status F.  Physical activity G. Advance directives H. List of other physicians I.  Hospitalizations, surgeries, and ER visits in previous 12 months J.  [place] such as hearing and vision if needed, cognitive and depression L. Referrals and appointments   In addition, I have reviewed and discussed with patient certain preventive protocols, quality metrics, and best practice recommendations. A written personalized care plan for preventive services as well as general preventive health recommendations were provided to patient.   Signed,  Clemetine Marker, LPN Nurse Health Advisor   Nurse Notes: pt c/o feeling like her hair is shedding more with hair brush being filled more with hair. Pt has appt 09/17/19 will be due for labs at that time.

## 2019-09-17 ENCOUNTER — Ambulatory Visit: Payer: Medicare Other | Admitting: Internal Medicine

## 2019-09-26 ENCOUNTER — Encounter: Payer: Self-pay | Admitting: Internal Medicine

## 2019-09-26 ENCOUNTER — Ambulatory Visit (INDEPENDENT_AMBULATORY_CARE_PROVIDER_SITE_OTHER): Payer: Medicare Other | Admitting: Internal Medicine

## 2019-09-26 ENCOUNTER — Other Ambulatory Visit: Payer: Self-pay

## 2019-09-26 VITALS — BP 116/72 | HR 70 | Temp 98.0°F | Ht 60.0 in | Wt 113.4 lb

## 2019-09-26 DIAGNOSIS — D61818 Other pancytopenia: Secondary | ICD-10-CM

## 2019-09-26 DIAGNOSIS — F321 Major depressive disorder, single episode, moderate: Secondary | ICD-10-CM | POA: Diagnosis not present

## 2019-09-26 DIAGNOSIS — N183 Chronic kidney disease, stage 3 unspecified: Secondary | ICD-10-CM | POA: Diagnosis not present

## 2019-09-26 DIAGNOSIS — L650 Telogen effluvium: Secondary | ICD-10-CM

## 2019-09-26 DIAGNOSIS — I10 Essential (primary) hypertension: Secondary | ICD-10-CM

## 2019-09-26 DIAGNOSIS — D696 Thrombocytopenia, unspecified: Secondary | ICD-10-CM | POA: Diagnosis not present

## 2019-09-26 DIAGNOSIS — E785 Hyperlipidemia, unspecified: Secondary | ICD-10-CM | POA: Diagnosis not present

## 2019-09-26 MED ORDER — AMLODIPINE BESYLATE 10 MG PO TABS: 10.0000 mg | ORAL_TABLET | Freq: Every day | ORAL | 1 refills | Status: DC

## 2019-09-26 MED ORDER — ATORVASTATIN CALCIUM 40 MG PO TABS: ORAL_TABLET | ORAL | 1 refills | Status: DC

## 2019-09-26 MED ORDER — LISINOPRIL-HYDROCHLOROTHIAZIDE 20-12.5 MG PO TABS: 2.0000 | ORAL_TABLET | Freq: Every day | ORAL | 1 refills | Status: DC

## 2019-09-26 NOTE — Progress Notes (Signed)
Date:  09/26/2019   Name:  Vanessa Farmer   DOB:  [birth year]   MRN:  [id number]   Chief Complaint: Hypertension and Alopecia (Patient said her hair is coming out in clumps in the last Month.)  Immunization History  Administered Date(s) Administered  . Fluad Quad(high Dose 65+) 03/20/2019  . Influenza, High Dose Seasonal PF 01/31/2017  . Pneumococcal Conjugate-13 06/07/2016  . Pneumococcal Polysaccharide-23 01/17/2018  . Tdap 06/07/2016    Hypertension This is a chronic problem. The problem is unchanged. The problem is controlled (normal readings at home). Pertinent negatives include no chest pain, headaches, palpitations or shortness of breath (with exertion). Past treatments include diuretics, calcium channel blockers and ACE inhibitors. The current treatment provides significant improvement.  Hyperlipidemia Pertinent negatives include no chest pain or shortness of breath (with exertion).  Depression        This is a chronic problem.  The problem has been resolved since onset.  Associated symptoms include no fatigue, no appetite change and no headaches.  Past treatments include SSRIs - Selective serotonin reuptake inhibitors.  Compliance with treatment is good.  Previous treatment provided significant relief. Hair loss - over the past few months has noticed that her hair is coming out.  No bald spots, no itching or rash. Pancytopenia - followed by Hematology.  Counts have been stable.  She denies bleeding or bruising issues.  Lab Results  Component Value Date   CREATININE 1.33 (H) 03/28/2019   BUN 27 (H) 03/28/2019   NA 133 (L) 03/28/2019   K 4.2 03/28/2019   CL 95 (L) 03/28/2019   CO2 29 03/28/2019   Lab Results  Component Value Date   CHOL 134 07/19/2018   HDL 59 07/19/2018   LDLCALC 59 07/19/2018   TRIG 81 07/19/2018   CHOLHDL 2.3 07/19/2018   Lab Results  Component Value Date   TSH 1.259 07/19/2018   No results found for: HGBA1C Lab Results  Component Value Date    WBC 3.6 (L) 03/28/2019   HGB 11.5 (L) 03/28/2019   HCT 34.4 (L) 03/28/2019   MCV 99.1 03/28/2019   PLT 154 03/28/2019   Lab Results  Component Value Date   ALT 21 03/28/2019   AST 26 03/28/2019   ALKPHOS 68 03/28/2019   BILITOT 0.6 03/28/2019     Review of Systems  Constitutional: Negative for appetite change, fatigue, fever and unexpected weight change.  HENT: Negative for tinnitus and trouble swallowing.   Eyes: Negative for visual disturbance.  Respiratory: Negative for cough, chest tightness and shortness of breath (with exertion).   Cardiovascular: Negative for chest pain, palpitations and leg swelling (intermittent).  Gastrointestinal: Negative for abdominal pain.  Genitourinary: Positive for frequency. Negative for dysuria, hematuria and urgency.  Musculoskeletal: Negative for arthralgias.  Skin: Negative for color change and rash.  Allergic/Immunologic: Negative for environmental allergies.  Neurological: Negative for tremors, numbness and headaches.  Psychiatric/Behavioral: Positive for depression. Negative for dysphoric mood and sleep disturbance. The patient is not nervous/anxious.     Patient Active Problem List   Diagnosis Date Noted  . Ganglion cyst of wrist, right 03/20/2019  . Thrombocytopenia ([place]) 07/19/2018  . Iron deficiency anemia   . Polyp of sigmoid colon   . Pancytopenia ([place]) 09/30/2017  . GERD (gastroesophageal reflux disease) 06/10/2017  . CKD (chronic kidney disease) stage 3, GFR 30-59 ml/min 06/08/2016  . Hyperlipidemia 06/08/2016  . Scoliosis 06/07/2016  . Osteoporosis 06/07/2016  . Essential hypertension 06/07/2016  . CAD (  coronary artery disease) 06/07/2016  . Depression, major, single episode, moderate ([place]) 06/07/2016    No Known Allergies  Past Surgical History:  Procedure Laterality Date  . ABDOMINAL HYSTERECTOMY    . CATARACT EXTRACTION, BILATERAL  2021  . COLONOSCOPY WITH PROPOFOL N/A 12/05/2017   Procedure: COLONOSCOPY  WITH PROPOFOL;  Surgeon: Lucilla Lame, MD;  Location: [place];  Service: Endoscopy;  Laterality: N/A;  . ESOPHAGOGASTRODUODENOSCOPY (EGD) WITH PROPOFOL N/A 12/05/2017   Procedure: ESOPHAGOGASTRODUODENOSCOPY (EGD) WITH PROPOFOL;  Surgeon: Lucilla Lame, MD;  Location: [place];  Service: Endoscopy;  Laterality: N/A;  . POLYPECTOMY  12/05/2017   Procedure: POLYPECTOMY;  Surgeon: Lucilla Lame, MD;  Location: [place];  Service: Endoscopy;;  . TONSILLECTOMY      Social History   Tobacco Use  . Smoking status: Never Smoker  . Smokeless tobacco: Never Used  . Tobacco comment: smoking cessation materials not required  Substance Use Topics  . Alcohol use: No  . Drug use: No     Medication list has been reviewed and updated.  Current Meds  Medication Sig  . acetaminophen (TYLENOL) 500 MG tablet Take 2 tablets (1,000 mg total) by mouth 2 (two) times daily.  Marland Kitchen amLODipine (NORVASC) 10 MG tablet Take 1 tablet (10 mg total) by mouth daily.  Marland Kitchen aspirin 325 MG tablet Take 325 mg by mouth daily.  Marland Kitchen atorvastatin (LIPITOR) 40 MG tablet TAKE 1 TABLET(40 MG) BY MOUTH DAILY  . cholecalciferol (VITAMIN D3) 25 MCG (1000 UT) tablet Take 1,000 Units by mouth daily.  . ferrous sulfate (FEROSUL) 325 (65 FE) MG tablet TAKE 1 TABLET BY MOUTH TWICE DAILY WITH A MEAL  . lisinopril-hydrochlorothiazide (ZESTORETIC) 20-12.5 MG tablet TAKE 2 TABLETS BY MOUTH DAILY  . Multiple Vitamins-Calcium (ONE-A-DAY WOMENS PO) Take 1 tablet by mouth daily.  . pantoprazole (PROTONIX) 40 MG tablet Take 1 tablet (40 mg total) by mouth daily. **PLEASE SCHEDULE FOLLOW UP APPT**  . sertraline (ZOLOFT) 50 MG tablet TAKE 1 TABLET(50 MG) BY MOUTH DAILY  . vitamin C (ASCORBIC ACID) 500 MG tablet Take 1 tablet (500 mg total) by mouth daily.    PHQ 2/9 Scores 09/26/2019 08/20/2019 03/20/2019 07/19/2018  PHQ - 2 Score 2 0 4 2  PHQ- 9 Score 2 0 4 6   GAD 7 : Generalized Anxiety Score 09/26/2019  Nervous,  Anxious, on Edge 0  Control/stop worrying 0  Worry too much - different things 0  Trouble relaxing 0  Restless 0  Easily annoyed or irritable 0  Afraid - awful might happen 0  Total GAD 7 Score 0  Anxiety Difficulty Not difficult at all      BP Readings from Last 3 Encounters:  09/26/19 116/72  08/20/19 138/62  03/28/19 (!) 157/65    Physical Exam Vitals and nursing note reviewed.  Constitutional:      General: She is not in acute distress.    Appearance: She is well-developed.  HENT:     Head: Normocephalic and atraumatic.  Cardiovascular:     Rate and Rhythm: Normal rate and regular rhythm.     Heart sounds: No murmur.  Pulmonary:     Effort: Pulmonary effort is normal. No respiratory distress.     Breath sounds: No wheezing or rhonchi.  Musculoskeletal:        General: Deformity (kyphosis) present. Normal range of motion.     Cervical back: Normal range of motion.  Lymphadenopathy:     Cervical: No cervical adenopathy.  Skin:  General: Skin is warm and dry.     Capillary Refill: Capillary refill takes less than 2 seconds.     Findings: No rash.     Comments: Scalp - mildly thinning hair c/w age; no alopecia or rash  Neurological:     General: No focal deficit present.     Mental Status: She is alert and oriented to person, place, and time.  Psychiatric:        Mood and Affect: Mood normal.        Behavior: Behavior normal.     Wt Readings from Last 3 Encounters:  09/26/19 113 lb 6.4 oz (51.4 kg)  08/20/19 113 lb 12.8 oz (51.6 kg)  03/28/19 113 lb 12.1 oz (51.6 kg)    BP 116/72   Pulse 70   Temp 98 F (36.7 C) (Oral)   Ht 5' (1.524 m)   Wt 113 lb 6.4 oz (51.4 kg)   SpO2 96%   BMI 22.15 kg/m   Assessment and Plan: 1. Essential hypertension Clinically stable exam with well controlled BP on three agents. Tolerating medications without side effects at this time. Pt to continue current regimen and low sodium diet; benefits of regular exercise as  able discussed. - amLODipine (NORVASC) 10 MG tablet; Take 1 tablet (10 mg total) by mouth daily.  Dispense: 90 tablet; Refill: 1 - lisinopril-hydrochlorothiazide (ZESTORETIC) 20-12.5 MG tablet; Take 2 tablets by mouth daily.  Dispense: 180 tablet; Refill: 1 - TSH + free T4 - Comprehensive metabolic panel  2. Pancytopenia ([place]) Followed by Hematology for pancytopenia and iron deficiency  3. Depression, major, single episode, moderate ([facility]) Clinically stable on current regimen with good control of symptoms, No SI or HI. Will continue current therapy with zoloft.  4. Thrombocytopenia ([facility]) Stable counts over the past year Followed by Hematology  5. Stage 3 chronic kidney disease, unspecified whether stage 3a or 3b CKD GFR has been in the 37-40 range  6. Hyperlipidemia, unspecified hyperlipidemia type On high intensity statin therapy for dx of CAD treated medically Pt denies chest pain. She is on aspirin 325 mg daily. - atorvastatin (LIPITOR) 40 MG tablet; TAKE 1 TABLET(40 MG) BY MOUTH DAILY  Dispense: 90 tablet; Refill: 1 - Lipid panel  7. Telogen hair loss Patient is reassured - the hair loss should slow and reverse in the next few months Will check thyroid tests   Partially dictated using [place]. Any errors are unintentional.  Halina Maidens, MD [facility]  09/26/2019

## 2019-09-27 LAB — COMPREHENSIVE METABOLIC PANEL
ALT: 14 IU/L (ref 0–32)
AST: 25 IU/L (ref 0–40)
Albumin/Globulin Ratio: 1.6 (ref 1.2–2.2)
Albumin: 4 g/dL (ref 3.6–4.6)
Alkaline Phosphatase: 65 IU/L (ref 48–121)
BUN/Creatinine Ratio: 22 (ref 12–28)
BUN: 29 mg/dL — ABNORMAL HIGH (ref 8–27)
Bilirubin Total: 0.3 mg/dL (ref 0.0–1.2)
CO2: 26 mmol/L (ref 20–29)
Calcium: 9.2 mg/dL (ref 8.7–10.3)
Chloride: 98 mmol/L (ref 96–106)
Creatinine, Ser: 1.31 mg/dL — ABNORMAL HIGH (ref 0.57–1.00)
GFR calc Af Amer: 44 mL/min/{1.73_m2} — ABNORMAL LOW (ref >59)
GFR calc non Af Amer: 38 mL/min/{1.73_m2} — ABNORMAL LOW (ref >59)
Globulin, Total: 2.5 g/dL (ref 1.5–4.5)
Glucose: 94 mg/dL (ref 65–99)
Potassium: 4 mmol/L (ref 3.5–5.2)
Sodium: 139 mmol/L (ref 134–144)
Total Protein: 6.5 g/dL (ref 6.0–8.5)

## 2019-09-27 LAB — LIPID PANEL
Chol/HDL Ratio: 3.3 ratio (ref 0.0–4.4)
Cholesterol, Total: 202 mg/dL — ABNORMAL HIGH (ref 100–199)
HDL: 61 mg/dL (ref >39)
LDL Chol Calc (NIH): 127 mg/dL — ABNORMAL HIGH (ref 0–99)
Triglycerides: 79 mg/dL (ref 0–149)
VLDL Cholesterol Cal: 14 mg/dL (ref 5–40)

## 2019-09-27 LAB — TSH+FREE T4
Free T4: 1.13 ng/dL (ref 0.82–1.77)
TSH: 1.47 u[IU]/mL (ref 0.450–4.500)

## 2019-10-03 ENCOUNTER — Other Ambulatory Visit: Payer: Self-pay | Admitting: Internal Medicine

## 2019-10-03 DIAGNOSIS — I10 Essential (primary) hypertension: Secondary | ICD-10-CM

## 2019-10-12 ENCOUNTER — Other Ambulatory Visit: Payer: Self-pay | Admitting: Internal Medicine

## 2019-10-12 DIAGNOSIS — I10 Essential (primary) hypertension: Secondary | ICD-10-CM

## 2019-12-01 ENCOUNTER — Other Ambulatory Visit: Payer: Self-pay | Admitting: Gastroenterology

## 2020-01-08 ENCOUNTER — Other Ambulatory Visit: Payer: Self-pay | Admitting: Internal Medicine

## 2020-01-08 DIAGNOSIS — I10 Essential (primary) hypertension: Secondary | ICD-10-CM

## 2020-01-21 IMAGING — US US ABDOMEN COMPLETE
1 series · 13 of 25 positions shown · non-contrast
Comparison: None.

CLINICAL DATA: Neutropenia and thrombocytopenia

EXAM:
ABDOMEN ULTRASOUND COMPLETE

[Series 1: us abdomen complete · 0.17mm/px · 13 of 63 slices shown]
[im 1/63]
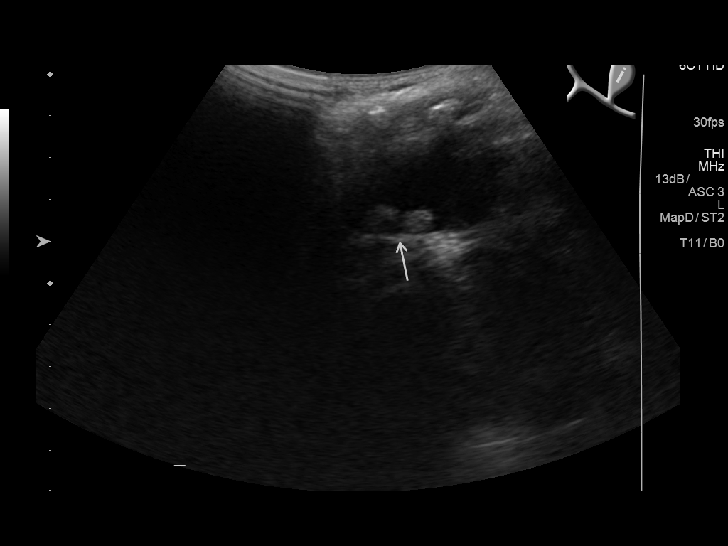
[im 6/63]
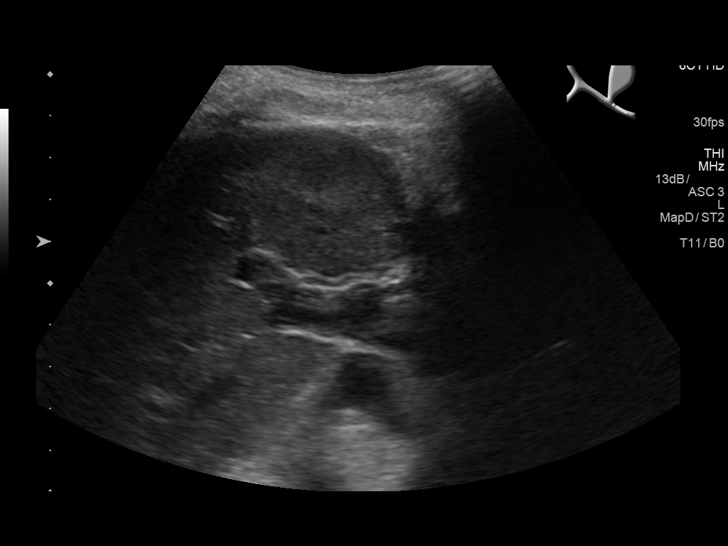
[im 11/63]
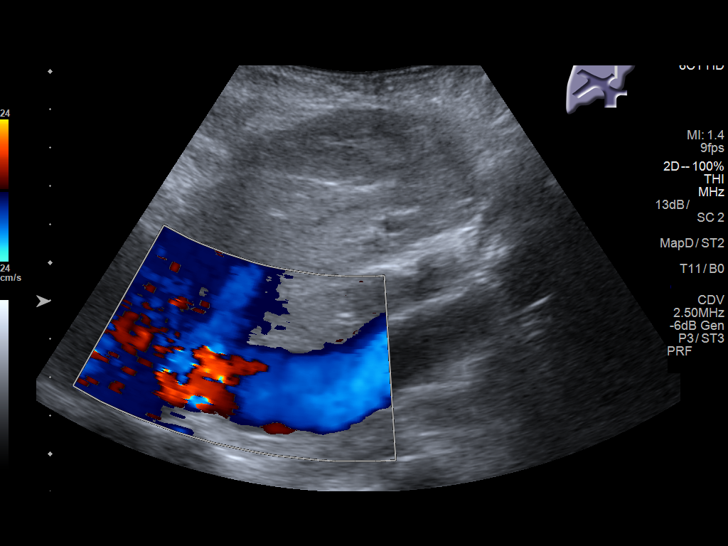
[im 16/63]
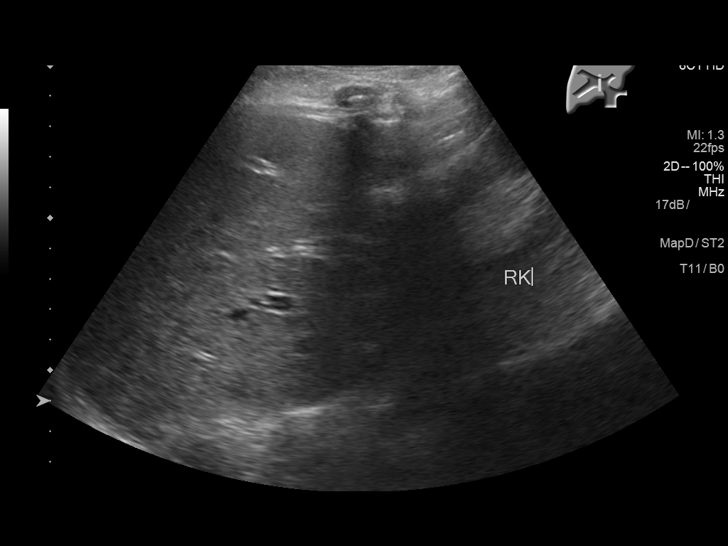
[im 21/63]
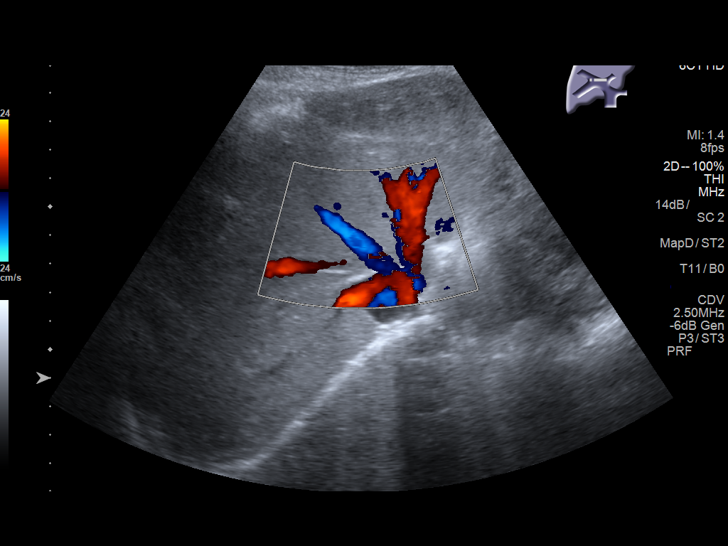
[im 26/63]
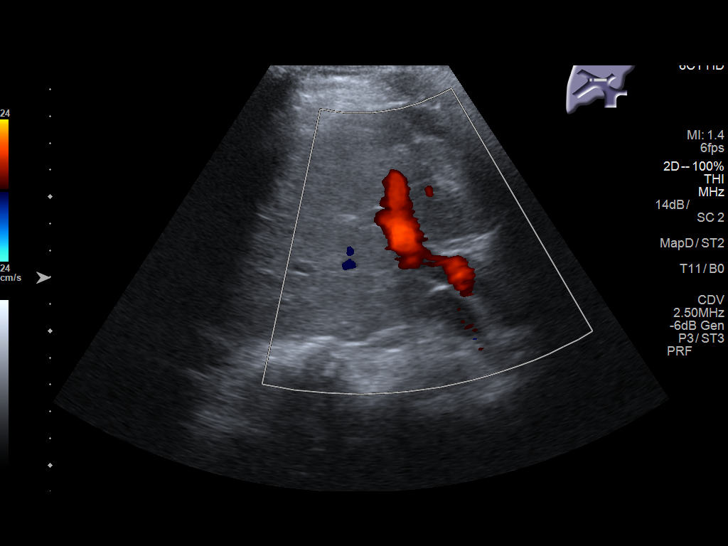
[im 32/63]
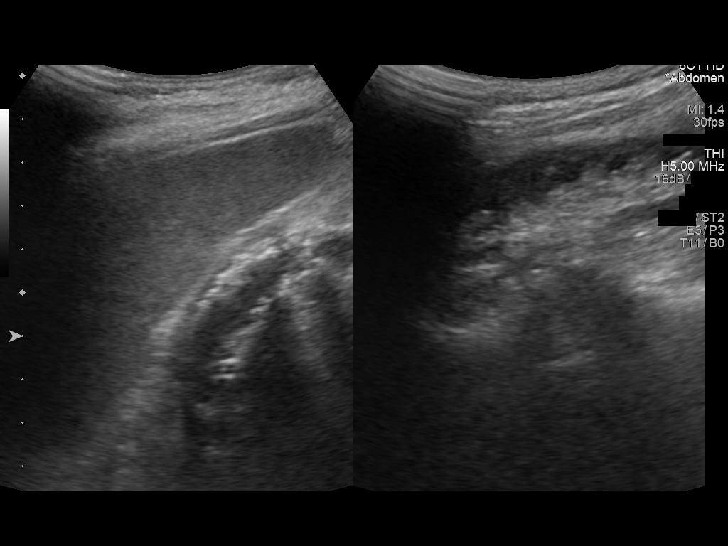
[im 37/63]
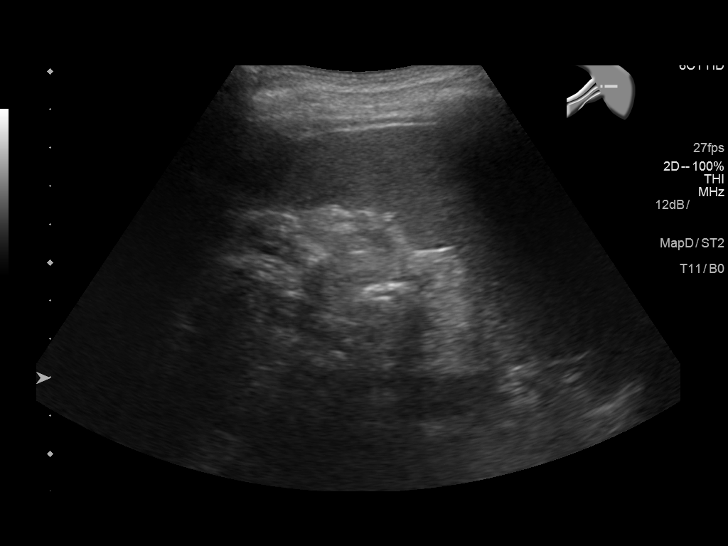
[im 42/63]
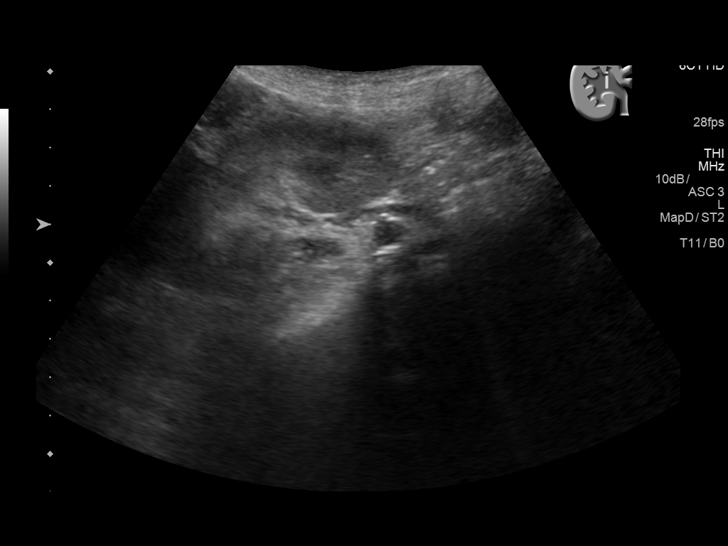
[im 47/63]
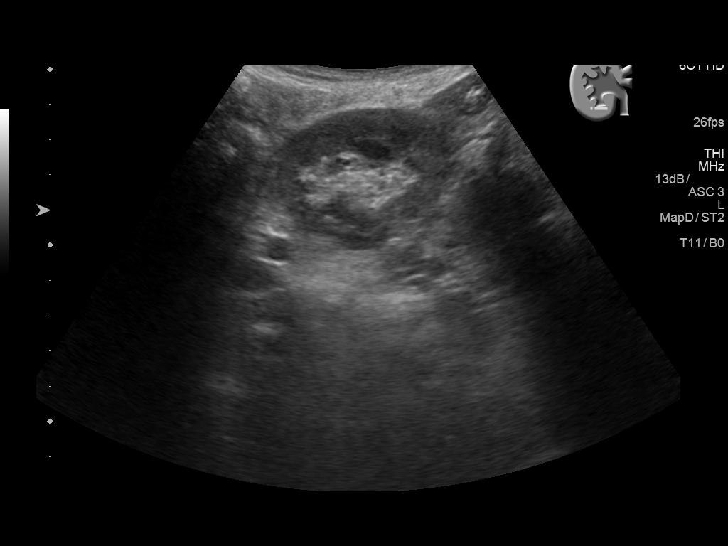
[im 52/63]
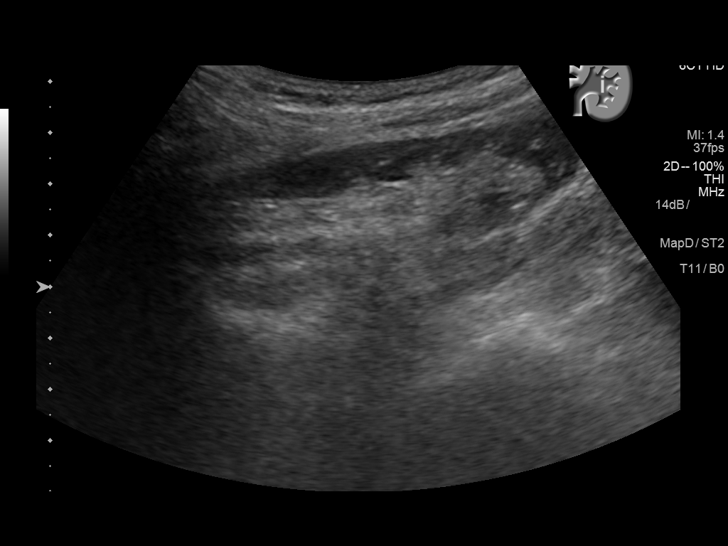
[im 57/63]
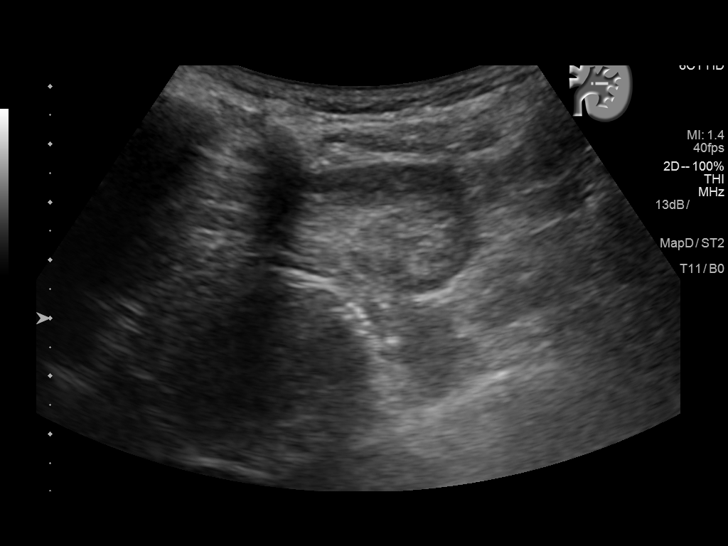
[im 63/63]
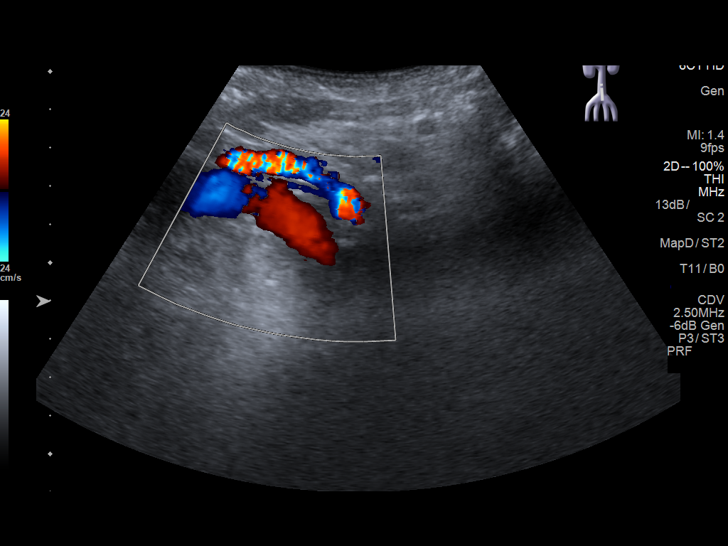

[13 of 25 positions shown; findings below may reference images not displayed]

FINDINGS: Gallbladder: Within the gallbladder, there are echogenic foci which
move but do not shadow, likely nonshadowing gallstones. Largest of
these echogenic foci measures 8 mm in length. The gallbladder wall
does not appear appreciably thickened. There is no pericholecystic
fluid. There is likely a degree of sludge intermingled with these
apparent nonshadowing gallstones.. No sonographic Murphy sign noted
by sonographer.

Common bile duct: Diameter: 2 mm. No intrahepatic, common hepatic,
or common bile duct dilatation.

Liver: No focal lesion identified. Within normal limits in
parenchymal echogenicity. Portal vein is patent on color Doppler
imaging with normal direction of blood flow towards the liver.

IVC: No abnormality visualized.

Pancreas: No pancreatic mass or inflammatory focus.

Spleen: Size and appearance within normal limits.

Right Kidney: Length: 10.0 cm. Echogenicity within normal limits. No
mass or hydronephrosis visualized.

Left Kidney: Length: 8.6 cm. Echogenicity within normal limits. No
mass or hydronephrosis visualized.

Abdominal aorta: No aneurysm visualized.

Other findings: No demonstrable ascites.
IMPRESSION: 1. Apparent nonshadowing gallstones with mild sludge in the
gallbladder. The gallbladder wall does not appear appreciably
thickened, and there is no pericholecystic fluid evident.

2. Left kidney is rather small. The significance of this finding is
uncertain. Kidneys otherwise appear normal.

3.  Spleen normal in size and contour.  No splenic lesions evident.

4.  Study otherwise unremarkable.

## 2020-03-12 ENCOUNTER — Ambulatory Visit: Payer: Medicare Other | Admitting: Internal Medicine

## 2020-03-21 ENCOUNTER — Other Ambulatory Visit: Payer: Self-pay

## 2020-03-21 ENCOUNTER — Ambulatory Visit (INDEPENDENT_AMBULATORY_CARE_PROVIDER_SITE_OTHER): Payer: Medicare Other | Admitting: Internal Medicine

## 2020-03-21 ENCOUNTER — Encounter: Payer: Self-pay | Admitting: Internal Medicine

## 2020-03-21 VITALS — BP 136/50 | HR 76 | Temp 99.1°F | Ht 60.0 in | Wt 110.0 lb

## 2020-03-21 DIAGNOSIS — N1832 Chronic kidney disease, stage 3b: Secondary | ICD-10-CM

## 2020-03-21 DIAGNOSIS — I10 Essential (primary) hypertension: Secondary | ICD-10-CM

## 2020-03-21 DIAGNOSIS — F321 Major depressive disorder, single episode, moderate: Secondary | ICD-10-CM | POA: Diagnosis not present

## 2020-03-21 DIAGNOSIS — E785 Hyperlipidemia, unspecified: Secondary | ICD-10-CM | POA: Diagnosis not present

## 2020-03-21 DIAGNOSIS — Z23 Encounter for immunization: Secondary | ICD-10-CM | POA: Diagnosis not present

## 2020-03-21 DIAGNOSIS — K219 Gastro-esophageal reflux disease without esophagitis: Secondary | ICD-10-CM

## 2020-03-21 MED ORDER — LISINOPRIL-HYDROCHLOROTHIAZIDE 20-12.5 MG PO TABS: 2.0000 | ORAL_TABLET | Freq: Every day | ORAL | 1 refills | Status: DC

## 2020-03-21 MED ORDER — ATORVASTATIN CALCIUM 40 MG PO TABS: ORAL_TABLET | ORAL | 1 refills | Status: DC

## 2020-03-21 MED ORDER — SERTRALINE HCL 50 MG PO TABS: 50.0000 mg | ORAL_TABLET | Freq: Every day | ORAL | 1 refills | Status: DC

## 2020-03-21 MED ORDER — PANTOPRAZOLE SODIUM 40 MG PO TBEC: 40.0000 mg | DELAYED_RELEASE_TABLET | Freq: Every day | ORAL | 0 refills | Status: DC

## 2020-03-21 MED ORDER — AMLODIPINE BESYLATE 10 MG PO TABS: 10.0000 mg | ORAL_TABLET | Freq: Every day | ORAL | 1 refills | Status: DC

## 2020-03-21 NOTE — Progress Notes (Signed)
Date:  03/21/2020   Name:  Vanessa Farmer   DOB:  Dec 15, 1935   MRN:  [id number]   Chief Complaint: Depression (f/u), Hypertension, and Flu Vaccine  Depression        This is a chronic problem.  The problem has been resolved since onset.  Associated symptoms include no fatigue and no headaches.  Past treatments include SSRIs - Selective serotonin reuptake inhibitors.  Compliance with treatment is good.  Previous treatment provided significant relief. Hypertension This is a chronic problem. The problem is controlled. Associated symptoms include shortness of breath. Pertinent negatives include no chest pain, headaches or palpitations. Past treatments include diuretics, calcium channel blockers and ACE inhibitors. The current treatment provides significant improvement. There are no compliance problems.   Gastroesophageal Reflux She complains of coughing. She reports no chest pain, no choking, no heartburn, no sore throat or no wheezing. This is a recurrent problem. The problem occurs rarely. Pertinent negatives include no fatigue. Risk factors: esophagitis. She has tried a PPI for the symptoms. The treatment provided significant relief. Past procedures include an EGD.    Lab Results  Component Value Date   CREATININE 1.31 (H) 09/26/2019   BUN 29 (H) 09/26/2019   NA 139 09/26/2019   K 4.0 09/26/2019   CL 98 09/26/2019   CO2 26 09/26/2019   Lab Results  Component Value Date   CHOL 202 (H) 09/26/2019   HDL 61 09/26/2019   LDLCALC 127 (H) 09/26/2019   TRIG 79 09/26/2019   CHOLHDL 3.3 09/26/2019   Lab Results  Component Value Date   TSH 1.470 09/26/2019   No results found for: HGBA1C Lab Results  Component Value Date   WBC 3.6 (L) 03/28/2019   HGB 11.5 (L) 03/28/2019   HCT 34.4 (L) 03/28/2019   MCV 99.1 03/28/2019   PLT 154 03/28/2019   Lab Results  Component Value Date   ALT 14 09/26/2019   AST 25 09/26/2019   ALKPHOS 65 09/26/2019   BILITOT 0.3 09/26/2019     Review  of Systems  Constitutional: Negative for chills, fatigue, fever and unexpected weight change.  HENT: Negative for sore throat and trouble swallowing.   Respiratory: Positive for cough and shortness of breath. Negative for choking, chest tightness and wheezing.   Cardiovascular: Positive for leg swelling. Negative for chest pain and palpitations.  Gastrointestinal: Negative for heartburn.  Neurological: Negative for dizziness, light-headedness and headaches.  Psychiatric/Behavioral: Positive for depression. Negative for dysphoric mood and sleep disturbance. The patient is not nervous/anxious.     Patient Active Problem List   Diagnosis Date Noted  . Ganglion cyst of wrist, right 03/20/2019  . Thrombocytopenia ([place]) 07/19/2018  . Iron deficiency anemia   . Polyp of sigmoid colon   . Pancytopenia ([place]) 09/30/2017  . GERD (gastroesophageal reflux disease) 06/10/2017  . CKD (chronic kidney disease) stage 3, GFR 30-59 ml/min (HCC) 06/08/2016  . Hyperlipidemia 06/08/2016  . Scoliosis 06/07/2016  . Osteoporosis 06/07/2016  . Essential hypertension 06/07/2016  . CAD (coronary artery disease) 06/07/2016  . Depression, major, single episode, moderate ([place]) 06/07/2016    No Known Allergies  Past Surgical History:  Procedure Laterality Date  . ABDOMINAL HYSTERECTOMY    . CATARACT EXTRACTION, BILATERAL  2021  . COLONOSCOPY WITH PROPOFOL N/A 12/05/2017   Procedure: COLONOSCOPY WITH PROPOFOL;  Surgeon: Lucilla Lame, MD;  Location: [facility];  Service: Endoscopy;  Laterality: N/A;  . ESOPHAGOGASTRODUODENOSCOPY (EGD) WITH PROPOFOL N/A 12/05/2017   Procedure: ESOPHAGOGASTRODUODENOSCOPY (  EGD) WITH PROPOFOL;  Surgeon: Lucilla Lame, MD;  Location: [facility];  Service: Endoscopy;  Laterality: N/A;  . POLYPECTOMY  12/05/2017   Procedure: POLYPECTOMY;  Surgeon: Lucilla Lame, MD;  Location: [facility];  Service: Endoscopy;;  . TONSILLECTOMY      Social History   Tobacco  Use  . Smoking status: Never Smoker  . Smokeless tobacco: Never Used  . Tobacco comment: smoking cessation materials not required  Vaping Use  . Vaping Use: Never used  Substance Use Topics  . Alcohol use: No  . Drug use: No     Medication list has been reviewed and updated.  Current Meds  Medication Sig  . acetaminophen (TYLENOL) 500 MG tablet Take 2 tablets (1,000 mg total) by mouth 2 (two) times daily.  Marland Kitchen amLODipine (NORVASC) 10 MG tablet TAKE 1 TABLET(10 MG) BY MOUTH DAILY  . aspirin 325 MG tablet Take 325 mg by mouth daily.  Marland Kitchen atorvastatin (LIPITOR) 40 MG tablet TAKE 1 TABLET(40 MG) BY MOUTH DAILY  . cholecalciferol (VITAMIN D3) 25 MCG (1000 UT) tablet Take 1,000 Units by mouth daily.  Marland Kitchen ENSURE (ENSURE) Take 237 mLs by mouth 2 (two) times daily between meals.  . ferrous sulfate (FEROSUL) 325 (65 FE) MG tablet TAKE 1 TABLET BY MOUTH TWICE DAILY WITH A MEAL  . lisinopril-hydrochlorothiazide (ZESTORETIC) 20-12.5 MG tablet TAKE 2 TABLETS BY MOUTH DAILY  . Multiple Vitamins-Calcium (ONE-A-DAY WOMENS PO) Take 1 tablet by mouth daily.  . pantoprazole (PROTONIX) 40 MG tablet Take 1 tablet (40 mg total) by mouth daily. **PLEASE SCHEDULE FOLLOW UP APPT**  . sertraline (ZOLOFT) 50 MG tablet TAKE 1 TABLET(50 MG) BY MOUTH DAILY  . vitamin C (ASCORBIC ACID) 500 MG tablet Take 1 tablet (500 mg total) by mouth daily.    PHQ 2/9 Scores 03/21/2020 09/26/2019 08/20/2019 03/20/2019  PHQ - 2 Score 0 2 0 4  PHQ- 9 Score 0 2 0 4    GAD 7 : Generalized Anxiety Score 03/21/2020 09/26/2019  Nervous, Anxious, on Edge 0 0  Control/stop worrying 0 0  Worry too much - different things 0 0  Trouble relaxing 0 0  Restless 0 0  Easily annoyed or irritable 0 0  Afraid - awful might happen 0 0  Total GAD 7 Score 0 0  Anxiety Difficulty - Not difficult at all    BP Readings from Last 3 Encounters:  03/21/20 (!) 136/50  09/26/19 116/72  08/20/19 138/62    Physical Exam Vitals and nursing note  reviewed.  Constitutional:      General: She is not in acute distress.    Appearance: She is well-developed.  HENT:     Head: Normocephalic and atraumatic.  Cardiovascular:     Rate and Rhythm: Normal rate and regular rhythm.     Pulses: Normal pulses.     Heart sounds: No murmur heard.   Pulmonary:     Effort: Pulmonary effort is normal. No respiratory distress.     Breath sounds: No wheezing or rhonchi.  Musculoskeletal:     Cervical back: Deformity (kyphosis) present.     Right lower leg: Edema present.     Left lower leg: Edema present.  Lymphadenopathy:     Cervical: No cervical adenopathy.  Skin:    General: Skin is warm and dry.     Capillary Refill: Capillary refill takes less than 2 seconds.     Findings: No rash.  Neurological:     Mental Status: She is  alert and oriented to person, place, and time.  Psychiatric:        Attention and Perception: Attention normal.        Mood and Affect: Mood normal.        Speech: Speech normal.        Cognition and Memory: Cognition normal.     Wt Readings from Last 3 Encounters:  03/21/20 110 lb (49.9 kg)  09/26/19 113 lb 6.4 oz (51.4 kg)  08/20/19 113 lb 12.8 oz (51.6 kg)    BP (!) 136/50   Pulse 76   Temp 99.1 F (37.3 C) (Oral)   Ht 5' (1.524 m)   Wt 110 lb (49.9 kg)   SpO2 97%   BMI 21.48 kg/m   Assessment and Plan: 1. Essential hypertension Clinically stable exam with well controlled BP on lisinopril, hctz and amlodipine. Tolerating medications without side effects at this time. Pt to continue current regimen and low sodium diet; benefits of regular exercise as able discussed. - amLODipine (NORVASC) 10 MG tablet; Take 1 tablet (10 mg total) by mouth daily.  Dispense: 90 tablet; Refill: 1 - lisinopril-hydrochlorothiazide (ZESTORETIC) 20-12.5 MG tablet; Take 2 tablets by mouth daily.  Dispense: 180 tablet; Refill: 1  2. Stage 3b chronic kidney disease ([place]) Stable for the past 2 years Avoid NSAIDS  3.  Depression, major, single episode, moderate (HCC) Clinically stable on current regimen with good control of symptoms, No SI or HI. Will continue current therapy. - sertraline (ZOLOFT) 50 MG tablet; Take 1 tablet (50 mg total) by mouth daily.  Dispense: 90 tablet; Refill: 1  4. Hyperlipidemia, unspecified hyperlipidemia type Controlled on high intensity statin without side effects - atorvastatin (LIPITOR) 40 MG tablet; TAKE 1 TABLET(40 MG) BY MOUTH DAILY  Dispense: 90 tablet; Refill: 1  5. Gastroesophageal reflux disease, unspecified whether esophagitis present Symptoms well controlled on daily PPI. EGD in 2019 showed esophagitis. No red flag signs such as weight loss, n/v, melena Will continue pantoprazole. - pantoprazole (PROTONIX) 40 MG tablet; Take 1 tablet (40 mg total) by mouth daily.  Dispense: 90 tablet; Refill: 0   Partially dictated using Editor, commissioning. Any errors are unintentional.  Halina Maidens, MD [facility]  03/21/2020

## 2020-03-21 NOTE — Patient Instructions (Signed)
Your labs in May were stable.  Mild decrease in kidney function is unchanged. Continue your same medications. Follow up in 6 months.

## 2020-03-25 ENCOUNTER — Other Ambulatory Visit: Payer: Self-pay | Admitting: Internal Medicine

## 2020-03-25 DIAGNOSIS — K219 Gastro-esophageal reflux disease without esophagitis: Secondary | ICD-10-CM

## 2020-03-26 ENCOUNTER — Telehealth: Payer: Self-pay

## 2020-03-26 NOTE — Telephone Encounter (Signed)
  Please let me know when she is rescheduled in the next 2 weeks so I can move her note.  M

## 2020-03-26 NOTE — Telephone Encounter (Signed)
Called to go over information before tomorrow's appointment with patient's daughter [name]. She states that she needs to reschedule this appointment for patient. She is unable to make appointment due to conflict with scheduling.

## 2020-03-27 ENCOUNTER — Inpatient Hospital Stay: Payer: Medicare Other | Attending: Hematology and Oncology | Admitting: Hematology and Oncology

## 2020-03-27 ENCOUNTER — Other Ambulatory Visit: Payer: Medicare Other

## 2020-03-27 NOTE — Telephone Encounter (Signed)
She moved to 11/29

## 2020-04-01 ENCOUNTER — Other Ambulatory Visit: Payer: Self-pay

## 2020-04-01 DIAGNOSIS — D696 Thrombocytopenia, unspecified: Secondary | ICD-10-CM

## 2020-04-07 ENCOUNTER — Inpatient Hospital Stay: Payer: Medicare Other | Attending: Hematology and Oncology

## 2020-04-07 ENCOUNTER — Inpatient Hospital Stay: Payer: Medicare Other | Admitting: Hematology and Oncology

## 2020-04-07 ENCOUNTER — Other Ambulatory Visit: Payer: Self-pay | Admitting: Hematology and Oncology

## 2020-04-07 DIAGNOSIS — D696 Thrombocytopenia, unspecified: Secondary | ICD-10-CM

## 2020-04-07 DIAGNOSIS — D509 Iron deficiency anemia, unspecified: Secondary | ICD-10-CM

## 2020-07-28 ENCOUNTER — Ambulatory Visit (INDEPENDENT_AMBULATORY_CARE_PROVIDER_SITE_OTHER): Payer: Medicare Other | Admitting: Internal Medicine

## 2020-07-28 ENCOUNTER — Encounter: Payer: Self-pay | Admitting: Internal Medicine

## 2020-07-28 ENCOUNTER — Other Ambulatory Visit: Payer: Self-pay

## 2020-07-28 VITALS — BP 112/78 | HR 73 | Temp 98.2°F | Ht 60.0 in | Wt 105.0 lb

## 2020-07-28 DIAGNOSIS — F321 Major depressive disorder, single episode, moderate: Secondary | ICD-10-CM

## 2020-07-28 DIAGNOSIS — N1831 Chronic kidney disease, stage 3a: Secondary | ICD-10-CM

## 2020-07-28 DIAGNOSIS — I10 Essential (primary) hypertension: Secondary | ICD-10-CM

## 2020-07-28 DIAGNOSIS — D61818 Other pancytopenia: Secondary | ICD-10-CM

## 2020-07-28 DIAGNOSIS — M25473 Effusion, unspecified ankle: Secondary | ICD-10-CM

## 2020-07-28 DIAGNOSIS — D508 Other iron deficiency anemias: Secondary | ICD-10-CM | POA: Diagnosis not present

## 2020-07-28 DIAGNOSIS — D696 Thrombocytopenia, unspecified: Secondary | ICD-10-CM

## 2020-07-28 NOTE — Patient Instructions (Signed)
Stop amlodipine  Elevate legs as often as possible  Labs to evaluate for anemia, worsening kidney function etc.

## 2020-07-28 NOTE — Progress Notes (Signed)
Date:  07/28/2020   Name:  Vanessa Farmer   DOB:  [birth year]   MRN:  [id number]   Chief Complaint: Edema (X2-3 weeks, Swelling in foot and leg, both) and Shortness of Breath (X2 weeks, when walking/ and sitting, comes and goes)  Shortness of Breath This is a recurrent problem. The problem has been waxing and waning. Associated symptoms include leg swelling. Pertinent negatives include no abdominal pain, chest pain, fever, headaches, leg pain, swollen glands or wheezing. The symptoms are aggravated by any activity. She has tried nothing for the symptoms.  Depression        This is a chronic problem.  The problem has been resolved since onset.  Associated symptoms include no headaches.  Past treatments include SSRIs - Selective serotonin reuptake inhibitors.  Compliance with treatment is good.  Previous treatment provided significant relief. Hypertension This is a chronic problem. The problem is controlled. Associated symptoms include shortness of breath. Pertinent negatives include no chest pain, headaches or palpitations. Past treatments include angiotensin blockers, diuretics and calcium channel blockers. The current treatment provides significant improvement. Compliance problems include medication side effects (edema from amodipine).  Hypertensive end-organ damage includes kidney disease and CAD/MI. Identifiable causes of hypertension include chronic renal disease.  Edema - worse in both ankles over the past several weeks. She thinks it is from the amlodipine and being on her feet more.  Lab Results  Component Value Date   CREATININE 1.31 (H) 09/26/2019   BUN 29 (H) 09/26/2019   NA 139 09/26/2019   K 4.0 09/26/2019   CL 98 09/26/2019   CO2 26 09/26/2019   Lab Results  Component Value Date   CHOL 202 (H) 09/26/2019   HDL 61 09/26/2019   LDLCALC 127 (H) 09/26/2019   TRIG 79 09/26/2019   CHOLHDL 3.3 09/26/2019   Lab Results  Component Value Date   TSH 1.470 09/26/2019   No  results found for: HGBA1C Lab Results  Component Value Date   WBC 3.6 (L) 03/28/2019   HGB 11.5 (L) 03/28/2019   HCT 34.4 (L) 03/28/2019   MCV 99.1 03/28/2019   PLT 154 03/28/2019   Lab Results  Component Value Date   ALT 14 09/26/2019   AST 25 09/26/2019   ALKPHOS 65 09/26/2019   BILITOT 0.3 09/26/2019     Review of Systems  Constitutional: Positive for unexpected weight change. Negative for chills, diaphoresis and fever.  Respiratory: Positive for shortness of breath. Negative for cough, chest tightness and wheezing.   Cardiovascular: Positive for leg swelling. Negative for chest pain and palpitations.  Gastrointestinal: Negative for abdominal pain, constipation and diarrhea.  Musculoskeletal: Positive for arthralgias and back pain.  Neurological: Negative for dizziness and headaches.  Psychiatric/Behavioral: Positive for depression. Negative for dysphoric mood. The patient is not nervous/anxious.     Patient Active Problem List   Diagnosis Date Noted  . Ganglion cyst of wrist, right 03/20/2019  . Thrombocytopenia ([place]) 07/19/2018  . Iron deficiency anemia   . Polyp of sigmoid colon   . Pancytopenia ([place]) 09/30/2017  . GERD (gastroesophageal reflux disease) 06/10/2017  . CKD (chronic kidney disease) stage 3, GFR 30-59 ml/min (HCC) 06/08/2016  . Hyperlipidemia 06/08/2016  . Scoliosis 06/07/2016  . Osteoporosis 06/07/2016  . Essential hypertension 06/07/2016  . CAD (coronary artery disease) 06/07/2016  . Depression, major, single episode, moderate ([place]) 06/07/2016    No Known Allergies  Past Surgical History:  Procedure Laterality Date  . ABDOMINAL HYSTERECTOMY    .  CATARACT EXTRACTION, BILATERAL  2021  . COLONOSCOPY WITH PROPOFOL N/A 12/05/2017   Procedure: COLONOSCOPY WITH PROPOFOL;  Surgeon: Lucilla Lame, MD;  Location: [facility];  Service: Endoscopy;  Laterality: N/A;  . ESOPHAGOGASTRODUODENOSCOPY (EGD) WITH PROPOFOL N/A 12/05/2017   Procedure:  ESOPHAGOGASTRODUODENOSCOPY (EGD) WITH PROPOFOL;  Surgeon: Lucilla Lame, MD;  Location: [facility];  Service: Endoscopy;  Laterality: N/A;  . POLYPECTOMY  12/05/2017   Procedure: POLYPECTOMY;  Surgeon: Lucilla Lame, MD;  Location: [facility];  Service: Endoscopy;;  . TONSILLECTOMY      Social History   Tobacco Use  . Smoking status: Never Smoker  . Smokeless tobacco: Never Used  . Tobacco comment: smoking cessation materials not required  Vaping Use  . Vaping Use: Never used  Substance Use Topics  . Alcohol use: No  . Drug use: No     Medication list has been reviewed and updated.  Current Meds  Medication Sig  . acetaminophen (TYLENOL) 500 MG tablet Take 2 tablets (1,000 mg total) by mouth 2 (two) times daily.  Marland Kitchen amLODipine (NORVASC) 10 MG tablet Take 1 tablet (10 mg total) by mouth daily.  Marland Kitchen aspirin 325 MG tablet Take 325 mg by mouth daily.  Marland Kitchen atorvastatin (LIPITOR) 40 MG tablet TAKE 1 TABLET(40 MG) BY MOUTH DAILY  . cholecalciferol (VITAMIN D3) 25 MCG (1000 UT) tablet Take 1,000 Units by mouth daily.  Marland Kitchen ENSURE (ENSURE) Take 237 mLs by mouth 2 (two) times daily between meals.  . ferrous sulfate (FEROSUL) 325 (65 FE) MG tablet TAKE 1 TABLET BY MOUTH TWICE DAILY WITH A MEAL  . lisinopril-hydrochlorothiazide (ZESTORETIC) 20-12.5 MG tablet Take 2 tablets by mouth daily.  . Multiple Vitamins-Calcium (ONE-A-DAY WOMENS PO) Take 1 tablet by mouth daily.  . pantoprazole (PROTONIX) 40 MG tablet TAKE 1 TABLET(40 MG) BY MOUTH DAILY  . sertraline (ZOLOFT) 50 MG tablet Take 1 tablet (50 mg total) by mouth daily.  . vitamin C (ASCORBIC ACID) 500 MG tablet Take 1 tablet (500 mg total) by mouth daily.    PHQ 2/9 Scores 07/28/2020 03/21/2020 09/26/2019 08/20/2019  PHQ - 2 Score 0 0 2 0  PHQ- 9 Score 0 0 2 0    GAD 7 : Generalized Anxiety Score 07/28/2020 03/21/2020 09/26/2019  Nervous, Anxious, on Edge 0 0 0  Control/stop worrying 0 0 0  Worry too much - different things 0 0  0  Trouble relaxing 0 0 0  Restless 0 0 0  Easily annoyed or irritable 0 0 0  Afraid - awful might happen 0 0 0  Total GAD 7 Score 0 0 0  Anxiety Difficulty - - Not difficult at all    BP Readings from Last 3 Encounters:  07/28/20 112/78  03/21/20 (!) 136/50  09/26/19 116/72    Physical Exam Vitals and nursing note reviewed.  Constitutional:      General: She is not in acute distress.    Appearance: She is well-developed. She is not diaphoretic.  HENT:     Head: Normocephalic and atraumatic.  Cardiovascular:     Rate and Rhythm: Normal rate and regular rhythm.  Pulmonary:     Effort: Pulmonary effort is normal. No respiratory distress.     Breath sounds: No decreased breath sounds, wheezing or rhonchi.  Musculoskeletal:     Right lower leg: Edema (ankle laterally) present.     Left lower leg: Edema (ankle laterally) present.     Comments: Significant kyphosis and moderate scoliosis is noted of  the thoracic spine  Skin:    General: Skin is warm and dry.     Findings: No rash.  Neurological:     Mental Status: She is alert and oriented to person, place, and time.  Psychiatric:        Attention and Perception: Attention normal.        Mood and Affect: Mood and affect normal.        Behavior: Behavior normal.     Wt Readings from Last 3 Encounters:  07/28/20 105 lb (47.6 kg)  03/21/20 110 lb (49.9 kg)  09/26/19 113 lb 6.4 oz (51.4 kg)    BP 112/78   Pulse 73   Temp 98.2 F (36.8 C) (Oral)   Ht 5' (1.524 m)   Wt 105 lb (47.6 kg)   SpO2 98%   BMI 20.51 kg/m   Assessment and Plan: 1. Essential hypertension Clinically stable exam with well controlled BP on current medication. Tolerating medications but with edema. Will stop amlodipine ad elevate Check labs for other causes of edema and SOB Recheck BP in 6 weeks - TSH  2. Stage 3a chronic kidney disease (HCC) Check labs and advise - Basic metabolic panel  3. Pancytopenia ([place]) With hx of anemia - not  seem by Hematology in some time - CBC with Differential/Platelet  4. Other iron deficiency anemia - CBC with Differential/Platelet  5. Mild ankle edema Elevate when able  6. Depression, major, single episode, moderate (HCC) Clinically stable on current regimen with good control of symptoms, No SI or HI. Will continue current therapy.  7. Thrombocytopenia ([place]) Check CBC   Partially dictated using Editor, commissioning. Any errors are unintentional.  Halina Maidens, MD [facility]  07/28/2020

## 2020-07-29 LAB — BASIC METABOLIC PANEL
BUN/Creatinine Ratio: 21 (ref 12–28)
BUN: 25 mg/dL (ref 8–27)
CO2: 27 mmol/L (ref 20–29)
Calcium: 9.3 mg/dL (ref 8.7–10.3)
Chloride: 94 mmol/L — ABNORMAL LOW (ref 96–106)
Creatinine, Ser: 1.2 mg/dL — ABNORMAL HIGH (ref 0.57–1.00)
Glucose: 88 mg/dL (ref 65–99)
Potassium: 3.9 mmol/L (ref 3.5–5.2)
Sodium: 136 mmol/L (ref 134–144)
eGFR: 45 mL/min/{1.73_m2} — ABNORMAL LOW (ref >59)

## 2020-07-29 LAB — CBC WITH DIFFERENTIAL/PLATELET
Basophils Absolute: 0 10*3/uL (ref 0.0–0.2)
Basos: 0 %
EOS (ABSOLUTE): 0.1 10*3/uL (ref 0.0–0.4)
Eos: 2 %
Hematocrit: 30.8 % — ABNORMAL LOW (ref 34.0–46.6)
Hemoglobin: 10.7 g/dL — ABNORMAL LOW (ref 11.1–15.9)
Immature Grans (Abs): 0 10*3/uL (ref 0.0–0.1)
Immature Granulocytes: 0 %
Lymphocytes Absolute: 0.8 10*3/uL (ref 0.7–3.1)
Lymphs: 22 %
MCH: 33 pg (ref 26.6–33.0)
MCHC: 34.7 g/dL (ref 31.5–35.7)
MCV: 95 fL (ref 79–97)
Monocytes Absolute: 0.3 10*3/uL (ref 0.1–0.9)
Monocytes: 9 %
Neutrophils Absolute: 2.3 10*3/uL (ref 1.4–7.0)
Neutrophils: 67 %
Platelets: 138 10*3/uL — ABNORMAL LOW (ref 150–450)
RBC: 3.24 x10E6/uL — ABNORMAL LOW (ref 3.77–5.28)
RDW: 12.3 % (ref 11.7–15.4)
WBC: 3.5 10*3/uL (ref 3.4–10.8)

## 2020-07-29 LAB — TSH: TSH: 2.42 u[IU]/mL (ref 0.450–4.500)

## 2020-08-20 ENCOUNTER — Ambulatory Visit: Payer: Medicare Other

## 2020-09-03 ENCOUNTER — Ambulatory Visit: Payer: Medicare Other

## 2020-09-09 ENCOUNTER — Ambulatory Visit: Payer: Medicare Other | Admitting: Internal Medicine

## 2020-09-15 ENCOUNTER — Telehealth: Payer: Self-pay | Admitting: Internal Medicine

## 2020-09-15 ENCOUNTER — Other Ambulatory Visit: Payer: Self-pay | Admitting: Internal Medicine

## 2020-09-15 DIAGNOSIS — K219 Gastro-esophageal reflux disease without esophagitis: Secondary | ICD-10-CM

## 2020-09-15 DIAGNOSIS — E785 Hyperlipidemia, unspecified: Secondary | ICD-10-CM

## 2020-09-15 DIAGNOSIS — F321 Major depressive disorder, single episode, moderate: Secondary | ICD-10-CM

## 2020-09-15 DIAGNOSIS — I10 Essential (primary) hypertension: Secondary | ICD-10-CM

## 2020-09-15 NOTE — Telephone Encounter (Signed)
Copied from [facility] [id number]. Topic: Medicare AWV >> Sep 15, 2020 10:13 AM Cher Nakai R wrote: Reason for CRM:   No answer unable to leave a message for patient to call back and schedule Medicare Annual Wellness Visit (AWV) in office.   If unable to come into the office for AWV,  please offer to do virtually or by telephone.  Last AWV:  08/20/2019  Please schedule at anytime with [facility] Health Advisor.  40 minute appointment  Any questions, please contact me at [phone]

## 2020-09-18 ENCOUNTER — Telehealth: Payer: Self-pay

## 2020-09-18 ENCOUNTER — Encounter: Payer: Medicare Other | Admitting: Internal Medicine

## 2020-09-18 NOTE — Progress Notes (Deleted)
Date:  09/18/2020   Name:  Vanessa Farmer   DOB:  1936/05/07   MRN:  [id number]   Chief Complaint: No chief complaint on file.  Vanessa Farmer is a 85 y.o. female who presents today for her Complete Annual Exam. She feels {DESC; WELL/FAIRLY WELL/POORLY:18703}. She reports exercising ***. She reports she is sleeping {DESC; WELL/FAIRLY WELL/POORLY:18703}. Breast complaints ***.  Mammogram: aged out DEXA: 2014 Colonoscopy: 2019 three polyps hyperplastic  Immunization History  Administered Date(s) Administered  . Fluad Quad(high Dose 65+) 03/20/2019, 03/21/2020  . Influenza, High Dose Seasonal PF 01/31/2017  . Pneumococcal Conjugate-13 06/07/2016  . Pneumococcal Polysaccharide-23 01/17/2018  . Tdap 06/07/2016    Hypertension This is a chronic problem. The problem is controlled. Pertinent negatives include no chest pain, headaches, palpitations or shortness of breath. Past treatments include ACE inhibitors, diuretics and calcium channel blockers. The current treatment provides significant improvement. Identifiable causes of hypertension include chronic renal disease.  Gastroesophageal Reflux She complains of heartburn. She reports no abdominal pain, no chest pain, no coughing or no wheezing. Pertinent negatives include no fatigue. She has tried a PPI for the symptoms. Past procedures include an EGD (esophagitis and HH).  Hyperlipidemia This is a chronic problem. The problem is controlled. Exacerbating diseases include chronic renal disease. Pertinent negatives include no chest pain or shortness of breath. Current antihyperlipidemic treatment includes statins. The current treatment provides significant improvement of lipids.  Depression        This is a chronic problem.The problem is unchanged.  Associated symptoms include no fatigue and no headaches.  Past treatments include SSRIs - Selective serotonin reuptake inhibitors.  Compliance with treatment is good.   Lab Results  Component  Value Date   CREATININE 1.20 (H) 07/28/2020   BUN 25 07/28/2020   NA 136 07/28/2020   K 3.9 07/28/2020   CL 94 (L) 07/28/2020   CO2 27 07/28/2020   Lab Results  Component Value Date   CHOL 202 (H) 09/26/2019   HDL 61 09/26/2019   LDLCALC 127 (H) 09/26/2019   TRIG 79 09/26/2019   CHOLHDL 3.3 09/26/2019   Lab Results  Component Value Date   TSH 2.420 07/28/2020   No results found for: HGBA1C Lab Results  Component Value Date   WBC 3.5 07/28/2020   HGB 10.7 (L) 07/28/2020   HCT 30.8 (L) 07/28/2020   MCV 95 07/28/2020   PLT 138 (L) 07/28/2020   Lab Results  Component Value Date   ALT 14 09/26/2019   AST 25 09/26/2019   ALKPHOS 65 09/26/2019   BILITOT 0.3 09/26/2019     Review of Systems  Constitutional: Negative for chills, fatigue and fever.  HENT: Negative for congestion, hearing loss, tinnitus, trouble swallowing and voice change.   Eyes: Negative for visual disturbance.  Respiratory: Negative for cough, chest tightness, shortness of breath and wheezing.   Cardiovascular: Negative for chest pain, palpitations and leg swelling.  Gastrointestinal: Positive for heartburn. Negative for abdominal pain, constipation, diarrhea and vomiting.  Endocrine: Negative for polydipsia and polyuria.  Genitourinary: Negative for dysuria, frequency, genital sores, vaginal bleeding and vaginal discharge.  Musculoskeletal: Negative for arthralgias, gait problem and joint swelling.  Skin: Negative for color change and rash.  Neurological: Negative for dizziness, tremors, light-headedness and headaches.  Hematological: Negative for adenopathy. Does not bruise/bleed easily.  Psychiatric/Behavioral: Positive for depression. Negative for dysphoric mood and sleep disturbance. The patient is not nervous/anxious.     Patient Active Problem List  Diagnosis Date Noted  . Ganglion cyst of wrist, right 03/20/2019  . Thrombocytopenia ([place]) 07/19/2018  . Iron deficiency anemia   . Polyp of  sigmoid colon   . Pancytopenia ([place]) 09/30/2017  . GERD (gastroesophageal reflux disease) 06/10/2017  . CKD (chronic kidney disease) stage 3, GFR 30-59 ml/min (HCC) 06/08/2016  . Hyperlipidemia 06/08/2016  . Scoliosis 06/07/2016  . Osteoporosis 06/07/2016  . Essential hypertension 06/07/2016  . CAD (coronary artery disease) 06/07/2016  . Depression, major, single episode, moderate ([place]) 06/07/2016    No Known Allergies  Past Surgical History:  Procedure Laterality Date  . ABDOMINAL HYSTERECTOMY    . CATARACT EXTRACTION, BILATERAL  2021  . COLONOSCOPY WITH PROPOFOL N/A 12/05/2017   Procedure: COLONOSCOPY WITH PROPOFOL;  Surgeon: Lucilla Lame, MD;  Location: [place];  Service: Endoscopy;  Laterality: N/A;  . ESOPHAGOGASTRODUODENOSCOPY (EGD) WITH PROPOFOL N/A 12/05/2017   Procedure: ESOPHAGOGASTRODUODENOSCOPY (EGD) WITH PROPOFOL;  Surgeon: Lucilla Lame, MD;  Location: [place];  Service: Endoscopy;  Laterality: N/A;  . POLYPECTOMY  12/05/2017   Procedure: POLYPECTOMY;  Surgeon: Lucilla Lame, MD;  Location: [place];  Service: Endoscopy;;  . TONSILLECTOMY      Social History   Tobacco Use  . Smoking status: Never Smoker  . Smokeless tobacco: Never Used  . Tobacco comment: smoking cessation materials not required  Vaping Use  . Vaping Use: Never used  Substance Use Topics  . Alcohol use: No  . Drug use: No     Medication list has been reviewed and updated.  No outpatient medications have been marked as taking for the 09/18/20 encounter (Appointment) with Glean Hess, MD.    [facility] 2/9 Scores 07/28/2020 03/21/2020 09/26/2019 08/20/2019  PHQ - 2 Score 0 0 2 0  PHQ- 9 Score 0 0 2 0    GAD 7 : Generalized Anxiety Score 07/28/2020 03/21/2020 09/26/2019  Nervous, Anxious, on Edge 0 0 0  Control/stop worrying 0 0 0  Worry too much - different things 0 0 0  Trouble relaxing 0 0 0  Restless 0 0 0  Easily annoyed or irritable 0 0 0  Afraid -  awful might happen 0 0 0  Total GAD 7 Score 0 0 0  Anxiety Difficulty - - Not difficult at all    BP Readings from Last 3 Encounters:  07/28/20 112/78  03/21/20 (!) 136/50  09/26/19 116/72    Physical Exam Vitals and nursing note reviewed.  Constitutional:      General: She is not in acute distress.    Appearance: She is well-developed.  HENT:     Head: Normocephalic and atraumatic.     Right Ear: Tympanic membrane and ear canal normal.     Left Ear: Tympanic membrane and ear canal normal.     Nose:     Right Sinus: No maxillary sinus tenderness.     Left Sinus: No maxillary sinus tenderness.  Eyes:     General: No scleral icterus.       Right eye: No discharge.        Left eye: No discharge.     Conjunctiva/sclera: Conjunctivae normal.  Neck:     Thyroid: No thyromegaly.     Vascular: No carotid bruit.  Cardiovascular:     Rate and Rhythm: Normal rate and regular rhythm.     Pulses: Normal pulses.     Heart sounds: Normal heart sounds.  Pulmonary:     Effort: Pulmonary effort is normal. No  respiratory distress.     Breath sounds: No wheezing.  Chest:  Breasts:     Right: No mass, nipple discharge, skin change or tenderness.     Left: No mass, nipple discharge, skin change or tenderness.    Abdominal:     General: Bowel sounds are normal.     Palpations: Abdomen is soft.     Tenderness: There is no abdominal tenderness.  Musculoskeletal:     Cervical back: Normal range of motion. No erythema.     Right lower leg: No edema.     Left lower leg: No edema.  Lymphadenopathy:     Cervical: No cervical adenopathy.  Skin:    General: Skin is warm and dry.     Capillary Refill: Capillary refill takes less than 2 seconds.     Findings: No rash.  Neurological:     General: No focal deficit present.     Mental Status: She is alert and oriented to person, place, and time.     Cranial Nerves: No cranial nerve deficit.     Sensory: No sensory deficit.     Deep Tendon  Reflexes: Reflexes are normal and symmetric.  Psychiatric:        Attention and Perception: Attention normal.        Mood and Affect: Mood normal.     Wt Readings from Last 3 Encounters:  07/28/20 105 lb (47.6 kg)  03/21/20 110 lb (49.9 kg)  09/26/19 113 lb 6.4 oz (51.4 kg)    There were no vitals taken for this visit.  Assessment and Plan:

## 2020-09-18 NOTE — Telephone Encounter (Signed)
Copied from [place] [phone]. Topic: General - Other >> Sep 18, 2020  8:49 AM Leward Quan A wrote: Reason for CRM: Patient daughter [name] called in and apologized for cancelling appointment at the last minute but her 83 yr old granddaughter is sick and home with her. Appointment was rescheduled but she still apologized for the inconvenience. Any questions please call [name]

## 2020-09-18 NOTE — Telephone Encounter (Signed)
Thank you for the information. No problem at all.

## 2020-09-22 ENCOUNTER — Telehealth: Payer: Self-pay | Admitting: Internal Medicine

## 2020-09-22 NOTE — Telephone Encounter (Signed)
Copied from [place] [id number]. Topic: Medicare AWV >> Sep 22, 2020  1:56 PM Cher Nakai R wrote: Reason for CRM:  Left message for patient to call back and schedule Medicare Annual Wellness Visit (AWV) in office.   If unable to come into the office for AWV,  please offer to do virtually or by telephone.  Last AWV:  08/20/2019  Please schedule at anytime with [facility] Health Advisor.  40 minute appointment  Any questions, please contact me at [phone]

## 2020-10-17 ENCOUNTER — Ambulatory Visit
Admission: RE | Admit: 2020-10-17 | Discharge: 2020-10-17 | Disposition: A | Discharge status: Home / Self Care | Payer: Medicare Other | Attending: Internal Medicine | Admitting: Internal Medicine

## 2020-10-17 ENCOUNTER — Ambulatory Visit
Admission: RE | Admit: 2020-10-17 | Discharge: 2020-10-17 | Disposition: A | Discharge status: Home / Self Care | Payer: Medicare Other | Source: Ambulatory Visit | Attending: Internal Medicine | Admitting: Internal Medicine

## 2020-10-17 ENCOUNTER — Encounter: Payer: Self-pay | Admitting: Internal Medicine

## 2020-10-17 ENCOUNTER — Ambulatory Visit (INDEPENDENT_AMBULATORY_CARE_PROVIDER_SITE_OTHER): Payer: Medicare Other | Admitting: Internal Medicine

## 2020-10-17 ENCOUNTER — Other Ambulatory Visit: Payer: Self-pay

## 2020-10-17 VITALS — BP 134/76 | HR 82 | Temp 97.8°F | Ht 60.0 in | Wt 103.0 lb

## 2020-10-17 DIAGNOSIS — I10 Essential (primary) hypertension: Secondary | ICD-10-CM

## 2020-10-17 DIAGNOSIS — K2100 Gastro-esophageal reflux disease with esophagitis, without bleeding: Secondary | ICD-10-CM

## 2020-10-17 DIAGNOSIS — R0602 Shortness of breath: Secondary | ICD-10-CM | POA: Diagnosis not present

## 2020-10-17 DIAGNOSIS — F321 Major depressive disorder, single episode, moderate: Secondary | ICD-10-CM

## 2020-10-17 DIAGNOSIS — E785 Hyperlipidemia, unspecified: Secondary | ICD-10-CM

## 2020-10-17 DIAGNOSIS — N1831 Chronic kidney disease, stage 3a: Secondary | ICD-10-CM

## 2020-10-17 LAB — POCT URINALYSIS DIPSTICK
Bilirubin, UA: NEGATIVE
Glucose, UA: NEGATIVE
Ketones, UA: NEGATIVE
Nitrite, UA: NEGATIVE
Protein, UA: NEGATIVE
Spec Grav, UA: <=1.005 — AB (ref 1.010–1.025)
Urobilinogen, UA: 0.2 E.U./dL
pH, UA: 5 (ref 5.0–8.0)

## 2020-10-17 MED ORDER — FUROSEMIDE 20 MG PO TABS: 20.0000 mg | ORAL_TABLET | ORAL | 0 refills | Status: DC

## 2020-10-17 NOTE — Progress Notes (Signed)
Date:  10/17/2020   Name:  Vanessa Farmer   DOB:  January 17, 1936   MRN:  [id number]   Chief Complaint: Annual Exam (Breast exam no pap, SOB ) Vanessa Farmer is a 85 y.o. female who presents today for her Complete Annual Exam. She feels well. She reports exercising none. She reports she is sleeping well. Breast complaints none.  Mammogram: aged out DEXA: declined Pap smear: discontinued/aged out Colonoscopy: 2019 EGD 2019 10 cm HH with esophagitis  Immunization History  Administered Date(s) Administered   Fluad Quad(high Dose 65+) 03/20/2019, 03/21/2020   Influenza, High Dose Seasonal PF 01/31/2017   Pneumococcal Conjugate-13 06/07/2016   Pneumococcal Polysaccharide-23 01/17/2018   Tdap 06/07/2016    Hypertension This is a chronic problem. The problem is controlled. Associated symptoms include shortness of breath. Pertinent negatives include no chest pain, headaches or palpitations. Past treatments include ACE inhibitors and diuretics.  Hyperlipidemia This is a chronic problem. The problem is controlled. Recent lipid tests were reviewed and are normal. Associated symptoms include shortness of breath. Pertinent negatives include no chest pain. Current antihyperlipidemic treatment includes statins. The current treatment provides significant improvement of lipids.  Depression        This is a chronic problem.  The problem has been resolved since onset.  Associated symptoms include no fatigue and no headaches.  Past treatments include SSRIs - Selective serotonin reuptake inhibitors.  Compliance with treatment is good. Gastroesophageal Reflux She complains of heartburn. She reports no abdominal pain, no chest pain, no coughing or no wheezing. This is a chronic problem. Pertinent negatives include no fatigue. Risk factors include hiatal hernia. She has tried a PPI for the symptoms. The treatment provided moderate relief. Past procedures include an EGD.   Lab Results  Component Value Date    CREATININE 1.20 (H) 07/28/2020   BUN 25 07/28/2020   NA 136 07/28/2020   K 3.9 07/28/2020   CL 94 (L) 07/28/2020   CO2 27 07/28/2020   Lab Results  Component Value Date   CHOL 202 (H) 09/26/2019   HDL 61 09/26/2019   LDLCALC 127 (H) 09/26/2019   TRIG 79 09/26/2019   CHOLHDL 3.3 09/26/2019   Lab Results  Component Value Date   TSH 2.420 07/28/2020   No results found for: HGBA1C Lab Results  Component Value Date   WBC 3.5 07/28/2020   HGB 10.7 (L) 07/28/2020   HCT 30.8 (L) 07/28/2020   MCV 95 07/28/2020   PLT 138 (L) 07/28/2020   Lab Results  Component Value Date   ALT 14 09/26/2019   AST 25 09/26/2019   ALKPHOS 65 09/26/2019   BILITOT 0.3 09/26/2019     Review of Systems  Constitutional:  Negative for chills, fatigue and fever.  HENT:  Negative for congestion, hearing loss, tinnitus, trouble swallowing and voice change.   Eyes:  Negative for visual disturbance.  Respiratory:  Positive for shortness of breath. Negative for cough, chest tightness and wheezing.   Cardiovascular:  Positive for leg swelling. Negative for chest pain and palpitations.  Gastrointestinal:  Positive for heartburn. Negative for abdominal pain, constipation, diarrhea and vomiting.  Endocrine: Negative for polydipsia and polyuria.  Genitourinary:  Negative for dysuria, frequency, genital sores, vaginal bleeding and vaginal discharge.  Musculoskeletal:  Positive for back pain and gait problem. Negative for arthralgias and joint swelling.  Skin:  Negative for color change and rash.  Neurological:  Negative for dizziness, tremors, weakness, light-headedness and headaches.  Hematological:  Negative  for adenopathy. Does not bruise/bleed easily.  Psychiatric/Behavioral:  Positive for depression. Negative for dysphoric mood and sleep disturbance. The patient is not nervous/anxious.    Patient Active Problem List   Diagnosis Date Noted   Ganglion cyst of wrist, right 03/20/2019   Thrombocytopenia  ([place]) 07/19/2018   Iron deficiency anemia    Polyp of sigmoid colon    Pancytopenia ([employer]) 09/30/2017   GERD (gastroesophageal reflux disease) 06/10/2017   CKD (chronic kidney disease) stage 3, GFR 30-59 ml/min ([place]) 06/08/2016   Hyperlipidemia 06/08/2016   Scoliosis 06/07/2016   Osteoporosis 06/07/2016   Essential hypertension 06/07/2016   CAD (coronary artery disease) 06/07/2016   Depression, major, single episode, moderate ([place]) 06/07/2016    No Known Allergies  Past Surgical History:  Procedure Laterality Date   ABDOMINAL HYSTERECTOMY     CATARACT EXTRACTION, BILATERAL  2021   COLONOSCOPY WITH PROPOFOL N/A 12/05/2017   Procedure: COLONOSCOPY WITH PROPOFOL;  Surgeon: Lucilla Lame, MD;  Location: [place];  Service: Endoscopy;  Laterality: N/A;   ESOPHAGOGASTRODUODENOSCOPY (EGD) WITH PROPOFOL N/A 12/05/2017   Procedure: ESOPHAGOGASTRODUODENOSCOPY (EGD) WITH PROPOFOL;  Surgeon: Lucilla Lame, MD;  Location: [place];  Service: Endoscopy;  Laterality: N/A;   POLYPECTOMY  12/05/2017   Procedure: POLYPECTOMY;  Surgeon: Lucilla Lame, MD;  [facility];  Service: Endoscopy;;   TONSILLECTOMY      Social History   Tobacco Use   Smoking status: Never   Smokeless tobacco: Never   Tobacco comments:    smoking cessation materials not required  Vaping Use   Vaping Use: Never used  Substance Use Topics   Alcohol use: No   Drug use: No     Medication list has been reviewed and updated.  Current Meds  Medication Sig   amLODipine (NORVASC) 10 MG tablet Take 1 tablet (10 mg total) by mouth daily.   aspirin 325 MG tablet Take 325 mg by mouth daily.   atorvastatin (LIPITOR) 40 MG tablet TAKE 1 TABLET(40 MG) BY MOUTH DAILY   cholecalciferol (VITAMIN D3) 25 MCG (1000 UT) tablet Take 1,000 Units by mouth daily.   ENSURE (ENSURE) Take 237 mLs by mouth 2 (two) times daily between meals.   ferrous sulfate (FEROSUL) 325 (65 FE) MG tablet TAKE 1 TABLET BY  MOUTH TWICE DAILY WITH A MEAL   [START ON 10/20/2020] furosemide (LASIX) 20 MG tablet Take 1 tablet (20 mg total) by mouth 2 (two) times a week.   lisinopril-hydrochlorothiazide (ZESTORETIC) 20-12.5 MG tablet TAKE 2 TABLETS BY MOUTH DAILY   Multiple Vitamins-Calcium (ONE-A-DAY WOMENS PO) Take 1 tablet by mouth daily.   pantoprazole (PROTONIX) 40 MG tablet TAKE 1 TABLET(40 MG) BY MOUTH DAILY   sertraline (ZOLOFT) 50 MG tablet TAKE 1 TABLET(50 MG) BY MOUTH DAILY   vitamin C (ASCORBIC ACID) 500 MG tablet Take 1 tablet (500 mg total) by mouth daily.    PHQ 2/9 Scores 10/17/2020 07/28/2020 03/21/2020 09/26/2019  PHQ - 2 Score 0 0 0 2  PHQ- 9 Score 0 0 0 2    GAD 7 : Generalized Anxiety Score 10/17/2020 07/28/2020 03/21/2020 09/26/2019  Nervous, Anxious, on Edge 0 0 0 0  Control/stop worrying 0 0 0 0  Worry too much - different things 0 0 0 0  Trouble relaxing 0 0 0 0  Restless 0 0 0 0  Easily annoyed or irritable 0 0 0 0  Afraid - awful might happen 0 0 0 0  Total GAD 7 Score 0  0 0 0  Anxiety Difficulty - - - Not difficult at all    BP Readings from Last 3 Encounters:  10/17/20 134/76  07/28/20 112/78  03/21/20 (!) 136/50    Physical Exam Vitals and nursing note reviewed.  Constitutional:      General: She is not in acute distress.    Appearance: She is well-developed.  HENT:     Head: Normocephalic and atraumatic.     Right Ear: Tympanic membrane and ear canal normal.     Left Ear: Tympanic membrane and ear canal normal.     Nose:     Right Sinus: No maxillary sinus tenderness.     Left Sinus: No maxillary sinus tenderness.  Eyes:     General: No scleral icterus.       Right eye: No discharge.        Left eye: No discharge.     Conjunctiva/sclera: Conjunctivae normal.  Neck:     Thyroid: No thyromegaly.     Vascular: No carotid bruit.  Cardiovascular:     Rate and Rhythm: Normal rate and regular rhythm.     Pulses:          Dorsalis pedis pulses are 0 on the right side and  0 on the left side.       Posterior tibial pulses are 0 on the right side and 0 on the left side.     Heart sounds: Murmur heard.  Systolic murmur is present with a grade of 2/6.    No gallop.  Pulmonary:     Effort: Pulmonary effort is normal. No respiratory distress.     Breath sounds: Examination of the left-middle field reveals decreased breath sounds. Decreased breath sounds present. No wheezing.  Chest:  Breasts:    Right: No mass, nipple discharge, skin change or tenderness.     Left: No mass, nipple discharge, skin change or tenderness.  Abdominal:     General: Bowel sounds are normal.     Palpations: Abdomen is soft.     Tenderness: There is no abdominal tenderness.  Musculoskeletal:     Cervical back: Normal range of motion. No erythema.     Right lower leg: 2+ Pitting Edema present.     Left lower leg: 2+ Pitting Edema present.  Lymphadenopathy:     Cervical: No cervical adenopathy.  Skin:    General: Skin is warm and dry.     Findings: No rash.  Neurological:     Mental Status: She is alert and oriented to person, place, and time.     Cranial Nerves: No cranial nerve deficit.     Sensory: No sensory deficit.     Gait: Gait abnormal (uses cane for balance).     Deep Tendon Reflexes: Reflexes are normal and symmetric.  Psychiatric:        Attention and Perception: Attention normal.        Mood and Affect: Mood normal.    Wt Readings from Last 3 Encounters:  10/17/20 103 lb (46.7 kg)  07/28/20 105 lb (47.6 kg)  03/21/20 110 lb (49.9 kg)    BP 134/76   Pulse 82   Temp 97.8 F (36.6 C) (Oral)   Ht 5' (1.524 m)   Wt 103 lb (46.7 kg)   SpO2 96%   BMI 20.12 kg/m   Assessment and Plan: 1. Essential hypertension Clinically stable exam with well controlled BP. Tolerating medications without side effects at this time. Pt to continue current  regimen and low sodium diet;  - CBC with Differential/Platelet - TSH - POCT urinalysis dipstick  2. Stage 3a chronic  kidney disease (HCC) Monitor labs - Comprehensive metabolic panel  3. Hyperlipidemia, unspecified hyperlipidemia type Tolerating statin medication without side effects at this time LDL is at goal of < 70 on current dose Continue same therapy without change at this time. - Lipid panel  4. Gastroesophageal reflux disease with esophagitis without hemorrhage Symptoms well controlled on daily PPI No red flag signs such as weight loss, n/v, melena Will continue pantoprazole - CBC with Differential/Platelet  5. Depression, major, single episode, moderate (HCC) Clinically stable on current regimen with good control of symptoms, No SI or HI. Will continue current therapy.  6. SOB (shortness of breath) Worsening SOB and edema Partially due to kyphoscoliosis but may also be secondary to coronary disease Will add biw lasix - furosemide (LASIX) 20 MG tablet; Take 1 tablet (20 mg total) by mouth 2 (two) times a week.  Dispense: 30 tablet; Refill: 0 - DG Chest 2 View; Future - Ambulatory referral to Cardiology   Partially dictated using [place]. Any errors are unintentional.  Halina Maidens, MD [facility]  10/17/2020

## 2020-10-18 LAB — CBC WITH DIFFERENTIAL/PLATELET
Basophils Absolute: 0 10*3/uL (ref 0.0–0.2)
Basos: 1 %
EOS (ABSOLUTE): 0.2 10*3/uL (ref 0.0–0.4)
Eos: 5 %
Hematocrit: 32.8 % — ABNORMAL LOW (ref 34.0–46.6)
Hemoglobin: 11.4 g/dL (ref 11.1–15.9)
Immature Grans (Abs): 0 10*3/uL (ref 0.0–0.1)
Immature Granulocytes: 0 %
Lymphocytes Absolute: 1 10*3/uL (ref 0.7–3.1)
Lymphs: 26 %
MCH: 32.5 pg (ref 26.6–33.0)
MCHC: 34.8 g/dL (ref 31.5–35.7)
MCV: 93 fL (ref 79–97)
Monocytes Absolute: 0.3 10*3/uL (ref 0.1–0.9)
Monocytes: 9 %
Neutrophils Absolute: 2.3 10*3/uL (ref 1.4–7.0)
Neutrophils: 59 %
Platelets: 170 10*3/uL (ref 150–450)
RBC: 3.51 x10E6/uL — ABNORMAL LOW (ref 3.77–5.28)
RDW: 13 % (ref 11.7–15.4)
WBC: 3.9 10*3/uL (ref 3.4–10.8)

## 2020-10-18 LAB — COMPREHENSIVE METABOLIC PANEL
ALT: 23 IU/L (ref 0–32)
AST: 29 IU/L (ref 0–40)
Albumin/Globulin Ratio: 2 (ref 1.2–2.2)
Albumin: 4.6 g/dL (ref 3.6–4.6)
Alkaline Phosphatase: 96 IU/L (ref 44–121)
BUN/Creatinine Ratio: 29 — ABNORMAL HIGH (ref 12–28)
BUN: 38 mg/dL — ABNORMAL HIGH (ref 8–27)
Bilirubin Total: 0.4 mg/dL (ref 0.0–1.2)
CO2: 25 mmol/L (ref 20–29)
Calcium: 9.6 mg/dL (ref 8.7–10.3)
Chloride: 95 mmol/L — ABNORMAL LOW (ref 96–106)
Creatinine, Ser: 1.33 mg/dL — ABNORMAL HIGH (ref 0.57–1.00)
Globulin, Total: 2.3 g/dL (ref 1.5–4.5)
Glucose: 101 mg/dL — ABNORMAL HIGH (ref 65–99)
Potassium: 3.7 mmol/L (ref 3.5–5.2)
Sodium: 138 mmol/L (ref 134–144)
Total Protein: 6.9 g/dL (ref 6.0–8.5)
eGFR: 40 mL/min/{1.73_m2} — ABNORMAL LOW (ref >59)

## 2020-10-18 LAB — LIPID PANEL
Chol/HDL Ratio: 2.3 ratio (ref 0.0–4.4)
Cholesterol, Total: 162 mg/dL (ref 100–199)
HDL: 71 mg/dL (ref >39)
LDL Chol Calc (NIH): 80 mg/dL (ref 0–99)
Triglycerides: 52 mg/dL (ref 0–149)
VLDL Cholesterol Cal: 11 mg/dL (ref 5–40)

## 2020-10-18 LAB — TSH: TSH: 2 u[IU]/mL (ref 0.450–4.500)

## 2020-10-20 ENCOUNTER — Encounter: Payer: Self-pay | Admitting: Internal Medicine

## 2020-10-20 DIAGNOSIS — I700 Atherosclerosis of aorta: Secondary | ICD-10-CM | POA: Insufficient documentation

## 2020-11-19 ENCOUNTER — Ambulatory Visit: Payer: Medicare Other | Admitting: Internal Medicine

## 2020-11-24 ENCOUNTER — Ambulatory Visit: Payer: Medicare Other | Admitting: Internal Medicine

## 2020-11-25 ENCOUNTER — Encounter: Payer: Self-pay | Admitting: Internal Medicine

## 2020-12-28 ENCOUNTER — Other Ambulatory Visit: Payer: Self-pay | Admitting: Internal Medicine

## 2020-12-28 DIAGNOSIS — E785 Hyperlipidemia, unspecified: Secondary | ICD-10-CM

## 2020-12-28 NOTE — Telephone Encounter (Signed)
Requested Prescriptions  Pending Prescriptions Disp Refills  . atorvastatin (LIPITOR) 40 MG tablet [Pharmacy Med Name: ATORVASTATIN '40MG'$  TABLETS] 90 tablet 2    Sig: TAKE 1 TABLET(40 MG) BY MOUTH DAILY     Cardiovascular:  Antilipid - Statins Passed - 12/28/2020 11:24 AM      Passed - Total Cholesterol in normal range and within 360 days    Cholesterol, Total  Date Value Ref Range Status  10/17/2020 162 100 - 199 mg/dL Final         Passed - LDL in normal range and within 360 days    LDL Chol Calc (NIH)  Date Value Ref Range Status  10/17/2020 80 0 - 99 mg/dL Final         Passed - HDL in normal range and within 360 days    HDL  Date Value Ref Range Status  10/17/2020 71 >39 mg/dL Final         Passed - Triglycerides in normal range and within 360 days    Triglycerides  Date Value Ref Range Status  10/17/2020 52 0 - 149 mg/dL Final         Passed - Patient is not pregnant      Passed - Valid encounter within last 12 months    Recent Outpatient Visits          2 months ago Essential hypertension   [place], Laura H, MD   5 months ago Essential hypertension   [place], Laura H, MD   9 months ago Essential hypertension   [facility] Glean Hess, MD   1 year ago Essential hypertension   [facility] Glean Hess, MD   1 year ago Hyperlipidemia, unspecified hyperlipidemia type   [facility] Glean Hess, MD      Future Appointments            In 9 months [place] Melia Jesse Sans, MD [facility], [facility]

## 2021-01-24 ENCOUNTER — Ambulatory Visit (INDEPENDENT_AMBULATORY_CARE_PROVIDER_SITE_OTHER): Payer: Medicare Other | Admitting: *Deleted

## 2021-01-24 ENCOUNTER — Other Ambulatory Visit: Payer: Self-pay

## 2021-01-24 DIAGNOSIS — Z0000 Encounter for general adult medical examination without abnormal findings: Secondary | ICD-10-CM

## 2021-01-24 NOTE — Progress Notes (Addendum)
Subjective:   Vanessa Farmer is a 85 y.o. female who presents for Medicare Annual (Subsequent) preventive examination.  I connected with  DARTHY BODELL on 01/27/21 by a audio enabled telemedicine application and verified that I am speaking with the correct person using two identifiers.   I discussed the limitations of evaluation and management by telemedicine. The patient expressed understanding and agreed to proceed.    Location of patient:Home Location of staff: Office    People attending visit: Halima Darrigo; Emilio Aspen, RMA Objective:    Today's Vitals   There is no height or weight on file to calculate BMI.  Advanced Directives 08/20/2019 03/28/2019 09/27/2018 07/19/2018 04/20/2018 01/19/2018 12/05/2017  Does Patient Have a Medical Advance Directive? No No No No Yes Yes Yes  Type of Advance Directive - - - - - - Press photographer  Does patient want to make changes to medical advance directive? - - - - - - No - Patient declined  Copy of [place] in Chart? - - - - - - No - copy requested  Would patient like information on creating a medical advance directive? No - Patient declined No - Patient declined - Yes (MAU/Ambulatory/Procedural Areas - Information given) - - -    Current Medications (verified) Outpatient Encounter Medications as of 01/24/2021  Medication Sig   amLODipine (NORVASC) 10 MG tablet Take 1 tablet (10 mg total) by mouth daily.   aspirin 325 MG tablet Take 325 mg by mouth daily.   atorvastatin (LIPITOR) 40 MG tablet TAKE 1 TABLET(40 MG) BY MOUTH DAILY   cholecalciferol (VITAMIN D3) 25 MCG (1000 UT) tablet Take 1,000 Units by mouth daily.   ferrous sulfate (FEROSUL) 325 (65 FE) MG tablet TAKE 1 TABLET BY MOUTH TWICE DAILY WITH A MEAL   furosemide (LASIX) 20 MG tablet Take 1 tablet (20 mg total) by mouth 2 (two) times a week.   lisinopril-hydrochlorothiazide (ZESTORETIC) 20-12.5 MG tablet TAKE 2 TABLETS BY MOUTH DAILY   Multiple  Vitamins-Calcium (ONE-A-DAY WOMENS PO) Take 1 tablet by mouth daily.   pantoprazole (PROTONIX) 40 MG tablet TAKE 1 TABLET(40 MG) BY MOUTH DAILY   sertraline (ZOLOFT) 50 MG tablet TAKE 1 TABLET(50 MG) BY MOUTH DAILY   vitamin C (ASCORBIC ACID) 500 MG tablet Take 1 tablet (500 mg total) by mouth daily.   ENSURE (ENSURE) Take 237 mLs by mouth 2 (two) times daily between meals.   No facility-administered encounter medications on file as of 01/24/2021.    Allergies (verified) Patient has no known allergies.   History: Past Medical History:  Diagnosis Date   Anemia    Cataract    Depression    Fall    GERD (gastroesophageal reflux disease)    Hyperlipidemia    Hypertension    Myocardial infarction [facility])    "mild" many yrs ago   Past Surgical History:  Procedure Laterality Date   ABDOMINAL HYSTERECTOMY     CATARACT EXTRACTION, BILATERAL  2021   COLONOSCOPY WITH PROPOFOL N/A 12/05/2017   Procedure: COLONOSCOPY WITH PROPOFOL;  Surgeon: Lucilla Lame, MD;  Location: [facility];  Service: Endoscopy;  Laterality: N/A;   ESOPHAGOGASTRODUODENOSCOPY (EGD) WITH PROPOFOL N/A 12/05/2017   Procedure: ESOPHAGOGASTRODUODENOSCOPY (EGD) WITH PROPOFOL;  Surgeon: Lucilla Lame, MD;  Location: [facility];  Service: Endoscopy;  Laterality: N/A;   POLYPECTOMY  12/05/2017   Procedure: POLYPECTOMY;  Surgeon: Lucilla Lame, MD;  Location: [place];  Service: Endoscopy;;   TONSILLECTOMY  Family History  Problem Relation Age of Onset   Heart disease Mother    Brain cancer Mother    Heart attack Father    Liver disease Daughter    Throat cancer Brother    Cancer Maternal Grandfather        Unsure type   Social History   Socioeconomic History   Marital status: Widowed    Spouse name: Not on file   Number of children: 4   Years of education: 9 th grade   Highest education level: 9th grade  Occupational History   Occupation: Retired  Tobacco Use   Smoking status: Never    Smokeless tobacco: Never   Tobacco comments:    smoking cessation materials not required  Vaping Use   Vaping Use: Never used  Substance and Sexual Activity   Alcohol use: No   Drug use: No   Sexual activity: Not Currently  Other Topics Concern   Not on file  Social History Narrative   Pt lives with her son   Social Determinants of Health   Financial Resource Strain: Low Risk    Difficulty of Paying Living Expenses: Not hard at all  Food Insecurity: No Food Insecurity   Worried About Charity fundraiser in the Last Year: Never true   Arboriculturist in the Last Year: Never true  Transportation Needs: No Transportation Needs   Lack of Transportation (Medical): No   Lack of Transportation (Non-Medical): No  Physical Activity: Sufficiently Active   Days of Exercise per Week: 3 days   Minutes of Exercise per Session: 60 min  Stress: No Stress Concern Present   Feeling of Stress : Not at all  Social Connections: Socially Isolated   Frequency of Communication with Friends and Family: More than three times a week   Frequency of Social Gatherings with Friends and Family: More than three times a week   Attends Religious Services: Never   Marine scientist or Organizations: No   Attends Archivist Meetings: Never   Marital Status: Widowed    Tobacco Counseling Counseling given: Yes Tobacco comments: smoking cessation materials not required   Clinical Intake:   Pain : 0-10 Pain Score:  (per pt her arm just hurt with no pain) Pain Type: Acute pain Pain Location: Arm Pain Orientation: Right Pain Descriptors / Indicators: Other (Comment) (per pt arm just hurt. Pt did not say indicators) Pain Onset: 1 to 4 weeks ago   Diabetic?No   Activities of Daily Living In your present state of health, do you have any difficulty performing the following activities: 01/24/2021 10/17/2020  Hearing? Tempie Donning  Vision? N N  Difficulty concentrating or making decisions? Y N   Comment Sometimes she do -  Walking or climbing stairs? Y Y  Dressing or bathing? Y N  Comment started the last 2 weeks ago -  Doing errands, shopping? Y Y  Comment Family does it for her -  Some recent data might be hidden    Patient Care Team: Glean Hess, MD as PCP - General (Internal Medicine) Lucilla Lame, MD as Consulting Physician (Gastroenterology) Lequita Asal, MD (Inactive) as Referring Physician (Hematology and Oncology)  Indicate any recent Medical Services you may have received from other than [facility] providers in the past year (date may be approximate).     Assessment:   This is a routine wellness examination for Vanessa Farmer.  Hearing/Vision screen No results found.  Dietary issues and exercise  activities discussed:     Goals Addressed   None   Depression Screen PHQ 2/9 Scores 01/24/2021 10/17/2020 07/28/2020 03/21/2020 09/26/2019 08/20/2019 03/20/2019  PHQ - 2 Score 1 0 0 0 2 0 4  PHQ- 9 Score 2 0 0 0 2 0 4    Fall Risk Fall Risk  01/24/2021 10/17/2020 07/28/2020 03/21/2020 09/26/2019  Falls in the past year? 1 1 0 0 0  Number falls in past yr: 1 1 - - 0  Injury with Fall? 1 0 - - 0  Risk Factor Category  - - - - -  Risk for fall due to : History of fall(s);Other (Comment) - - - Impaired balance/gait;History of fall(s);Impaired mobility;Impaired vision  Risk for fall due to: Comment Per pt due to Dizziness - - - -  Follow up Falls evaluation completed;Follow up appointment Falls evaluation completed Falls evaluation completed Falls evaluation completed Falls evaluation completed  Comment Informed patient to call office to inform them about her falls and schedule an appt - - - -    FALL RISK PREVENTION PERTAINING TO THE HOME:  Any stairs in or around the home? No  If so, are there any without handrails? No  Home free of loose throw rugs in walkways, pet beds, electrical cords, etc? No  Adequate lighting in your home to reduce risk of falls? Yes    ASSISTIVE DEVICES UTILIZED TO PREVENT FALLS:  Life alert? Yes  Use of a cane, walker or w/c? Yes  Grab bars in the bathroom? No  Shower chair or bench in shower? Yes  Elevated toilet seat or a handicapped toilet? No     Cognitive Function:  6CIT Screen 01/24/2021 08/20/2019 07/19/2018 06/01/2017  What Year? 4 points 0 points 0 points 0 points  What month? 3 points 0 points 0 points 3 points  What time? 0 points 0 points 0 points 0 points  Count back from 20 0 points 0 points 0 points 0 points  Months in reverse 4 points 4 points 4 points 4 points  Repeat phrase (No Data) 10 points 10 points 0 points  Total Score - '14 14 7    '$ Immunizations Immunization History  Administered Date(s) Administered   Fluad Quad(high Dose 65+) 03/20/2019, 03/21/2020   Influenza, High Dose Seasonal PF 01/31/2017   Pneumococcal Conjugate-13 06/07/2016   Pneumococcal Polysaccharide-23 01/17/2018   Tdap 06/07/2016    TDAP status: Up to date  Flu Vaccine status: Due, Education has been provided regarding the importance of this vaccine. Advised may receive this vaccine at local pharmacy or Health Dept. Aware to provide a copy of the vaccination record if obtained from local pharmacy or Health Dept. Verbalized acceptance and understanding.  Pneumococcal vaccine status: Due, Education has been provided regarding the importance of this vaccine. Advised may receive this vaccine at local pharmacy or Health Dept. Aware to provide a copy of the vaccination record if obtained from local pharmacy or Health Dept. Verbalized acceptance and understanding.  Covid-19 vaccine status: Information provided on how to obtain vaccines.   Qualifies for Shingles Vaccine? Yes   Zostavax completed No   Shingrix Completed?: No.    Education has been provided regarding the importance of this vaccine. Patient has been advised to call insurance company to determine out of pocket expense if they have not yet received this vaccine.  Advised may also receive vaccine at local pharmacy or Health Dept. Verbalized acceptance and understanding.  Screening Tests Health Maintenance  Topic Date Due  COVID-19 Vaccine (1) Never done   Zoster Vaccines- Shingrix (1 of 2) Never done   INFLUENZA VACCINE  12/08/2020   TETANUS/TDAP  06/07/2026   DEXA SCAN  Completed   HPV VACCINES  Aged Out    Health Maintenance  Health Maintenance Due  Topic Date Due   COVID-19 Vaccine (1) Never done   Zoster Vaccines- Shingrix (1 of 2) Never done   INFLUENZA VACCINE  12/08/2020    Colorectal cancer screening: Type of screening: Colonoscopy. Completed 11/28/2017. Repeat every 10 years  Mammogram Status: Patient have not completed. Patient informed to contacted her PCP.   Lung Cancer Screening: (Low Dose CT Chest recommended if Age 17-80 years, 30 pack-year currently smoking OR have quit w/in 15years.) does not qualify.    Additional Screening:  Hepatitis C Screening: does qualify; Completed Patient informed to call office to complete.  Vision Screening: Recommended annual ophthalmology exams for early detection of glaucoma and other disorders of the eye. Is the patient up to date with their annual eye exam?  Yes  Who is the provider or what is the name of the office in which the patient attends annual eye exams? Patient stated she do not remember the eye doctor's name. If pt is not established with a provider, would they like to be referred to a provider to establish care? No .   Dental Screening: Recommended annual dental exams for proper oral hygiene  Community Resource Referral / Chronic Care Management: CRR required this visit?  No   CCM required this visit?  No      Plan:     I have personally reviewed and noted the following in the patient's chart:   Medical and social history Use of alcohol, tobacco or illicit drugs  Current medications and supplements including opioid prescriptions.  Functional ability and  status Nutritional status Physical activity Advanced directives List of other physicians Hospitalizations, surgeries, and ER visits in previous 12 months Vitals Screenings to include cognitive, depression, and falls Referrals and appointments  In addition, I have reviewed and discussed with patient certain preventive protocols, quality metrics, and best practice recommendations. A written personalized care plan for preventive services as well as general preventive health recommendations were provided to patient.     Emilio Aspen [place], [place]   [id number]   Nurse Notes: None Face to Face, 45 min  Patient visit was completed via telephone and she verbalized understanding during entire visit. Staff informed patient to please contact PCP office to schedule to get her vaccines and screening she's behind on. Patient agreed and verbalized understanding.   Losing balance really bad, right arm is hurting and giving her a lot pain, Per pt she fell about 2-3 weeks now. Per pt she did not call the office.   Per pt she did not hit her ead or black out.  Per pt her arm just hurt but it dont have any pain.  Per pt she feels like it have a knot in it and really sore and doesn't think she broke her arm.  Per pt when she turns really quick she loses her balance and has not told the office yet.  Per pt she was waiting until she goes back to the doctor that's why she did not call office. Per pt when she turns around really sharp then she gets off balance.  Per pt she fell about 8-9 times with in the last year and is trying even hard now to not fall and take her  time. Per pt she does not have a lot of energy anymore.  Per pt, PCP checked her feet and that's why she put her on fluid pills Cataract in right eye x2 and 1 on the left and having to put drops in his eye Went to see the eye doctor about 2 months ago.  Patient states she can not remember the name of the eye doctor  Patient has a cane  Per pt  she wears glasses  Per pt she thinks she does have a POA but she doesn't remember.  Per pt she thinks her daughter got it but she doesn't know. Per pt her daughter does everyething for her that's outside of the house   I have reviewed the health advisor's note, was available for consultation, and agree with documentation and plan.  Halina Maidens, MD

## 2021-03-06 ENCOUNTER — Other Ambulatory Visit: Payer: Self-pay | Admitting: Internal Medicine

## 2021-03-06 DIAGNOSIS — K219 Gastro-esophageal reflux disease without esophagitis: Secondary | ICD-10-CM

## 2021-03-07 NOTE — Telephone Encounter (Signed)
Requested Prescriptions  Pending Prescriptions Disp Refills  . pantoprazole (PROTONIX) 40 MG tablet [Pharmacy Med Name: PANTOPRAZOLE 40MG  TABLETS] 90 tablet 1    Sig: TAKE 1 TABLET(40 MG) BY MOUTH DAILY     Gastroenterology: Proton Pump Inhibitors Passed - 03/06/2021 12:20 PM      Passed - Valid encounter within last 12 months    Recent Outpatient Visits          4 months ago Essential hypertension   [place], [place]   7 months ago Essential hypertension   [place], MD   11 months ago Essential hypertension   [facility] Glean Hess, MD   1 year ago Essential hypertension   [facility] Glean Hess, MD   1 year ago Hyperlipidemia, unspecified hyperlipidemia type   [facility] Glean Hess, MD      Future Appointments            In 7 months Army Melia Jesse Sans, MD [facility], [facility]

## 2021-03-14 ENCOUNTER — Other Ambulatory Visit: Payer: Self-pay | Admitting: Internal Medicine

## 2021-03-14 DIAGNOSIS — F321 Major depressive disorder, single episode, moderate: Secondary | ICD-10-CM

## 2021-03-14 DIAGNOSIS — I10 Essential (primary) hypertension: Secondary | ICD-10-CM

## 2021-03-14 NOTE — Telephone Encounter (Signed)
Requested Prescriptions  Pending Prescriptions Disp Refills  . amLODipine (NORVASC) 10 MG tablet [Pharmacy Med Name: AMLODIPINE BESYLATE 10MG  TABLETS] 90 tablet 1    Sig: TAKE 1 TABLET(10 MG) BY MOUTH DAILY     Cardiovascular:  Calcium Channel Blockers Passed - 03/14/2021  7:10 AM      Passed - Last BP in normal range    BP Readings from Last 1 Encounters:  10/17/20 134/76         Passed - Valid encounter within last 6 months    Recent Outpatient Visits          4 months ago Essential hypertension   [place], MD   7 months ago Essential hypertension   [place], MD   11 months ago Essential hypertension   [facility] Glean Hess, MD   1 year ago Essential hypertension   [facility] Glean Hess, MD   1 year ago Hyperlipidemia, unspecified hyperlipidemia type   [facility] Glean Hess, MD      Future Appointments            In 7 months Army Melia Jesse Sans, MD [facility], [place]           . sertraline (ZOLOFT) 50 MG tablet [Pharmacy Med Name: SERTRALINE 50MG  TABLETS] 90 tablet 1    Sig: TAKE 1 TABLET(50 MG) BY MOUTH DAILY     Psychiatry:  Antidepressants - SSRI Passed - 03/14/2021  7:10 AM      Passed - Completed PHQ-2 or PHQ-9 in the last 360 days      Passed - Valid encounter within last 6 months    Recent Outpatient Visits          4 months ago Essential hypertension   [place], MD   7 months ago Essential hypertension   [place], [place]   11 months ago Essential hypertension   [facility] Glean Hess, MD   1 year ago Essential hypertension   [facility] Glean Hess, MD   1 year ago Hyperlipidemia, unspecified hyperlipidemia type   [facility] Glean Hess, MD      Future Appointments            In 7 months Army Melia Jesse Sans, MD [facility], [place]           . lisinopril-hydrochlorothiazide (ZESTORETIC) 20-12.5 MG tablet [Pharmacy Med Name: LISINOPRIL-HCTZ 20/12.5MG  TABLETS] 180 tablet 1    Sig: TAKE 2 TABLETS BY MOUTH DAILY     Cardiovascular:  ACEI + Diuretic Combos Failed - 03/14/2021  7:10 AM      Failed - Cr in normal range and within 180 days    Creatinine, Ser  Date Value Ref Range Status  10/17/2020 1.33 (H) 0.57 - 1.00 mg/dL Final         Passed - Na in normal range and within 180 days    Sodium  Date Value Ref Range Status  10/17/2020 138 134 - 144 mmol/L Final         Passed - K in normal range and within 180 days    Potassium  Date Value Ref Range Status  10/17/2020 3.7 3.5 - 5.2 mmol/L Final         Passed - Ca in normal range and within 180 days    Calcium  Date Value Ref Range Status  10/17/2020 9.6 8.7 - 10.3 mg/dL Final         Passed - Patient is not pregnant      Passed - Last BP in normal range    BP Readings from Last 1 Encounters:  10/17/20 134/76         Passed - Valid encounter within last 6 months    Recent Outpatient Visits          4 months ago Essential hypertension   [place], MD   7 months ago Essential hypertension   [place], MD   11 months ago Essential hypertension   [facility] Glean Hess, MD   1 year ago Essential hypertension   [facility] Glean Hess, MD   1 year ago Hyperlipidemia, unspecified hyperlipidemia type   [facility] Glean Hess, MD      Future Appointments            In 7 months Army Melia Jesse Sans, MD [facility], [facility]

## 2021-05-20 DIAGNOSIS — Z961 Presence of intraocular lens: Secondary | ICD-10-CM | POA: Diagnosis not present

## 2021-05-20 DIAGNOSIS — H18513 Endothelial corneal dystrophy, bilateral: Secondary | ICD-10-CM | POA: Diagnosis not present

## 2021-05-20 DIAGNOSIS — H35373 Puckering of macula, bilateral: Secondary | ICD-10-CM | POA: Diagnosis not present

## 2021-06-01 ENCOUNTER — Telehealth: Payer: Self-pay | Admitting: Internal Medicine

## 2021-06-01 NOTE — Telephone Encounter (Signed)
Maxie Better calling from [facility], [facility] Case Manager called to report that she is sending a fax for PCP. She is requesting pull ups, inserts for pull ups, and ensure.   Best contact: [phone] Fax: [phone]

## 2021-08-06 IMAGING — CR DG CHEST 2V
2 series · 2 of 2 positions shown · non-contrast
Comparison: None.

CLINICAL DATA: Shortness of breath and edema, swelling of the lower
legs for 2 weeks

EXAM:
CHEST - 2 VIEW

[chest lat]
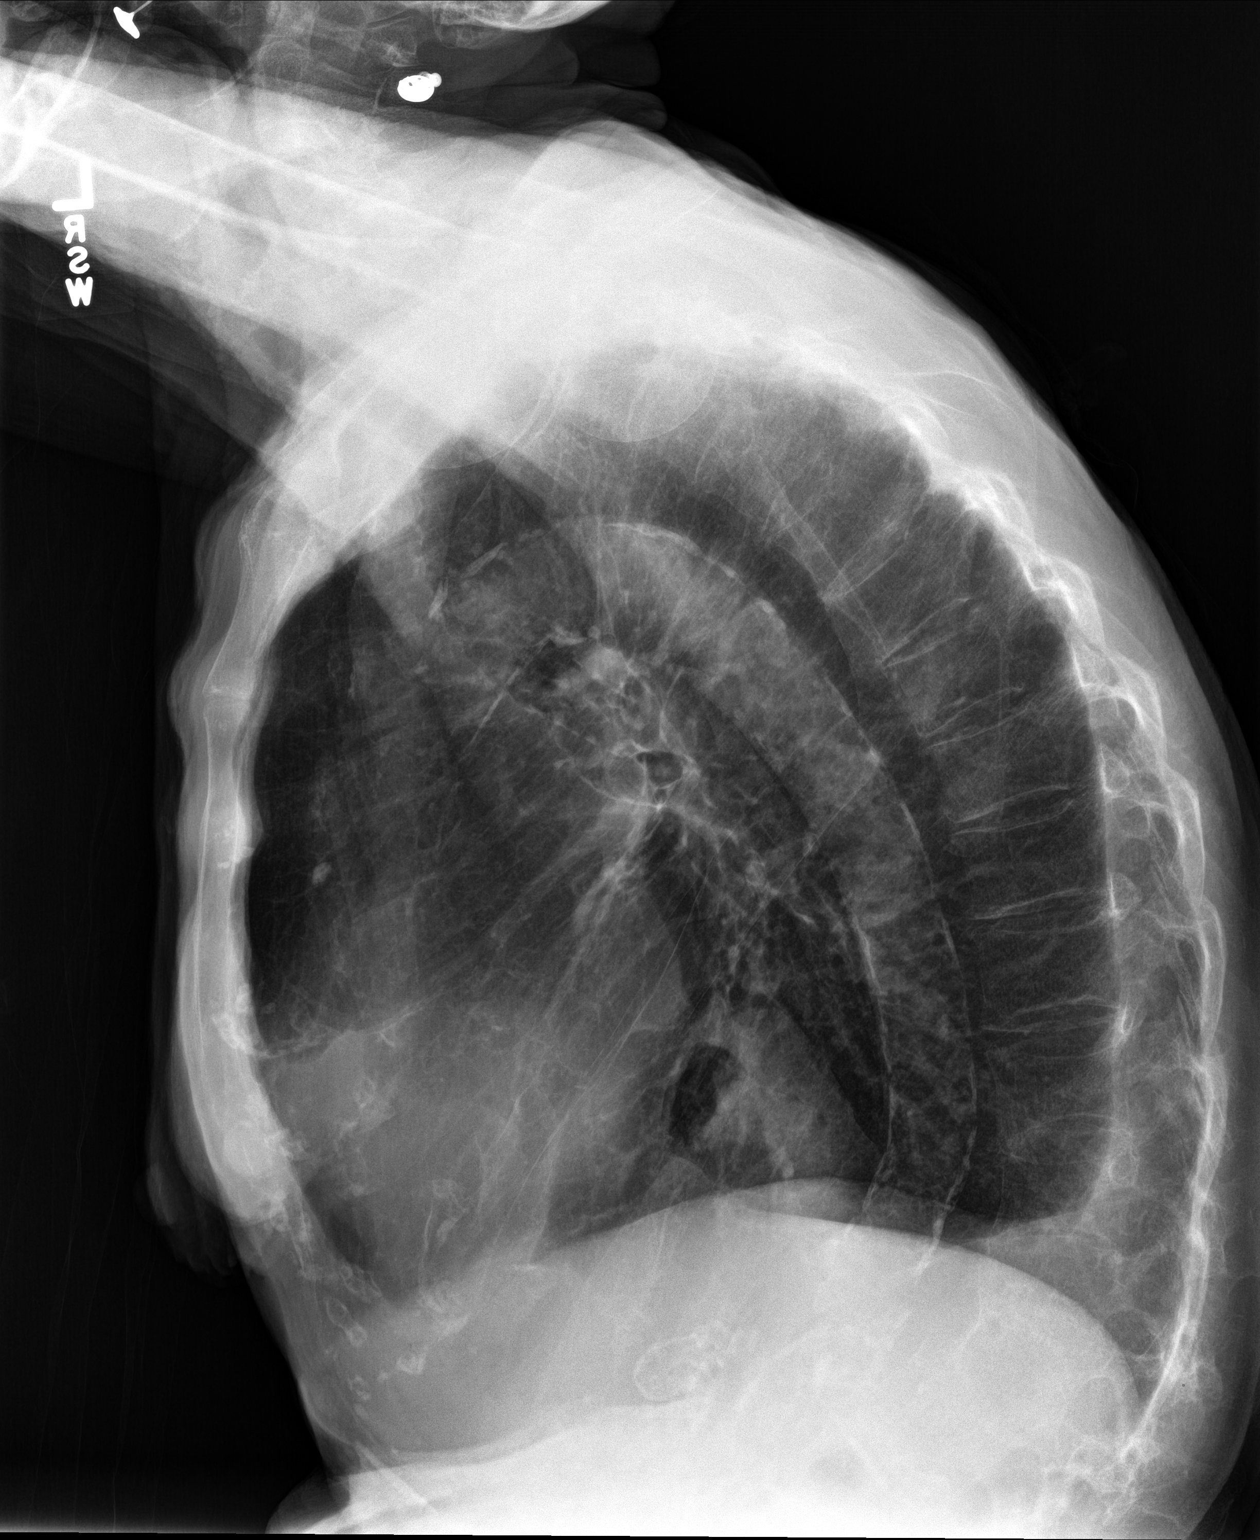

[chest ap]
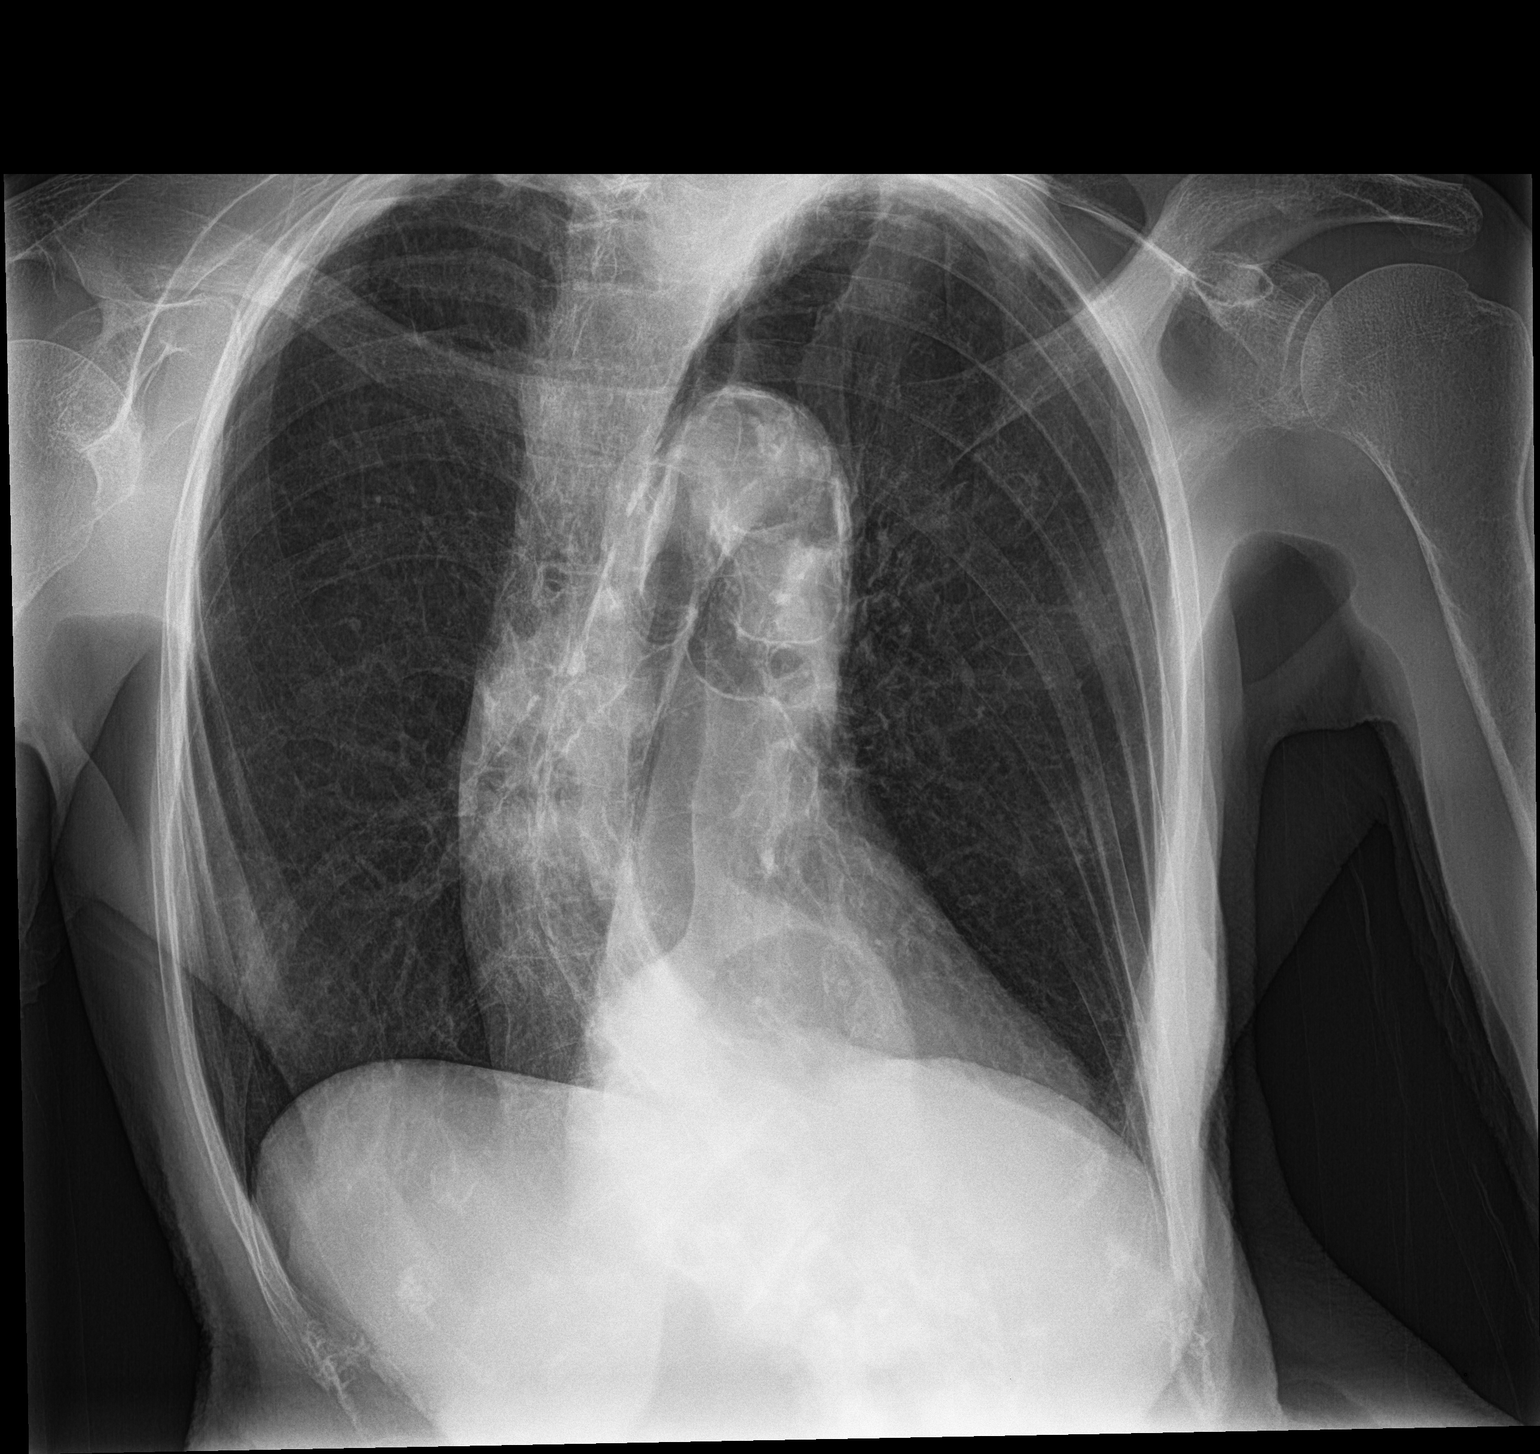

[2 of 2 positions shown; findings below may reference images not displayed]

FINDINGS: Chronic hyperinflation and flattening of the diaphragms with
increased AP diameter of the chest. Dextrocurvature of the spine.
Associated chest wall deformity. Biapical pleuroparenchymal
scarring. No consolidation, features of edema, pneumothorax, or
effusion. The aorta is calcified. The remaining cardiomediastinal
contours are unremarkable.
IMPRESSION: No acute cardiopulmonary abnormality.

Biapical scarring. Hyperinflation of the lungs.

Aortic Atherosclerosis ([id number]).

Dextrocurvature of the spine with chest wall deformity.

## 2021-09-07 ENCOUNTER — Other Ambulatory Visit: Payer: Self-pay | Admitting: Internal Medicine

## 2021-09-07 DIAGNOSIS — K219 Gastro-esophageal reflux disease without esophagitis: Secondary | ICD-10-CM

## 2021-09-21 ENCOUNTER — Other Ambulatory Visit: Payer: Self-pay | Admitting: Internal Medicine

## 2021-09-21 DIAGNOSIS — I10 Essential (primary) hypertension: Secondary | ICD-10-CM

## 2021-09-22 ENCOUNTER — Other Ambulatory Visit: Payer: Self-pay | Admitting: Internal Medicine

## 2021-09-22 DIAGNOSIS — F321 Major depressive disorder, single episode, moderate: Secondary | ICD-10-CM

## 2021-09-22 DIAGNOSIS — I10 Essential (primary) hypertension: Secondary | ICD-10-CM

## 2021-09-23 ENCOUNTER — Other Ambulatory Visit: Payer: Self-pay | Admitting: Internal Medicine

## 2021-09-23 DIAGNOSIS — I10 Essential (primary) hypertension: Secondary | ICD-10-CM

## 2021-09-23 NOTE — Telephone Encounter (Signed)
Courtesy refill. Patient needs to keep upcoming office visit.  ?Requested Prescriptions  ?Pending Prescriptions Disp Refills  ?? sertraline (ZOLOFT) 50 MG tablet [Pharmacy Med Name: SERTRALINE 50MG TABLETS] 30 tablet 0  ?  Sig: TAKE 1 TABLET(50 MG) BY MOUTH DAILY  ?  ? Psychiatry:  Antidepressants - SSRI - sertraline Failed - 09/22/2021  7:02 AM  ?  ?  Failed - Valid encounter within last 6 months  ?  Recent Outpatient Visits   ?      ? 11 months ago Essential hypertension  ? [facility] Berglund, Laura H, MD  ? 1 year ago Essential hypertension  ? [facility] Berglund, Laura H, MD  ? 1 year ago Essential hypertension  ? [facility] Berglund, Laura H, MD  ? 1 year ago Essential hypertension  ? [facility] Berglund, Laura H, MD  ? 2 years ago Hyperlipidemia, unspecified hyperlipidemia type  ? [facility] Berglund, Laura H, MD  ?  ?  ?Future Appointments   ?        ? In 4 weeks Berglund, Laura H, MD [facility], [facility]  ?  ? ?  ?  ?  Passed - AST in normal range and within 360 days  ?  AST  ?Date Value Ref Range Status  ?10/17/2020 29 0 - 40 IU/L Final  ?   ?  ?  Passed - ALT in normal range and within 360 days  ?  ALT  ?Date Value Ref Range Status  ?10/17/2020 23 0 - 32 IU/L Final  ?   ?  ?  Passed - Completed PHQ-2 or PHQ-9 in the last 360 days  ?  ?  ?? lisinopril-hydrochlorothiazide (ZESTORETIC) 20-12.5 MG tablet [Pharmacy Med Name: LISINOPRIL-HCTZ 20/12.5MG TABLETS] 60 tablet 0  ?  Sig: TAKE 2 TABLETS BY MOUTH DAILY  ?  ? Cardiovascular:  ACEI + Diuretic Combos Failed - 09/22/2021  7:02 AM  ?  ?  Failed - Na in normal range and within 180 days  ?  Sodium  ?Date Value Ref Range Status  ?10/17/2020 138 134 - 144 mmol/L Final  ?   ?  ?  Failed - K in normal range and within 180 days  ?  Potassium  ?Date Value Ref Range Status  ?10/17/2020 3.7 3.5 - 5.2 mmol/L Final  ?   ?  ?  Failed - Cr in normal range and within 180 days  ?  Creatinine, Ser  ?Date Value Ref  Range Status  ?10/17/2020 1.33 (H) 0.57 - 1.00 mg/dL Final  ?   ?  ?  Failed - eGFR is 30 or above and within 180 days  ?  GFR calc Af Amer  ?Date Value Ref Range Status  ?09/26/2019 44 (L) >59 mL/min/1.73 Final  ?  Comment:  ?  **[facility] currently reports eGFR in compliance with the current** ?  recommendations of the National Kidney Foundation. [facility] will ?  update reporting as new guidelines are published from the NKF-ASN ?  Task force. ?  ? ?GFR calc non Af Amer  ?Date Value Ref Range Status  ?09/26/2019 38 (L) >59 mL/min/1.73 Final  ? ?eGFR  ?Date Value Ref Range Status  ?10/17/2020 40 (L) >59 mL/min/1.73 Final  ?   ?  ?  Failed - Valid encounter within last 6 months  ?  Recent Outpatient Visits   ?      ? 11 months ago Essential hypertension  ?   [facility] Berglund, Laura H, MD  ? 1 year ago Essential hypertension  ? [facility] Berglund, Laura H, MD  ? 1 year ago Essential hypertension  ? [facility] Berglund, Laura H, MD  ? 1 year ago Essential hypertension  ? [facility] Berglund, Laura H, MD  ? 2 years ago Hyperlipidemia, unspecified hyperlipidemia type  ? [facility] Berglund, Laura H, MD  ?  ?  ?Future Appointments   ?        ? In 4 weeks Berglund, Laura H, MD [facility]  ?  ? ?  ?  ?  Passed - Patient is not pregnant  ?  ?  Passed - Last BP in normal range  ?  BP Readings from Last 1 Encounters:  ?10/17/20 134/76  ?   ?  ?  ? ?

## 2021-09-23 NOTE — Telephone Encounter (Signed)
Receipt confirmed by pharmacy 11:17 today. ?Requested Prescriptions  ?Pending Prescriptions Disp Refills  ?? amLODipine (NORVASC) 10 MG tablet [Pharmacy Med Name: AMLODIPINE BESYLATE 10MG  TABLETS] 90 tablet   ?  Sig: TAKE 1 TABLET(10 MG) BY MOUTH DAILY  ?  ? Cardiovascular: Calcium Channel Blockers 2 Failed - 09/23/2021 11:17 AM  ?  ?  Failed - Valid encounter within last 6 months  ?  Recent Outpatient Visits   ?      ? 11 months ago Essential hypertension  ? [facility] Glean Hess, MD  ? 1 year ago Essential hypertension  ? [facility] Glean Hess, MD  ? 1 year ago Essential hypertension  ? [facility] Glean [facility], [facility]  ? 1 year ago Essential hypertension  ? [facility] Glean Hess, MD  ? 2 years ago Hyperlipidemia, unspecified hyperlipidemia type  ? [facility] Glean Hess, MD  ?  ?  ?Future Appointments   ?        ? In 4 weeks Glean Hess, MD [facility]  ?  ? ?  ?  ?  Passed - Last BP in normal range  ?  BP Readings from Last 1 Encounters:  ?10/17/20 134/76  ?   ?  ?  Passed - Last Heart Rate in normal range  ?  Pulse Readings from Last 1 Encounters:  ?10/17/20 82  ?   ?  ?  ?? lisinopril-hydrochlorothiazide (ZESTORETIC) 20-12.5 MG tablet [Pharmacy Med Name: LISINOPRIL-HCTZ 20/12.5MG  TABLETS] 180 tablet   ?  Sig: TAKE 2 TABLETS BY MOUTH DAILY  ?  ? Cardiovascular:  ACEI + Diuretic Combos Failed - 09/23/2021 11:17 AM  ?  ?  Failed - Na in normal range and within 180 days  ?  Sodium  ?Date Value Ref Range Status  ?10/17/2020 138 134 - 144 mmol/L Final  ?   ?  ?  Failed - K in normal range and within 180 days  ?  Potassium  ?Date Value Ref Range Status  ?10/17/2020 3.7 3.5 - 5.2 mmol/L Final  ?   ?  ?  Failed - Cr in normal range and within 180 days  ?  Creatinine, Ser  ?Date Value Ref Range Status  ?10/17/2020 1.33 (H) 0.57 - 1.00 mg/dL Final  ?   ?  ?  Failed - eGFR is 30 or above and within 180 days  ?  GFR calc Af  Amer  ?Date Value Ref Range Status  ?09/26/2019 44 (L) >59 mL/min/1.73 Final  ?  Comment:  ?  **[facility] currently reports eGFR in compliance with the current** ?  recommendations of the [employer]. [facility] will ?  update reporting as new guidelines are published from the NKF-ASN ?  Task force. ?  ? ?GFR calc non Af Amer  ?Date Value Ref Range Status  ?09/26/2019 38 (L) >59 mL/min/1.73 Final  ? ?eGFR  ?Date Value Ref Range Status  ?10/17/2020 40 (L) >59 mL/min/1.73 Final  ?   ?  ?  Failed - Valid encounter within last 6 months  ?  Recent Outpatient Visits   ?      ? 11 months ago Essential hypertension  ? [facility] Glean Hess, MD  ? 1 year ago Essential hypertension  ? [facility] Glean Hess, MD  ? 1 year ago Essential hypertension  ? [facility] Glean Hess, MD  ?  1 year ago Essential hypertension  ? [facility] Glean Hess, MD  ? 2 years ago Hyperlipidemia, unspecified hyperlipidemia type  ? [facility] Glean Hess, MD  ?  ?  ?Future Appointments   ?        ? In 4 weeks Glean Hess, MD [facility], [place]  ?  ? ?  ?  ?  Passed - Patient is not pregnant  ?  ?  Passed - Last BP in normal range  ?  BP Readings from Last 1 Encounters:  ?10/17/20 134/76  ?   ?  ?  ? ? ?

## 2021-09-23 NOTE — Telephone Encounter (Signed)
Appointment 10/21/21- courtesy 30 day Rx given ?Requested Prescriptions  ?Pending Prescriptions Disp Refills  ?? amLODipine (NORVASC) 10 MG tablet [Pharmacy Med Name: AMLODIPINE BESYLATE '10MG'$  TABLETS] 90 tablet 1  ?  Sig: TAKE 1 TABLET(10 MG) BY MOUTH DAILY  ?  ? Cardiovascular: Calcium Channel Blockers 2 Failed - 09/21/2021  8:45 PM  ?  ?  Failed - Valid encounter within last 6 months  ?  Recent Outpatient Visits   ?      ? 11 months ago Essential hypertension  ? [facility] Glean Hess, MD  ? 1 year ago Essential hypertension  ? [facility] Glean Hess, MD  ? 1 year ago Essential hypertension  ? [facility] Glean Hess, MD  ? 1 year ago Essential hypertension  ? [facility] Glean Hess, MD  ? 2 years ago Hyperlipidemia, unspecified hyperlipidemia type  ? [facility] Glean Hess, MD  ?  ?  ?Future Appointments   ?        ? In 4 weeks Glean Hess, MD [facility]  ?  ? ?  ?  ?  Passed - Last BP in normal range  ?  BP Readings from Last 1 Encounters:  ?10/17/20 134/76  ?   ?  ?  Passed - Last Heart Rate in normal range  ?  Pulse Readings from Last 1 Encounters:  ?10/17/20 82  ?   ?  ?  ? ?

## 2021-09-25 ENCOUNTER — Other Ambulatory Visit: Payer: Self-pay | Admitting: Internal Medicine

## 2021-09-25 DIAGNOSIS — E785 Hyperlipidemia, unspecified: Secondary | ICD-10-CM

## 2021-09-25 NOTE — Telephone Encounter (Signed)
Future visit in 3 weeks .  Requested Prescriptions  Pending Prescriptions Disp Refills  . atorvastatin (LIPITOR) 40 MG tablet [Pharmacy Med Name: ATORVASTATIN '40MG'$  TABLETS] 90 tablet 0    Sig: TAKE 1 TABLET(40 MG) BY MOUTH DAILY     Cardiovascular:  Antilipid - Statins Failed - 09/25/2021  8:50 AM      Failed - Lipid Panel in normal range within the last 12 months    Cholesterol, Total  Date Value Ref Range Status  10/17/2020 162 100 - 199 mg/dL Final   LDL Chol Calc (NIH)  Date Value Ref Range Status  10/17/2020 80 0 - 99 mg/dL Final   HDL  Date Value Ref Range Status  10/17/2020 71 >39 mg/dL Final   Triglycerides  Date Value Ref Range Status  10/17/2020 52 0 - 149 mg/dL Final         Passed - Patient is not pregnant      Passed - Valid encounter within last 12 months    Recent Outpatient Visits          11 months ago Essential hypertension   [place], Laura H, MD   1 year ago Essential hypertension   [place], Laura H, MD   1 year ago Essential hypertension   [facility] Glean Hess, MD   2 years ago Essential hypertension   [facility] Glean Hess, MD   2 years ago Hyperlipidemia, unspecified hyperlipidemia type   [facility] Glean Hess, MD      Future Appointments            In 3 weeks Army Melia Jesse Sans, MD [facility]

## 2021-10-21 ENCOUNTER — Encounter: Payer: Medicare Other | Admitting: Internal Medicine

## 2021-10-21 NOTE — Progress Notes (Deleted)
Date:  10/21/2021   Name:  Vanessa Farmer   DOB:  November 24, 1935   MRN:  [id number]   Chief Complaint: No chief complaint on file. Vanessa Farmer is a 86 y.o. female who presents today for her Complete Annual Exam. She feels {DESC; WELL/FAIRLY WELL/POORLY:18703}. She reports exercising ***. She reports she is sleeping {DESC; WELL/FAIRLY WELL/POORLY:18703}. Breast complaints ***.  Mammogram: discontinued DEXA: 2014 Pap smear: discontinued Colonoscopy: discontinued  Health Maintenance Due  Topic Date Due   Zoster Vaccines- Shingrix (1 of 2) Never done    Immunization History  Administered Date(s) Administered   Fluad Quad(high Dose 65+) 03/20/2019, 03/21/2020   Influenza, High Dose Seasonal PF 01/31/2017   Pneumococcal Conjugate-13 06/07/2016   Pneumococcal Polysaccharide-23 01/17/2018   Tdap 06/07/2016    Hypertension This is a chronic problem. The problem is controlled. Pertinent negatives include no chest pain, headaches, palpitations or shortness of breath. Past treatments include calcium channel blockers, ACE inhibitors and diuretics. The current treatment provides significant improvement.  Depression        This is a chronic problem.The problem is unchanged.  Associated symptoms include no fatigue and no headaches.  Past treatments include SSRIs - Selective serotonin reuptake inhibitors. Gastroesophageal Reflux She complains of heartburn. She reports no abdominal pain, no chest pain, no coughing or no wheezing. This is a recurrent problem. The problem occurs rarely. Pertinent negatives include no fatigue. She has tried a PPI for the symptoms. The treatment provided significant relief.  Hyperlipidemia This is a chronic problem. The problem is controlled. Pertinent negatives include no chest pain or shortness of breath. Current antihyperlipidemic treatment includes statins.    Lab Results  Component Value Date   NA 138 10/17/2020   K 3.7 10/17/2020   CO2 25 10/17/2020    GLUCOSE 101 (H) 10/17/2020   BUN 38 (H) 10/17/2020   CREATININE 1.33 (H) 10/17/2020   CALCIUM 9.6 10/17/2020   EGFR 40 (L) 10/17/2020   GFRNONAA 38 (L) 09/26/2019   Lab Results  Component Value Date   CHOL 162 10/17/2020   HDL 71 10/17/2020   LDLCALC 80 10/17/2020   TRIG 52 10/17/2020   CHOLHDL 2.3 10/17/2020   Lab Results  Component Value Date   TSH 2.000 10/17/2020   No results found for: "HGBA1C" Lab Results  Component Value Date   WBC 3.9 10/17/2020   HGB 11.4 10/17/2020   HCT 32.8 (L) 10/17/2020   MCV 93 10/17/2020   PLT 170 10/17/2020   Lab Results  Component Value Date   ALT 23 10/17/2020   AST 29 10/17/2020   ALKPHOS 96 10/17/2020   BILITOT 0.4 10/17/2020   Lab Results  Component Value Date   VD25OH 43.1 06/10/2017     Review of Systems  Constitutional:  Negative for chills, fatigue and fever.  HENT:  Negative for congestion, hearing loss, tinnitus, trouble swallowing and voice change.   Eyes:  Negative for visual disturbance.  Respiratory:  Negative for cough, chest tightness, shortness of breath and wheezing.   Cardiovascular:  Negative for chest pain, palpitations and leg swelling.  Gastrointestinal:  Positive for heartburn. Negative for abdominal pain, constipation, diarrhea and vomiting.  Endocrine: Negative for polydipsia and polyuria.  Genitourinary:  Negative for dysuria, frequency, genital sores, vaginal bleeding and vaginal discharge.  Musculoskeletal:  Negative for arthralgias, gait problem and joint swelling.  Skin:  Negative for color change and rash.  Neurological:  Negative for dizziness, tremors, light-headedness and headaches.  Hematological:  Negative for adenopathy. Does not bruise/bleed easily.  Psychiatric/Behavioral:  Positive for depression. Negative for dysphoric mood and sleep disturbance. The patient is not nervous/anxious.     Patient Active Problem List   Diagnosis Date Noted   Aortic atherosclerosis ([place]) 10/20/2020    Ganglion cyst of wrist, right 03/20/2019   Thrombocytopenia ([place]) 07/19/2018   Iron deficiency anemia    Polyp of sigmoid colon    Pancytopenia ([place]) 09/30/2017   GERD (gastroesophageal reflux disease) 06/10/2017   CKD (chronic kidney disease) stage 3, GFR 30-59 ml/min ([place]) 06/08/2016   Hyperlipidemia 06/08/2016   Scoliosis 06/07/2016   Osteoporosis 06/07/2016   Essential hypertension 06/07/2016   CAD (coronary artery disease) 06/07/2016   Depression, major, single episode, moderate ([place]) 06/07/2016    No Known Allergies  Past Surgical History:  Procedure Laterality Date   ABDOMINAL HYSTERECTOMY     CATARACT EXTRACTION, BILATERAL  2021   COLONOSCOPY WITH PROPOFOL N/A 12/05/2017   Procedure: COLONOSCOPY WITH PROPOFOL;  Surgeon: Lucilla Lame, MD;  Location: [place];  Service: Endoscopy;  Laterality: N/A;   ESOPHAGOGASTRODUODENOSCOPY (EGD) WITH PROPOFOL N/A 12/05/2017   Procedure: ESOPHAGOGASTRODUODENOSCOPY (EGD) WITH PROPOFOL;  Surgeon: Lucilla Lame, MD;  Location: [place];  Service: Endoscopy;  Laterality: N/A;   POLYPECTOMY  12/05/2017   Procedure: POLYPECTOMY;  Surgeon: Lucilla Lame, MD;  [facility];  Service: Endoscopy;;   TONSILLECTOMY      Social History   Tobacco Use   Smoking status: Never   Smokeless tobacco: Never   Tobacco comments:    smoking cessation materials not required  Vaping Use   Vaping Use: Never used  Substance Use Topics   Alcohol use: No   Drug use: No     Medication list has been reviewed and updated.  No outpatient medications have been marked as taking for the 10/21/21 encounter (Appointment) with Glean Hess, MD.       01/24/2021    2:24 PM 10/17/2020    8:11 AM 07/28/2020    3:08 PM 03/21/2020    3:40 PM  GAD 7 : Generalized Anxiety Score  Nervous, Anxious, on Edge 1 0 0 0  Control/stop worrying 2 0 0 0  Worry too much - different things 2 0 0 0  Trouble relaxing 0 0 0 0  Restless 0 0 0  0  Easily annoyed or irritable 1 0 0 0  Afraid - awful might happen 0 0 0 0  Total GAD 7 Score 6 0 0 0  Anxiety Difficulty Not difficult at all          01/24/2021    2:19 PM  Depression screen PHQ 2/9  Decreased Interest 1  Down, Depressed, Hopeless 0  PHQ - 2 Score 1  Altered sleeping 0  Tired, decreased energy 0  Change in appetite 0  Feeling bad or failure about yourself  1  Trouble concentrating 0  Moving slowly or fidgety/restless 0  Suicidal thoughts 0  PHQ-9 Score 2  Difficult doing work/chores Not difficult at all    BP Readings from Last 3 Encounters:  10/17/20 134/76  07/28/20 112/78  03/21/20 (!) 136/50    Physical Exam Vitals and nursing note reviewed.  Constitutional:      General: She is not in acute distress.    Appearance: She is well-developed.  HENT:     Head: Normocephalic and atraumatic.     Right Ear: Tympanic membrane and ear canal normal.  Left Ear: Tympanic membrane and ear canal normal.     Nose:     Right Sinus: No maxillary sinus tenderness.     Left Sinus: No maxillary sinus tenderness.  Eyes:     General: No scleral icterus.       Right eye: No discharge.        Left eye: No discharge.     Conjunctiva/sclera: Conjunctivae normal.  Neck:     Thyroid: No thyromegaly.     Vascular: No carotid bruit.  Cardiovascular:     Rate and Rhythm: Normal rate and regular rhythm.     Pulses: Normal pulses.     Heart sounds: Normal heart sounds.  Pulmonary:     Effort: Pulmonary effort is normal. No respiratory distress.     Breath sounds: No wheezing.  Chest:  Breasts:    Right: No mass, nipple discharge, skin change or tenderness.     Left: No mass, nipple discharge, skin change or tenderness.  Abdominal:     General: Bowel sounds are normal.     Palpations: Abdomen is soft.     Tenderness: There is no abdominal tenderness.  Musculoskeletal:     Cervical back: Normal range of motion. No erythema.     Right lower leg: No edema.      Left lower leg: No edema.  Lymphadenopathy:     Cervical: No cervical adenopathy.  Skin:    General: Skin is warm and dry.     Findings: No rash.  Neurological:     Mental Status: She is alert and oriented to person, place, and time.     Cranial Nerves: No cranial nerve deficit.     Sensory: No sensory deficit.     Deep Tendon Reflexes: Reflexes are normal and symmetric.  Psychiatric:        Attention and Perception: Attention normal.        Mood and Affect: Mood normal.     Wt Readings from Last 3 Encounters:  10/17/20 103 lb (46.7 kg)  07/28/20 105 lb (47.6 kg)  03/21/20 110 lb (49.9 kg)    There were no vitals taken for this visit.  Assessment and Plan:

## 2021-10-22 ENCOUNTER — Other Ambulatory Visit: Payer: Self-pay | Admitting: Internal Medicine

## 2021-10-22 DIAGNOSIS — I10 Essential (primary) hypertension: Secondary | ICD-10-CM

## 2021-10-22 DIAGNOSIS — F321 Major depressive disorder, single episode, moderate: Secondary | ICD-10-CM

## 2021-10-22 NOTE — Telephone Encounter (Signed)
Requested medication (s) are due for refill today: yes  Requested medication (s) are on the active medication list: yes  Last refill:  09/23/21 Norvasc #30 with 0 RF, Zoloft #30 with 0 RF, Zestoretic #60 with 0 RF  Future visit scheduled: no, last visit 10/17/2020  Notes to clinic:  Pt has had 5 No Shows including yesterday. Unsure what the situation is, one note seemed like she is in a facility. Unsure if scheduling another appt was going to help or just another no show, please assess.      Requested Prescriptions  Pending Prescriptions Disp Refills   amLODipine (NORVASC) 10 MG tablet [Pharmacy Med Name: AMLODIPINE BESYLATE 10MG TABLETS] 90 tablet     Sig: TAKE 1 TABLET(10 MG) BY MOUTH DAILY     Cardiovascular: Calcium Channel Blockers 2 Failed - 10/22/2021  7:04 AM      Failed - Valid encounter within last 6 months    Recent Outpatient Visits           1 year ago Essential hypertension   [facility] Glean Hess, MD   1 year ago Essential hypertension   [facility] Glean Hess, MD   1 year ago Essential hypertension   [facility] Glean Hess, MD   2 years ago Essential hypertension   [facility] Glean Hess, MD   2 years ago Hyperlipidemia, unspecified hyperlipidemia type   [facility] Glean Hess, MD              Passed - Last BP in normal range    BP Readings from Last 1 Encounters:  10/17/20 134/76         Passed - Last Heart Rate in normal range    Pulse Readings from Last 1 Encounters:  10/17/20 82          sertraline (ZOLOFT) 50 MG tablet [Pharmacy Med Name: SERTRALINE 50MG TABLETS] 30 tablet 0    Sig: TAKE 1 TABLET(50 MG) BY MOUTH DAILY     Psychiatry:  Antidepressants - SSRI - sertraline Failed - 10/22/2021  7:04 AM      Failed - AST in normal range and within 360 days    AST  Date Value Ref Range Status  10/17/2020 29 0 - 40 IU/L Final         Failed - ALT in normal  range and within 360 days    ALT  Date Value Ref Range Status  10/17/2020 23 0 - 32 IU/L Final         Failed - Valid encounter within last 6 months    Recent Outpatient Visits           1 year ago Essential hypertension   [facility] Glean Hess, MD   1 year ago Essential hypertension   [facility] Glean Hess, MD   1 year ago Essential hypertension   [facility] Glean Hess, MD   2 years ago Essential hypertension   [facility] Glean Hess, [facility]   2 years ago Hyperlipidemia, unspecified hyperlipidemia type   [facility] Glean Hess, MD              Passed - Completed PHQ-2 or PHQ-9 in the last 360 days       lisinopril-hydrochlorothiazide (ZESTORETIC) 20-12.5 MG tablet [Pharmacy Med Name: LISINOPRIL-HCTZ 20/12.5MG TABLETS] 180 tablet     Sig: TAKE 2 TABLETS BY  MOUTH DAILY     Cardiovascular:  ACEI + Diuretic Combos Failed - 10/22/2021  7:04 AM      Failed - Na in normal range and within 180 days    Sodium  Date Value Ref Range Status  10/17/2020 138 134 - 144 mmol/L Final         Failed - K in normal range and within 180 days    Potassium  Date Value Ref Range Status  10/17/2020 3.7 3.5 - 5.2 mmol/L Final         Failed - Cr in normal range and within 180 days    Creatinine, Ser  Date Value Ref Range Status  10/17/2020 1.33 (H) 0.57 - 1.00 mg/dL Final         Failed - eGFR is 30 or above and within 180 days    GFR calc Af Amer  Date Value Ref Range Status  09/26/2019 44 (L) >59 mL/min/1.73 Final    Comment:    **[facility] currently reports eGFR in compliance with the current**   recommendations of the [employer]. [facility] will   update reporting as new guidelines are published from the NKF-ASN   Task force.    GFR calc non Af Amer  Date Value Ref Range Status  09/26/2019 38 (L) >59 mL/min/1.73 Final   eGFR  Date Value Ref Range Status  10/17/2020 40 (L) >59  mL/min/1.73 Final         Failed - Valid encounter within last 6 months    Recent Outpatient Visits           1 year ago Essential hypertension   [facility] Glean Hess, MD   1 year ago Essential hypertension   [facility] Glean Hess, MD   1 year ago Essential hypertension   [facility] Glean Hess, MD   2 years ago Essential hypertension   [facility] Glean Hess, MD   2 years ago Hyperlipidemia, unspecified hyperlipidemia type   [facility] Glean Hess, MD              Passed - Patient is not pregnant      Passed - Last BP in normal range    BP Readings from Last 1 Encounters:  10/17/20 134/76

## 2021-11-04 ENCOUNTER — Other Ambulatory Visit: Payer: Self-pay | Admitting: Internal Medicine

## 2021-11-04 DIAGNOSIS — I10 Essential (primary) hypertension: Secondary | ICD-10-CM

## 2021-11-04 NOTE — Telephone Encounter (Signed)
Requested medication (s) are due for refill today: yes  Requested medication (s) are on the active medication list: yes  Last refill:  09/23/21  Future visit scheduled: no  Notes to clinic:  Unable to refill per protocol, courtesy refill already given, routing for provider approval.      Requested Prescriptions  Pending Prescriptions Disp Refills   lisinopril-hydrochlorothiazide (ZESTORETIC) 20-12.5 MG tablet [Pharmacy Med Name: LISINOPRIL-HCTZ 20/12.5MG  TABLETS] 180 tablet     Sig: TAKE 2 TABLETS BY MOUTH DAILY     Cardiovascular:  ACEI + Diuretic Combos Failed - 11/04/2021 11:12 AM      Failed - Na in normal range and within 180 days    Sodium  Date Value Ref Range Status  10/17/2020 138 134 - 144 mmol/L Final         Failed - K in normal range and within 180 days    Potassium  Date Value Ref Range Status  10/17/2020 3.7 3.5 - 5.2 mmol/L Final         Failed - Cr in normal range and within 180 days    Creatinine, Ser  Date Value Ref Range Status  10/17/2020 1.33 (H) 0.57 - 1.00 mg/dL Final         Failed - eGFR is 30 or above and within 180 days    GFR calc Af Amer  Date Value Ref Range Status  09/26/2019 44 (L) >59 mL/min/1.73 Final    Comment:    **[facility] currently reports eGFR in compliance with the current**   recommendations of the [employer]. [facility] will   update reporting as new guidelines are published from the NKF-ASN   Task force.    GFR calc non Af Amer  Date Value Ref Range Status  09/26/2019 38 (L) >59 mL/min/1.73 Final   eGFR  Date Value Ref Range Status  10/17/2020 40 (L) >59 mL/min/1.73 Final         Failed - Valid encounter within last 6 months    Recent Outpatient Visits           1 year ago Essential hypertension   [facility] Glean Hess, MD   1 year ago Essential hypertension   [facility] Glean Hess, MD   1 year ago Essential hypertension   [facility] Glean Hess, MD   2 years ago Essential hypertension   [facility] Glean Hess, MD   2 years ago Hyperlipidemia, unspecified hyperlipidemia type   [facility] Glean Hess, MD              Passed - Patient is not pregnant      Passed - Last BP in normal range    BP Readings from Last 1 Encounters:  10/17/20 134/76

## 2021-12-23 ENCOUNTER — Other Ambulatory Visit: Payer: Self-pay | Admitting: Internal Medicine

## 2021-12-23 DIAGNOSIS — E785 Hyperlipidemia, unspecified: Secondary | ICD-10-CM

## 2021-12-23 DIAGNOSIS — I10 Essential (primary) hypertension: Secondary | ICD-10-CM

## 2021-12-24 NOTE — Telephone Encounter (Signed)
Requested medication (s) are due for refill today:  yes all meds  Requested medication (s) are on the active medication list: yes     Last refill:   Future visit scheduled no  Notes to clinic: Pt has New pt appt  06/03/22 with Odette Fraction at [facility]. Pts last OV with Dr. Army Melia 01/24/21. Will Dr. Army Melia refill until appt as new pt.with Jolene?  Requested Prescriptions  Pending Prescriptions Disp Refills   atorvastatin (LIPITOR) 40 MG tablet [Pharmacy Med Name: ATORVASTATIN 40MG  TABLETS] 90 tablet 0    Sig: TAKE 1 TABLET(40 MG) BY MOUTH DAILY     Cardiovascular:  Antilipid - Statins Failed - 12/23/2021  7:44 PM      Failed - Valid encounter within last 12 months    Recent Outpatient Visits           1 year ago Essential hypertension   [place] [facility] at [facility], Jesse Sans, MD   1 year ago Essential hypertension   [place] [facility] at [facility], Jesse Sans, MD   1 year ago Essential hypertension   [place] [facility] at [facility], Jesse Sans, MD   2 years ago Essential hypertension   [place] [facility] at [facility], Jesse Sans, MD   2 years ago Hyperlipidemia, unspecified hyperlipidemia type   [place] [facility] at [facility], Jesse Sans, MD       Future Appointments             In 5 months [place], Barbaraann Faster, NP [facility], [facility]            Failed - Lipid Panel in normal range within the last 12 months    Cholesterol, Total  Date Value Ref Range Status  10/17/2020 162 100 - 199 mg/dL Final   LDL Chol Calc (NIH)  Date Value Ref Range Status  10/17/2020 80 0 - 99 mg/dL Final   HDL  Date Value Ref Range Status  10/17/2020 71 >39 mg/dL Final   Triglycerides  Date Value Ref Range Status  10/17/2020 52 0 - 149 mg/dL Final         Passed - Patient is not  pregnant       lisinopril-hydrochlorothiazide (ZESTORETIC) 20-12.5 MG tablet [Pharmacy Med Name: LISINOPRIL-HCTZ 20/12.5MG  TABLETS] 180 tablet     Sig: TAKE 2 TABLETS BY MOUTH DAILY     Cardiovascular:  ACEI + Diuretic Combos Failed - 12/23/2021  7:44 PM      Failed - Na in normal range and within 180 days    Sodium  Date Value Ref Range Status  10/17/2020 138 134 - 144 mmol/L Final         Failed - K in normal range and within 180 days    Potassium  Date Value Ref Range Status  10/17/2020 3.7 3.5 - 5.2 mmol/L Final         Failed - Cr in normal range and within 180 days    Creatinine, Ser  Date Value Ref Range Status  10/17/2020 1.33 (H) 0.57 - 1.00 mg/dL Final         Failed - eGFR is 30 or above and within 180 days    GFR calc Af Amer  Date Value Ref Range Status  09/26/2019 44 (L) >59 mL/min/1.73 Final    Comment:    **  [facility] currently reports eGFR in compliance with the current**   recommendations of the [employer]. [facility] will   update reporting as new guidelines are published from the NKF-ASN   Task force.    GFR calc non Af Amer  Date Value Ref Range Status  09/26/2019 38 (L) >59 mL/min/1.73 Final   eGFR  Date Value Ref Range Status  10/17/2020 40 (L) >59 mL/min/1.73 Final         Failed - Valid encounter within last 6 months    Recent Outpatient Visits           1 year ago Essential hypertension   [place] [facility] at [facility], Jesse Sans, MD   1 year ago Essential hypertension   [place] [facility] at [facility], Jesse Sans, MD   1 year ago Essential hypertension   [place] [facility] at [facility], Jesse Sans, MD   2 years ago Essential hypertension   [place] [facility] at [facility], Jesse Sans, MD   2 years ago Hyperlipidemia, unspecified hyperlipidemia type   [facility]  [facility] and [facility] Medicine at [facility], Jesse Sans, MD       Future Appointments             In 5 months [place], Barbaraann Faster, NP [facility], [place] - Patient is not pregnant      Passed - Last BP in normal range    BP Readings from Last 1 Encounters:  10/17/20 134/76          amLODipine (NORVASC) 10 MG tablet [Pharmacy Med Name: AMLODIPINE BESYLATE 10MG  TABLETS] 90 tablet     Sig: TAKE 1 TABLET(10 MG) BY MOUTH DAILY     Cardiovascular: Calcium Channel Blockers 2 Failed - 12/23/2021  7:44 PM      Failed - Valid encounter within last 6 months    Recent Outpatient Visits           1 year ago Essential hypertension   [place] [facility] at [facility], Jesse Sans, MD   1 year ago Essential hypertension   [place] and [facility] Medicine at [facility], Jesse Sans, MD   1 year ago Essential hypertension   [place] [facility] at [facility], Jesse Sans, MD   2 years ago Essential hypertension   [place] [facility] at [facility], Jesse Sans, MD   2 years ago Hyperlipidemia, unspecified hyperlipidemia type   [facility] at [facility], Jesse Sans, MD       Future Appointments             In 5 months [place], Barbaraann Faster, NP [facility], [facility]            Passed - Last BP in normal range    BP Readings from Last 1 Encounters:  10/17/20 134/76         Passed - Last Heart Rate in normal range    Pulse Readings from Last 1 Encounters:  10/17/20 82

## 2021-12-30 ENCOUNTER — Emergency Department: Payer: Medicare Other

## 2021-12-30 ENCOUNTER — Encounter: Payer: Self-pay | Admitting: Emergency Medicine

## 2021-12-30 ENCOUNTER — Inpatient Hospital Stay: Payer: Medicare Other

## 2021-12-30 ENCOUNTER — Inpatient Hospital Stay
Admission: EM | Admit: 2021-12-30 | Discharge: 2022-01-02 | DRG: 291 | Disposition: A | Discharge status: Home / Self Care | Payer: Medicare Other | Attending: Internal Medicine | Admitting: Internal Medicine

## 2021-12-30 ENCOUNTER — Other Ambulatory Visit: Payer: Self-pay

## 2021-12-30 DIAGNOSIS — I5033 Acute on chronic diastolic (congestive) heart failure: Secondary | ICD-10-CM

## 2021-12-30 DIAGNOSIS — R600 Localized edema: Secondary | ICD-10-CM | POA: Diagnosis not present

## 2021-12-30 DIAGNOSIS — E876 Hypokalemia: Secondary | ICD-10-CM | POA: Diagnosis not present

## 2021-12-30 DIAGNOSIS — S2241XA Multiple fractures of ribs, right side, initial encounter for closed fracture: Secondary | ICD-10-CM | POA: Diagnosis present

## 2021-12-30 DIAGNOSIS — I252 Old myocardial infarction: Secondary | ICD-10-CM

## 2021-12-30 DIAGNOSIS — W19XXXA Unspecified fall, initial encounter: Secondary | ICD-10-CM | POA: Diagnosis present

## 2021-12-30 DIAGNOSIS — I509 Heart failure, unspecified: Secondary | ICD-10-CM | POA: Diagnosis not present

## 2021-12-30 DIAGNOSIS — J449 Chronic obstructive pulmonary disease, unspecified: Secondary | ICD-10-CM | POA: Diagnosis present

## 2021-12-30 DIAGNOSIS — I824Z3 Acute embolism and thrombosis of unspecified deep veins of distal lower extremity, bilateral: Secondary | ICD-10-CM | POA: Diagnosis not present

## 2021-12-30 DIAGNOSIS — I5031 Acute diastolic (congestive) heart failure: Secondary | ICD-10-CM

## 2021-12-30 DIAGNOSIS — F32A Depression, unspecified: Secondary | ICD-10-CM | POA: Diagnosis present

## 2021-12-30 DIAGNOSIS — N390 Urinary tract infection, site not specified: Secondary | ICD-10-CM | POA: Diagnosis not present

## 2021-12-30 DIAGNOSIS — J90 Pleural effusion, not elsewhere classified: Secondary | ICD-10-CM | POA: Diagnosis not present

## 2021-12-30 DIAGNOSIS — I110 Hypertensive heart disease with heart failure: Secondary | ICD-10-CM | POA: Diagnosis not present

## 2021-12-30 DIAGNOSIS — I2510 Atherosclerotic heart disease of native coronary artery without angina pectoris: Secondary | ICD-10-CM | POA: Diagnosis not present

## 2021-12-30 DIAGNOSIS — I82453 Acute embolism and thrombosis of peroneal vein, bilateral: Secondary | ICD-10-CM | POA: Diagnosis present

## 2021-12-30 DIAGNOSIS — R609 Edema, unspecified: Principal | ICD-10-CM

## 2021-12-30 DIAGNOSIS — K449 Diaphragmatic hernia without obstruction or gangrene: Secondary | ICD-10-CM | POA: Diagnosis not present

## 2021-12-30 DIAGNOSIS — I10 Essential (primary) hypertension: Secondary | ICD-10-CM | POA: Diagnosis not present

## 2021-12-30 DIAGNOSIS — R0781 Pleurodynia: Secondary | ICD-10-CM | POA: Diagnosis not present

## 2021-12-30 DIAGNOSIS — S2231XA Fracture of one rib, right side, initial encounter for closed fracture: Secondary | ICD-10-CM

## 2021-12-30 DIAGNOSIS — Z66 Do not resuscitate: Secondary | ICD-10-CM | POA: Diagnosis present

## 2021-12-30 DIAGNOSIS — I491 Atrial premature depolarization: Secondary | ICD-10-CM | POA: Diagnosis present

## 2021-12-30 DIAGNOSIS — R0602 Shortness of breath: Secondary | ICD-10-CM | POA: Diagnosis not present

## 2021-12-30 DIAGNOSIS — E785 Hyperlipidemia, unspecified: Secondary | ICD-10-CM | POA: Diagnosis present

## 2021-12-30 DIAGNOSIS — Z808 Family history of malignant neoplasm of other organs or systems: Secondary | ICD-10-CM

## 2021-12-30 DIAGNOSIS — Z8249 Family history of ischemic heart disease and other diseases of the circulatory system: Secondary | ICD-10-CM

## 2021-12-30 DIAGNOSIS — K219 Gastro-esophageal reflux disease without esophagitis: Secondary | ICD-10-CM | POA: Diagnosis present

## 2021-12-30 DIAGNOSIS — R9431 Abnormal electrocardiogram [ECG] [EKG]: Secondary | ICD-10-CM | POA: Diagnosis not present

## 2021-12-30 LAB — CBC WITH DIFFERENTIAL/PLATELET
Abs Immature Granulocytes: 0.01 10*3/uL (ref 0.00–0.07)
Basophils Absolute: 0 10*3/uL (ref 0.0–0.1)
Basophils Relative: 0 %
Eosinophils Absolute: 0.1 10*3/uL (ref 0.0–0.5)
Eosinophils Relative: 3 %
HCT: 34.9 % — ABNORMAL LOW (ref 36.0–46.0)
Hemoglobin: 11.3 g/dL — ABNORMAL LOW (ref 12.0–15.0)
Immature Granulocytes: 0 %
Lymphocytes Relative: 21 %
Lymphs Abs: 0.8 10*3/uL (ref 0.7–4.0)
MCH: 32.8 pg (ref 26.0–34.0)
MCHC: 32.4 g/dL (ref 30.0–36.0)
MCV: 101.2 fL — ABNORMAL HIGH (ref 80.0–100.0)
Monocytes Absolute: 0.3 10*3/uL (ref 0.1–1.0)
Monocytes Relative: 9 %
Neutro Abs: 2.5 10*3/uL (ref 1.7–7.7)
Neutrophils Relative %: 67 %
Platelets: 159 10*3/uL (ref 150–400)
RBC: 3.45 MIL/uL — ABNORMAL LOW (ref 3.87–5.11)
RDW: 13 % (ref 11.5–15.5)
WBC: 3.7 10*3/uL — ABNORMAL LOW (ref 4.0–10.5)
nRBC: 0 % (ref 0.0–0.2)

## 2021-12-30 LAB — HEPATIC FUNCTION PANEL
ALT: 21 U/L (ref 0–44)
AST: 24 U/L (ref 15–41)
Albumin: 3.4 g/dL — ABNORMAL LOW (ref 3.5–5.0)
Alkaline Phosphatase: 69 U/L (ref 38–126)
Bilirubin, Direct: 0.1 mg/dL (ref 0.0–0.2)
Indirect Bilirubin: 0.7 mg/dL (ref 0.3–0.9)
Total Bilirubin: 0.8 mg/dL (ref 0.3–1.2)
Total Protein: 6.5 g/dL (ref 6.5–8.1)

## 2021-12-30 LAB — BASIC METABOLIC PANEL
Anion gap: 7 (ref 5–15)
BUN: 14 mg/dL (ref 8–23)
CO2: 29 mmol/L (ref 22–32)
Calcium: 8.8 mg/dL — ABNORMAL LOW (ref 8.9–10.3)
Chloride: 100 mmol/L (ref 98–111)
Creatinine, Ser: 0.98 mg/dL (ref 0.44–1.00)
GFR, Estimated: 57 mL/min — ABNORMAL LOW (ref >60)
Glucose, Bld: 101 mg/dL — ABNORMAL HIGH (ref 70–99)
Potassium: 3.2 mmol/L — ABNORMAL LOW (ref 3.5–5.1)
Sodium: 136 mmol/L (ref 135–145)

## 2021-12-30 LAB — TROPONIN I (HIGH SENSITIVITY): Troponin I (High Sensitivity): 12 ng/L (ref <18)

## 2021-12-30 LAB — URINALYSIS, ROUTINE W REFLEX MICROSCOPIC
Bacteria, UA: NONE SEEN
Bilirubin Urine: NEGATIVE
Glucose, UA: NEGATIVE mg/dL
Hgb urine dipstick: NEGATIVE
Ketones, ur: NEGATIVE mg/dL
Nitrite: NEGATIVE
Protein, ur: NEGATIVE mg/dL
Specific Gravity, Urine: 1.008 (ref 1.005–1.030)
pH: 7 (ref 5.0–8.0)

## 2021-12-30 LAB — BRAIN NATRIURETIC PEPTIDE: B Natriuretic Peptide: 414.6 pg/mL — ABNORMAL HIGH (ref 0.0–100.0)

## 2021-12-30 LAB — MAGNESIUM: Magnesium: 1.9 mg/dL (ref 1.7–2.4)

## 2021-12-30 MED ORDER — FUROSEMIDE 10 MG/ML IJ SOLN
40.0000 mg | Freq: Two times a day (BID) | INTRAMUSCULAR | Status: DC
  Administered 2021-12-31 – 2022-01-01 (×3): 40 mg via INTRAVENOUS
  Filled 2021-12-30 (×3): qty 4

## 2021-12-30 MED ORDER — PANTOPRAZOLE SODIUM 40 MG PO TBEC
40.0000 mg | DELAYED_RELEASE_TABLET | Freq: Every day | ORAL | Status: DC
  Administered 2021-12-31 – 2022-01-02 (×3): 40 mg via ORAL
  Filled 2021-12-30 (×3): qty 1

## 2021-12-30 MED ORDER — ENOXAPARIN SODIUM 40 MG/0.4ML IJ SOSY
40.0000 mg | PREFILLED_SYRINGE | INTRAMUSCULAR | Status: DC
  Administered 2021-12-31: 40 mg via SUBCUTANEOUS
  Filled 2021-12-30: qty 0.4

## 2021-12-30 MED ORDER — ONDANSETRON HCL 4 MG PO TABS: 4.0000 mg | ORAL_TABLET | Freq: Four times a day (QID) | ORAL | Status: DC | PRN

## 2021-12-30 MED ORDER — FERROUS SULFATE 325 (65 FE) MG PO TABS
325.0000 mg | ORAL_TABLET | Freq: Two times a day (BID) | ORAL | Status: DC
  Administered 2021-12-31 – 2022-01-02 (×5): 325 mg via ORAL
  Filled 2021-12-30 (×5): qty 1

## 2021-12-30 MED ORDER — POTASSIUM CHLORIDE 20 MEQ PO PACK
40.0000 meq | PACK | Freq: Once | ORAL | Status: AC
  Administered 2021-12-31: 40 meq via ORAL
  Filled 2021-12-30: qty 2

## 2021-12-30 MED ORDER — ENSURE ENLIVE PO LIQD
237.0000 mL | Freq: Two times a day (BID) | ORAL | Status: DC
  Administered 2021-12-31 – 2022-01-02 (×5): 237 mL via ORAL

## 2021-12-30 MED ORDER — MAGNESIUM HYDROXIDE 400 MG/5ML PO SUSP: 30.0000 mL | Freq: Every day | ORAL | Status: DC | PRN

## 2021-12-30 MED ORDER — VITAMIN D 25 MCG (1000 UNIT) PO TABS
1000.0000 [IU] | ORAL_TABLET | Freq: Every day | ORAL | Status: DC
Start: 2021-12-31 — End: 2022-01-02
  Administered 2021-12-31 – 2022-01-02 (×3): 1000 [IU] via ORAL
  Filled 2021-12-30 (×3): qty 1

## 2021-12-30 MED ORDER — AMLODIPINE BESYLATE 10 MG PO TABS
10.0000 mg | ORAL_TABLET | Freq: Every day | ORAL | Status: DC
  Administered 2021-12-31 – 2022-01-02 (×3): 10 mg via ORAL
  Filled 2021-12-30 (×2): qty 1
  Filled 2021-12-30: qty 2

## 2021-12-30 MED ORDER — ADULT MULTIVITAMIN W/MINERALS CH
ORAL_TABLET | Freq: Every day | ORAL | Status: DC
  Administered 2021-12-31 – 2022-01-02 (×3): 1 via ORAL
  Filled 2021-12-30 (×3): qty 1

## 2021-12-30 MED ORDER — ACETAMINOPHEN 325 MG PO TABS
650.0000 mg | ORAL_TABLET | Freq: Four times a day (QID) | ORAL | Status: DC | PRN
  Administered 2021-12-31 – 2022-01-01 (×3): 650 mg via ORAL
  Filled 2021-12-30 (×3): qty 2

## 2021-12-30 MED ORDER — TRAZODONE HCL 50 MG PO TABS
25.0000 mg | ORAL_TABLET | Freq: Every evening | ORAL | Status: DC | PRN
  Administered 2021-12-31: 25 mg via ORAL
  Filled 2021-12-30: qty 1

## 2021-12-30 MED ORDER — ASCORBIC ACID 500 MG PO TABS
500.0000 mg | ORAL_TABLET | Freq: Every day | ORAL | Status: DC
  Administered 2021-12-31 – 2022-01-02 (×3): 500 mg via ORAL
  Filled 2021-12-30 (×3): qty 1

## 2021-12-30 MED ORDER — ASPIRIN 325 MG PO TABS
325.0000 mg | ORAL_TABLET | Freq: Every day | ORAL | Status: DC
  Administered 2021-12-31: 325 mg via ORAL
  Filled 2021-12-30: qty 1

## 2021-12-30 MED ORDER — SERTRALINE HCL 50 MG PO TABS
50.0000 mg | ORAL_TABLET | Freq: Every day | ORAL | Status: DC
  Administered 2021-12-31 – 2022-01-02 (×3): 50 mg via ORAL
  Filled 2021-12-30 (×3): qty 1

## 2021-12-30 MED ORDER — LIDOCAINE 5 % EX PTCH
1.0000 | MEDICATED_PATCH | CUTANEOUS | Status: DC
  Administered 2021-12-30 – 2022-01-01 (×3): 1 via TRANSDERMAL
  Filled 2021-12-30 (×4): qty 1

## 2021-12-30 MED ORDER — IOHEXOL 350 MG/ML SOLN
75.0000 mL | Freq: Once | INTRAVENOUS | Status: AC | PRN
  Administered 2021-12-31: 75 mL via INTRAVENOUS

## 2021-12-30 MED ORDER — ATORVASTATIN CALCIUM 20 MG PO TABS
40.0000 mg | ORAL_TABLET | Freq: Every day | ORAL | Status: DC
  Administered 2021-12-31 – 2022-01-02 (×3): 40 mg via ORAL
  Filled 2021-12-30 (×3): qty 2

## 2021-12-30 MED ORDER — LISINOPRIL-HYDROCHLOROTHIAZIDE 20-12.5 MG PO TABS: 2.0000 | ORAL_TABLET | Freq: Every day | ORAL | Status: DC

## 2021-12-30 MED ORDER — FUROSEMIDE 10 MG/ML IJ SOLN
40.0000 mg | Freq: Once | INTRAMUSCULAR | Status: AC
  Administered 2021-12-30: 40 mg via INTRAVENOUS
  Filled 2021-12-30: qty 4

## 2021-12-30 MED ORDER — ACETAMINOPHEN 650 MG RE SUPP: 650.0000 mg | Freq: Four times a day (QID) | RECTAL | Status: DC | PRN

## 2021-12-30 MED ORDER — ONDANSETRON HCL 4 MG/2ML IJ SOLN: 4.0000 mg | Freq: Four times a day (QID) | INTRAMUSCULAR | Status: DC | PRN

## 2021-12-30 NOTE — ED Provider Notes (Signed)
[facility] Provider Note    Event Date/Time   First MD Initiated Contact with Patient 12/30/21 2055     (approximate)   History   Leg Swelling and Fall   HPI  Vanessa Farmer is a 86 y.o. female presents the ER for evaluation of severe bilateral leg swelling and edema to the point where she is now having trouble walking.  Had a fall secondary to severe swelling several days ago hit the right side of her chest on some furniture.  Did not hit her head no neck pain.  Denies any history of CHF.  Denies any back pain no abdominal pain no nausea or vomiting.     Physical Exam   Triage Vital Signs: ED Triage Vitals  Enc Vitals Group     BP 12/30/21 1813 (!) 141/58     Pulse Rate 12/30/21 1813 87     Resp 12/30/21 1813 20     Temp 12/30/21 1813 98 F (36.7 C)     Temp Source 12/30/21 1813 Oral     SpO2 12/30/21 1813 98 %     Weight 12/30/21 1813 110 lb (49.9 kg)     Height 12/30/21 1813 5' (1.524 m)     Head Circumference --      Peak Flow --      Pain Score 12/30/21 1812 4     Pain Loc --      Pain Edu? --      Excl. in [place]? --     Most recent vital signs: Vitals:   12/30/21 1813 12/30/21 2130  BP: (!) 141/58 136/68  Pulse: 87 77  Resp: 20 20  Temp: 98 F (36.7 C) 98.3 F (36.8 C)  SpO2: 98% 96%     Constitutional: Alert  Eyes: Conjunctivae are normal.  Head: Atraumatic. Nose: No congestion/rhinnorhea. Mouth/Throat: Mucous membranes are moist.   Neck: Painless ROM.  Cardiovascular:   Good peripheral circulation. No m/g/r Respiratory: Normal respiratory effort.  No retractions.  Gastrointestinal: Soft and nontender  Musculoskeletal:  no deformity, bilateral 3+ pitting edema Neurologic:  MAE spontaneously. No gross focal neurologic deficits are appreciated.  Skin:  Skin is warm, dry and intact. No rash noted. Psychiatric: Mood and affect are normal. Speech and behavior are normal.    ED Results / Procedures / Treatments    Labs (all labs ordered are listed, but only abnormal results are displayed) Labs Reviewed  CBC WITH DIFFERENTIAL/PLATELET - Abnormal; Notable for the following components:      Result Value   WBC 3.7 (*)    RBC 3.45 (*)    Hemoglobin 11.3 (*)    HCT 34.9 (*)    MCV 101.2 (*)    All other components within normal limits  BASIC METABOLIC PANEL - Abnormal; Notable for the following components:   Potassium 3.2 (*)    Glucose, Bld 101 (*)    Calcium 8.8 (*)    GFR, Estimated 57 (*)    All other components within normal limits  BRAIN NATRIURETIC PEPTIDE - Abnormal; Notable for the following components:   B Natriuretic Peptide 414.6 (*)    All other components within normal limits  HEPATIC FUNCTION PANEL - Abnormal; Notable for the following components:   Albumin 3.4 (*)    All other components within normal limits  URINALYSIS, ROUTINE W REFLEX MICROSCOPIC - Abnormal; Notable for the following components:   Color, Urine YELLOW (*)    APPearance CLEAR (*)  Leukocytes,Ua MODERATE (*)    All other components within normal limits  BASIC METABOLIC PANEL  CBC  MAGNESIUM  TROPONIN I (HIGH SENSITIVITY)  TROPONIN I (HIGH SENSITIVITY)     EKG  ED ECG REPORT I, Merlyn Lot, the attending physician, personally viewed and interpreted this ECG.   Date: 12/30/2021  EKG Time: 18:17  Rate: 90  Rhythm: sinus  Axis: normal  Intervals: normal intervals  ST&T Change: nonspecific st abn, no stemi    RADIOLOGY Please see ED Course for my review and interpretation.  I personally reviewed all radiographic images ordered to evaluate for the above acute complaints and reviewed radiology reports and findings.  These findings were personally discussed with the patient.  Please see medical record for radiology report.    PROCEDURES:  Critical Care performed: No  Procedures   MEDICATIONS ORDERED IN ED: Medications  lidocaine (LIDODERM) 5 % 1 patch (1 patch Transdermal Patch  Applied 12/30/21 2125)  aspirin tablet 325 mg (has no administration in time range)  amLODipine (NORVASC) tablet 10 mg (has no administration in time range)  atorvastatin (LIPITOR) tablet 40 mg (has no administration in time range)  lisinopril-hydrochlorothiazide (ZESTORETIC) 20-12.5 MG per tablet 2 tablet (has no administration in time range)  sertraline (ZOLOFT) tablet 50 mg (has no administration in time range)  pantoprazole (PROTONIX) EC tablet 40 mg (has no administration in time range)  ferrous sulfate tablet 325 mg (has no administration in time range)  cholecalciferol (VITAMIN D3) 25 MCG (1000 UNIT) tablet 1,000 Units (has no administration in time range)  Ensure liquid 237 mL (has no administration in time range)  One-A-Day Womens (has no administration in time range)  ascorbic acid (VITAMIN C) tablet 500 mg (has no administration in time range)  enoxaparin (LOVENOX) injection 40 mg (has no administration in time range)  acetaminophen (TYLENOL) tablet 650 mg (has no administration in time range)    Or  acetaminophen (TYLENOL) suppository 650 mg (has no administration in time range)  traZODone (DESYREL) tablet 25 mg (has no administration in time range)  magnesium hydroxide (MILK OF MAGNESIA) suspension 30 mL (has no administration in time range)  ondansetron (ZOFRAN) tablet 4 mg (has no administration in time range)    Or  ondansetron (ZOFRAN) injection 4 mg (has no administration in time range)  furosemide (LASIX) injection 40 mg (has no administration in time range)  potassium chloride (KLOR-CON) packet 40 mEq (has no administration in time range)  furosemide (LASIX) injection 40 mg (40 mg Intravenous Given 12/30/21 2341)     IMPRESSION / MDM / ASSESSMENT AND PLAN / ED COURSE  I reviewed the triage vital signs and the nursing notes.                              Differential diagnosis includes, but is not limited to, CHF, anemia, cellulitis, DVT, PE, electrolyte abnormality,  nephrotic syndrome  Patient presented to the ER for evaluation of symptoms as described above.  This presenting complaint could reflect a potentially life-threatening illness therefore the patient will be placed on continuous pulse oximetry and telemetry for monitoring.  Laboratory evaluation will be sent to evaluate for the above complaints.      Clinical Course as of 12/30/21 2344  Wed Dec 30, 2021  2107 Chest x-ray on my review and interpretation without cardiomegaly.  Per radiology report does have some the ninth rib fracture.  Her abdominal exam is soft and  benign.  She not complaining of any abdominal discomfort. [PR]    Clinical Course User Index [PR] Merlyn Lot, MD   Given her findings I am concerned for CHF.  Discussed case in consultation with hospitalist who agrees admit patient to his service.  Has requested CTA be performed which I will order.  FINAL CLINICAL IMPRESSION(S) / ED DIAGNOSES   Final diagnoses:  Edema, unspecified type  Closed fracture of multiple ribs of right side, initial encounter     Rx / DC Orders   ED Discharge Orders     None        Note:  This document was prepared using Dragon voice recognition software and may include unintentional dictation errors.    Merlyn Lot, MD 12/30/21 [phone]

## 2021-12-30 NOTE — H&P (Incomplete)
Vanessa Farmer   PATIENT NAME: Vanessa Farmer    MR#:  [id number]  DATE OF BIRTH:  05/19/1935  DATE OF ADMISSION:  12/30/2021  PRIMARY CARE PHYSICIAN: Venita Lick, NP   Patient is coming from: Home  REQUESTING/REFERRING PHYSICIAN: Merlyn Lot, MD  CHIEF COMPLAINT:   Chief Complaint  Patient presents with   Leg Swelling   Fall    HISTORY OF PRESENT ILLNESS:  ELAJAH Farmer is a 86 y.o. Caucasian female with medical history significant for GERD, hypertension, dyslipidemia, coronary artery disease, and depression, presented to the ER with acute onset worsening lower extremity edema with associated dyspnea, orthopnea paroxysmal nocturnal dyspnea as well as dyspnea on exertion lately.  She denies any cough or wheezing.  No fever or chills.  She stated that her lower extremity edema has been giving her difficulty with ambulation.  She fell last week when she lost balance with no presyncope or syncope, paresthesias or focal muscle weakness or head injuries.  No dysuria, oliguria or hematuria or flank pain.  ED Course: Upon presentation to the ER, BP was 141/58 with otherwise normal vital signs.  Labs revealed an albumin of 3.4 and BNP of 414.6.  CBC showed WBC of 3.7 with hemoglobin 11.3 and hematocrit 34.9 close to previous levels.  Urinalysis was positive for UTI EKG as reviewed by me : EKG showed normal sinus rhythm with a rate of 91 with premature supraventricular complexes and Q waves inferiorly Imaging: Two-view chest x-ray showed fracture through the right lateral seventh and eighth rib with no pneumothorax or effusion.  It showed COPD and chronic changes. Chest CTA revealed no evidence for PE.  Showed small bilateral pleural effusions, bibasilar atelectasis or infiltrate/pneumonia. Venous Doppler of both lower extremities showed nonocclusive thrombus within both peroneal veins in the calf and soft tissue edema.  The patient was given 40 mg of IV Lasix.  She will be  admitted to a cardiac telemetry bed for further evaluation and management. PAST MEDICAL HISTORY:   Past Medical History:  Diagnosis Date   Anemia    Cataract    Depression    Fall    GERD (gastroesophageal reflux disease)    Hyperlipidemia    Hypertension    Myocardial infarction ([place])    "mild" many yrs ago    PAST SURGICAL HISTORY:   Past Surgical History:  Procedure Laterality Date   ABDOMINAL HYSTERECTOMY     CATARACT EXTRACTION, BILATERAL  2021   COLONOSCOPY WITH PROPOFOL N/A 12/05/2017   Procedure: COLONOSCOPY WITH PROPOFOL;  Surgeon: Lucilla Lame, MD;  Location: [facility];  Service: Endoscopy;  Laterality: N/A;   ESOPHAGOGASTRODUODENOSCOPY (EGD) WITH PROPOFOL N/A 12/05/2017   Procedure: ESOPHAGOGASTRODUODENOSCOPY (EGD) WITH PROPOFOL;  Surgeon: Lucilla Lame, MD;  Location: [facility];  Service: Endoscopy;  Laterality: N/A;   POLYPECTOMY  12/05/2017   Procedure: POLYPECTOMY;  Surgeon: Lucilla Lame, MD;  Location: [facility];  Service: Endoscopy;;   TONSILLECTOMY      SOCIAL HISTORY:   Social History   Tobacco Use   Smoking status: Never   Smokeless tobacco: Never   Tobacco comments:    smoking cessation materials not required  Substance Use Topics   Alcohol use: No    FAMILY HISTORY:   Family History  Problem Relation Age of Onset   Heart disease Mother    Brain cancer Mother    Heart attack Father    Liver disease Daughter    Throat cancer  Brother    Cancer Maternal Grandfather        Unsure type    DRUG ALLERGIES:  No Known Allergies  REVIEW OF SYSTEMS:   ROS As per history of present illness. All pertinent systems were reviewed above. Constitutional, HEENT, cardiovascular, respiratory, GI, GU, musculoskeletal, neuro, psychiatric, endocrine, integumentary and hematologic systems were reviewed and are otherwise negative/unremarkable except for positive findings mentioned above in the HPI.   MEDICATIONS AT HOME:   Prior  to Admission medications   Medication Sig Start Date End Date Taking? Authorizing Provider  amLODipine (NORVASC) 10 MG tablet TAKE 1 TABLET(10 MG) BY MOUTH DAILY 09/23/21   Glean Hess, MD  aspirin 325 MG tablet Take 325 mg by mouth daily.    [provider]  atorvastatin (LIPITOR) 40 MG tablet TAKE 1 TABLET(40 MG) BY MOUTH DAILY 09/25/21   Glean Hess, MD  cholecalciferol (VITAMIN D3) 25 MCG (1000 UT) tablet Take 1,000 Units by mouth daily.    [provider]  ENSURE (ENSURE) Take 237 mLs by mouth 2 (two) times daily between meals. 10/20/17 10/17/20  Glean Hess, MD  ferrous sulfate (FEROSUL) 325 (65 FE) MG tablet TAKE 1 TABLET BY MOUTH TWICE DAILY WITH A MEAL 08/04/18   Earlie Server, MD  furosemide (LASIX) 20 MG tablet Take 1 tablet (20 mg total) by mouth 2 (two) times a week. 10/20/20   Glean Hess, MD  lisinopril-hydrochlorothiazide (ZESTORETIC) 20-12.5 MG tablet TAKE 2 TABLETS BY MOUTH DAILY 09/23/21   Glean Hess, MD  Multiple Vitamins-Calcium (ONE-A-DAY WOMENS PO) Take 1 tablet by mouth daily.    [provider]  pantoprazole (PROTONIX) 40 MG tablet TAKE 1 TABLET(40 MG) BY MOUTH DAILY 09/08/21   Glean Hess, MD  sertraline (ZOLOFT) 50 MG tablet TAKE 1 TABLET(50 MG) BY MOUTH DAILY 09/23/21   Glean Hess, MD  vitamin C (ASCORBIC ACID) 500 MG tablet Take 1 tablet (500 mg total) by mouth daily. 01/19/18   Earlie Server, MD      VITAL SIGNS:  Blood pressure (!) 151/87, pulse 78, temperature 98.4 F (36.9 C), temperature source Oral, resp. rate 16, height 5' (1.524 m), weight 49.9 kg, SpO2 96 %.  PHYSICAL EXAMINATION:  Physical Exam  GENERAL:  86 y.o.-year-old Caucasian female patient lying in the bed with no acute distress.  EYES: Pupils equal, round, reactive to light and accommodation. No scleral icterus. Extraocular muscles intact.  HEENT: Head atraumatic, normocephalic. Oropharynx and nasopharynx clear.  NECK:  Supple, no jugular  venous distention. No thyroid enlargement, no tenderness.  LUNGS: Diminished bibasilar breath sounds with mild bibasilar rales.  No use of accessory muscles of respiration.  CARDIOVASCULAR: Regular rate and rhythm, S1, S2 normal. No murmurs, rubs, or gallops.  ABDOMEN: Soft, nondistended, nontender. Bowel sounds present. No organomegaly or mass.  EXTREMITIES: 3+ bilateral lower extremity soft pitting edema with no cyanosis, or clubbing.  NEUROLOGIC: Cranial nerves II through XII are intact. Muscle strength 5/5 in all extremities. Sensation intact. Gait not checked.  PSYCHIATRIC: The patient is alert and oriented x 3.  Normal affect and good eye contact. SKIN: No obvious rash, lesion, or ulcer.   LABORATORY PANEL:   CBC Recent Labs  Lab 12/30/21 1818  WBC 3.7*  HGB 11.3*  HCT 34.9*  PLT 159   ------------------------------------------------------------------------------------------------------------------  Chemistries  Recent Labs  Lab 12/30/21 1818 12/30/21 1825  NA 136  --   K 3.2*  --   CL 100  --  CO2 29  --   GLUCOSE 101*  --   BUN 14  --   CREATININE 0.98  --   CALCIUM 8.8*  --   MG  --  1.9  AST  --  24  ALT  --  21  ALKPHOS  --  69  BILITOT  --  0.8   ------------------------------------------------------------------------------------------------------------------  Cardiac Enzymes No results for input(s): "TROPONINI" in the last 168 hours. ------------------------------------------------------------------------------------------------------------------  RADIOLOGY:  CT Angio Chest PE W and/or Wo Contrast  Result Date: 12/31/2021 CLINICAL DATA:  Pulmonary embolism (PE) suspected, high prob EXAM: CT ANGIOGRAPHY CHEST WITH CONTRAST TECHNIQUE: Multidetector CT imaging of the chest was performed using the standard protocol during bolus administration of intravenous contrast. Multiplanar CT image reconstructions and MIPs were obtained to evaluate the vascular  anatomy. RADIATION DOSE REDUCTION: This exam was performed according to the departmental dose-optimization program which includes automated exposure control, adjustment of the mA and/or kV according to patient size and/or use of iterative reconstruction technique. CONTRAST:  17m OMNIPAQUE IOHEXOL 350 MG/ML SOLN COMPARISON:  None Available. FINDINGS: Cardiovascular: No filling defects in the pulmonary arteries to suggest pulmonary emboli. Cardiomegaly. Coronary artery and aortic atherosclerosis. No aneurysm. Mediastinum/Nodes: No mediastinal, hilar, or axillary adenopathy. Trachea and esophagus are unremarkable. Thyroid unremarkable. Lungs/Pleura: Biapical scarring. Small bilateral pleural effusions. Bibasilar atelectasis or infiltrates. Upper Abdomen: No acute findings Musculoskeletal: Chest wall soft tissues are unremarkable. Right lateral rib fractures involving the 9th through 11th ribs. Review of the MIP images confirms the above findings. IMPRESSION: No evidence of pulmonary embolus. Small bilateral pleural effusions. Bibasilar atelectasis or infiltrates/pneumonia. Cardiomegaly, coronary artery disease. Aortic Atherosclerosis (ICD10-I70.0). Electronically Signed   By: KRolm BaptiseM.D.   On: 12/31/2021 00:14   UKoreaVenous Img Lower Bilateral  Result Date: 12/30/2021 CLINICAL DATA:  Swelling. EXAM: BILATERAL LOWER EXTREMITY VENOUS DOPPLER ULTRASOUND TECHNIQUE: Gray-scale sonography with compression, as well as color and duplex ultrasound, were performed to evaluate the deep venous system(s) from the level of the common femoral vein through the popliteal and proximal calf veins. COMPARISON:  None Available. FINDINGS: VENOUS There is nonocclusive thrombus within bilateral peroneal veins in the calf. No proximal DVT. Normal compressibility of the common femoral, superficial femoral, and popliteal veins. Visualized portions of profunda femoral vein and great saphenous vein unremarkable. Normal appearance of the  posterior tibial vein. No filling defects to suggest DVT on grayscale or color Doppler imaging. Doppler waveforms show normal direction of venous flow, normal respiratory plasticity and response to augmentation. OTHER Right popliteal fluid collection measuring 4.1 x 1.4 x 3.1 cm typical of Baker cyst. There is generalized edema. Limitations: none IMPRESSION: 1. Nonocclusive thrombus within the bilateral peroneal veins in the calf. 2. No DVT proximal to the calf. 3. Soft tissue edema. Electronically Signed   By: MKeith RakeM.D.   On: 12/30/2021 22:49   DG Chest 2 View  Result Date: 12/30/2021 CLINICAL DATA:  Shortness of breath, rib pain after fall EXAM: CHEST - 2 VIEW COMPARISON:  10/17/2020 FINDINGS: Large hiatal hernia. Heart is normal size. Aortic atherosclerosis. There is hyperinflation of the lungs compatible with COPD. Biapical and bibasilar scarring. No effusions or pneumothorax. Fracture through the right lateral 7th or 8th rib. IMPRESSION: Fracture through the right lateral 7th or 8th rib. No associated effusion or pneumothorax. COPD/chronic changes. Electronically Signed   By: KRolm BaptiseM.D.   On: 12/30/2021 18:45      IMPRESSION AND PLAN:  Assessment and Plan: * Acute CHF (  congestive heart failure) ([place]) - She will be admitted to a cardiac telemetry bed. - This likely acute diastolic CHF. - We will continue diuresis with IV Lasix. - We will follow I's and O's and daily weights. - We will check serial troponins. - We will obtain a 2D echo and cardiology consult. - I notified Dr. Clayborn Bigness about the patient.  Hypokalemia - We will replace her potassium and check magnesium level.  Acute lower UTI - We will place the patient on IV Rocephin and follow urine culture.  Fracture of rib of right side - This is secondary to her recent fall with subsequent right lateral seventh and eighth rib fractures. - Pain management will be provided.  DVT, lower extremity, distal, acute,  bilateral ([place]) - This is a nonocclusive bilateral peroneal DVT. - Patient be placed on therapeutic Lovenox be discharged on oral anticoagulant therapy.  Hyperlipidemia - We will continue statin therapy.  CAD (coronary artery disease) - We will continue aspirin, statin therapy in a.m. and ACE inhibitor therapy.  Essential hypertension - We will continue her antihypertensives.    DVT prophylaxis: Lovenox.  Advanced Care Planning:  Code Status: DNR/DNI.  This was discussed with her. Family Communication:  The plan of care was discussed in details with the patient (and family). I answered all questions. The patient agreed to proceed with the above mentioned plan. Further management will depend upon hospital course. Disposition Plan: Back to previous home environment Consults called: Cardiology.. All the records are reviewed and case discussed with ED provider.  Status is: Inpatient  At the time of the admission, it appears that the appropriate admission status for this patient is inpatient.  This is judged to be reasonable and necessary in order to provide the required intensity of service to ensure the patient's safety given the presenting symptoms, physical exam findings and initial radiographic and laboratory data in the context of comorbid conditions.  The patient requires inpatient status due to high intensity of service, high risk of further deterioration and high frequency of surveillance required.  I certify that at the time of admission, it is my clinical judgment that the patient will require inpatient hospital care extending more than 2 midnights.                            Dispo: The patient is from: Home              Anticipated d/c is to: Home              Patient currently is not medically stable to d/c.              Difficult to place patient: No  Christel Mormon M.D on 12/31/2021 at 2:53 AM  [facility]   From 7 PM-7 AM, contact  night-coverage [email or web address]  CC: Primary care physician; Venita Lick, NP

## 2021-12-30 NOTE — ED Triage Notes (Signed)
Patient arrives in wheelchair with daughter c/o bilateral leg edema onset of last week. Went to [facility] and concerned of new CHF. Patient fell last week after losing her balance. Redness and swelling noted to bilateral lower legs. Also having right sided rib cage pain since fall.

## 2021-12-31 ENCOUNTER — Other Ambulatory Visit (HOSPITAL_COMMUNITY): Payer: Self-pay

## 2021-12-31 ENCOUNTER — Inpatient Hospital Stay (HOSPITAL_COMMUNITY)
Admit: 2021-12-31 | Discharge: 2021-12-31 | Disposition: A | Discharge status: Home / Self Care | Payer: Medicare Other | Attending: Family Medicine | Admitting: Family Medicine

## 2021-12-31 ENCOUNTER — Telehealth (HOSPITAL_COMMUNITY): Payer: Self-pay

## 2021-12-31 DIAGNOSIS — S2241XA Multiple fractures of ribs, right side, initial encounter for closed fracture: Secondary | ICD-10-CM | POA: Diagnosis not present

## 2021-12-31 DIAGNOSIS — S2231XA Fracture of one rib, right side, initial encounter for closed fracture: Secondary | ICD-10-CM

## 2021-12-31 DIAGNOSIS — I2510 Atherosclerotic heart disease of native coronary artery without angina pectoris: Secondary | ICD-10-CM | POA: Diagnosis not present

## 2021-12-31 DIAGNOSIS — I5031 Acute diastolic (congestive) heart failure: Secondary | ICD-10-CM

## 2021-12-31 DIAGNOSIS — I824Z3 Acute embolism and thrombosis of unspecified deep veins of distal lower extremity, bilateral: Secondary | ICD-10-CM | POA: Diagnosis not present

## 2021-12-31 DIAGNOSIS — N390 Urinary tract infection, site not specified: Secondary | ICD-10-CM

## 2021-12-31 DIAGNOSIS — E876 Hypokalemia: Secondary | ICD-10-CM

## 2021-12-31 LAB — BASIC METABOLIC PANEL
Anion gap: 8 (ref 5–15)
BUN: 12 mg/dL (ref 8–23)
CO2: 31 mmol/L (ref 22–32)
Calcium: 8.9 mg/dL (ref 8.9–10.3)
Chloride: 98 mmol/L (ref 98–111)
Creatinine, Ser: 0.9 mg/dL (ref 0.44–1.00)
GFR, Estimated: >60 mL/min (ref >60)
Glucose, Bld: 105 mg/dL — ABNORMAL HIGH (ref 70–99)
Potassium: 3.7 mmol/L (ref 3.5–5.1)
Sodium: 137 mmol/L (ref 135–145)

## 2021-12-31 LAB — CBC
HCT: 34.9 % — ABNORMAL LOW (ref 36.0–46.0)
Hemoglobin: 11.5 g/dL — ABNORMAL LOW (ref 12.0–15.0)
MCH: 33 pg (ref 26.0–34.0)
MCHC: 33 g/dL (ref 30.0–36.0)
MCV: 100 fL (ref 80.0–100.0)
Platelets: 165 10*3/uL (ref 150–400)
RBC: 3.49 MIL/uL — ABNORMAL LOW (ref 3.87–5.11)
RDW: 12.8 % (ref 11.5–15.5)
WBC: 3.9 10*3/uL — ABNORMAL LOW (ref 4.0–10.5)
nRBC: 0 % (ref 0.0–0.2)

## 2021-12-31 LAB — ECHOCARDIOGRAM COMPLETE
AR max vel: 2.39 cm2
AV Area VTI: 2.72 cm2
AV Area mean vel: 2.29 cm2
AV Mean grad: 4 mmHg
AV Peak grad: 6 mmHg
Ao pk vel: 1.22 m/s
Area-P 1/2: 3.54 cm2
Height: 60 in
S' Lateral: 2.6 cm
Weight: 1760 oz

## 2021-12-31 LAB — CBG MONITORING, ED: Glucose-Capillary: 100 mg/dL — ABNORMAL HIGH (ref 70–99)

## 2021-12-31 LAB — TROPONIN I (HIGH SENSITIVITY): Troponin I (High Sensitivity): 11 ng/L (ref <18)

## 2021-12-31 MED ORDER — SODIUM CHLORIDE 0.9 % IV SOLN
1.0000 g | INTRAVENOUS | Status: DC
  Administered 2021-12-31 – 2022-01-01 (×2): 1 g via INTRAVENOUS
  Filled 2021-12-31: qty 1

## 2021-12-31 MED ORDER — APIXABAN 5 MG PO TABS: 5.0000 mg | ORAL_TABLET | Freq: Two times a day (BID) | ORAL | Status: DC

## 2021-12-31 MED ORDER — LISINOPRIL 20 MG PO TABS
40.0000 mg | ORAL_TABLET | Freq: Every day | ORAL | Status: DC
  Administered 2021-12-31 – 2022-01-02 (×3): 40 mg via ORAL
  Filled 2021-12-31: qty 4
  Filled 2021-12-31 (×2): qty 2

## 2021-12-31 MED ORDER — HYDROCHLOROTHIAZIDE 25 MG PO TABS
25.0000 mg | ORAL_TABLET | Freq: Every day | ORAL | Status: DC
  Administered 2021-12-31 – 2022-01-01 (×2): 25 mg via ORAL
  Filled 2021-12-31 (×2): qty 1

## 2021-12-31 MED ORDER — APIXABAN 5 MG PO TABS
10.0000 mg | ORAL_TABLET | Freq: Two times a day (BID) | ORAL | Status: DC
  Administered 2021-12-31 – 2022-01-02 (×4): 10 mg via ORAL
  Filled 2021-12-31 (×4): qty 2

## 2021-12-31 NOTE — Assessment & Plan Note (Addendum)
-   This is secondary to her recent fall with subsequent right lateral seventh and eighth rib fractures. - Pain management will be provided.

## 2021-12-31 NOTE — Assessment & Plan Note (Signed)
-   We will place the patient on IV Rocephin and follow urine culture.

## 2021-12-31 NOTE — Progress Notes (Signed)
[place] [facility] [place] NAME: Vanessa Farmer    MR#:  [id number]  DATE OF BIRTH:  [birth year]  SUBJECTIVE:      VITALS:  Blood pressure (!) 114/50, pulse 77, temperature 98.2 F (36.8 C), temperature source Oral, resp. rate 18, height 5' (1.524 m), weight 49.9 kg, SpO2 94 %.  PHYSICAL EXAMINATION:   GENERAL:  86 y.o.-year-old patient lying in the bed with no acute distress.  LUNGS: Normal breath sounds bilaterally, no wheezing, rales, rhonchi.  CARDIOVASCULAR: S1, S2 normal. No murmurs, rubs, or gallops.  ABDOMEN: Soft, nontender, nondistended. Bowel sounds present.  EXTREMITIES: No  edema b/l.    NEUROLOGIC: nonfocal  patient is alert and awake SKIN: No obvious rash, lesion, or ulcer.   LABORATORY PANEL:  CBC Recent Labs  Lab 12/31/21 0509  WBC 3.9*  HGB 11.5*  HCT 34.9*  PLT 165    Chemistries  Recent Labs  Lab 12/30/21 1825 12/31/21 0509  NA  --  137  K  --  3.7  CL  --  98  CO2  --  31  GLUCOSE  --  105*  BUN  --  12  CREATININE  --  0.90  CALCIUM  --  8.9  MG 1.9  --   AST 24  --   ALT 21  --   ALKPHOS 69  --   BILITOT 0.8  --    Cardiac Enzymes No results for input(s): "TROPONINI" in the last 168 hours. RADIOLOGY:  ECHOCARDIOGRAM COMPLETE  Result Date: 12/31/2021    ECHOCARDIOGRAM REPORT   Patient Name:   Vanessa Farmer Date of Exam: 12/31/2021 Medical Rec #:  [id number]      Height:       60.0 in Accession #:    [id number]     Weight:       110.0 lb Date of Birth:  03-28-1936       BSA:          1.448 m Patient Age:    88 years       BP:           146/57 mmHg Patient Gender: F              HR:           70 bpm. Exam Location:  [facility] Procedure: 2D Echo, Cardiac Doppler and Color Doppler Indications:     CHF- acute diastolic Z99.35  History:         Patient has no prior history of Echocardiogram examinations.                  Previous Myocardial Infarction; Risk Factors:Hypertension and                  Dyslipidemia.   Sonographer:     Sherrie Sport Referring Phys:  [id number] Yell Diagnosing Phys: Ida Rogue MD IMPRESSIONS  1. Left ventricular ejection fraction, by estimation, is 55 to 60%. The left ventricle has normal function. The left ventricle has no regional wall motion abnormalities. There is moderate left ventricular hypertrophy. Left ventricular diastolic parameters are consistent with Grade I diastolic dysfunction (impaired relaxation).  2. Right ventricular systolic function is normal. The right ventricular size is normal. There is normal pulmonary artery systolic pressure. The estimated right ventricular systolic pressure is 90.3 mmHg.  3. Left atrial size was moderately dilated.  4. The mitral valve is normal in structure. Mild  mitral valve regurgitation. No evidence of mitral stenosis. Moderate mitral annular calcification.  5. The aortic valve is normal in structure. Aortic valve regurgitation is not visualized. Aortic valve sclerosis is present, with no evidence of aortic valve stenosis.  6. The inferior vena cava is normal in size with greater than 50% respiratory variability, suggesting right atrial pressure of 3 mmHg. FINDINGS  Left Ventricle: Left ventricular ejection fraction, by estimation, is 55 to 60%. The left ventricle has normal function. The left ventricle has no regional wall motion abnormalities. The left ventricular internal cavity size was normal in size. There is  moderate left ventricular hypertrophy. Left ventricular diastolic parameters are consistent with Grade I diastolic dysfunction (impaired relaxation). Right Ventricle: The right ventricular size is normal. No increase in right ventricular wall thickness. Right ventricular systolic function is normal. There is normal pulmonary artery systolic pressure. The tricuspid regurgitant velocity is 1.93 m/s, and  with an assumed right atrial pressure of 5 mmHg, the estimated right ventricular systolic pressure is 16.9 mmHg. Left Atrium: Left  atrial size was moderately dilated. Right Atrium: Right atrial size was normal in size. Pericardium: There is no evidence of pericardial effusion. Mitral Valve: The mitral valve is normal in structure. There is mild calcification of the mitral valve leaflet(s). Moderate mitral annular calcification. Mild mitral valve regurgitation. No evidence of mitral valve stenosis. Tricuspid Valve: The tricuspid valve is normal in structure. Tricuspid valve regurgitation is mild . No evidence of tricuspid stenosis. Aortic Valve: The aortic valve is normal in structure. Aortic valve regurgitation is not visualized. Aortic valve sclerosis is present, with no evidence of aortic valve stenosis. Aortic valve mean gradient measures 4.0 mmHg. Aortic valve peak gradient measures 6.0 mmHg. Aortic valve area, by VTI measures 2.72 cm. Pulmonic Valve: The pulmonic valve was normal in structure. Pulmonic valve regurgitation is not visualized. No evidence of pulmonic stenosis. Aorta: The aortic root is normal in size and structure. Venous: The inferior vena cava is normal in size with greater than 50% respiratory variability, suggesting right atrial pressure of 3 mmHg. IAS/Shunts: No atrial level shunt detected by color flow Doppler.  LEFT VENTRICLE PLAX 2D LVIDd:         3.70 cm   Diastology LVIDs:         2.60 cm   LV e' medial:    4.90 cm/s LV PW:         1.00 cm   LV E/e' medial:  17.3 LV IVS:        1.60 cm   LV e' lateral:   4.68 cm/s LVOT diam:     2.00 cm   LV E/e' lateral: 18.1 LV SV:         66 LV SV Index:   46 LVOT Area:     3.14 cm  RIGHT VENTRICLE RV Basal diam:  3.40 cm RV S prime:     13.10 cm/s TAPSE (M-mode): 1.9 cm LEFT ATRIUM           Index        RIGHT ATRIUM           Index LA diam:      4.30 cm 2.97 cm/m   RA Area:     10.60 cm LA Vol (A2C): 91.1 ml 62.92 ml/m  RA Volume:   23.40 ml  16.16 ml/m LA Vol (A4C): 33.2 ml 22.93 ml/m  AORTIC VALVE AV Area (Vmax):    2.39 cm AV Area (Vmean):   2.29  cm AV Area (VTI):      2.72 cm AV Vmax:           122.00 cm/s AV Vmean:          86.600 cm/s AV VTI:            0.244 m AV Peak Grad:      6.0 mmHg AV Mean Grad:      4.0 mmHg LVOT Vmax:         92.70 cm/s LVOT Vmean:        63.100 cm/s LVOT VTI:          0.211 m LVOT/AV VTI ratio: 0.86  AORTA Ao Root diam: 2.62 cm MITRAL VALVE               TRICUSPID VALVE MV Area (PHT): 3.54 cm    TR Peak grad:   14.9 mmHg MV Decel Time: 214 msec    TR Vmax:        193.00 cm/s MV E velocity: 84.80 cm/s MV A velocity: 98.50 cm/s  SHUNTS MV E/A ratio:  0.86        Systemic VTI:  0.21 m                            Systemic Diam: 2.00 cm Ida Rogue MD Electronically signed by Ida Rogue MD Signature Date/Time: 12/31/2021/2:14:45 PM    Final    CT Angio Chest PE W and/or Wo Contrast  Result Date: 12/31/2021 CLINICAL DATA:  Pulmonary embolism (PE) suspected, high prob EXAM: CT ANGIOGRAPHY CHEST WITH CONTRAST TECHNIQUE: Multidetector CT imaging of the chest was performed using the standard protocol during bolus administration of intravenous contrast. Multiplanar CT image reconstructions and MIPs were obtained to evaluate the vascular anatomy. RADIATION DOSE REDUCTION: This exam was performed according to the departmental dose-optimization program which includes automated exposure control, adjustment of the mA and/or kV according to patient size and/or use of iterative reconstruction technique. CONTRAST:  51m OMNIPAQUE IOHEXOL 350 MG/ML SOLN COMPARISON:  None Available. FINDINGS: Cardiovascular: No filling defects in the pulmonary arteries to suggest pulmonary emboli. Cardiomegaly. Coronary artery and aortic atherosclerosis. No aneurysm. Mediastinum/Nodes: No mediastinal, hilar, or axillary adenopathy. Trachea and esophagus are unremarkable. Thyroid unremarkable. Lungs/Pleura: Biapical scarring. Small bilateral pleural effusions. Bibasilar atelectasis or infiltrates. Upper Abdomen: No acute findings Musculoskeletal: Chest wall soft tissues are  unremarkable. Right lateral rib fractures involving the 9th through 11th ribs. Review of the MIP images confirms the above findings. IMPRESSION: No evidence of pulmonary embolus. Small bilateral pleural effusions. Bibasilar atelectasis or infiltrates/pneumonia. Cardiomegaly, coronary artery disease. Aortic Atherosclerosis (ICD10-I70.0). Electronically Signed   By: KRolm BaptiseM.D.   On: 12/31/2021 00:14   UKoreaVenous Img Lower Bilateral  Result Date: 12/30/2021 CLINICAL DATA:  Swelling. EXAM: BILATERAL LOWER EXTREMITY VENOUS DOPPLER ULTRASOUND TECHNIQUE: Gray-scale sonography with compression, as well as color and duplex ultrasound, were performed to evaluate the deep venous system(s) from the level of the common femoral vein through the popliteal and proximal calf veins. COMPARISON:  None Available. FINDINGS: VENOUS There is nonocclusive thrombus within bilateral peroneal veins in the calf. No proximal DVT. Normal compressibility of the common femoral, superficial femoral, and popliteal veins. Visualized portions of profunda femoral vein and great saphenous vein unremarkable. Normal appearance of the posterior tibial vein. No filling defects to suggest DVT on grayscale or color Doppler imaging. Doppler waveforms show normal direction of venous flow, normal respiratory plasticity and  response to augmentation. OTHER Right popliteal fluid collection measuring 4.1 x 1.4 x 3.1 cm typical of Baker cyst. There is generalized edema. Limitations: none IMPRESSION: 1. Nonocclusive thrombus within the bilateral peroneal veins in the calf. 2. No DVT proximal to the calf. 3. Soft tissue edema. Electronically Signed   By: Keith Rake M.D.   On: 12/30/2021 22:49   DG Chest 2 View  Result Date: 12/30/2021 CLINICAL DATA:  Shortness of breath, rib pain after fall EXAM: CHEST - 2 VIEW COMPARISON:  10/17/2020 FINDINGS: Large hiatal hernia. Heart is normal size. Aortic atherosclerosis. There is hyperinflation of the lungs  compatible with COPD. Biapical and bibasilar scarring. No effusions or pneumothorax. Fracture through the right lateral 7th or 8th rib. IMPRESSION: Fracture through the right lateral 7th or 8th rib. No associated effusion or pneumothorax. COPD/chronic changes. Electronically Signed   By: Rolm Baptise M.D.   On: 12/30/2021 18:45    Assessment and Plan  Vanessa Farmer is a 86 y.o. Caucasian female with medical history significant for GERD, hypertension, dyslipidemia, coronary artery disease, and depression, presented to the ER with acute onset worsening lower extremity edema with associated dyspnea, orthopnea paroxysmal nocturnal dyspnea as well as dyspnea on exertion lately.  She denies any cough or wheezing.  chest x-ray showed fracture through the right lateral seventh and eighth rib with no pneumothorax or effusion.  It showed COPD and chronic changes.  Chest CTA revealed no evidence for PE.  Showed small bilateral pleural effusions, bibasilar atelectasis or infiltrate/pneumonia.  Venous Doppler of both lower extremities showed nonocclusive thrombus within both peroneal veins in the calf and soft tissue edema.  Acute congestive heart failure diastolic -- patient came in with increasing shortness of breath, elevated BNP of 400 with chest x-ray showing interstitial edema -- responding to IV Lasix -- breathing improved -- PRN nebs -- assess for home oxygen  Fall and rib pain with rib fracture right lateral 9-11 -- PRN pain meds -- physical therapy to see patient  Bilateral peroneal vein DVT -- patient recently had followed sustained a fracture and was not able to get around the house -- will treat with PO anticoagulation at least for three months. Defer to PCP for follow-up -- discussed with pharmacist start PO eliquis -- hemoglobin stable  Hypertension -- continue lisinopril, hydrochlorothiazide, amlodipine  Hyperlipidemia -- continue statins      Procedures: Family  communication : none Consults : none CODE STATUS: DNR DVT Prophylaxis : eliquis Level of care: Telemetry Cardiac Status is: Inpatient Remains inpatient appropriate because: CHF and ongoing IV diuresis    TOTAL TIME TAKING CARE OF THIS PATIENT: 35 minutes.  >50% time spent on counselling and coordination of care  Note: This dictation was prepared with Dragon dictation along with smaller phrase technology. Any transcriptional errors that result from this process are unintentional.  Fritzi Mandes M.D    [facility]   CC: Primary care physician; Venita Lick, NP

## 2021-12-31 NOTE — Assessment & Plan Note (Signed)
-   We will continue her antihypertensives. 

## 2021-12-31 NOTE — Assessment & Plan Note (Signed)
-   We will continue statin therapy. 

## 2021-12-31 NOTE — Progress Notes (Signed)
Anticoagulant Counseling and Medication Reconciliation  Discussed new blood thinner Eliquis with patient. Daughter present. Patient states that she has had multiple falls within the past few months and most recent one caused her to fracture two ribs. Will notify MD and cardiovascular team on having benefit vs risk discussion with patient.

## 2021-12-31 NOTE — Progress Notes (Signed)
*  PRELIMINARY RESULTS* Echocardiogram 2D Echocardiogram has been performed.  Sherrie Sport 12/31/2021, 9:40 AM

## 2021-12-31 NOTE — TOC Benefit Eligibility Note (Signed)
Patient Teacher, English as a foreign language completed.    The patient is currently admitted and upon discharge could be taking Eliquis.  The current 30 day co-pay is $0.00.   The patient is insured through [place] Patient Advocate Specialist [place] Patient Advocate Team Direct Number: [phone]  Fax: [phone]

## 2021-12-31 NOTE — Assessment & Plan Note (Signed)
-   This is a nonocclusive bilateral peroneal DVT. - Patient be placed on therapeutic Lovenox be discharged on oral anticoagulant therapy.

## 2021-12-31 NOTE — Assessment & Plan Note (Signed)
-   We will continue aspirin, statin therapy in a.m. and ACE inhibitor therapy.

## 2021-12-31 NOTE — Assessment & Plan Note (Signed)
-   We will replace her potassium and check magnesium level.

## 2021-12-31 NOTE — Assessment & Plan Note (Addendum)
-   She will be admitted to a cardiac telemetry bed. - This likely acute diastolic CHF. - We will continue diuresis with IV Lasix. - We will follow I's and O's and daily weights. - We will check serial troponins. - We will obtain a 2D echo and cardiology consult. - I notified Dr. Clayborn Bigness about the patient.

## 2021-12-31 NOTE — Telephone Encounter (Signed)
Pharmacy Patient Advocate Encounter  Insurance verification completed.    The patient is insured through [place] Medicare Part D   The patient is currently admitted and ran test claims for the following: Eliquis.  Copays and coinsurance results were relayed to Inpatient clinical team.

## 2022-01-01 DIAGNOSIS — I2510 Atherosclerotic heart disease of native coronary artery without angina pectoris: Secondary | ICD-10-CM | POA: Diagnosis not present

## 2022-01-01 DIAGNOSIS — S2241XA Multiple fractures of ribs, right side, initial encounter for closed fracture: Secondary | ICD-10-CM | POA: Diagnosis not present

## 2022-01-01 DIAGNOSIS — I824Z3 Acute embolism and thrombosis of unspecified deep veins of distal lower extremity, bilateral: Secondary | ICD-10-CM | POA: Diagnosis not present

## 2022-01-01 DIAGNOSIS — I5031 Acute diastolic (congestive) heart failure: Secondary | ICD-10-CM | POA: Diagnosis not present

## 2022-01-01 LAB — URINE CULTURE

## 2022-01-01 MED ORDER — TRAMADOL HCL 50 MG PO TABS
50.0000 mg | ORAL_TABLET | Freq: Three times a day (TID) | ORAL | Status: DC | PRN
  Administered 2022-01-01 – 2022-01-02 (×2): 50 mg via ORAL
  Filled 2022-01-01 (×2): qty 1

## 2022-01-01 MED ORDER — SODIUM CHLORIDE 0.9 % IV BOLUS
250.0000 mL | Freq: Once | INTRAVENOUS | Status: AC
  Administered 2022-01-01: 250 mL via INTRAVENOUS

## 2022-01-01 MED ORDER — FUROSEMIDE 10 MG/ML IJ SOLN
40.0000 mg | Freq: Every day | INTRAMUSCULAR | Status: DC
  Administered 2022-01-02: 40 mg via INTRAVENOUS
  Filled 2022-01-01: qty 4

## 2022-01-01 NOTE — Evaluation (Signed)
Occupational Therapy Evaluation Patient Details Name: Vanessa Farmer MRN: [id number] DOB: 1935/09/23 Today's Date: 01/01/2022   History of Present Illness 86 y.o. Caucasian female with medical history significant for GERD, hypertension, dyslipidemia, coronary artery disease, and depression, presented to the ER with acute onset worsening lower extremity edema with associated dyspnea, orthopnea paroxysmal nocturnal dyspnea as well as dyspnea on exertion lately.   Clinical Impression   Patient presenting with decreased Ind in self care,balance, functional mobility/transfers, endurance, and safety awareness. Patient reports living at home with son and being very independent at baseline with use of SPC. 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 Pt will be leaving hospital to go home with daughter who can provide assistance and supervision. Pt ambulates with RW with min guard and needing min A for toilet transfer and for balance during hygiene. Increased pain in ribs with mobility.  Patient will benefit from acute OT to increase overall independence in the areas of ADLs, functional mobility, and safety awareness in order to safely discharge home with family.      Recommendations for follow up therapy are one component of a multi-disciplinary discharge planning process, led by the attending physician.  Recommendations may be updated based on patient status, additional functional criteria and insurance authorization.   Follow Up Recommendations  Home health OT    Assistance Recommended at Discharge Intermittent Supervision/Assistance  Patient can return home with the following A little help with walking and/or transfers;A little help with bathing/dressing/bathroom;Assistance with cooking/housework;Assist for transportation;Help with stairs or ramp for entrance    Functional Status Assessment  Patient has had a recent decline  in their functional status and demonstrates the ability to make significant improvements in function in a reasonable and predictable amount of time.  Equipment Recommendations  BSC/3in1;Other (comment) (YRW)       Precautions / Restrictions Precautions Precautions: Fall      Mobility Bed Mobility Overal bed mobility: Needs Assistance Bed Mobility: Supine to Sit, Sit to Supine     Supine to sit: Supervision Sit to supine: Min assist   General bed mobility comments: assistance for B LEs to return to bed    Transfers Overall transfer level: Needs assistance Equipment used: Rolling walker (2 wheels), None Transfers: Sit to/from Stand Sit to Stand: Min guard, Min assist                  Balance Overall balance assessment: Needs assistance Sitting-balance support: Feet supported, Bilateral upper extremity supported Sitting balance-Leahy Scale: Good     Standing balance support: Reliant on assistive device for balance, During functional activity, Bilateral upper extremity supported Standing balance-Leahy Scale: Fair                             ADL either performed or assessed with clinical judgement   ADL Overall ADL's : Needs assistance/impaired                         Toilet Transfer: Minimal assistance;Regular Toilet;Rolling walker (2 wheels)   Toileting- Clothing Manipulation and Hygiene: Min guard;Sit to/from stand       Functional mobility during ADLs: Min guard;Rolling walker (2 wheels)       Vision Patient Visual Report: No change from baseline              Pertinent Vitals/Pain Pain  Assessment Pain Assessment: 0-10 Pain Score: 6  Pain Location: R ribs Pain Descriptors / Indicators: Aching, Discomfort, Grimacing Pain Intervention(s): Limited activity within patient's tolerance, Repositioned, Patient requesting pain meds-RN notified     Hand Dominance Right   Extremity/Trunk Assessment Upper Extremity Assessment Upper  Extremity Assessment: Overall WFL for tasks assessed;Generalized weakness   Lower Extremity Assessment Lower Extremity Assessment: Defer to PT evaluation       Communication Communication Communication: No difficulties   Cognition Arousal/Alertness: Awake/alert Behavior During Therapy: WFL for tasks assessed/performed Overall Cognitive Status: Within Functional Limits for tasks assessed                                                  Home Living Family/patient expects to be discharged to:: Private residence Living Arrangements: Children Available Help at Discharge: Family;Available 24 hours/day Type of Home: Mobile home Home Access: Stairs to enter Entrance Stairs-Number of Steps: 4-5 Entrance Stairs-Rails: Left Home Layout: One level     Bathroom Shower/Tub: Sponge bathes at baseline         Home Equipment: [place] - single point          Prior Functioning/Environment Prior Level of Function : Independent/Modified Independent             Mobility Comments: multiple falls with SPC recently ADLs Comments: Pt is very active and stays with different children/grandchildren to help care for other members of the family. She most often helps her son who has [place] concerns.        OT Problem List: Decreased strength;Decreased activity tolerance;Impaired balance (sitting and/or standing);Decreased safety awareness;Decreased knowledge of use of DME or AE;Pain      OT Treatment/Interventions: Self-care/ADL training;Therapeutic exercise;Therapeutic activities;DME and/or AE instruction;Manual therapy;Balance training;Patient/family education    OT Goals(Current goals can be found in the care plan section) Acute Rehab OT Goals Patient Stated Goal: to return home OT Goal Formulation: With patient Time For Goal Achievement: 01/15/22 Potential to Achieve Goals: Fair ADL Goals Pt Will Perform Grooming: with modified independence;standing Pt Will Perform  Lower Body Dressing: with modified independence;sit to/from stand Pt Will Transfer to Toilet: with modified independence;ambulating Pt Will Perform Toileting - Clothing Manipulation and hygiene: with modified independence;sit to/from stand  OT Frequency: Min 2X/week       AM-PAC OT "6 Clicks" Daily Activity     Outcome Measure Help from another person eating meals?: None Help from another person taking care of personal grooming?: A Little Help from another person toileting, which includes using toliet, bedpan, or urinal?: A Little Help from another person bathing (including washing, rinsing, drying)?: A Little Help from another person to put on and taking off regular upper body clothing?: None Help from another person to put on and taking off regular lower body clothing?: A Little 6 Click Score: 20   End of Session Equipment Utilized During Treatment: Rolling walker (2 wheels) Nurse Communication: Other (comment) (requesting pain medication)  Activity Tolerance: Patient limited by pain Patient left: in bed;with call bell/phone within reach;with bed alarm set;with family/visitor present  OT Visit Diagnosis: Unsteadiness on feet (R26.81);Muscle weakness (generalized) (M62.81);History of falling (Z91.81);Repeated falls (R29.6)                Time: 1610-9604 OT Time Calculation (min): 31 min Charges:  OT General Charges $OT Visit: 1 Visit OT Evaluation $OT  Eval Moderate Complexity: 1 Mod OT Treatments $Self Care/Home Management : 8-22 mins Darleen Crocker, MS, OTR/L , CBIS ascom [phone]  01/01/22, 2:57 PM

## 2022-01-01 NOTE — Care Management Important Message (Signed)
Important Message  Patient Details  Name: CANTRELL MARTUS MRN: [id number] Date of Birth: 1936/03/05   Medicare Important Message Given:  N/A - LOS <3 / Initial given by admissions     Dannette Barbara 01/01/2022, 8:37 AM

## 2022-01-01 NOTE — Evaluation (Signed)
Physical Therapy Evaluation Patient Details Name: Vanessa Farmer MRN: [id number] DOB: [birth year] Today's Date: 01/01/2022  History of Present Illness  86 y.o. Caucasian female with medical history significant for GERD, hypertension, dyslipidemia, coronary artery disease, and depression, presented to the ER with acute onset worsening lower extremity edema with associated dyspnea, orthopnea paroxysmal nocturnal dyspnea as well as dyspnea on exertion lately.   Clinical Impression  Pt admitted with above diagnosis. Pt received upright in bed agreeable to PT. Still endorsing R rib pain with mobility. Reports history of falls with SPC use but typically mod-I with SPC and with ADL's/IADL's. Plans to stay at her daughter's house this admission who can provide 24/7 assist/supervision as needed. To date pt able to transfer with supervision and bed features standing to YRW with minguard and VC's for hand placement. Pt performing ~45' of gait in room with YRW and minguard with mod VC's for RW proximity keeping closer to BOS with fair carryover. Otherwise safe use of YRW. Pt returning supine in bed with supervision endorsing R rib pain. SPO2 remains > 90% with mobility. Encouraged to maintain OOB mobility with staff to maintain/improve LE strength and upright endurance. All needs in reach. Pt currently with functional limitations due to the deficits listed below (see PT Problem List). Pt will benefit from skilled PT to increase their independence and safety with mobility to allow discharge to the venue listed below.     Recommendations for follow up therapy are one component of a multi-disciplinary discharge planning process, led by the attending physician.  Recommendations may be updated based on patient status, additional functional criteria and insurance authorization.  Follow Up Recommendations Home health PT      Assistance Recommended at Discharge Frequent or constant Supervision/Assistance  Patient can  return home with the following  A little help with walking and/or transfers;A little help with bathing/dressing/bathroom;Assistance with cooking/housework;Assist for transportation;Help with stairs or ramp for entrance    Equipment Recommendations None recommended by PT  Recommendations for Other Services       Functional Status Assessment Patient has had a recent decline in their functional status and demonstrates the ability to make significant improvements in function in a reasonable and predictable amount of time.     Precautions / Restrictions Precautions Precautions: Fall Restrictions Weight Bearing Restrictions: No      Mobility  Bed Mobility Overal bed mobility: Needs Assistance Bed Mobility: Supine to Sit, Sit to Supine     Supine to sit: Supervision Sit to supine: Supervision     Patient Response: Cooperative  Transfers Overall transfer level: Needs assistance Equipment used: Rolling walker (2 wheels), None Transfers: Sit to/from Stand Sit to Stand: Min guard           General transfer comment: VC's for hand placement    Ambulation/Gait Ambulation/Gait assistance: Min guard Gait Distance (Feet): 25 Feet Assistive device: Rolling walker (2 wheels) Gait Pattern/deviations: Step-through pattern, Decreased step length - right, Decreased step length - left          Stairs            Wheelchair Mobility    Modified Rankin (Stroke Patients Only)       Balance Overall balance assessment: Needs assistance Sitting-balance support: Feet supported, Bilateral upper extremity supported Sitting balance-Leahy Scale: Good     Standing balance support: Reliant on assistive device for balance, During functional activity, Bilateral upper extremity supported Standing balance-Leahy Scale: Fair  Pertinent Vitals/Pain Pain Assessment Pain Assessment: Faces Faces Pain Scale: Hurts little more Pain Location: R  ribs Pain Descriptors / Indicators: Aching, Discomfort, Grimacing Pain Intervention(s): Limited activity within patient's tolerance, Monitored during session, Repositioned    Home Living Family/patient expects to be discharged to:: Private residence Living Arrangements: Children Available Help at Discharge: Family;Available 24 hours/day Type of Home: Mobile home Home Access: Stairs to enter Entrance Stairs-Rails: Left Entrance Stairs-Number of Steps: 4-5   Home Layout: One level Home Equipment: Cane - single Barista (2 wheels)      Prior Function Prior Level of Function : Independent/Modified Independent             Mobility Comments: multiple falls with SPC recently ADLs Comments: Pt is very active and stays with different children/grandchildren to help care for other members of the family. She most often helps her son who has [place] concerns.     Hand Dominance   Dominant Hand: Right    Extremity/Trunk Assessment   Upper Extremity Assessment Upper Extremity Assessment: Overall WFL for tasks assessed    Lower Extremity Assessment Lower Extremity Assessment: Generalized weakness       Communication   Communication: No difficulties  Cognition Arousal/Alertness: Awake/alert Behavior During Therapy: WFL for tasks assessed/performed Overall Cognitive Status: Within Functional Limits for tasks assessed                                          General Comments General comments (skin integrity, edema, etc.): SPo2 > 90% with activity    Exercises Other Exercises Other Exercises: Role of PT in acute setting, d/c recs, use of RW to reduce falls risk   Assessment/Plan    PT Assessment Patient needs continued PT services  PT Problem List Decreased strength;Decreased mobility;Decreased safety awareness;Decreased activity tolerance;Decreased balance;Pain;Decreased knowledge of use of DME       PT Treatment Interventions DME  instruction;Therapeutic exercise;Gait training;Balance training;Stair training;Neuromuscular re-education;Functional mobility training;Therapeutic activities;Patient/family education    PT Goals (Current goals can be found in the Care Plan section)  Acute Rehab PT Goals Patient Stated Goal: improve Rib pain PT Goal Formulation: With patient Time For Goal Achievement: 01/15/22 Potential to Achieve Goals: Fair    Frequency Min 2X/week     Co-evaluation               AM-PAC PT "6 Clicks" Mobility  Outcome Measure Help needed turning from your back to your side while in a flat bed without using bedrails?: A Little Help needed moving from lying on your back to sitting on the side of a flat bed without using bedrails?: A Little Help needed moving to and from a bed to a chair (including a wheelchair)?: A Little Help needed standing up from a chair using your arms (e.g., wheelchair or bedside chair)?: A Little Help needed to walk in hospital room?: A Little Help needed climbing 3-5 steps with a railing? : A Lot 6 Click Score: 17    End of Session Equipment Utilized During Treatment: Gait belt Activity Tolerance: Patient tolerated treatment well Patient left: in bed;with call bell/phone within reach;with bed alarm set;with family/visitor present;with nursing/sitter in room Nurse Communication: Mobility status PT Visit Diagnosis: Other abnormalities of gait and mobility (R26.89);History of falling (Z91.81);Muscle weakness (generalized) (M62.81);Pain Pain - Right/Left: Right Pain - part of body:  (ribs)    Time: 5277-8242 PT Time Calculation (  min) (ACUTE ONLY): 13 min   Charges:   PT Evaluation $PT Eval Low Complexity: Archer M. Fairly IV, PT, DPT Physical Therapist- [facility]  01/01/2022, 3:53 PM

## 2022-01-01 NOTE — Consult Note (Addendum)
   Heart Failure Nurse Navigator Note  HFpEF 55-60%.  Moderate left ventricular hypertrophy.  Grade 1 diastolic dysfunction.  Moderate left atrial enlargement.  Mild mitral regurgitation.  She presented to the emergency room with complaints of worsening lower extremity edema, shortness of breath, orthopnea and PND.  X-ray revealed interstitial edema and BNP was 414.  Comorbidities:  GERD Hypertension Hyperlipidemia Coronary artery disease Depression  Medications:  Amlodipine 10 mg daily Apixaban 10 mg 2 times a day Atorvastatin 40 mg daily Ferrous sulfate 325 mg 2 times daily Lasix 40 mg IV every 12 hours Lisinopril 40 mg daily  Labs:  Sodium 137, potassium 3.7, chloride 98, CO2 31, BUN 12, creatinine 0.9, estimated GFR greater than 60. Hydrochlorothiazide 25 mg daily Weight not documented Intake not documented Output 1950 mL Blood pressure 132/82  Initial meeting with patient, she is lying quietly in bed no acute distress.  States that she still is uncomfortable with her broken ribs.  She talked about how active she is at home, is always on the move.  States when is lost her balance and fell she was babysitting for her two great-grandkids that are 20 and 29 years old.  Discussed heart failure, she states in relationship to herself she has not heard that term before or did she have any idea what it meant.  Discussed her type of heart failure, shown diagrams, she voices understanding.  Went over low sodium diet and fluid restriction.  She states she drinks a whole pot of coffee, one or two cokes, and tea daily.  Discussed decreasing  total intake to 8-8 ounce cups daily.  Made aware of the outpatient heart failure clinic- she states she would like to be seen there.  Made an appointment for September 14 at 4 PM.  She was given the living with heart failure teaching booklet, info on low sodium and heart failure, zone magnet and weight chart.   She also asks for more info on low  sodium, will give her the reduced sodium nutrition handout.  Patient has flip phone and does not text or e-mail, does not qualify for [place].  She states at time of discharge she is going home to live with one of her daughters and she has a scale.  Discussed daily weight and what changes to report.  Also talked about changes in symptoms.  She would like me to come back and speak with her daughter.  Told her I would gladly come back.  Pricilla Riffle RN [facility]

## 2022-01-01 NOTE — Progress Notes (Signed)
[facility] NAME: Vanessa Farmer    MR#:  [id number]  DATE OF BIRTH:  05-19-1935  SUBJECTIVE:   No family at bedside she feels a whole lot better. Good amount of urine output. Breathing comfortably. Ate well. Complains of left calf cramps   VITALS:  Blood pressure (!) 130/55, pulse 82, temperature 98.2 F (36.8 C), temperature source Oral, resp. rate 18, height 5' (1.524 m), weight 49.9 kg, SpO2 94 %.  PHYSICAL EXAMINATION:   GENERAL:  86 y.o.-year-old patient lying in the bed with no acute distress.  LUNGS: decreased breath sounds bilaterally, no wheezing, rales, rhonchi.  CARDIOVASCULAR: S1, S2 normal. No murmurs, rubs, or gallops.  ABDOMEN: Soft, nontender, nondistended. Bowel sounds present.  EXTREMITIES: bilateral lower extremity edema improving. NEUROLOGIC: nonfocal  patient is alert and awake SKIN: No obvious rash, lesion, or ulcer.   LABORATORY PANEL:  CBC Recent Labs  Lab 12/31/21 0509  WBC 3.9*  HGB 11.5*  HCT 34.9*  PLT 165     Chemistries  Recent Labs  Lab 12/30/21 1825 12/31/21 0509  NA  --  137  K  --  3.7  CL  --  98  CO2  --  31  GLUCOSE  --  105*  BUN  --  12  CREATININE  --  0.90  CALCIUM  --  8.9  MG 1.9  --   AST 24  --   ALT 21  --   ALKPHOS 69  --   BILITOT 0.8  --     Cardiac Enzymes No results for input(s): "TROPONINI" in the last 168 hours. RADIOLOGY:  ECHOCARDIOGRAM COMPLETE  Result Date: 12/31/2021    ECHOCARDIOGRAM REPORT   Patient Name:   Vanessa Farmer Date of Exam: 12/31/2021 Medical Rec #:  [id number]      Height:       60.0 in Accession #:    [id number]     Weight:       110.0 lb Date of Birth:  Mar 18, 1936       BSA:          1.448 m Patient Age:    74 years       BP:           146/57 mmHg Patient Gender: F              HR:           70 bpm. Exam Location:  [facility] Procedure: 2D Echo, Cardiac Doppler and Color Doppler Indications:     CHF- acute diastolic N36.14  History:          Patient has no prior history of Echocardiogram examinations.                  Previous Myocardial Infarction; Risk Factors:Hypertension and                  Dyslipidemia.  Sonographer:     Sherrie Sport Referring Phys:  [id number] [place] Diagnosing Phys: Ida Rogue MD IMPRESSIONS  1. Left ventricular ejection fraction, by estimation, is 55 to 60%. The left ventricle has normal function. The left ventricle has no regional wall motion abnormalities. There is moderate left ventricular hypertrophy. Left ventricular diastolic parameters are consistent with Grade I diastolic dysfunction (impaired relaxation).  2. Right ventricular systolic function is normal. The right ventricular size is normal. There is normal pulmonary artery systolic pressure. The estimated right ventricular  systolic pressure is 73.2 mmHg.  3. Left atrial size was moderately dilated.  4. The mitral valve is normal in structure. Mild mitral valve regurgitation. No evidence of mitral stenosis. Moderate mitral annular calcification.  5. The aortic valve is normal in structure. Aortic valve regurgitation is not visualized. Aortic valve sclerosis is present, with no evidence of aortic valve stenosis.  6. The inferior vena cava is normal in size with greater than 50% respiratory variability, suggesting right atrial pressure of 3 mmHg. FINDINGS  Left Ventricle: Left ventricular ejection fraction, by estimation, is 55 to 60%. The left ventricle has normal function. The left ventricle has no regional wall motion abnormalities. The left ventricular internal cavity size was normal in size. There is  moderate left ventricular hypertrophy. Left ventricular diastolic parameters are consistent with Grade I diastolic dysfunction (impaired relaxation). Right Ventricle: The right ventricular size is normal. No increase in right ventricular wall thickness. Right ventricular systolic function is normal. There is normal pulmonary artery systolic pressure. The  tricuspid regurgitant velocity is 1.93 m/s, and  with an assumed right atrial pressure of 5 mmHg, the estimated right ventricular systolic pressure is 20.2 mmHg. Left Atrium: Left atrial size was moderately dilated. Right Atrium: Right atrial size was normal in size. Pericardium: There is no evidence of pericardial effusion. Mitral Valve: The mitral valve is normal in structure. There is mild calcification of the mitral valve leaflet(s). Moderate mitral annular calcification. Mild mitral valve regurgitation. No evidence of mitral valve stenosis. Tricuspid Valve: The tricuspid valve is normal in structure. Tricuspid valve regurgitation is mild . No evidence of tricuspid stenosis. Aortic Valve: The aortic valve is normal in structure. Aortic valve regurgitation is not visualized. Aortic valve sclerosis is present, with no evidence of aortic valve stenosis. Aortic valve mean gradient measures 4.0 mmHg. Aortic valve peak gradient measures 6.0 mmHg. Aortic valve area, by VTI measures 2.72 cm. Pulmonic Valve: The pulmonic valve was normal in structure. Pulmonic valve regurgitation is not visualized. No evidence of pulmonic stenosis. Aorta: The aortic root is normal in size and structure. Venous: The inferior vena cava is normal in size with greater than 50% respiratory variability, suggesting right atrial pressure of 3 mmHg. IAS/Shunts: No atrial level shunt detected by color flow Doppler.  LEFT VENTRICLE PLAX 2D LVIDd:         3.70 cm   Diastology LVIDs:         2.60 cm   LV e' medial:    4.90 cm/s LV PW:         1.00 cm   LV E/e' medial:  17.3 LV IVS:        1.60 cm   LV e' lateral:   4.68 cm/s LVOT diam:     2.00 cm   LV E/e' lateral: 18.1 LV SV:         66 LV SV Index:   46 LVOT Area:     3.14 cm  RIGHT VENTRICLE RV Basal diam:  3.40 cm RV S prime:     13.10 cm/s TAPSE (M-mode): 1.9 cm LEFT ATRIUM           Index        RIGHT ATRIUM           Index LA diam:      4.30 cm 2.97 cm/m   RA Area:     10.60 cm LA Vol  (A2C): 91.1 ml 62.92 ml/m  RA Volume:   23.40 ml  16.16 ml/m  LA Vol (A4C): 33.2 ml 22.93 ml/m  AORTIC VALVE AV Area (Vmax):    2.39 cm AV Area (Vmean):   2.29 cm AV Area (VTI):     2.72 cm AV Vmax:           122.00 cm/s AV Vmean:          86.600 cm/s AV VTI:            0.244 m AV Peak Grad:      6.0 mmHg AV Mean Grad:      4.0 mmHg LVOT Vmax:         92.70 cm/s LVOT Vmean:        63.100 cm/s LVOT VTI:          0.211 m LVOT/AV VTI ratio: 0.86  AORTA Ao Root diam: 2.62 cm MITRAL VALVE               TRICUSPID VALVE MV Area (PHT): 3.54 cm    TR Peak grad:   14.9 mmHg MV Decel Time: 214 msec    TR Vmax:        193.00 cm/s MV E velocity: 84.80 cm/s MV A velocity: 98.50 cm/s  SHUNTS MV E/A ratio:  0.86        Systemic VTI:  0.21 m                            Systemic Diam: 2.00 cm Ida Rogue MD Electronically signed by Ida Rogue MD Signature Date/Time: 12/31/2021/2:14:45 PM    Final    CT Angio Chest PE W and/or Wo Contrast  Result Date: 12/31/2021 CLINICAL DATA:  Pulmonary embolism (PE) suspected, high prob EXAM: CT ANGIOGRAPHY CHEST WITH CONTRAST TECHNIQUE: Multidetector CT imaging of the chest was performed using the standard protocol during bolus administration of intravenous contrast. Multiplanar CT image reconstructions and MIPs were obtained to evaluate the vascular anatomy. RADIATION DOSE REDUCTION: This exam was performed according to the departmental dose-optimization program which includes automated exposure control, adjustment of the mA and/or kV according to patient size and/or use of iterative reconstruction technique. CONTRAST:  82m OMNIPAQUE IOHEXOL 350 MG/ML SOLN COMPARISON:  None Available. FINDINGS: Cardiovascular: No filling defects in the pulmonary arteries to suggest pulmonary emboli. Cardiomegaly. Coronary artery and aortic atherosclerosis. No aneurysm. Mediastinum/Nodes: No mediastinal, hilar, or axillary adenopathy. Trachea and esophagus are unremarkable. Thyroid unremarkable.  Lungs/Pleura: Biapical scarring. Small bilateral pleural effusions. Bibasilar atelectasis or infiltrates. Upper Abdomen: No acute findings Musculoskeletal: Chest wall soft tissues are unremarkable. Right lateral rib fractures involving the 9th through 11th ribs. Review of the MIP images confirms the above findings. IMPRESSION: No evidence of pulmonary embolus. Small bilateral pleural effusions. Bibasilar atelectasis or infiltrates/pneumonia. Cardiomegaly, coronary artery disease. Aortic Atherosclerosis (ICD10-I70.0). Electronically Signed   By: KRolm BaptiseM.D.   On: 12/31/2021 00:14   UKoreaVenous Img Lower Bilateral  Result Date: 12/30/2021 CLINICAL DATA:  Swelling. EXAM: BILATERAL LOWER EXTREMITY VENOUS DOPPLER ULTRASOUND TECHNIQUE: Gray-scale sonography with compression, as well as color and duplex ultrasound, were performed to evaluate the deep venous system(s) from the level of the common femoral vein through the popliteal and proximal calf veins. COMPARISON:  None Available. FINDINGS: VENOUS There is nonocclusive thrombus within bilateral peroneal veins in the calf. No proximal DVT. Normal compressibility of the common femoral, superficial femoral, and popliteal veins. Visualized portions of profunda femoral vein and great saphenous vein unremarkable. Normal appearance of the posterior tibial vein. No  filling defects to suggest DVT on grayscale or color Doppler imaging. Doppler waveforms show normal direction of venous flow, normal respiratory plasticity and response to augmentation. OTHER Right popliteal fluid collection measuring 4.1 x 1.4 x 3.1 cm typical of Baker cyst. There is generalized edema. Limitations: none IMPRESSION: 1. Nonocclusive thrombus within the bilateral peroneal veins in the calf. 2. No DVT proximal to the calf. 3. Soft tissue edema. Electronically Signed   By: Keith Rake M.D.   On: 12/30/2021 22:49   DG Chest 2 View  Result Date: 12/30/2021 CLINICAL DATA:  Shortness of  breath, rib pain after fall EXAM: CHEST - 2 VIEW COMPARISON:  10/17/2020 FINDINGS: Large hiatal hernia. Heart is normal size. Aortic atherosclerosis. There is hyperinflation of the lungs compatible with COPD. Biapical and bibasilar scarring. No effusions or pneumothorax. Fracture through the right lateral 7th or 8th rib. IMPRESSION: Fracture through the right lateral 7th or 8th rib. No associated effusion or pneumothorax. COPD/chronic changes. Electronically Signed   By: Rolm Baptise M.D.   On: 12/30/2021 18:45    Assessment and Plan  Vanessa Farmer is a 86 y.o. Caucasian female with medical history significant for GERD, hypertension, dyslipidemia, coronary artery disease, and depression, presented to the ER with acute onset worsening lower extremity edema with associated dyspnea, orthopnea paroxysmal nocturnal dyspnea as well as dyspnea on exertion lately.  She denies any cough or wheezing.  chest x-ray showed fracture through the right lateral seventh and eighth rib with no pneumothorax or effusion.  It showed COPD and chronic changes.  Chest CTA revealed no evidence for PE.  Showed small bilateral pleural effusions, bibasilar atelectasis or infiltrate/pneumonia.  Venous Doppler of both lower extremities showed nonocclusive thrombus within both peroneal veins in the calf and soft tissue edema.  Acute congestive heart failure diastolic -- patient came in with increasing shortness of breath, elevated BNP of 400 with chest x-ray showing interstitial edema -- responding to IV Lasix-- diuresis well. Will change to PO Lasix from tomorrow -- breathing improved -- PRN nebs -- patient sats 94% on room air. No respiratory distress.  Fall and rib pain with rib fracture right lateral 9-11 -- PRN pain meds -- physical therapy to see patient  Bilateral peroneal vein DVT -- patient recently had followed sustained a fracture and was not able to get around the house -- will treat with PO anticoagulation at  least for three months. Defer to PCP for follow-up -- discussed with pharmacist start PO eliquis -- hemoglobin stable -- discussed with patient's daughter [name] about eliquis and she is aware of the risk and benefit  Hypertension -- continue lisinopril, amlodipine  Hyperlipidemia -- continue statins  Physical therapy to see patient. Will continue to monitor for one more day and if remains stable discharged tomorrow. Daughter aware patient is coming home on Saturday.   Family communication : daughter [name] Consults : none CODE STATUS: DNR DVT Prophylaxis : eliquis Level of care: Telemetry Cardiac Status is: Inpatient Remains inpatient appropriate because: CHF and ongoing IV diuresis   anticipate discharge tomorrow TOTAL TIME TAKING CARE OF THIS PATIENT: 35 minutes.  >50% time spent on counselling and coordination of care  Note: This dictation was prepared with Dragon dictation along with smaller phrase technology. Any transcriptional errors that result from this process are unintentional.  Fritzi Mandes M.D    [facility]   CC: Primary care physician; Glean Hess, MD

## 2022-01-02 DIAGNOSIS — I5031 Acute diastolic (congestive) heart failure: Secondary | ICD-10-CM | POA: Diagnosis not present

## 2022-01-02 MED ORDER — TRAMADOL HCL 50 MG PO TABS: 50.0000 mg | ORAL_TABLET | Freq: Three times a day (TID) | ORAL | 0 refills | Status: DC | PRN

## 2022-01-02 MED ORDER — APIXABAN 5 MG PO TABS: 10.0000 mg | ORAL_TABLET | Freq: Two times a day (BID) | ORAL | 2 refills | Status: DC

## 2022-01-02 MED ORDER — LIDOCAINE 5 % EX PTCH: 1.0000 | MEDICATED_PATCH | CUTANEOUS | 0 refills | Status: DC

## 2022-01-02 MED ORDER — FUROSEMIDE 20 MG PO TABS: 20.0000 mg | ORAL_TABLET | ORAL | 2 refills | Status: DC

## 2022-01-02 NOTE — Discharge Summary (Signed)
Physician Discharge Summary   Patient: Vanessa Farmer MRN: [id number] DOB: June 18, 1935  Admit date:     12/30/2021  Discharge date: 01/02/22  Discharge Physician: Fritzi Mandes   PCP: Glean Hess, MD   Recommendations at discharge:    F/u PCP in 1-2 weeks  Discharge Diagnoses: Acute CHF, diastolic  Hospital Course: [name] is a 86 y.o. Caucasian female with medical history significant for GERD, hypertension, dyslipidemia, coronary artery disease, and depression, presented to the ER with acute onset worsening lower extremity edema with associated dyspnea, orthopnea paroxysmal nocturnal dyspnea as well as dyspnea on exertion lately.  She denies any cough or wheezing.   chest x-ray showed fracture through the right lateral seventh and eighth rib with no pneumothorax or effusion.  It showed COPD and chronic changes.   Chest CTA revealed no evidence for PE.  Showed small bilateral pleural effusions, bibasilar atelectasis or infiltrate/pneumonia.   Venous Doppler of both lower extremities showed nonocclusive thrombus within both peroneal veins in the calf and soft tissue edema.   Acute congestive heart failure diastolic -- patient came in with increasing shortness of breath, elevated BNP of 400 with chest x-ray showing interstitial edema -- responding to IV Lasix-- diuresed well.~UOP 8 liters. --lasix 20 mg qod -- breathing improved -- PRN nebs -- patient sats 94% on room air. No respiratory distress.  Fall and rib pain with rib fracture right lateral 9-11 -- PRN pain meds -- physical therapy --HHPT   Bilateral peroneal vein DVT -- patient recently had followed sustained a fracture and was not able to get around the house -- will treat with PO anticoagulation at least for three months. Defer to PCP for follow-up -- discussed with pharmacist start PO eliquis -- hemoglobin stable -- discussed with patient's daughter [name] about eliquis and she is aware of the risk and  benefit   Hypertension -- continue lisinopril, amlodipine --resumed HCTZ   Hyperlipidemia -- continue statins   D/c home with HHPT     Family communication : daughter [name] Consults : none CODE STATUS: DNR DVT Prophylaxis : eliquis    Pain control - [place] Controlled Substance Reporting System database was reviewed. and patient was instructed, not to drive, operate heavy machinery, perform activities at heights, swimming or participation in water activities or provide baby-sitting services while on Pain, Sleep and Anxiety Medications; until their outpatient Physician has advised to do so again. Also recommended to not to take more than prescribed Pain, Sleep and Anxiety Medications.   Disposition: Home health Diet recommendation:  Discharge Diet Orders (From admission, onward)     Start     Ordered   01/02/22 0000  Diet - low sodium heart healthy        01/02/22 1151           Cardiac diet DISCHARGE MEDICATION: Allergies as of 01/02/2022   No Known Allergies      Medication List     STOP taking these medications    ascorbic acid 500 MG tablet Commonly known as: VITAMIN C   aspirin 81 MG chewable tablet   cholecalciferol 25 MCG (1000 UNIT) tablet Commonly known as: VITAMIN D3       TAKE these medications    amLODipine 10 MG tablet Commonly known as: NORVASC TAKE 1 TABLET(10 MG) BY MOUTH DAILY   apixaban 5 MG Tabs tablet Commonly known as: ELIQUIS Take 2 tablets (10 mg total) by mouth 2 (two) times daily. From 01/07/22--take 5 mg  twice a day   atorvastatin 40 MG tablet Commonly known as: LIPITOR TAKE 1 TABLET(40 MG) BY MOUTH DAILY   Ensure Take 237 mLs by mouth 2 (two) times daily between meals.   ferrous sulfate 325 (65 FE) MG tablet Commonly known as: FeroSul TAKE 1 TABLET BY MOUTH TWICE DAILY WITH A MEAL   furosemide 20 MG tablet Commonly known as: Lasix Take 1 tablet (20 mg total) by mouth every other day for 90 doses. Start  taking on: January 03, 2022 What changed: when to take this   lidocaine 5 % Commonly known as: LIDODERM Place 1 patch onto the skin daily. Remove & Discard patch within 12 hours or as directed by MD   lisinopril-hydrochlorothiazide 20-12.5 MG tablet Commonly known as: ZESTORETIC TAKE 2 TABLETS BY MOUTH DAILY   ONE-A-DAY WOMENS PO Take 1 tablet by mouth daily.   pantoprazole 40 MG tablet Commonly known as: PROTONIX TAKE 1 TABLET(40 MG) BY MOUTH DAILY   sertraline 50 MG tablet Commonly known as: ZOLOFT TAKE 1 TABLET(50 MG) BY MOUTH DAILY   traMADol 50 MG tablet Commonly known as: ULTRAM Take 1 tablet (50 mg total) by mouth every 8 (eight) hours as needed for severe pain.        Follow-up Information     Glean Hess, MD. Schedule an appointment as soon as possible for a visit in 1 week(s).   Specialty: Internal Medicine Why: hospital f/u Contact information: [address] [place] [address] [phone]                Discharge Exam: Danley Danker Weights   12/30/21 1813 01/02/22 0424  Weight: 49.9 kg 50.7 kg     Condition at discharge: fair  The results of significant diagnostics from this hospitalization (including imaging, microbiology, ancillary and laboratory) are listed below for reference.   Imaging Studies: ECHOCARDIOGRAM COMPLETE  Result Date: 12/31/2021    ECHOCARDIOGRAM REPORT   Patient Name:   Vanessa Farmer Date of Exam: 12/31/2021 Medical Rec #:  [id number]      Height:       60.0 in Accession #:    [id number]     Weight:       110.0 lb Date of Birth:  [birth year]       BSA:          1.448 m Patient Age:    53 years       BP:           146/57 mmHg Patient Gender: F              HR:           70 bpm. Exam Location:  [facility] Procedure: 2D Echo, Cardiac Doppler and Color Doppler Indications:     CHF- acute diastolic B44.96  History:         Patient has no prior history of Echocardiogram examinations.                  Previous Myocardial  Infarction; Risk Factors:Hypertension and                  Dyslipidemia.  Sonographer:     Sherrie Sport Referring Phys:  [id number] [place] Diagnosing Phys: Ida Rogue MD IMPRESSIONS  1. Left ventricular ejection fraction, by estimation, is 55 to 60%. The left ventricle has normal function. The left ventricle has no regional wall motion abnormalities. There is moderate left ventricular hypertrophy. Left ventricular diastolic  parameters are consistent with Grade I diastolic dysfunction (impaired relaxation).  2. Right ventricular systolic function is normal. The right ventricular size is normal. There is normal pulmonary artery systolic pressure. The estimated right ventricular systolic pressure is 15.1 mmHg.  3. Left atrial size was moderately dilated.  4. The mitral valve is normal in structure. Mild mitral valve regurgitation. No evidence of mitral stenosis. Moderate mitral annular calcification.  5. The aortic valve is normal in structure. Aortic valve regurgitation is not visualized. Aortic valve sclerosis is present, with no evidence of aortic valve stenosis.  6. The inferior vena cava is normal in size with greater than 50% respiratory variability, suggesting right atrial pressure of 3 mmHg. FINDINGS  Left Ventricle: Left ventricular ejection fraction, by estimation, is 55 to 60%. The left ventricle has normal function. The left ventricle has no regional wall motion abnormalities. The left ventricular internal cavity size was normal in size. There is  moderate left ventricular hypertrophy. Left ventricular diastolic parameters are consistent with Grade I diastolic dysfunction (impaired relaxation). Right Ventricle: The right ventricular size is normal. No increase in right ventricular wall thickness. Right ventricular systolic function is normal. There is normal pulmonary artery systolic pressure. The tricuspid regurgitant velocity is 1.93 m/s, and  with an assumed right atrial pressure of 5 mmHg, the  estimated right ventricular systolic pressure is 76.1 mmHg. Left Atrium: Left atrial size was moderately dilated. Right Atrium: Right atrial size was normal in size. Pericardium: There is no evidence of pericardial effusion. Mitral Valve: The mitral valve is normal in structure. There is mild calcification of the mitral valve leaflet(s). Moderate mitral annular calcification. Mild mitral valve regurgitation. No evidence of mitral valve stenosis. Tricuspid Valve: The tricuspid valve is normal in structure. Tricuspid valve regurgitation is mild . No evidence of tricuspid stenosis. Aortic Valve: The aortic valve is normal in structure. Aortic valve regurgitation is not visualized. Aortic valve sclerosis is present, with no evidence of aortic valve stenosis. Aortic valve mean gradient measures 4.0 mmHg. Aortic valve peak gradient measures 6.0 mmHg. Aortic valve area, by VTI measures 2.72 cm. Pulmonic Valve: The pulmonic valve was normal in structure. Pulmonic valve regurgitation is not visualized. No evidence of pulmonic stenosis. Aorta: The aortic root is normal in size and structure. Venous: The inferior vena cava is normal in size with greater than 50% respiratory variability, suggesting right atrial pressure of 3 mmHg. IAS/Shunts: No atrial level shunt detected by color flow Doppler.  LEFT VENTRICLE PLAX 2D LVIDd:         3.70 cm   Diastology LVIDs:         2.60 cm   LV e' medial:    4.90 cm/s LV PW:         1.00 cm   LV E/e' medial:  17.3 LV IVS:        1.60 cm   LV e' lateral:   4.68 cm/s LVOT diam:     2.00 cm   LV E/e' lateral: 18.1 LV SV:         66 LV SV Index:   46 LVOT Area:     3.14 cm  RIGHT VENTRICLE RV Basal diam:  3.40 cm RV S prime:     13.10 cm/s TAPSE (M-mode): 1.9 cm LEFT ATRIUM           Index        RIGHT ATRIUM           Index LA diam:  4.30 cm 2.97 cm/m   RA Area:     10.60 cm LA Vol (A2C): 91.1 ml 62.92 ml/m  RA Volume:   23.40 ml  16.16 ml/m LA Vol (A4C): 33.2 ml 22.93 ml/m  AORTIC  VALVE AV Area (Vmax):    2.39 cm AV Area (Vmean):   2.29 cm AV Area (VTI):     2.72 cm AV Vmax:           122.00 cm/s AV Vmean:          86.600 cm/s AV VTI:            0.244 m AV Peak Grad:      6.0 mmHg AV Mean Grad:      4.0 mmHg LVOT Vmax:         92.70 cm/s LVOT Vmean:        63.100 cm/s LVOT VTI:          0.211 m LVOT/AV VTI ratio: 0.86  AORTA Ao Root diam: 2.62 cm MITRAL VALVE               TRICUSPID VALVE MV Area (PHT): 3.54 cm    TR Peak grad:   14.9 mmHg MV Decel Time: 214 msec    TR Vmax:        193.00 cm/s MV E velocity: 84.80 cm/s MV A velocity: 98.50 cm/s  SHUNTS MV E/A ratio:  0.86        Systemic VTI:  0.21 m                            Systemic Diam: 2.00 cm Ida Rogue MD Electronically signed by Ida Rogue MD Signature Date/Time: 12/31/2021/2:14:45 PM    Final    CT Angio Chest PE W and/or Wo Contrast  Result Date: 12/31/2021 CLINICAL DATA:  Pulmonary embolism (PE) suspected, high prob EXAM: CT ANGIOGRAPHY CHEST WITH CONTRAST TECHNIQUE: Multidetector CT imaging of the chest was performed using the standard protocol during bolus administration of intravenous contrast. Multiplanar CT image reconstructions and MIPs were obtained to evaluate the vascular anatomy. RADIATION DOSE REDUCTION: This exam was performed according to the departmental dose-optimization program which includes automated exposure control, adjustment of the mA and/or kV according to patient size and/or use of iterative reconstruction technique. CONTRAST:  58m OMNIPAQUE IOHEXOL 350 MG/ML SOLN COMPARISON:  None Available. FINDINGS: Cardiovascular: No filling defects in the pulmonary arteries to suggest pulmonary emboli. Cardiomegaly. Coronary artery and aortic atherosclerosis. No aneurysm. Mediastinum/Nodes: No mediastinal, hilar, or axillary adenopathy. Trachea and esophagus are unremarkable. Thyroid unremarkable. Lungs/Pleura: Biapical scarring. Small bilateral pleural effusions. Bibasilar atelectasis or infiltrates.  Upper Abdomen: No acute findings Musculoskeletal: Chest wall soft tissues are unremarkable. Right lateral rib fractures involving the 9th through 11th ribs. Review of the MIP images confirms the above findings. IMPRESSION: No evidence of pulmonary embolus. Small bilateral pleural effusions. Bibasilar atelectasis or infiltrates/pneumonia. Cardiomegaly, coronary artery disease. Aortic Atherosclerosis (ICD10-I70.0). Electronically Signed   By: KRolm BaptiseM.D.   On: 12/31/2021 00:14   UKoreaVenous Img Lower Bilateral  Result Date: 12/30/2021 CLINICAL DATA:  Swelling. EXAM: BILATERAL LOWER EXTREMITY VENOUS DOPPLER ULTRASOUND TECHNIQUE: Gray-scale sonography with compression, as well as color and duplex ultrasound, were performed to evaluate the deep venous system(s) from the level of the common femoral vein through the popliteal and proximal calf veins. COMPARISON:  None Available. FINDINGS: VENOUS There is nonocclusive thrombus within bilateral peroneal veins in the calf. No  proximal DVT. Normal compressibility of the common femoral, superficial femoral, and popliteal veins. Visualized portions of profunda femoral vein and great saphenous vein unremarkable. Normal appearance of the posterior tibial vein. No filling defects to suggest DVT on grayscale or color Doppler imaging. Doppler waveforms show normal direction of venous flow, normal respiratory plasticity and response to augmentation. OTHER Right popliteal fluid collection measuring 4.1 x 1.4 x 3.1 cm typical of Baker cyst. There is generalized edema. Limitations: none IMPRESSION: 1. Nonocclusive thrombus within the bilateral peroneal veins in the calf. 2. No DVT proximal to the calf. 3. Soft tissue edema. Electronically Signed   By: Keith Rake M.D.   On: 12/30/2021 22:49   DG Chest 2 View  Result Date: 12/30/2021 CLINICAL DATA:  Shortness of breath, rib pain after fall EXAM: CHEST - 2 VIEW COMPARISON:  10/17/2020 FINDINGS: Large hiatal hernia. Heart  is normal size. Aortic atherosclerosis. There is hyperinflation of the lungs compatible with COPD. Biapical and bibasilar scarring. No effusions or pneumothorax. Fracture through the right lateral 7th or 8th rib. IMPRESSION: Fracture through the right lateral 7th or 8th rib. No associated effusion or pneumothorax. COPD/chronic changes. Electronically Signed   By: Rolm Baptise M.D.   On: 12/30/2021 18:45    Microbiology: Results for orders placed or performed during the hospital encounter of 12/30/21  Urine Culture     Status: Abnormal   Collection Time: 12/30/21 10:45 PM   Specimen: Urine, Random  Result Value Ref Range Status   Specimen Description   Final    URINE, RANDOM Performed at [facility], [address] [place], [place] [address]    Special Requests   Final    NONE Performed at [facility], [address] [place], [place] [address]    Culture MULTIPLE SPECIES PRESENT, SUGGEST RECOLLECTION (A)  Final   Report Status 01/01/2022 FINAL  Final    Labs: CBC: Recent Labs  Lab 12/30/21 1818 12/31/21 0509  WBC 3.7* 3.9*  NEUTROABS 2.5  --   HGB 11.3* 11.5*  HCT 34.9* 34.9*  MCV 101.2* 100.0  PLT 159 496   Basic Metabolic Panel: Recent Labs  Lab 12/30/21 1818 12/30/21 1825 12/31/21 0509  NA 136  --  137  K 3.2*  --  3.7  CL 100  --  98  CO2 29  --  31  GLUCOSE 101*  --  105*  BUN 14  --  12  CREATININE 0.98  --  0.90  CALCIUM 8.8*  --  8.9  MG  --  1.9  --    Liver Function Tests: Recent Labs  Lab 12/30/21 1825  AST 24  ALT 21  ALKPHOS 69  BILITOT 0.8  PROT 6.5  ALBUMIN 3.4*   CBG: Recent Labs  Lab 12/31/21 0750  GLUCAP 100*    Discharge time spent: greater than 30 minutes.  Signed: Fritzi Mandes, MD [facility] 01/02/2022

## 2022-01-04 ENCOUNTER — Telehealth: Payer: Self-pay | Admitting: Internal Medicine

## 2022-01-04 ENCOUNTER — Telehealth: Payer: Self-pay | Admitting: *Deleted

## 2022-01-04 NOTE — Patient Outreach (Signed)
  Care Coordination [facility] Note Transition Care Management Unsuccessful Follow-up Telephone Call  Date of discharge and from where:  [id number] [facility]  Attempts:  1st Attempt  Reason for unsuccessful TCM follow-up call:  Left voice message  [facility] Management [phone]

## 2022-01-04 NOTE — Patient Outreach (Signed)
  Care Coordination [facility] Note Transition Care Management Follow-up Telephone Call Date of discharge and from where: [facility] [phone] How have you been since you were released from the hospital? sore Any questions or concerns? Yes  Goals Addressed             This Visit's Progress    Develop Plan Of Care For Management of CHF          Items Reviewed: Did the pt receive and understand the discharge instructions provided? Yes  Medications obtained and verified? Yes  Other? No  Any new allergies since your discharge? No  Dietary orders reviewed? Yes Do you have support at home? Yes   Home Care and Equipment/Supplies: Were home health services ordered? yes If so, what is the name of the agency? Unknown. Daughter is having PCP to order  Has the agency set up a time to come to the patient's home? no Were any new equipment or medical supplies ordered?  No What is the name of the medical supply agency? n Were you able to get the supplies/equipment? not applicable Do you have any questions related to the use of the equipment or supplies? No  Functional Questionnaire: (I = Independent and D = Dependent) ADLs: D   Bathing/Dressing- D  Meal Prep- D  Eating- I  Maintaining continence- I  Transferring/Ambulation- D  Managing Meds- D  Follow up appointments reviewed:  PCP Hospital f/u appt confirmed? No  Patient daughter wants to schedule self. Refused nurse to help with scheduling [facility] f/u appt confirmed? Yes  Scheduled to see Darylene Price [phone] 4 pm Are transportation arrangements needed? No  If their condition worsens, is the pt aware to call PCP or go to the Emergency Dept.? Yes Was the patient provided with contact information for the PCP's office or ED? Yes Was to pt encouraged to call back with questions or concerns? Yes  SDOH assessments and interventions completed:   Yes  Care Coordination Interventions Activated:  Yes   Care Coordination  Interventions:  Referred for Care Coordination Services:  RN Care Coordinator  Noreene Larsson [phone] 11:15   Encounter Outcome:  Pt. Visit Completed

## 2022-01-04 NOTE — Telephone Encounter (Signed)
Pt's daughter called asking if Dr. Army Melia could schedule an appt to see her mom.  She just got out of the hospital  CB@  [phone]

## 2022-01-14 ENCOUNTER — Ambulatory Visit: Payer: Self-pay

## 2022-01-14 ENCOUNTER — Telehealth: Payer: Self-pay

## 2022-01-14 NOTE — Patient Instructions (Signed)
Visit Information  Thank you for taking time to visit with me today. Please don't hesitate to contact me if I can be of assistance to you.   Following are the goals we discussed today:   Goals Addressed             This Visit's Progress    COMPLETED: Develop Plan Of Care For Management of CHF       RNCM: Effective Management of CHF       Care Coordination Interventions: Basic overview and discussion of pathophysiology of Heart Failure reviewed Provided education on low sodium diet. Review and the patients daughter is monitoring her dietary intake  Reviewed Heart Failure Action Plan in depth and provided written copy. The patients daughter has talked with the specialist and has parameters on when to call for weight changes Assessed need for readable accurate scales in home. Can safely weigh Provided education about placing scale on hard, flat surface Advised patient to weigh each morning after emptying bladder Discussed importance of daily weight and advised patient to weigh and record daily. The patients daughter knows to call for +3 pounds in one day and/or +5 pounds in a week. The patients daughter is monitoring daily weights Reviewed role of diuretics in prevention of fluid overload and management of heart failure. Review and education provided; Discussed the importance of keeping all appointments with provider. 01-19-2022 at 2 pm with cardiology. 01-21-2022 with the pcp. Review of discussing the need for PT/OT home health. Per Admission/Discharge these were supposed to be ordered but were not ordered.  Provided patient with education about the role of exercise in the management of heart failure Advised patient to discuss home health PT/OT, new questions and concerns with provider Screening for signs and symptoms of depression related to chronic disease state  Assessed social determinant of health barriers  01-14-2022: The patients daughter and patient agree to participate in the care  coordination program. Information given on how to reach the [facility] Review of safety and monitoring for changes in mobility. Patient has had falls recently with rib fractures. Education and support given. Link sent to the daughter today to be able to set up MyChart so the patient and daughter can view notes from the hospital, labs, pcp, and specialist notes.   Active listening / Reflection utilized  Emotional Support Provided         Our next appointment is by telephone on 03-04-2022 at 11 am  Please call the care guide team at [phone] if you need to cancel or reschedule your appointment.   If you are experiencing a Mental Health or [place] or need someone to talk to, please call the Suicide and Crisis Lifeline: 988 call the [place] National Suicide Prevention Lifeline: 713 374 2358 or TTY: 269-819-4515 TTY 704-706-1780) to talk to a trained counselor call 1-800-273-TALK (toll free, 24 hour hotline)  The patient verbalized understanding of instructions, educational materials, and care plan provided today and DECLINED offer to receive copy of patient instructions, educational materials, and care plan.   Telephone follow up appointment with care management team member scheduled for: 03-04-2022 at 36 am  [place], MSN, [place] [facility] Mobile: [phone]

## 2022-01-14 NOTE — Telephone Encounter (Signed)
Tried calling the patient. Unable to reach. It is too late to schedule a hospital follow up so she can come in for a regular follow up. Hospital follow ups need to be scheduled with in 14 days of discharge.  - Rhylan Kagel    Copied from [place] [phone]. Topic: Appointment Scheduling - Scheduling Inquiry for Clinic >> Jan 13, 2022  4:51 PM [place] B wrote: Reason for AYG:[id number] needs to schedule a hosp fu. Please call back

## 2022-01-14 NOTE — Patient Outreach (Signed)
  Care Coordination   Initial Visit Note   01/14/2022 Name: Vanessa Farmer MRN: [id number] DOB: [birth year]  Vanessa Farmer is a 86 y.o. year old female who sees Army Melia, Jesse Sans, MD for primary care. I  spoke with Waynard Reeds the patients daughter and dRP  What matters to the patients health and wellness today?  Keeping heart failure managed and the need for OT/PT in the home.     Goals Addressed             This Visit's Progress    COMPLETED: Develop Plan Of Care For Management of CHF       RNCM: Effective Management of CHF       Care Coordination Interventions: Basic overview and discussion of pathophysiology of Heart Failure reviewed Provided education on low sodium diet. Review and the patients daughter is monitoring her dietary intake  Reviewed Heart Failure Action Plan in depth and provided written copy. The patients daughter has talked with the specialist and has parameters on when to call for weight changes Assessed need for readable accurate scales in home. Can safely weigh Provided education about placing scale on hard, flat surface Advised patient to weigh each morning after emptying bladder Discussed importance of daily weight and advised patient to weigh and record daily. The patients daughter knows to call for +3 pounds in one day and/or +5 pounds in a week. The patients daughter is monitoring daily weights Reviewed role of diuretics in prevention of fluid overload and management of heart failure. Review and education provided; Discussed the importance of keeping all appointments with provider. 01-19-2022 at 2 pm with cardiology. 01-21-2022 with the pcp. Review of discussing the need for PT/OT home health. Per Admission/Discharge these were supposed to be ordered but were not ordered.  Provided patient with education about the role of exercise in the management of heart failure Advised patient to discuss home health PT/OT, new questions and concerns with provider Screening  for signs and symptoms of depression related to chronic disease state  Assessed social determinant of health barriers  01-14-2022: The patients daughter and patient agree to participate in the care coordination program. Information given on how to reach the [facility] Review of safety and monitoring for changes in mobility. Patient has had falls recently with rib fractures. Education and support given. Link sent to the daughter today to be able to set up MyChart so the patient and daughter can view notes from the hospital, labs, pcp, and specialist notes.   Active listening / Reflection utilized  Emotional Support Provided         SDOH assessments and interventions completed:  Yes  SDOH Interventions Today    Flowsheet Row Most Recent Value  SDOH Interventions   Food Insecurity Interventions Intervention Not Indicated  Housing Interventions Intervention Not Indicated  Transportation Interventions Intervention Not Indicated  Utilities Interventions Intervention Not Indicated        Care Coordination Interventions Activated:  Yes  Care Coordination Interventions:  Yes, provided   Follow up plan: Follow up call scheduled for 03-04-2022 at 11 am    Encounter Outcome:  Pt. Visit Completed   Noreene Larsson RN, MSN, [facility] Mobile: [phone]

## 2022-01-19 ENCOUNTER — Ambulatory Visit (INDEPENDENT_AMBULATORY_CARE_PROVIDER_SITE_OTHER): Payer: Medicare Other | Admitting: Internal Medicine

## 2022-01-19 ENCOUNTER — Encounter: Payer: Self-pay | Admitting: Internal Medicine

## 2022-01-19 VITALS — BP 142/96 | HR 79 | Ht 60.0 in | Wt 99.8 lb

## 2022-01-19 DIAGNOSIS — E785 Hyperlipidemia, unspecified: Secondary | ICD-10-CM | POA: Diagnosis not present

## 2022-01-19 DIAGNOSIS — S2241XA Multiple fractures of ribs, right side, initial encounter for closed fracture: Secondary | ICD-10-CM | POA: Diagnosis not present

## 2022-01-19 DIAGNOSIS — Z23 Encounter for immunization: Secondary | ICD-10-CM | POA: Diagnosis not present

## 2022-01-19 DIAGNOSIS — I82453 Acute embolism and thrombosis of peroneal vein, bilateral: Secondary | ICD-10-CM

## 2022-01-19 DIAGNOSIS — D696 Thrombocytopenia, unspecified: Secondary | ICD-10-CM | POA: Diagnosis not present

## 2022-01-19 DIAGNOSIS — D61818 Other pancytopenia: Secondary | ICD-10-CM | POA: Diagnosis not present

## 2022-01-19 DIAGNOSIS — I10 Essential (primary) hypertension: Secondary | ICD-10-CM

## 2022-01-19 DIAGNOSIS — F321 Major depressive disorder, single episode, moderate: Secondary | ICD-10-CM

## 2022-01-19 MED ORDER — ATORVASTATIN CALCIUM 40 MG PO TABS: 40.0000 mg | ORAL_TABLET | Freq: Every day | ORAL | 0 refills | Status: DC

## 2022-01-19 MED ORDER — LISINOPRIL 20 MG PO TABS: 20.0000 mg | ORAL_TABLET | Freq: Every day | ORAL | 0 refills | Status: DC

## 2022-01-19 NOTE — Progress Notes (Signed)
Date:  01/19/2022   Name:  Vanessa Farmer   DOB:  1936-01-31   MRN:  [id number]   Chief Complaint: Hospitalization Follow-up, Hypertension, and Immunizations (FLU SHOT) She received a TOC call but did not schedule follow-up within the 14 day time frame.  Acute congestive heart failure diastolic -- patient came in with increasing shortness of breath, elevated BNP of 400 with chest x-ray showing interstitial edema -- responding to IV Lasix-- diuresed well.~UOP 8 liters. --lasix 20 mg qod -- breathing improved -- PRN nebs -- patient sats 94% on room air. No respiratory distress.   Fall and rib pain with rib fracture right lateral 9-11 -- PRN pain meds -- physical therapy --HHPT   Bilateral peroneal vein DVT -- patient recently had followed sustained a fracture and was not able to get around the house -- will treat with PO anticoagulation at least for three months. Defer to PCP for follow-up -- discussed with pharmacist start PO eliquis -- hemoglobin stable -- discussed with patient's daughter [name] about eliquis and she is aware of the risk and benefit   Hypertension -- continue lisinopril, amlodipine --resumed HCTZ   Hyperlipidemia -- continue statins HPI  Lab Results  Component Value Date   NA 137 12/31/2021   K 3.7 12/31/2021   CO2 31 12/31/2021   GLUCOSE 105 (H) 12/31/2021   BUN 12 12/31/2021   CREATININE 0.90 12/31/2021   CALCIUM 8.9 12/31/2021   EGFR 40 (L) 10/17/2020   GFRNONAA >60 12/31/2021   Lab Results  Component Value Date   CHOL 162 10/17/2020   HDL 71 10/17/2020   LDLCALC 80 10/17/2020   TRIG 52 10/17/2020   CHOLHDL 2.3 10/17/2020   Lab Results  Component Value Date   TSH 2.000 10/17/2020   No results found for: "HGBA1C" Lab Results  Component Value Date   WBC 3.9 (L) 12/31/2021   HGB 11.5 (L) 12/31/2021   HCT 34.9 (L) 12/31/2021   MCV 100.0 12/31/2021   PLT 165 12/31/2021   Lab Results  Component Value Date   ALT 21 12/30/2021    AST 24 12/30/2021   ALKPHOS 69 12/30/2021   BILITOT 0.8 12/30/2021   Lab Results  Component Value Date   VD25OH 43.1 06/10/2017     Review of Systems  Constitutional:  Negative for chills, fatigue and fever.  HENT:  Negative for nosebleeds and trouble swallowing.   Respiratory:  Negative for chest tightness, shortness of breath and wheezing.   Cardiovascular:  Positive for leg swelling. Negative for chest pain and palpitations.  Gastrointestinal:  Positive for nausea. Negative for anal bleeding, constipation, diarrhea and vomiting.  Genitourinary:  Negative for hematuria.  Musculoskeletal:  Positive for arthralgias, back pain and gait problem.  Neurological:  Negative for dizziness and headaches.  Psychiatric/Behavioral:  Negative for dysphoric mood and sleep disturbance. The patient is not nervous/anxious.     Patient Active Problem List   Diagnosis Date Noted   Hypokalemia 12/31/2021   Acute lower UTI 12/31/2021   Fracture of rib of right side 12/31/2021   DVT, lower extremity, distal, acute, bilateral ([place]) 12/31/2021   Acute CHF (congestive heart failure) ([place]) 12/30/2021   Aortic atherosclerosis ([place]) 10/20/2020   Ganglion cyst of wrist, right 03/20/2019   Thrombocytopenia ([place]) 07/19/2018   Iron deficiency anemia    Polyp of sigmoid colon    Pancytopenia ([place]) 09/30/2017   GERD (gastroesophageal reflux disease) 06/10/2017   CKD (chronic kidney disease) stage 3, GFR 30-59 ml/min (  [place]) 06/08/2016   Hyperlipidemia 06/08/2016   Scoliosis 06/07/2016   Osteoporosis 06/07/2016   Essential hypertension 06/07/2016   CAD (coronary artery disease) 06/07/2016   Depression, major, single episode, moderate ([place]) 06/07/2016    No Known Allergies  Past Surgical History:  Procedure Laterality Date   ABDOMINAL HYSTERECTOMY     CATARACT EXTRACTION, BILATERAL  2021   COLONOSCOPY WITH PROPOFOL N/A 12/05/2017   Procedure: COLONOSCOPY WITH PROPOFOL;  Surgeon: Lucilla Lame, MD;   Location: [facility];  Service: Endoscopy;  Laterality: N/A;   ESOPHAGOGASTRODUODENOSCOPY (EGD) WITH PROPOFOL N/A 12/05/2017   Procedure: ESOPHAGOGASTRODUODENOSCOPY (EGD) WITH PROPOFOL;  Surgeon: Lucilla Lame, MD;  Location: [facility];  Service: Endoscopy;  Laterality: N/A;   POLYPECTOMY  12/05/2017   Procedure: POLYPECTOMY;  Surgeon: Lucilla Lame, MD;  Location[facility];  Service: Endoscopy;;   TONSILLECTOMY      Social History   Tobacco Use   Smoking status: Never   Smokeless tobacco: Never   Tobacco comments:    smoking cessation materials not required  Vaping Use   Vaping Use: Never used  Substance Use Topics   Alcohol use: No   Drug use: No     Medication list has been reviewed and updated.  Current Meds  Medication Sig   amLODipine (NORVASC) 10 MG tablet TAKE 1 TABLET(10 MG) BY MOUTH DAILY   apixaban (ELIQUIS) 5 MG TABS tablet Take 2 tablets (10 mg total) by mouth 2 (two) times daily. From 01/07/22--take 5 mg twice a day   aspirin EC 81 MG tablet Take 81 mg by mouth daily. Swallow whole.   furosemide (LASIX) 20 MG tablet Take 1 tablet (20 mg total) by mouth every other day for 90 doses.   lisinopril (ZESTRIL) 20 MG tablet Take 1 tablet (20 mg total) by mouth daily.   Multiple Vitamins-Calcium (ONE-A-DAY WOMENS PO) Take 1 tablet by mouth daily.   pantoprazole (PROTONIX) 40 MG tablet TAKE 1 TABLET(40 MG) BY MOUTH DAILY   sertraline (ZOLOFT) 50 MG tablet TAKE 1 TABLET(50 MG) BY MOUTH DAILY   traMADol (ULTRAM) 50 MG tablet Take 1 tablet (50 mg total) by mouth every 8 (eight) hours as needed for severe pain.   [DISCONTINUED] atorvastatin (LIPITOR) 40 MG tablet TAKE 1 TABLET(40 MG) BY MOUTH DAILY   [DISCONTINUED] lisinopril-hydrochlorothiazide (ZESTORETIC) 20-12.5 MG tablet TAKE 2 TABLETS BY MOUTH DAILY       01/19/2022    2:18 PM 01/24/2021    2:24 PM 10/17/2020    8:11 AM 07/28/2020    3:08 PM  GAD 7 : Generalized Anxiety Score  Nervous,  Anxious, on Edge 0 1 0 0  Control/stop worrying 0 2 0 0  Worry too much - different things 0 2 0 0  Trouble relaxing 0 0 0 0  Restless 0 0 0 0  Easily annoyed or irritable 0 1 0 0  Afraid - awful might happen 0 0 0 0  Total GAD 7 Score 0 6 0 0  Anxiety Difficulty Not difficult at all Not difficult at all         01/19/2022    2:18 PM 01/24/2021    2:19 PM 10/17/2020    8:10 AM  Depression screen PHQ 2/9  Decreased Interest 1 1 0  Down, Depressed, Hopeless 1 0 0  PHQ - 2 Score 2 1 0  Altered sleeping 0 0 0  Tired, decreased energy 0 0 0  Change in appetite 0 0 0  Feeling bad  or failure about yourself  0 1 0  Trouble concentrating 0 0 0  Moving slowly or fidgety/restless 0 0 0  Suicidal thoughts 0 0 0  PHQ-9 Score 2 2 0  Difficult doing work/chores Not difficult at all Not difficult at all Not difficult at all    BP Readings from Last 3 Encounters:  01/19/22 (!) 142/96  01/02/22 (!) 111/57  10/17/20 134/76    Physical Exam Constitutional:      General: She is not in acute distress. Neck:     Vascular: No carotid bruit.  Cardiovascular:     Rate and Rhythm: Normal rate and regular rhythm.  Pulmonary:     Effort: Pulmonary effort is normal.     Breath sounds: Normal breath sounds. No wheezing.  Musculoskeletal:        General: No swelling or tenderness.     Cervical back: Normal range of motion.     Right lower leg: No edema.     Left lower leg: No edema.  Lymphadenopathy:     Cervical: No cervical adenopathy.  Skin:    General: Skin is warm.  Neurological:     General: No focal deficit present.     Mental Status: She is alert.     Gait: Gait abnormal.     Wt Readings from Last 3 Encounters:  01/19/22 99 lb 12.8 oz (45.3 kg)  01/02/22 111 lb 11.2 oz (50.7 kg)  10/17/20 103 lb (46.7 kg)    BP (!) 142/96 (BP Location: Right Arm, Cuff Size: Small)   Pulse 79   Ht 5' (1.524 m)   Wt 99 lb 12.8 oz (45.3 kg)   SpO2 94%   BMI 19.49 kg/m   Assessment and  Plan: 1. Acute deep vein thrombosis (DVT) of both peroneal veins (HCC) Recommend Eliquis daily for 3 months the resume ASA daily  2. Essential hypertension BP not controlled due to medication non compliance. Continue Amlodipine and Furosemide qod Resume lisinopril without HCTZ Follow up in one month - lisinopril (ZESTRIL) 20 MG tablet; Take 1 tablet (20 mg total) by mouth daily.  Dispense: 90 tablet; Refill: 0 - Comprehensive metabolic panel - CBC with Differential/Platelet  3. Hyperlipidemia, unspecified hyperlipidemia type Resume statin - atorvastatin (LIPITOR) 40 MG tablet; Take 1 tablet (40 mg total) by mouth daily.  Dispense: 90 tablet; Refill: 0  4. Depression, major, single episode, moderate ([place]) Doing well currently Has been out of Sertraline for the past 3 months Will re-evaluate at next visit  5. Pancytopenia (HCC) - CBC with Differential/Platelet  6. Thrombocytopenia (HCC) - CBC with Differential/Platelet  7. Need for immunization against influenza - Flu Vaccine QUAD High Dose(Fluad)  8. Closed fracture of multiple ribs of right side, initial encounter Minimal pain currently. Continue Tramadol PRN    Partially dictated using Editor, commissioning. Any errors are unintentional.  Halina Maidens, MD [facility]  01/19/2022

## 2022-01-20 LAB — CBC WITH DIFFERENTIAL/PLATELET
Basophils Absolute: 0 10*3/uL (ref 0.0–0.2)
Basos: 0 %
EOS (ABSOLUTE): 0.1 10*3/uL (ref 0.0–0.4)
Eos: 3 %
Hematocrit: 37.5 % (ref 34.0–46.6)
Hemoglobin: 12.6 g/dL (ref 11.1–15.9)
Immature Grans (Abs): 0 10*3/uL (ref 0.0–0.1)
Immature Granulocytes: 0 %
Lymphocytes Absolute: 1.1 10*3/uL (ref 0.7–3.1)
Lymphs: 24 %
MCH: 32.6 pg (ref 26.6–33.0)
MCHC: 33.6 g/dL (ref 31.5–35.7)
MCV: 97 fL (ref 79–97)
Monocytes Absolute: 0.4 10*3/uL (ref 0.1–0.9)
Monocytes: 8 %
Neutrophils Absolute: 2.9 10*3/uL (ref 1.4–7.0)
Neutrophils: 65 %
Platelets: 169 10*3/uL (ref 150–450)
RBC: 3.87 x10E6/uL (ref 3.77–5.28)
RDW: 12 % (ref 11.7–15.4)
WBC: 4.5 10*3/uL (ref 3.4–10.8)

## 2022-01-20 LAB — COMPREHENSIVE METABOLIC PANEL
ALT: 11 IU/L (ref 0–32)
AST: 16 IU/L (ref 0–40)
Albumin/Globulin Ratio: 1.8 (ref 1.2–2.2)
Albumin: 4.4 g/dL (ref 3.7–4.7)
Alkaline Phosphatase: 102 IU/L (ref 44–121)
BUN/Creatinine Ratio: 18 (ref 12–28)
BUN: 20 mg/dL (ref 8–27)
Bilirubin Total: 0.4 mg/dL (ref 0.0–1.2)
CO2: 27 mmol/L (ref 20–29)
Calcium: 9.6 mg/dL (ref 8.7–10.3)
Chloride: 94 mmol/L — ABNORMAL LOW (ref 96–106)
Creatinine, Ser: 1.12 mg/dL — ABNORMAL HIGH (ref 0.57–1.00)
Globulin, Total: 2.5 g/dL (ref 1.5–4.5)
Glucose: 95 mg/dL (ref 70–99)
Potassium: 4.2 mmol/L (ref 3.5–5.2)
Sodium: 138 mmol/L (ref 134–144)
Total Protein: 6.9 g/dL (ref 6.0–8.5)
eGFR: 48 mL/min/{1.73_m2} — ABNORMAL LOW (ref >59)

## 2022-01-21 ENCOUNTER — Telehealth: Payer: Self-pay | Admitting: Family

## 2022-01-21 ENCOUNTER — Ambulatory Visit: Payer: Medicare Other | Admitting: Family

## 2022-01-21 NOTE — Telephone Encounter (Signed)
Patient did not show for her initial [facility] Failure [facility] appointment on 01/21/22. Will attempt to reschedule.

## 2022-01-21 NOTE — Progress Notes (Deleted)
   Patient ID: Vanessa Farmer, female    DOB: [birth year], 86 y.o.   MRN: [id number]  HPI  Vanessa Farmer is a 86 y/o female with a history of  Echo report from 12/31/21 reviewed and showed an EF of 55-60% along with moderate LVH/ LAE and mild MR.   Admitted 12/30/21 due to acute onset worsening lower extremity edema with associated dyspnea, orthopnea paroxysmal nocturnal dyspnea. Chest CTA negative for PE. Venous Doppler of both lower extremities showed nonocclusive thrombus within both peroneal veins in the calf and soft tissue edema. Initially given IV lasix for her HF symptoms and transitioned to oral diuretics. Discharged after 3 days.   She presents today for her initial visit with a chief complaint of   Review of Systems    Physical Exam    Assessment & Plan:  1: Chronic heart failure with preserved ejection fraction along with structural changes (LVH/ LAE)- - NYHA class - BNP 12/30/21 was 414.6  2: HTN- - BP - saw PCP Vanessa Farmer) 01/19/22 - BMP 01/19/22 reviewed and showed sodium 138, potassium 4.2, creatinine 1.12 & GFR 48  3: DVT-  4: Depression-

## 2022-01-29 ENCOUNTER — Ambulatory Visit (INDEPENDENT_AMBULATORY_CARE_PROVIDER_SITE_OTHER): Payer: Medicare Other

## 2022-01-29 DIAGNOSIS — Z0000 Encounter for general adult medical examination without abnormal findings: Secondary | ICD-10-CM

## 2022-01-29 NOTE — Progress Notes (Signed)
I connected with  Vanessa Farmer on 01/29/22 by a audio enabled telemedicine application and verified that I am speaking with the correct person using two identifiers.  Patient Location: Home  Provider Location: Office/Clinic  I discussed the limitations of evaluation and management by telemedicine. The patient expressed understanding and agreed to proceed.   Subjective:   Vanessa Farmer is a 86 y.o. female who presents for Medicare Annual (Subsequent) preventive examination.  Review of Systems    Per HPI unless specifically indicated below.  Cardiac Risk Factors include: advanced age (>18mn, >>51women);female gender          Objective:    Today's Vitals   01/29/22 1501  PainSc: 0-No pain   There is no height or weight on file to calculate BMI.     12/31/2021    2:59 PM 12/30/2021    6:14 PM 08/20/2019    9:43 AM 03/28/2019   11:29 AM 09/27/2018    8:39 AM 07/19/2018    9:12 AM 04/20/2018   10:51 AM  Advanced Directives  Does Patient Have a Medical Advance Directive?  No No No No No Yes  Would patient like information on creating a medical advance directive? No - Patient declined  No - Patient declined No - Patient declined  Yes (MAU/Ambulatory/Procedural Areas - Information given)     Current Medications (verified) Outpatient Encounter Medications as of 01/29/2022  Medication Sig   amLODipine (NORVASC) 10 MG tablet TAKE 1 TABLET(10 MG) BY MOUTH DAILY   apixaban (ELIQUIS) 5 MG TABS tablet Take 2 tablets (10 mg total) by mouth 2 (two) times daily. From 01/07/22--take 5 mg twice a day   atorvastatin (LIPITOR) 40 MG tablet Take 1 tablet (40 mg total) by mouth daily.   furosemide (LASIX) 20 MG tablet Take 1 tablet (20 mg total) by mouth every other day for 90 doses.   lisinopril (ZESTRIL) 20 MG tablet Take 1 tablet (20 mg total) by mouth daily.   Multiple Vitamins-Calcium (ONE-A-DAY WOMENS PO) Take 1 tablet by mouth daily.   pantoprazole (PROTONIX) 40 MG tablet TAKE 1  TABLET(40 MG) BY MOUTH DAILY   sertraline (ZOLOFT) 50 MG tablet TAKE 1 TABLET(50 MG) BY MOUTH DAILY   traMADol (ULTRAM) 50 MG tablet Take 1 tablet (50 mg total) by mouth every 8 (eight) hours as needed for severe pain.   ENSURE (ENSURE) Take 237 mLs by mouth 2 (two) times daily between meals.   [DISCONTINUED] aspirin EC 81 MG tablet Take 81 mg by mouth daily. Swallow whole. (Patient not taking: Reported on 01/29/2022)   No facility-administered encounter medications on file as of 01/29/2022.    Allergies (verified) Patient has no known allergies.   History: Past Medical History:  Diagnosis Date   Anemia    Cataract    Depression    Fall    GERD (gastroesophageal reflux disease)    Hyperlipidemia    Hypertension    Myocardial infarction ([facility]    "mild" many yrs ago   Past Surgical History:  Procedure Laterality Date   ABDOMINAL HYSTERECTOMY     CATARACT EXTRACTION, BILATERAL  2021   COLONOSCOPY WITH PROPOFOL N/A 12/05/2017   Procedure: COLONOSCOPY WITH PROPOFOL;  Surgeon: WLucilla Lame MD;  [place]  Service: Endoscopy;  Laterality: N/A;   ESOPHAGOGASTRODUODENOSCOPY (EGD) WITH PROPOFOL N/A 12/05/2017   Procedure: ESOPHAGOGASTRODUODENOSCOPY (EGD) WITH PROPOFOL;  Surgeon: WLucilla Lame MD;  [place]  Service: Endoscopy;  Laterality: N/A;  POLYPECTOMY  12/05/2017   Procedure: POLYPECTOMY;  Surgeon: Lucilla Lame, MD;  Location: [place];  Service: Endoscopy;;   TONSILLECTOMY     Family History  Problem Relation Age of Onset   Heart disease Mother    Brain cancer Mother    Heart attack Father    Liver disease Daughter    Throat cancer Brother    Cancer Maternal Grandfather        Unsure type   Social History   Socioeconomic History   Marital status: Widowed    Spouse name: Not on file   Number of children: 4   Years of education: 9 th grade   Highest education level: 9th grade  Occupational History   Occupation:  Retired  Tobacco Use   Smoking status: Never   Smokeless tobacco: Never   Tobacco comments:    smoking cessation materials not required  Vaping Use   Vaping Use: Never used  Substance and Sexual Activity   Alcohol use: No   Drug use: No   Sexual activity: Not Currently  Other Topics Concern   Not on file  Social History Narrative   Pt lives with her son   Social Determinants of Health   Financial Resource Strain: Low Risk  (01/29/2022)   Overall Financial Resource Strain (CARDIA)    Difficulty of Paying Living Expenses: Not hard at all  Food Insecurity: No Food Insecurity (01/29/2022)   Hunger Vital Sign    Worried About Running Out of Food in the Last Year: Never true    [place] in the Last Year: Never true  Transportation Needs: No Transportation Needs (01/29/2022)   PRAPARE - Hydrologist (Medical): No    Lack of Transportation (Non-Medical): No  Physical Activity: Inactive (01/29/2022)   Exercise Vital Sign    Days of Exercise per Week: 0 days    Minutes of Exercise per Session: 0 min  Stress: No Stress Concern Present (01/29/2022)   [place]    Feeling of Stress : Not at all  Social Connections: Moderately Integrated (01/29/2022)   Social Connection and Isolation Panel [NHANES]    Frequency of Communication with Friends and Family: More than three times a week    Frequency of Social Gatherings with Friends and Family: More than three times a week    Attends Religious Services: 1 to 4 times per year    Active Member of [employer] or Organizations: Yes    Attends Archivist Meetings: Never    Marital Status: Widowed    Tobacco Counseling Counseling given: No Tobacco comments: smoking cessation materials not required Pt does not smoke and no history of smoking.  Clinical Intake:  Pre-visit preparation completed: No  Pain : No/denies pain Pain Score: 0-No  pain     Nutritional Status: BMI <19  Underweight Nutritional Risks: Other (Comment) Diabetes: No  How often do you need to have someone help you when you read instructions, pamphlets, or other written materials from your doctor or pharmacy?: 1 - Never  Diabetic? No Information entered by :: Donnie Mesa, CMA   Activities of Daily Living    01/29/2022    2:59 PM 12/31/2021    2:35 PM  In your present state of health, do you have any difficulty performing the following activities:  Hearing? 0 0  Vision? 1 0  Difficulty concentrating or making decisions? 0 0  Walking or  climbing stairs? 0 1  Dressing or bathing? 0 0  Doing errands, shopping? 0 1    Patient Care Team: Glean Hess, MD as PCP - General (Internal Medicine) Lucilla Lame, MD as Consulting Physician (Gastroenterology) Vanita Ingles, RN as Case Manager (General Practice)  Indicate any recent Medical Services you may have received from other than [facility] providers in the past year (date may be approximate).    The pt was hospitalized 12/30/21-01/02/22 for Acute Congestive Heart Failure.  Assessment:   This is a routine wellness examination for Vanessa Farmer.  Hearing/Vision screen Denies any hearing issues. Pt reports annual eye exam [facility]. No visual concerns, wear glasses.  Dietary issues and exercise activities discussed: Current Exercise Habits: The patient does not participate in regular exercise at present, Exercise limited by: orthopedic condition(s)   Goals Addressed   None    Depression Screen    01/29/2022    2:58 PM 01/19/2022    2:18 PM 01/24/2021    2:19 PM 10/17/2020    8:10 AM 07/28/2020    3:08 PM 03/21/2020    3:40 PM 09/26/2019   10:10 AM  PHQ 2/9 Scores  PHQ - 2 Score 0 2 1 0 0 0 2  PHQ- 9 Score 0 2 2 0 0 0 2    Fall Risk    01/29/2022    2:59 PM 01/19/2022    2:18 PM 01/24/2021    2:08 PM 10/17/2020    8:14 AM 07/28/2020    3:08 PM  Fall Risk   Falls in the past year? 0 '1 1  1 '$ 0  Number falls in past yr: 0 '1 1 1   '$ Injury with Fall? 0 1 1 0   Risk for fall due to : No Fall Risks History of fall(s) History of fall(s);Other (Comment)    Risk for fall due to: Comment   Per pt due to Dizziness    Follow up Falls evaluation completed Falls evaluation completed Falls evaluation completed;Follow up appointment Falls evaluation completed Falls evaluation completed  Comment   Informed patient to call office to inform them about her falls and schedule an appt      [place]:  Any stairs in or around the home? No  If so, are there any without handrails?  No stairs in the home Home free of loose throw rugs in walkways, pet beds, electrical cords, etc? Yes  Adequate lighting in your home to reduce risk of falls? Yes   ASSISTIVE DEVICES UTILIZED TO PREVENT FALLS:  Life alert? Yes  Use of a cane, walker or w/c? Yes  Grab bars in the bathroom? No  Shower chair or bench in shower? No  Elevated toilet seat or a handicapped toilet? No   TIMED UP AND GO:  Was the test performed?  not performed, virtual appt .  Cognitive Function:        01/29/2022    3:03 PM 01/24/2021    2:14 PM 08/20/2019    9:47 AM 07/19/2018    9:17 AM 06/01/2017    3:13 PM  6CIT Screen  What Year? 0 points 4 points 0 points 0 points 0 points  What month? 3 points 3 points 0 points 0 points 3 points  What time? 0 points 0 points 0 points 0 points 0 points  Count back from 20 0 points 0 points 0 points 0 points 0 points  Months in reverse 4 points 4 points  4 points 4 points 4 points  Repeat phrase 10 points  10 points 10 points 0 points  Total Score 17 points  14 points 14 points 7 points    Immunizations Immunization History  Administered Date(s) Administered   Fluad Quad(high Dose 65+) 03/20/2019, 03/21/2020, 01/19/2022   Influenza, High Dose Seasonal PF 01/31/2017   Pneumococcal Conjugate-13 06/07/2016   Pneumococcal Polysaccharide-23 01/17/2018   Tdap  06/07/2016    TDAP status: Up to date  Flu Vaccine status: Up to date  Pneumococcal vaccine status: Up to date  Covid-19 vaccine status: Declined, Education has been provided regarding the importance of this vaccine but patient still declined. Advised may receive this vaccine at local pharmacy or Health Dept.or vaccine clinic. Aware to provide a copy of the vaccination record if obtained from local pharmacy or Health Dept. Verbalized acceptance and understanding.  Qualifies for Shingles Vaccine? Yes   Zostavax completed No   Shingrix Completed?: No.    Education has been provided regarding the importance of this vaccine. Patient has been advised to call insurance company to determine out of pocket expense if they have not yet received this vaccine. Advised may also receive vaccine at local pharmacy or Health Dept. Verbalized acceptance and understanding.  Screening Tests Health Maintenance  Topic Date Due   Zoster Vaccines- Shingrix (1 of 2) Never done   TETANUS/TDAP  06/07/2026   Pneumonia Vaccine 61+ Years old  Completed   INFLUENZA VACCINE  Completed   DEXA SCAN  Completed   HPV VACCINES  Aged Out   COVID-19 Vaccine  Discontinued    Health Maintenance  Health Maintenance Due  Topic Date Due   Zoster Vaccines- Shingrix (1 of 2) Never done    Colorectal cancer screening: No longer required.   Mammogram status: No longer required due to age.  DEXA Scan: completes 05/10/2012  Lung Cancer Screening: (Low Dose CT Chest recommended if Age 40-80 years, 30 pack-year currently smoking OR have quit w/in 15years.) does not qualify.   Additional Screening:  Hepatitis C Screening: does not qualify  Vision Screening: Recommended annual ophthalmology exams for early detection of glaucoma and other disorders of the eye. Is the patient up to date with their annual eye exam?  Yes  Who is the provider or what is the name of the office in which the patient attends annual eye exams?   [facility] If pt is not established with a provider, would they like to be referred to a provider to establish care? No .   Dental Screening: Recommended annual dental exams for proper oral hygiene  Community Resource Referral / Chronic Care Management: CRR required this visit?  No   CCM required this visit?  No      Plan:     I have personally reviewed and noted the following in the patient's chart:   Medical and social history Use of alcohol, tobacco or illicit drugs  Current medications and supplements including opioid prescriptions. Patient is not currently taking opioid prescriptions. Functional ability and status Nutritional status Physical activity Advanced directives List of other physicians Hospitalizations, surgeries, and ER visits in previous 12 months Vitals Screenings to include cognitive, depression, and falls Referrals and appointments  In addition, I have reviewed and discussed with patient certain preventive protocols, quality metrics, and best practice recommendations. A written personalized care plan for preventive services as well as general preventive health recommendations were provided to patient.    Vanessa Farmer , Thank you for taking time  to come for your Medicare Wellness Visit. I appreciate your ongoing commitment to your health goals. Please review the following plan we discussed and let me know if I can assist you in the future.   These are the goals we discussed:  Goals      DIET - INCREASE WATER INTAKE     Recommend to drink at least 6-8 8oz glasses of water per day.     RNCM: Effective Management of CHF     Care Coordination Interventions: Basic overview and discussion of pathophysiology of Heart Failure reviewed Provided education on low sodium diet. Review and the patients daughter is monitoring her dietary intake  Reviewed Heart Failure Action Plan in depth and provided written copy. The patients daughter has talked with the  specialist and has parameters on when to call for weight changes Assessed need for readable accurate scales in home. Can safely weigh Provided education about placing scale on hard, flat surface Advised patient to weigh each morning after emptying bladder Discussed importance of daily weight and advised patient to weigh and record daily. The patients daughter knows to call for +3 pounds in one day and/or +5 pounds in a week. The patients daughter is monitoring daily weights Reviewed role of diuretics in prevention of fluid overload and management of heart failure. Review and education provided; Discussed the importance of keeping all appointments with provider. 01-19-2022 at 2 pm with cardiology. 01-21-2022 with the pcp. Review of discussing the need for PT/OT home health. Per Admission/Discharge these were supposed to be ordered but were not ordered.  Provided patient with education about the role of exercise in the management of heart failure Advised patient to discuss home health PT/OT, new questions and concerns with provider Screening for signs and symptoms of depression related to chronic disease state  Assessed social determinant of health barriers  01-14-2022: The patients daughter and patient agree to participate in the care coordination program. Information given on how to reach the [facility] Review of safety and monitoring for changes in mobility. Patient has had falls recently with rib fractures. Education and support given. Link sent to the daughter today to be able to set up MyChart so the patient and daughter can view notes from the hospital, labs, pcp, and specialist notes.   Active listening / Reflection utilized  Emotional Support Provided         This is a list of the screening recommended for you and due dates:  Health Maintenance  Topic Date Due   Zoster (Shingles) Vaccine (1 of 2) Never done   Tetanus Vaccine  06/07/2026   Pneumonia Vaccine  Completed   Flu Shot  Completed    DEXA scan (bone density measurement)  Completed   HPV Vaccine  Aged Out   COVID-19 Vaccine  Discontinued      Wilson Singer, [place]   01/29/2022   Nurse Notes: Approximately 40 minute Non-Face-to-face visit

## 2022-02-01 ENCOUNTER — Ambulatory Visit: Payer: Medicare Other | Admitting: Family

## 2022-02-02 ENCOUNTER — Ambulatory Visit: Payer: Medicare Other | Admitting: Family

## 2022-02-02 ENCOUNTER — Telehealth: Payer: Self-pay | Admitting: Family

## 2022-02-02 NOTE — Telephone Encounter (Signed)
Patient did not show for her Heart Failure Clinic appointment on 02/02/22. Will attempt to reschedule.

## 2022-02-22 ENCOUNTER — Ambulatory Visit (INDEPENDENT_AMBULATORY_CARE_PROVIDER_SITE_OTHER): Payer: Medicare Other | Admitting: Internal Medicine

## 2022-02-22 ENCOUNTER — Encounter: Payer: Self-pay | Admitting: Internal Medicine

## 2022-02-22 VITALS — BP 114/60 | HR 91 | Ht 60.0 in | Wt 105.0 lb

## 2022-02-22 DIAGNOSIS — F321 Major depressive disorder, single episode, moderate: Secondary | ICD-10-CM | POA: Diagnosis not present

## 2022-02-22 DIAGNOSIS — I824Z3 Acute embolism and thrombosis of unspecified deep veins of distal lower extremity, bilateral: Secondary | ICD-10-CM

## 2022-02-22 DIAGNOSIS — I10 Essential (primary) hypertension: Secondary | ICD-10-CM | POA: Diagnosis not present

## 2022-02-22 NOTE — Progress Notes (Signed)
Date:  02/22/2022   Name:  Vanessa Farmer   DOB:  18-Oct-1935   MRN:  [id number]   Chief Complaint: Depression and Hypertension  Depression        The problem has been resolved since onset.  Associated symptoms include no appetite change and no headaches.  Past treatments include SSRIs - Selective serotonin reuptake inhibitors (no longer taking sertraline). Hypertension This is a chronic problem. The problem has been gradually improving since onset. The problem is controlled. Pertinent negatives include no chest pain, headaches, palpitations or shortness of breath. Past treatments include calcium channel blockers, diuretics and ACE inhibitors. The current treatment provides significant improvement. There are no compliance problems.   DVT - on Eliquis for 3 mnths.  No bleeding issues noted.  Will finish her course at the end of November.  Leg swelling is much improved.  Lab Results  Component Value Date   NA 138 01/19/2022   K 4.2 01/19/2022   CO2 27 01/19/2022   GLUCOSE 95 01/19/2022   BUN 20 01/19/2022   CREATININE 1.12 (H) 01/19/2022   CALCIUM 9.6 01/19/2022   EGFR 48 (L) 01/19/2022   GFRNONAA >60 12/31/2021   Lab Results  Component Value Date   CHOL 162 10/17/2020   HDL 71 10/17/2020   LDLCALC 80 10/17/2020   TRIG 52 10/17/2020   CHOLHDL 2.3 10/17/2020   Lab Results  Component Value Date   TSH 2.000 10/17/2020   No results found for: "HGBA1C" Lab Results  Component Value Date   WBC 4.5 01/19/2022   HGB 12.6 01/19/2022   HCT 37.5 01/19/2022   MCV 97 01/19/2022   PLT 169 01/19/2022   Lab Results  Component Value Date   ALT 11 01/19/2022   AST 16 01/19/2022   ALKPHOS 102 01/19/2022   BILITOT 0.4 01/19/2022   Lab Results  Component Value Date   VD25OH 43.1 06/10/2017     Review of Systems  Constitutional:  Positive for unexpected weight change (has gained 6 lbs - eating better). Negative for appetite change, diaphoresis and fever.  Respiratory:  Negative  for chest tightness, shortness of breath and wheezing.   Cardiovascular:  Positive for leg swelling. Negative for chest pain and palpitations.  Neurological:  Negative for dizziness, light-headedness and headaches.  Hematological:  Does not bruise/bleed easily.  Psychiatric/Behavioral:  Positive for depression. Negative for dysphoric mood and sleep disturbance. The patient is not nervous/anxious.     Patient Active Problem List   Diagnosis Date Noted   Hypokalemia 12/31/2021   Acute lower UTI 12/31/2021   Fracture of rib of right side 12/31/2021   DVT, lower extremity, distal, acute, bilateral ([place]) 12/31/2021   Acute CHF (congestive heart failure) ([place]) 12/30/2021   Aortic atherosclerosis ([place]) 10/20/2020   Ganglion cyst of wrist, right 03/20/2019   Thrombocytopenia ([place]) 07/19/2018   Iron deficiency anemia    Polyp of sigmoid colon    Pancytopenia ([place]) 09/30/2017   GERD (gastroesophageal reflux disease) 06/10/2017   CKD (chronic kidney disease) stage 3, GFR 30-59 ml/min ([place]) 06/08/2016   Hyperlipidemia 06/08/2016   Scoliosis 06/07/2016   Osteoporosis 06/07/2016   Essential hypertension 06/07/2016   CAD (coronary artery disease) 06/07/2016   Depression, major, single episode, moderate ([place]) 06/07/2016    No Known Allergies  Past Surgical History:  Procedure Laterality Date   ABDOMINAL HYSTERECTOMY     CATARACT EXTRACTION, BILATERAL  2021   COLONOSCOPY WITH PROPOFOL N/A 12/05/2017   Procedure: COLONOSCOPY WITH PROPOFOL;  Surgeon: Lucilla Lame, MD;  Location: [place];  Service: Endoscopy;  Laterality: N/A;   ESOPHAGOGASTRODUODENOSCOPY (EGD) WITH PROPOFOL N/A 12/05/2017   Procedure: ESOPHAGOGASTRODUODENOSCOPY (EGD) WITH PROPOFOL;  Surgeon: Lucilla Lame, MD;  Location: [place];  Service: Endoscopy;  Laterality: N/A;   POLYPECTOMY  12/05/2017   Procedure: POLYPECTOMY;  Surgeon: Lucilla Lame, MD;  Location: [facility];  Service: Endoscopy;;    TONSILLECTOMY      Social History   Tobacco Use   Smoking status: Never   Smokeless tobacco: Never   Tobacco comments:    smoking cessation materials not required  Vaping Use   Vaping Use: Never used  Substance Use Topics   Alcohol use: No   Drug use: No     Medication list has been reviewed and updated.  Current Meds  Medication Sig   amLODipine (NORVASC) 10 MG tablet TAKE 1 TABLET(10 MG) BY MOUTH DAILY   apixaban (ELIQUIS) 5 MG TABS tablet Take 2 tablets (10 mg total) by mouth 2 (two) times daily. From 01/07/22--take 5 mg twice a day   atorvastatin (LIPITOR) 40 MG tablet Take 1 tablet (40 mg total) by mouth daily.   furosemide (LASIX) 20 MG tablet Take 1 tablet (20 mg total) by mouth every other day for 90 doses.   lisinopril (ZESTRIL) 20 MG tablet Take 1 tablet (20 mg total) by mouth daily.   Multiple Vitamins-Calcium (ONE-A-DAY WOMENS PO) Take 1 tablet by mouth daily.   pantoprazole (PROTONIX) 40 MG tablet TAKE 1 TABLET(40 MG) BY MOUTH DAILY   sertraline (ZOLOFT) 50 MG tablet TAKE 1 TABLET(50 MG) BY MOUTH DAILY   traMADol (ULTRAM) 50 MG tablet Take 1 tablet (50 mg total) by mouth every 8 (eight) hours as needed for severe pain.       02/22/2022    3:16 PM 01/19/2022    2:18 PM 01/24/2021    2:24 PM 10/17/2020    8:11 AM  GAD 7 : Generalized Anxiety Score  Nervous, Anxious, on Edge 1 0 1 0  Control/stop worrying 0 0 2 0  Worry too much - different things 0 0 2 0  Trouble relaxing 0 0 0 0  Restless 0 0 0 0  Easily annoyed or irritable 0 0 1 0  Afraid - awful might happen 0 0 0 0  Total GAD 7 Score 1 0 6 0  Anxiety Difficulty Not difficult at all Not difficult at all Not difficult at all        02/22/2022    3:16 PM 01/29/2022    2:58 PM 01/19/2022    2:18 PM  Depression screen PHQ 2/9  Decreased Interest 0 0 1  Down, Depressed, Hopeless 0 0 1  PHQ - 2 Score 0 0 2  Altered sleeping 0 0 0  Tired, decreased energy 0 0 0  Change in appetite 0 0 0  Feeling bad or  failure about yourself  0 0 0  Trouble concentrating 0 0 0  Moving slowly or fidgety/restless 0 0 0  Suicidal thoughts 0 0 0  PHQ-9 Score 0 0 2  Difficult doing work/chores Not difficult at all Not difficult at all Not difficult at all    BP Readings from Last 3 Encounters:  02/22/22 114/60  01/19/22 (!) 142/96  01/02/22 (!) 111/57    Physical Exam Vitals and nursing note reviewed.  Constitutional:      General: She is not in acute distress.    Appearance: Normal appearance. She  is well-developed.  HENT:     Head: Normocephalic and atraumatic.  Cardiovascular:     Rate and Rhythm: Normal rate and regular rhythm.     Pulses: Normal pulses.  Pulmonary:     Effort: Pulmonary effort is normal. No respiratory distress.     Breath sounds: No wheezing or rhonchi.  Musculoskeletal:     Cervical back: Normal range of motion.  Lymphadenopathy:     Cervical: No cervical adenopathy.  Skin:    General: Skin is warm and dry.     Findings: No rash.  Neurological:     Mental Status: She is alert and oriented to person, place, and time.  Psychiatric:        Mood and Affect: Mood normal.        Behavior: Behavior normal.     Wt Readings from Last 3 Encounters:  02/22/22 105 lb (47.6 kg)  01/19/22 99 lb 12.8 oz (45.3 kg)  01/02/22 111 lb 11.2 oz (50.7 kg)    BP 114/60 (BP Location: Right Arm, Cuff Size: Normal)   Pulse 91   Ht 5' (1.524 m)   Wt 105 lb (47.6 kg)   SpO2 96%   BMI 20.51 kg/m   Assessment and Plan: 1. Essential hypertension Clinically stable exam with well controlled BP. Tolerating medications without side effects at this time. Pt to continue current regimen and low sodium diet; benefits of regular exercise as able discussed.  2. Depression, major, single episode, moderate ([place]) Symptoms essentially resolved Remain off of medications - can follow up as needed  3. DVT, lower extremity, distal, acute, bilateral ([place]) Recommend completing 3 months of Eliquis  then may discontinue   Partially dictated using Editor, commissioning. Any errors are unintentional.  Halina Maidens, MD [facility]  02/22/2022

## 2022-03-04 ENCOUNTER — Telehealth: Payer: Self-pay

## 2022-03-04 ENCOUNTER — Telehealth: Payer: Self-pay | Admitting: *Deleted

## 2022-03-04 NOTE — Chronic Care Management (AMB) (Signed)
  Care Coordination Note  03/04/2022 Name: Vanessa Farmer MRN: [id number] DOB: 03-26-1936  Vanessa Farmer is a 86 y.o. year old female who is a primary care patient of Glean Hess, MD and is actively engaged with the care management team. I reached out to [name] by phone today to assist with re-scheduling a follow up visit with the RN Case Manager  Follow up plan: Unsuccessful telephone outreach attempt made. A HIPAA compliant phone message was left for the patient providing contact information and requesting a return call.   Julian Hy, [place] Direct Dial: [phone]

## 2022-03-04 NOTE — Patient Outreach (Signed)
  Care Coordination   03/04/2022 Name: Vanessa Farmer MRN: [id number] DOB: [birth year]   Care Coordination Outreach Attempts:  An unsuccessful telephone outreach was attempted for a scheduled appointment today.  Follow Up Plan:  Additional outreach attempts will be made to offer the patient care coordination information and services.   Encounter Outcome:  No Answer  Care Coordination Interventions Activated:  No   Care Coordination Interventions:  No, not indicated    Noreene Larsson RN, MSN, [facility]  Mobile: [phone]

## 2022-03-04 NOTE — Telephone Encounter (Signed)
Daughter requesting to r/s 10-26 appt  Please assist further

## 2022-03-07 ENCOUNTER — Other Ambulatory Visit: Payer: Self-pay | Admitting: Internal Medicine

## 2022-03-07 DIAGNOSIS — K219 Gastro-esophageal reflux disease without esophagitis: Secondary | ICD-10-CM

## 2022-03-08 NOTE — Telephone Encounter (Signed)
Requested Prescriptions  Pending Prescriptions Disp Refills  . pantoprazole (PROTONIX) 40 MG tablet [Pharmacy Med Name: PANTOPRAZOLE '40MG'$  TABLETS] 90 tablet 3    Sig: TAKE 1 TABLET(40 MG) BY MOUTH DAILY     Gastroenterology: Proton Pump Inhibitors Passed - 03/07/2022  7:01 AM      Passed - Valid encounter within last 12 months    Recent Outpatient Visits          2 weeks ago Essential hypertension   [place] [facility] at [facility], Jesse Sans, MD   1 month ago Acute deep vein thrombosis (DVT) of both peroneal veins [facility])   [place] [facility] at [facility], Jesse Sans, MD   1 year ago Essential hypertension   [place] [facility] at [facility], Jesse Sans, MD   1 year ago Essential hypertension   [place] [facility] at [facility], Jesse Sans, MD   1 year ago Essential hypertension    Primary Care and Sports Medicine at [facility], Jesse Sans, MD      Future Appointments            In 2 months Cannady, Barbaraann Faster, NP [facility], [place]   In 5 months Army Melia, Jesse Sans, MD [facility] Medicine at [facility], [facility]

## 2022-03-24 NOTE — Progress Notes (Signed)
  Care Coordination Note  03/24/2022 Name: Vanessa Farmer MRN: [id number] DOB: 02/14/1936  Vanessa Farmer is a 86 y.o. year old female who is a primary care patient of Glean Hess, MD and is actively engaged with the care management team. I reached out to [name] by phone today to assist with re-scheduling a follow up visit with the RN Case Manager  Follow up plan: 2nd Unsuccessful telephone outreach attempt made. A HIPAA compliant phone message was left for the patient providing contact information and requesting a return call.   Julian Hy, [place] Direct Dial: [phone]

## 2022-04-03 ENCOUNTER — Other Ambulatory Visit: Payer: Self-pay | Admitting: Internal Medicine

## 2022-04-03 DIAGNOSIS — I10 Essential (primary) hypertension: Secondary | ICD-10-CM

## 2022-04-03 DIAGNOSIS — E785 Hyperlipidemia, unspecified: Secondary | ICD-10-CM

## 2022-04-04 ENCOUNTER — Inpatient Hospital Stay
Admission: EM | Admit: 2022-04-04 | Discharge: 2022-04-07 | DRG: 291 | Disposition: A | Discharge status: Home / Self Care | Payer: Medicare Other | Attending: Internal Medicine | Admitting: Internal Medicine

## 2022-04-04 ENCOUNTER — Emergency Department: Payer: Medicare Other

## 2022-04-04 ENCOUNTER — Other Ambulatory Visit: Payer: Self-pay

## 2022-04-04 DIAGNOSIS — G4489 Other headache syndrome: Secondary | ICD-10-CM | POA: Diagnosis not present

## 2022-04-04 DIAGNOSIS — J80 Acute respiratory distress syndrome: Secondary | ICD-10-CM | POA: Diagnosis not present

## 2022-04-04 DIAGNOSIS — I252 Old myocardial infarction: Secondary | ICD-10-CM | POA: Diagnosis not present

## 2022-04-04 DIAGNOSIS — N1831 Chronic kidney disease, stage 3a: Secondary | ICD-10-CM | POA: Diagnosis present

## 2022-04-04 DIAGNOSIS — I480 Paroxysmal atrial fibrillation: Secondary | ICD-10-CM | POA: Diagnosis present

## 2022-04-04 DIAGNOSIS — D61818 Other pancytopenia: Secondary | ICD-10-CM | POA: Diagnosis not present

## 2022-04-04 DIAGNOSIS — I2510 Atherosclerotic heart disease of native coronary artery without angina pectoris: Secondary | ICD-10-CM | POA: Diagnosis present

## 2022-04-04 DIAGNOSIS — I5030 Unspecified diastolic (congestive) heart failure: Secondary | ICD-10-CM | POA: Insufficient documentation

## 2022-04-04 DIAGNOSIS — Z7901 Long term (current) use of anticoagulants: Secondary | ICD-10-CM

## 2022-04-04 DIAGNOSIS — I10 Essential (primary) hypertension: Secondary | ICD-10-CM | POA: Diagnosis present

## 2022-04-04 DIAGNOSIS — Z8249 Family history of ischemic heart disease and other diseases of the circulatory system: Secondary | ICD-10-CM | POA: Diagnosis not present

## 2022-04-04 DIAGNOSIS — I130 Hypertensive heart and chronic kidney disease with heart failure and stage 1 through stage 4 chronic kidney disease, or unspecified chronic kidney disease: Secondary | ICD-10-CM | POA: Diagnosis present

## 2022-04-04 DIAGNOSIS — Z86718 Personal history of other venous thrombosis and embolism: Secondary | ICD-10-CM | POA: Diagnosis not present

## 2022-04-04 DIAGNOSIS — F32A Depression, unspecified: Secondary | ICD-10-CM | POA: Diagnosis present

## 2022-04-04 DIAGNOSIS — R002 Palpitations: Secondary | ICD-10-CM | POA: Diagnosis present

## 2022-04-04 DIAGNOSIS — R5381 Other malaise: Secondary | ICD-10-CM | POA: Diagnosis present

## 2022-04-04 DIAGNOSIS — I5033 Acute on chronic diastolic (congestive) heart failure: Secondary | ICD-10-CM | POA: Diagnosis not present

## 2022-04-04 DIAGNOSIS — K219 Gastro-esophageal reflux disease without esophagitis: Secondary | ICD-10-CM | POA: Diagnosis present

## 2022-04-04 DIAGNOSIS — R0602 Shortness of breath: Secondary | ICD-10-CM | POA: Diagnosis not present

## 2022-04-04 DIAGNOSIS — I4891 Unspecified atrial fibrillation: Secondary | ICD-10-CM | POA: Diagnosis not present

## 2022-04-04 DIAGNOSIS — K449 Diaphragmatic hernia without obstruction or gangrene: Secondary | ICD-10-CM | POA: Diagnosis not present

## 2022-04-04 DIAGNOSIS — Z79899 Other long term (current) drug therapy: Secondary | ICD-10-CM | POA: Diagnosis not present

## 2022-04-04 DIAGNOSIS — I499 Cardiac arrhythmia, unspecified: Secondary | ICD-10-CM | POA: Diagnosis not present

## 2022-04-04 DIAGNOSIS — J811 Chronic pulmonary edema: Secondary | ICD-10-CM | POA: Diagnosis not present

## 2022-04-04 DIAGNOSIS — J810 Acute pulmonary edema: Secondary | ICD-10-CM | POA: Diagnosis not present

## 2022-04-04 DIAGNOSIS — I509 Heart failure, unspecified: Secondary | ICD-10-CM

## 2022-04-04 DIAGNOSIS — Z91148 Patient's other noncompliance with medication regimen for other reason: Secondary | ICD-10-CM

## 2022-04-04 DIAGNOSIS — I5031 Acute diastolic (congestive) heart failure: Secondary | ICD-10-CM

## 2022-04-04 DIAGNOSIS — N183 Chronic kidney disease, stage 3 unspecified: Secondary | ICD-10-CM | POA: Diagnosis present

## 2022-04-04 DIAGNOSIS — E785 Hyperlipidemia, unspecified: Secondary | ICD-10-CM | POA: Diagnosis present

## 2022-04-04 DIAGNOSIS — Z1152 Encounter for screening for COVID-19: Secondary | ICD-10-CM | POA: Diagnosis not present

## 2022-04-04 DIAGNOSIS — R0902 Hypoxemia: Secondary | ICD-10-CM | POA: Diagnosis not present

## 2022-04-04 DIAGNOSIS — Z808 Family history of malignant neoplasm of other organs or systems: Secondary | ICD-10-CM

## 2022-04-04 DIAGNOSIS — F321 Major depressive disorder, single episode, moderate: Secondary | ICD-10-CM

## 2022-04-04 LAB — CBC
HCT: 34.4 % — ABNORMAL LOW (ref 36.0–46.0)
Hemoglobin: 11.4 g/dL — ABNORMAL LOW (ref 12.0–15.0)
MCH: 32.5 pg (ref 26.0–34.0)
MCHC: 33.1 g/dL (ref 30.0–36.0)
MCV: 98 fL (ref 80.0–100.0)
Platelets: 144 10*3/uL — ABNORMAL LOW (ref 150–400)
RBC: 3.51 MIL/uL — ABNORMAL LOW (ref 3.87–5.11)
RDW: 14.1 % (ref 11.5–15.5)
WBC: 3.3 10*3/uL — ABNORMAL LOW (ref 4.0–10.5)
nRBC: 0 % (ref 0.0–0.2)

## 2022-04-04 LAB — BASIC METABOLIC PANEL
Anion gap: 8 (ref 5–15)
BUN: 11 mg/dL (ref 8–23)
CO2: 26 mmol/L (ref 22–32)
Calcium: 8.5 mg/dL — ABNORMAL LOW (ref 8.9–10.3)
Chloride: 104 mmol/L (ref 98–111)
Creatinine, Ser: 0.99 mg/dL (ref 0.44–1.00)
GFR, Estimated: 56 mL/min — ABNORMAL LOW (ref >60)
Glucose, Bld: 132 mg/dL — ABNORMAL HIGH (ref 70–99)
Potassium: 3.5 mmol/L (ref 3.5–5.1)
Sodium: 138 mmol/L (ref 135–145)

## 2022-04-04 LAB — RESP PANEL BY RT-PCR (FLU A&B, COVID) ARPGX2
Influenza A by PCR: NEGATIVE
Influenza B by PCR: NEGATIVE
SARS Coronavirus 2 by RT PCR: NEGATIVE

## 2022-04-04 LAB — TROPONIN I (HIGH SENSITIVITY)
Troponin I (High Sensitivity): 16 ng/L (ref <18)
Troponin I (High Sensitivity): 21 ng/L — ABNORMAL HIGH (ref <18)

## 2022-04-04 LAB — TSH: TSH: 1.377 u[IU]/mL (ref 0.350–4.500)

## 2022-04-04 LAB — BRAIN NATRIURETIC PEPTIDE: B Natriuretic Peptide: 953.7 pg/mL — ABNORMAL HIGH (ref 0.0–100.0)

## 2022-04-04 LAB — MAGNESIUM: Magnesium: 2.1 mg/dL (ref 1.7–2.4)

## 2022-04-04 MED ORDER — FUROSEMIDE 10 MG/ML IJ SOLN
40.0000 mg | Freq: Once | INTRAMUSCULAR | Status: DC
  Filled 2022-04-04: qty 4

## 2022-04-04 MED ORDER — SODIUM CHLORIDE 0.9% FLUSH
3.0000 mL | Freq: Two times a day (BID) | INTRAVENOUS | Status: DC
  Administered 2022-04-04 – 2022-04-07 (×6): 3 mL via INTRAVENOUS

## 2022-04-04 MED ORDER — APIXABAN 5 MG PO TABS: 5.0000 mg | ORAL_TABLET | Freq: Two times a day (BID) | ORAL | 0 refills | Status: DC

## 2022-04-04 MED ORDER — LISINOPRIL 10 MG PO TABS: 20.0000 mg | ORAL_TABLET | Freq: Every day | ORAL | Status: DC

## 2022-04-04 MED ORDER — ATORVASTATIN CALCIUM 20 MG PO TABS
40.0000 mg | ORAL_TABLET | Freq: Every day | ORAL | Status: DC
  Administered 2022-04-04 – 2022-04-07 (×4): 40 mg via ORAL
  Filled 2022-04-04 (×4): qty 2

## 2022-04-04 MED ORDER — APIXABAN 2.5 MG PO TABS
2.5000 mg | ORAL_TABLET | Freq: Two times a day (BID) | ORAL | Status: DC
  Administered 2022-04-04: 2.5 mg via ORAL
  Filled 2022-04-04 (×2): qty 1

## 2022-04-04 MED ORDER — POLYETHYLENE GLYCOL 3350 17 G PO PACK: 17.0000 g | PACK | Freq: Every day | ORAL | Status: DC | PRN

## 2022-04-04 MED ORDER — SERTRALINE HCL 50 MG PO TABS: 50.0000 mg | ORAL_TABLET | Freq: Every day | ORAL | Status: DC

## 2022-04-04 MED ORDER — ACETAMINOPHEN 325 MG PO TABS
650.0000 mg | ORAL_TABLET | Freq: Four times a day (QID) | ORAL | Status: DC | PRN
  Administered 2022-04-05 – 2022-04-07 (×3): 650 mg via ORAL
  Filled 2022-04-04 (×3): qty 2

## 2022-04-04 MED ORDER — FUROSEMIDE 20 MG PO TABS: 20.0000 mg | ORAL_TABLET | ORAL | 2 refills | Status: DC

## 2022-04-04 MED ORDER — ACETAMINOPHEN 650 MG RE SUPP: 650.0000 mg | Freq: Four times a day (QID) | RECTAL | Status: DC | PRN

## 2022-04-04 MED ORDER — DILTIAZEM HCL 25 MG/5ML IV SOLN
15.0000 mg | Freq: Once | INTRAVENOUS | Status: DC
  Filled 2022-04-04: qty 5

## 2022-04-04 MED ORDER — FUROSEMIDE 40 MG PO TABS: 20.0000 mg | ORAL_TABLET | ORAL | Status: DC

## 2022-04-04 MED ORDER — DILTIAZEM HCL 25 MG/5ML IV SOLN
10.0000 mg | Freq: Once | INTRAVENOUS | Status: AC
  Administered 2022-04-04: 10 mg via INTRAVENOUS

## 2022-04-04 MED ORDER — PANTOPRAZOLE SODIUM 40 MG PO TBEC
40.0000 mg | DELAYED_RELEASE_TABLET | Freq: Every day | ORAL | Status: DC
  Administered 2022-04-05 – 2022-04-07 (×3): 40 mg via ORAL
  Filled 2022-04-04 (×3): qty 1

## 2022-04-04 MED ORDER — APIXABAN 5 MG PO TABS
5.0000 mg | ORAL_TABLET | Freq: Once | ORAL | Status: AC
  Administered 2022-04-04: 5 mg via ORAL
  Filled 2022-04-04: qty 1

## 2022-04-04 MED ORDER — IOHEXOL 350 MG/ML SOLN
75.0000 mL | Freq: Once | INTRAVENOUS | Status: AC | PRN
  Administered 2022-04-04: 75 mL via INTRAVENOUS

## 2022-04-04 MED ORDER — DILTIAZEM HCL 60 MG PO TABS
60.0000 mg | ORAL_TABLET | Freq: Once | ORAL | Status: AC
  Administered 2022-04-04: 60 mg via ORAL
  Filled 2022-04-04: qty 1

## 2022-04-04 MED ORDER — FUROSEMIDE 10 MG/ML IJ SOLN
20.0000 mg | Freq: Once | INTRAMUSCULAR | Status: AC
  Administered 2022-04-04: 20 mg via INTRAVENOUS

## 2022-04-04 MED ORDER — DILTIAZEM HCL 60 MG PO TABS
30.0000 mg | ORAL_TABLET | Freq: Four times a day (QID) | ORAL | Status: DC
  Administered 2022-04-04 – 2022-04-05 (×3): 30 mg via ORAL
  Filled 2022-04-04 (×4): qty 1

## 2022-04-04 MED ORDER — AMLODIPINE BESYLATE 5 MG PO TABS: 10.0000 mg | ORAL_TABLET | Freq: Every day | ORAL | Status: DC

## 2022-04-04 MED ORDER — ADULT MULTIVITAMIN W/MINERALS CH
1.0000 | ORAL_TABLET | Freq: Every day | ORAL | Status: DC
  Administered 2022-04-05 – 2022-04-07 (×3): 1 via ORAL
  Filled 2022-04-04 (×3): qty 1

## 2022-04-04 NOTE — Assessment & Plan Note (Signed)
Patient presenting with new onset atrial fibrillation in the setting of hypervolemia due to missed Lasix dose.  Echocardiogram in August 2023 did not show any major valvular dysfunction.  She has responded well to IV diltiazem and has not required infusion.  She was previously on Eliquis for DVT and will plan to continue at this time, as CHA2DS2-VASc is elevated at 6.  - Continue diltiazem, 30 mg p.o. every 6 hours - Telemetry monitoring - Continue Eliquis 2.5 mg twice daily - Outpatient follow-up with cardiology

## 2022-04-04 NOTE — Assessment & Plan Note (Signed)
CBC with stable WBC, hemoglobin and platelets.  - Continue outpatient follow-up with PCP

## 2022-04-04 NOTE — ED Triage Notes (Signed)
Patient reports having difficulty catching her breath this am.  Patient went out of town 10 days ago so patient has been without her meds bc they were lost.  Patient takes lasix eliquis lisinopril amlodipine atrovastation and multiple other meds.  Patient tachypneic and was in afib RVR.  EMS gave '10mg'$  Cardizem HR came down to 120-130.  Sats were fine upon EMS arrival.  Patient also complains of headache that started this am.

## 2022-04-04 NOTE — ED Notes (Signed)
In CT IV infiltrated in right forearm.  Skin tear along with large bruise and hematoma.  Gauze and coban applied and MD notified due to CT contrast.

## 2022-04-04 NOTE — ED Provider Notes (Signed)
[facility] Provider Note    Event Date/Time   First MD Initiated Contact with Patient 04/04/22 1146     (approximate)   History   Atrial Fibrillation and Shortness of Breath   HPI  Vanessa Farmer is a 86 y.o. female   Past medical history of hyperlipidemia, hypertension, prior MI, DVT being treated with Eliquis, presents with shortness of breath starting this morning at rest, orthopnea, leg swelling in the setting of not taking her medications for approximately 1 week after being lost during travel visiting her family out of state over this past 1 week.  She returned home most recently and had most of her prescriptions refilled by primary doctor but has not yet picked up.  EMS reports that she was found in atrial fibrillation with RVR in the 150s and normotensive and responded pretty well to 10 of IV diltiazem with a heart rate now around 100.  Patient says she feels better now with no symptoms currently.  She says her shortness of breath is worse when laying flat.  She denies chest pain, cough, fever, chills.  History was obtained via the patient.  EMS is present as independent historian to give collateral information as above as well as elaborating on her prehospital treatment and response.  I reviewed an external medical note including her primary care doctor Dr. Army Melia clinic note on 02/22/2022 with a plan to discontinue her DVT treatment Eliquis at the end of November.  At that time there was no mention of a history of atrial fibrillation.      Physical Exam   Triage Vital Signs: ED Triage Vitals  Enc Vitals Group     BP --      Pulse --      Resp --      Temp --      Temp src --      SpO2 04/04/22 1147 95 %     Weight 04/04/22 1150 105 lb (47.6 kg)     Height 04/04/22 1150 5' (1.524 m)     Head Circumference --      Peak Flow --      Pain Score 04/04/22 1150 10     Pain Loc --      Pain Edu? --      Excl. in [place]? --     Most recent  vital signs: Vitals:   04/04/22 1330 04/04/22 1500  BP: 139/74 (!) 150/69  Pulse: 86 79  Resp: (!) 22 (!) 27  Temp:    SpO2: 97% 96%    General: Awake, no distress.  CV:  Good peripheral perfusion.  Resp:  Normal effort.  Abd:  No distention.  Other:  Awake alert and pleasant.  Minimal rales to auscultation bilateral lung bases.  No respiratory distress.  She is tachycardic 110s to 120s with a normal blood pressure A-fib RVR.   ED Results / Procedures / Treatments   Labs (all labs ordered are listed, but only abnormal results are displayed) Labs Reviewed  BASIC METABOLIC PANEL - Abnormal; Notable for the following components:      Result Value   Glucose, Bld 132 (*)    Calcium 8.5 (*)    GFR, Estimated 56 (*)    All other components within normal limits  CBC - Abnormal; Notable for the following components:   WBC 3.3 (*)    RBC 3.51 (*)    Hemoglobin 11.4 (*)    HCT 34.4 (*)  Platelets 144 (*)    All other components within normal limits  BRAIN NATRIURETIC PEPTIDE - Abnormal; Notable for the following components:   B Natriuretic Peptide 953.7 (*)    All other components within normal limits  TROPONIN I (HIGH SENSITIVITY) - Abnormal; Notable for the following components:   Troponin I (High Sensitivity) 21 (*)    All other components within normal limits  RESP PANEL BY RT-PCR (FLU A&B, COVID) ARPGX2  MAGNESIUM  TROPONIN I (HIGH SENSITIVITY)     I reviewed labs and they are notable for trop 16--> 21 and elevated BNP 900s  EKG  ED ECG REPORT I, Lucillie Garfinkel, the attending physician, personally viewed and interpreted this ECG.   Date: 04/04/2022  EKG Time: 1151  Rate: 88  Rhythm: atrial fibrillation, rate 80s  Axis: nl  Intervals:lbbb  ST&T Change: no ischemic changes acutely    RADIOLOGY I independently reviewed and interpreted chest x-ray and see evidence of pulmonary vascular congestion   PROCEDURES:  Critical Care performed: Yes, see critical care  procedure note(s)  .Critical Care  Performed by: Lucillie Garfinkel, MD Authorized by: Lucillie Garfinkel, MD   Critical care provider statement:    Critical care time (minutes):  30   Critical care was necessary to treat or prevent imminent or life-threatening deterioration of the following conditions: Atrial fibrillation with rapid ventricular response requiring IV rate control.   Critical care was time spent personally by me on the following activities:  Development of treatment plan with patient or surrogate, evaluation of patient's response to treatment, examination of patient, ordering and review of laboratory studies, ordering and review of radiographic studies, ordering and performing treatments and interventions, re-evaluation of patient's condition and review of old charts    MEDICATIONS ORDERED IN ED: Medications  apixaban (ELIQUIS) tablet 5 mg (5 mg Oral Given 04/04/22 1217)  furosemide (LASIX) injection 20 mg (20 mg Intravenous Given 04/04/22 1217)  diltiazem (CARDIZEM) injection 10 mg (10 mg Intravenous Given 04/04/22 1217)  diltiazem (CARDIZEM) tablet 60 mg (60 mg Oral Given 04/04/22 1243)  iohexol (OMNIPAQUE) 350 MG/ML injection 75 mL (75 mLs Intravenous Contrast Given 04/04/22 1408)    Consultants:  I spoke with hospitalist re admission & regarding care plan for this patient.   IMPRESSION / MDM / ASSESSMENT AND PLAN / ED COURSE  I reviewed the triage vital signs and the nursing notes.                              Differential diagnosis includes, but is not limited to, pulmonary edema, atrial fibrillation with RVR, ACS, PE, respiratory infection   The patient is on the cardiac monitor to evaluate for evidence of arrhythmia and/or significant heart rate changes.  MDM: Patient who requires diuresis in the setting of missing her Lasix for the past 1 week and evidence of rales on examination.  She has new atrial fibrillation with RVR that has been responsive to IV diltiazem given by  EMS but is now refractory, will give another dose and chase p.o. for rate control.  She had been on Eliquis for DVT treatment, but since she has not been medicated in over 1 week and now has shortness of breath and tachycardia we will obtain a CT angiogram of the chest to rule out pulmonary embolism.  She has, however nearly completed her 20-monthcourse of Eliquis for DVT and was scheduled to discontinue around the end of this month  anyhow, and has no leg symptoms to suggest ongoing DVT.  I think most of her symptoms is due to pulmonary edema in the setting of missing her Lasix, as well as atrial fibrillation with RVR.  She is asymptomatic now that rate is controlled and she has been diuresed in the emergency department.  CTA neg for pulm embolism.  She had an extravasation of the contrast to her right arm which I assessed at this time and is neurovascular intact with some swelling to the forearm.  I ordered for extravasation treatment.  She remains in atrial fibrillation rate controlled in the 80s and normotensive at this time.  I rewrote her for Eliquis while she is in the emergency department.  She attempted to ambulate in the emergency department but felt short of breath.  She will be admitted for new onset atrial fibrillation.   Patient's presentation is most consistent with acute presentation with potential threat to life or bodily function.       FINAL CLINICAL IMPRESSION(S) / ED DIAGNOSES   Final diagnoses:  Shortness of breath  Acute pulmonary edema (HCC)  Atrial fibrillation with RVR ([place])     Rx / DC Orders   ED Discharge Orders          Ordered    apixaban (ELIQUIS) 5 MG TABS tablet  2 times daily        04/04/22 1206    furosemide (LASIX) 20 MG tablet  Every other day        04/04/22 1206             Note:  This document was prepared using Dragon voice recognition software and may include unintentional dictation errors.    Lucillie Garfinkel, MD 04/04/22 [phone]

## 2022-04-04 NOTE — ED Notes (Signed)
Up to bathroom

## 2022-04-04 NOTE — Assessment & Plan Note (Addendum)
Patient presenting with pulmonary edema with elevated BNP in the setting of medication noncompliance, as patient lost her medications while visiting relatives out of town.  She has responded well to Lasix dose in the ED. echocardiogram in August 2023 with preserved EF at 55-60% and grade 1 diastolic dysfunction.  - Restart home Lasix tomorrow - Daily weights - Strict in and out

## 2022-04-04 NOTE — ED Notes (Signed)
Placed purwick due to the fact patient gets extremely short of breath when going to the bathroom.  Patient sats stay normal but her RR increases to like 40.  I try to explain to take slow deep breaths but she hyperventilates.

## 2022-04-04 NOTE — ED Notes (Signed)
Up to bathroom again at this time

## 2022-04-04 NOTE — Assessment & Plan Note (Signed)
-   Restart home antihypertensives

## 2022-04-04 NOTE — H&P (Signed)
History and Physical    Patient: Vanessa Farmer VOH:[id number] DOB: 04-06-1936 DOA: 04/04/2022 DOS: the patient was seen and examined on 04/04/2022 PCP: Glean Hess, MD  Patient coming from: Home  Chief Complaint:  Chief Complaint  Patient presents with   Atrial Fibrillation   Shortness of Breath   HPI: Vanessa Farmer is a 86 y.o. female with medical history significant of HFpEF, DVT on Eliquis, hypertension, hyperlipidemia, CAD, depression, who presents to the ED with complaints of shortness of breath and palpitations.  Vanessa Farmer states that she went to visit her sons in [place] 2 weeks ago and while there she lost all of her home medications.  Due to this, she has not been taking any medications.  In this time period, she has been experiencing dyspnea on exertion that has been intermittent only.  Today, she awoke with significant worsening in her breathing.  She felt that she cannot catch her breath at all and her heart was pounding very quickly.  She denies any chest pain at that time.  She denies any other symptoms at this time including fever, chills, N/V/D, abdominal pain, urinary or bowel changes.  She denies any lower extremity edema.  ED course: On EMS arrival, patient's heart rate was in the 150s with telemetry demonstrating atrial fibrillation with RVR.  She was given 10 mg of IV Dilaudid with improvement into the low 100s.  On arrival to the ED, patient was tachycardic up to 108 with blood pressure 144/93.  She was tachypneic at 28/min with oxygen saturation of 97% on room air.  Initial workup remarkable for elevated BNP at 953.  Troponin normal with flat trend to 21.  Chest x-ray with vascular congestion.  CTA with no evidence of PE, however bilateral pleural effusions.  TRH contacted for admission for new onset A-fib with RVR and acute heart failure.  Review of Systems: As mentioned in the history of present illness. All other systems reviewed and are negative. Past  Medical History:  Diagnosis Date   Anemia    Cataract    Depression    Fall    GERD (gastroesophageal reflux disease)    Hyperlipidemia    Hypertension    Myocardial infarction [facility])    "mild" many yrs ago   Past Surgical History:  Procedure Laterality Date   ABDOMINAL HYSTERECTOMY     CATARACT EXTRACTION, BILATERAL  2021   COLONOSCOPY WITH PROPOFOL N/A 12/05/2017   Procedure: COLONOSCOPY WITH PROPOFOL;  Surgeon: Lucilla Lame, MD;  Location: [place];  Service: Endoscopy;  Laterality: N/A;   ESOPHAGOGASTRODUODENOSCOPY (EGD) WITH PROPOFOL N/A 12/05/2017   Procedure: ESOPHAGOGASTRODUODENOSCOPY (EGD) WITH PROPOFOL;  Surgeon: Lucilla Lame, MD;  Location: [place];  Service: Endoscopy;  Laterality: N/A;   POLYPECTOMY  12/05/2017   Procedure: POLYPECTOMY;  Surgeon: Lucilla Lame, MD;  Location: [place];  Service: Endoscopy;;   TONSILLECTOMY     Social History:  reports that she has never smoked. She has never used smokeless tobacco. She reports that she does not drink alcohol and does not use drugs.  No Known Allergies  Family History  Problem Relation Age of Onset   Heart disease Mother    Brain cancer Mother    Heart attack Father    Liver disease Daughter    Throat cancer Brother    Cancer Maternal Grandfather        Unsure type    Prior to Admission medications   Medication Sig Start Date End  Date Taking? Authorizing Provider  amLODipine (NORVASC) 10 MG tablet TAKE 1 TABLET(10 MG) BY MOUTH DAILY 09/23/21   Glean Hess, MD  apixaban (ELIQUIS) 5 MG TABS tablet Take 1 tablet (5 mg total) by mouth 2 (two) times daily for 14 days. From 01/07/22--take 5 mg twice a day 04/04/22 04/18/22  Lucillie Garfinkel, MD  atorvastatin (LIPITOR) 40 MG tablet Take 1 tablet (40 mg total) by mouth daily. 01/19/22   Glean Hess, MD  ENSURE (ENSURE) Take 237 mLs by mouth 2 (two) times daily between meals. 10/20/17 10/17/20  Glean Hess, MD  furosemide (LASIX) 20  MG tablet Take 1 tablet (20 mg total) by mouth every other day for 90 doses. 04/04/22 09/30/22  Lucillie Garfinkel, MD  lisinopril (ZESTRIL) 20 MG tablet Take 1 tablet (20 mg total) by mouth daily. 01/19/22   Glean Hess, MD  Multiple Vitamins-Calcium (ONE-A-DAY WOMENS PO) Take 1 tablet by mouth daily.    [provider]  pantoprazole (PROTONIX) 40 MG tablet TAKE 1 TABLET(40 MG) BY MOUTH DAILY 03/08/22   Glean Hess, MD  sertraline (ZOLOFT) 50 MG tablet TAKE 1 TABLET(50 MG) BY MOUTH DAILY 09/23/21   Glean Hess, MD  traMADol (ULTRAM) 50 MG tablet Take 1 tablet (50 mg total) by mouth every 8 (eight) hours as needed for severe pain. 01/02/22   Fritzi Mandes, MD    Physical Exam: Vitals:   04/04/22 1500 04/04/22 1600 04/04/22 1624 04/04/22 1700  BP: (!) 150/69 127/89  (!) 141/47  Pulse: 79 76  66  Resp: (!) '27 19  20  '$ Temp:   98.9 F (37.2 C)   TempSrc:   Oral   SpO2: 96% 95%  95%  Weight:      Height:       Physical Exam Vitals and nursing note reviewed.  Constitutional:      General: She is not in acute distress.    Appearance: She is normal weight. She is not toxic-appearing.  HENT:     Head: Normocephalic and atraumatic.  Neck:     Vascular: No JVD.  Cardiovascular:     Rate and Rhythm: Normal rate. Rhythm irregularly irregular.     Pulses: Normal pulses.     Heart sounds: No murmur heard. Pulmonary:     Effort: Pulmonary effort is normal.     Breath sounds: Rales (Bibasilar) present. No decreased breath sounds, wheezing or rhonchi.  Abdominal:     Palpations: Abdomen is soft.     Tenderness: There is no abdominal tenderness. There is no guarding.  Musculoskeletal:     Cervical back: Neck supple.     Right lower leg: No edema.     Left lower leg: No edema.  Skin:    General: Skin is warm and dry.  Neurological:     General: No focal deficit present.     Mental Status: She is alert and oriented to person, place, and time.  Psychiatric:        Mood and  Affect: Mood normal.        Behavior: Behavior normal.    Data Reviewed: CBC with WBC of 3.1, hemoglobin of 11.4, platelets of 144.  BMP with potassium of 3.5, glucose of 132, creatinine of 0.99, GFR 56 and calcium of 8.5.  BNP elevated at 953.  Initial troponin 16 and second troponin 21.  Magnesium within normal limits.  COVID-19 PCR and influenza PCR negative.  EKG personally reviewed.  Irregular rhythm with  no evidence of P waves consistent with atrial fibrillation.  Rate of 88.  LVH with repolarization changes consistent with EKG obtained on 12/30/2021.  No other major changes to EKG compared to prior other than rhythm changed from sinus to A-fib.  CT Angio Chest PE W/Cm &/Or Wo Cm  Result Date: 04/04/2022 CLINICAL DATA:  86 year old female with shortness of breath. EXAM: CT ANGIOGRAPHY CHEST WITH CONTRAST TECHNIQUE: Multidetector CT imaging of the chest was performed using the standard protocol during bolus administration of intravenous contrast. Multiplanar CT image reconstructions and MIPs were obtained to evaluate the vascular anatomy. RADIATION DOSE REDUCTION: This exam was performed according to the departmental dose-optimization program which includes automated exposure control, adjustment of the mA and/or kV according to patient size and/or use of iterative reconstruction technique. CONTRAST:  61m OMNIPAQUE IOHEXOL 350 MG/ML SOLN COMPARISON:  12/31/2021 chest CT. FINDINGS: Cardiovascular: Majority of the study is technically satisfactory but during the initial run, IV tubing leaked and the lung bases were not imaged. An additional sequence was performed but slightly delayed with less than optimal contrast opacification of the basilar pulmonary arteries. No pulmonary emboli are identified. Cardiomegaly and coronary artery and aortic atherosclerotic calcifications are again noted. There is no evidence of thoracic aortic aneurysm or pericardial effusion. Mediastinum/Nodes: No enlarged  mediastinal, hilar, or axillary lymph nodes. Thyroid gland, trachea, and esophagus demonstrate no significant findings. A large hiatal hernia is again noted. Lungs/Pleura: There is no evidence of airspace disease, consolidation, mass, suspicious nodule or pneumothorax. Trace bilateral pleural effusions are noted, decreased on the RIGHT. Scattered areas of scarring within both lungs again identified. Upper Abdomen: No acute abnormality. Musculoskeletal: No acute or suspicious bony abnormalities are noted. Remote bilateral rib fractures again identified. Review of the MIP images confirms the above findings. IMPRESSION: 1. No evidence of pulmonary emboli or thoracic aortic aneurysm. 2. Trace bilateral pleural effusions, decreased on the RIGHT. 3. Cardiomegaly and coronary artery disease. Aortic Atherosclerosis (ICD10-I70.0). 4. Large hiatal hernia. Electronically Signed   By: JMargarette CanadaM.D.   On: 04/04/2022 15:07   DG Chest 2 View  Result Date: 04/04/2022 CLINICAL DATA:  Shortness of breath. EXAM: CHEST - 2 VIEW COMPARISON:  08/30/2021 FINDINGS: Stable cardiomediastinal contours. Aortic atherosclerotic calcifications. Large hiatal hernia is again noted. Blunting of the right posterior costophrenic angle is best seen on the lateral projection radiograph and is concerning for a small effusion. Pulmonary vascular congestion noted without frank edema. No airspace opacities. Healing right lateral rib fractures are again seen. IMPRESSION: 1. Suspect small right pleural effusion. 2. Pulmonary vascular congestion. 3. Healing right lateral rib fractures. Electronically Signed   By: TKerby MoorsM.D.   On: 04/04/2022 12:19    Results are pending, will review when available.  Assessment and Plan: * At[place]VR ([place] presenting with new onset atrial fibrillation in the setting of hypervolemia due to missed Lasix dose.  Echocardiogram in August 2023 did not show any major valvular  dysfunction.  She has responded well to IV diltiazem and has not required infusion.  She was previously on Eliquis for DVT and will plan to continue at this time, as CHA2DS2-VASc is elevated at 6.  - Continue diltiazem, 30 mg p.o. every 6 hours - Telemetry monitoring - Continue Eliquis 2.5 mg twice daily - Outpatient follow-up with cardiology  Acute CHF ([place]estive heart failure) ([place] Patient presenting with pulmonary edema with elevated BNP in the setting of medication noncompliance, as patient lost her medications while visiting  relatives out of town.  She has responded well to Lasix dose in the ED. echocardiogram in August 2023 with preserved EF at 55-60% and grade 1 diastolic dysfunction.  - Restart home Lasix tomorrow - Daily weights - Strict in and out  Pancytopenia (HCC) CBC with stable WBC, hemoglobin and platelets.  - Continue outpatient follow-up with PCP  CAD (coronary artery disease) - Continue home Eliquis and statin  Essential hypertension - Restart home antihypertensives  Advance Care Planning:   Code Status: Full Code.  Discussed CODE STATUS with Vanessa Farmer.  She states that at this time she is not sure what she would want in the case of an emergency and due to this, she would prefer to remain full code until she can make a decision.  Consults: None  Family Communication: No family at bedside  Severity of Illness: The appropriate patient status for this patient is OBSERVATION. Observation status is judged to be reasonable and necessary in order to provide the required intensity of service to ensure the patient's safety. The patient's presenting symptoms, physical exam findings, and initial radiographic and laboratory data in the context of their medical condition is felt to place them at decreased risk for further clinical deterioration. Furthermore, it is anticipated that the patient will be medically stable for discharge from the hospital within 2 midnights of  admission.   Author: Jose Persia, MD 04/04/2022 5:21 PM  For on call review [email or web address].

## 2022-04-04 NOTE — Assessment & Plan Note (Signed)
-   Continue home Eliquis and statin

## 2022-04-04 NOTE — Discharge Instructions (Addendum)
Your primary doctor sent refills for most of your prescriptions to pick up at the pharmacy.  I sent additional prescription refills for your Eliquis blood thinner and your Lasix water pill to take until you are able to see your doctor to review all medications moving forward.  Your heartbeat was irregular today, in a rhythm called atrial fibrillation.  Low Sodium Nutrition Therapy  Eating less sodium can help you if you have high blood pressure, heart failure, or kidney or liver disease.   Your body needs a little sodium, but too much sodium can cause your body to hold onto extra water. This extra water will raise your blood pressure and can cause damage to your heart, kidneys, or liver as they are forced to work harder.   Sometimes you can see how the extra fluid affects you because your hands, legs, or belly swell. You may also hold water around your heart and lungs, which makes it hard to breathe.   Even if you take medication for blood pressure or a water pill (diuretic) to remove fluid, it is still important to have less salt in your diet.   Check with your primary care provider before drinking alcohol since it may affect the amount of fluid in your body and how your heart, kidneys, or liver work. Sodium in Food A low-sodium meal plan limits the sodium that you get from food and beverages to 1,500-2,000 milligrams (mg) per day. Salt is the main source of sodium. Read the nutrition label on the package to find out how much sodium is in one serving of a food.  Select foods with 140 milligrams (mg) of sodium or less per serving.  You may be able to eat one or two servings of foods with a little more than 140 milligrams (mg) of sodium if you are closely watching how much sodium you eat in a day.  Check the serving size on the label. The amount of sodium listed on the label shows the amount in one serving of the food. So, if you eat more than one serving, you will get more sodium than the amount  listed.  Tips Cutting Back on Sodium Eat more fresh foods.  Fresh fruits and vegetables are low in sodium, as well as frozen vegetables and fruits that have no added juices or sauces.  Fresh meats are lower in sodium than processed meats, such as bacon, sausage, and hotdogs.  Not all processed foods are unhealthy, but some processed foods may have too much sodium.  Eat less salt at the table and when cooking. One of the ingredients in salt is sodium.  One teaspoon of table salt has 2,300 milligrams of sodium.  Leave the salt out of recipes for pasta, casseroles, and soups. Be a Paramedic.  Food packages that say "Salt-free", sodium-free", "very low sodium," and "low sodium" have less than 140 milligrams of sodium per serving.  Beware of products identified as "Unsalted," "No Salt Added," "Reduced Sodium," or "Lower Sodium." These items may still be high in sodium. You should always check the nutrition label. Add flavors to your food without adding sodium.  Try lemon juice, lime juice, or vinegar.  Dry or fresh herbs add flavor.  Buy a sodium-free seasoning blend or make your own at home. You can purchase salt-free or sodium-free condiments like barbeque sauce in stores and online. Ask your registered dietitian nutritionist for recommendations and where to find them.   Eating in Restaurants Choose foods carefully when you  eat outside your home. Restaurant foods can be very high in sodium. Many restaurants provide nutrition facts on their menus or their websites. If you cannot find that information, ask your server. Let your server know that you want your food to be cooked without salt and that you would like your salad dressing and sauces to be served on the side.    Foods Recommended Food Group Foods Recommended  Grains Bread, bagels, rolls without salted tops Homemade bread made with reduced-sodium baking powder Cold cereals, especially shredded wheat and puffed rice Oats, grits, or  cream of wheat Pastas, quinoa, and rice Popcorn, pretzels or crackers without salt Corn tortillas  Protein Foods Fresh meats and fish; [place] bacon (check the nutrition labels - make sure they are not packaged in a sodium solution) Canned or packed tuna (no more than 4 ounces at 1 serving) Beans and peas Soybeans) and tofu Eggs Nuts or nut butters without salt  Dairy Milk or milk powder Plant milks, such as rice and soy Yogurt, including Greek yogurt Small amounts of natural cheese (blocks of cheese) or reduced-sodium cheese can be used in moderation. (Swiss, ricotta, and fresh mozzarella cheese are lower in sodium than the others) Cream Cheese Low sodium cottage cheese  Vegetables Fresh and frozen vegetables without added sauces or salt Homemade soups (without salt) Low-sodium, salt-free or sodium-free canned vegetables and soups  Fruit Fresh and canned fruits Dried fruits, such as raisins, cranberries, and prunes  Oils Tub or liquid margarine, regular or without salt Canola, corn, peanut, olive, safflower, or sunflower oils  Condiments Fresh or dried herbs such as basil, bay leaf, dill, mustard (dry), nutmeg, paprika, parsley, rosemary, sage, or thyme.  Low sodium ketchup Vinegar  Lemon or lime juice Pepper, red pepper flakes, and cayenne. Hot sauce contains sodium, but if you use just a drop or two, it will not add up to much.  Salt-free or sodium-free seasoning mixes and marinades Simple salad dressings: vinegar and oil   Foods Not Recommended Food Group Foods Not Recommended  Grains Breads or crackers topped with salt Cereals (hot/cold) with more than 300 mg sodium per serving Biscuits, cornbread, and other "quick" breads prepared with baking soda Pre-packaged bread crumbs Seasoned and packaged rice and pasta mixes Self-rising flours  Protein Foods Cured meats: Bacon, ham, sausage, pepperoni and hot dogs Canned meats (chili, vienna sausage, or sardines) Smoked fish and  meats Frozen meals that have more than 600 mg of sodium per serving Egg substitute (with added sodium)  Dairy Buttermilk Processed cheese spreads Cottage cheese (1 cup may have over 500 mg of sodium; look for low-sodium.) American or feta cheese Shredded Cheese has more sodium than blocks of cheese String cheese  Vegetables Canned vegetables (unless they are salt-free, sodium-free or low sodium) Frozen vegetables with seasoning and sauces Sauerkraut and pickled vegetables Canned or dried soups (unless they are salt-free, sodium-free, or low sodium) [place] fries and onion rings  Fruit Dried fruits preserved with additives that have sodium  Oils Salted butter or margarine, all types of olives  Condiments Salt, sea salt, kosher salt, onion salt, and garlic salt Seasoning mixes with salt Bouillon cubes Ketchup Barbeque sauce and Worcestershire sauce unless low sodium Soy sauce Salsa, pickles, olives, relish Salad dressings: ranch, blue cheese, [place], and [place].   Low Sodium Sample 1-Day Menu  Breakfast 1 cup cooked oatmeal  1 slice whole wheat bread toast  1 tablespoon peanut butter without salt  1 banana  1 cup 1%  milk  Lunch Tacos made with: 2 corn tortillas   cup black beans, low sodium   cup roasted or grilled chicken (without skin)   avocado  Squeeze of lime juice  1 cup salad greens  1 tablespoon low-sodium salad dressing   cup strawberries  1 orange  Afternoon Snack 1/3 cup grapes  6 ounces yogurt  Evening Meal 3 ounces herb-baked fish  1 baked potato  2 teaspoons olive oil   cup cooked carrots  2 thick slices tomatoes on:  2 lettuce leaves  1 teaspoon olive oil  1 teaspoon balsamic vinegar  1 cup 1% milk  Evening Snack 1 apple   cup almonds without salt   Low-Sodium Vegetarian (Lacto-Ovo) Sample 1-Day Menu  Breakfast 1 cup cooked oatmeal  1 slice whole wheat toast  1 tablespoon peanut butter without salt  1 banana  1 cup 1% milk  Lunch Tacos  made with: 2 corn tortillas   cup black beans, low sodium   cup roasted or grilled chicken (without skin)   avocado  Squeeze of lime juice  1 cup salad greens  1 tablespoon low-sodium salad dressing   cup strawberries  1 orange  Evening Meal Stir fry made with:  cup tofu  1 cup brown rice   cup broccoli   cup green beans   cup peppers   tablespoon peanut oil  1 orange  1 cup 1% milk  Evening Snack 4 strips celery  2 tablespoons hummus  1 hard-boiled egg   Low-Sodium Vegan Sample 1-Day Menu  Breakfast 1 cup cooked oatmeal  1 tablespoon peanut butter without salt  1 cup blueberries  1 cup soymilk fortified with calcium, vitamin B12, and vitamin D  Lunch 1 small whole wheat pita   cup cooked lentils  2 tablespoons hummus  4 carrot sticks  1 medium apple  1 cup soymilk fortified with calcium, vitamin B12, and vitamin D  Evening Meal Stir fry made with:  cup tofu  1 cup brown rice   cup broccoli   cup green beans   cup peppers   tablespoon peanut oil  1 cup cantaloupe  Evening Snack 1 cup soy yogurt   cup mixed nuts  Copyright 2020  [employer]y [employer]. All rights reserved  Sodium Free Flavoring Tips  When cooking, the following items may be used for flavoring instead of salt or seasonings that contain sodium. Remember: A little bit of spice goes a long way! Be careful not to overseason. Spice Blend Recipe (makes about ? cup) 5 teaspoons onion powder  2 teaspoons garlic powder  2 teaspoons paprika  2 teaspoon dry mustard  1 teaspoon crushed thyme leaves   teaspoon white pepper   teaspoon celery seed Food Item Flavorings  Beef Basil, bay leaf, caraway, curry, dill, dry mustard, garlic, grape jelly, green pepper, mace, marjoram, mushrooms (fresh), nutmeg, onion or onion powder, parsley, pepper, rosemary, sage  Chicken Basil, cloves, cranberries, mace, mushrooms (fresh), nutmeg, oregano, paprika, parsley, pineapple, saffron, sage,  savory, tarragon, thyme, tomato, turmeric  Egg Chervil, curry, dill, dry mustard, garlic or garlic powder, green pepper, jelly, mushrooms (fresh), nutmeg, onion powder, paprika, parsley, rosemary, tarragon, tomato  Fish Basil, bay leaf, chervil, curry, dill, dry mustard, green pepper, lemon juice, marjoram, mushrooms (fresh), paprika, pepper, tarragon, tomato, turmeric  Lamb Cloves, curry, dill, garlic or garlic powder, mace, mint, mint jelly, onion, oregano, parsley, pineapple, rosemary, tarragon, thyme  Pork Applesauce, basil, caraway, chives, cloves, garlic or garlic  powder, onion or onion powder, rosemary, thyme  Veal Apricots, basil, bay leaf, currant jelly, curry, ginger, marjoram, mushrooms (fresh), oregano, paprika  Vegetables Basil, dill, garlic or garlic powder, ginger, lemon juice, mace, marjoram, nutmeg, onion or onion powder, tarragon, tomato, sugar or sugar substitute, salt-free salad dressing, vinegar  Desserts Allspice, anise, cinnamon, cloves, ginger, mace, nutmeg, vanilla extract, other extracts   Copyright 2020  [employer]y [employer]. All rights reserved  Fluid Restricted Nutrition Therapy  You have been prescribed this diet because your condition affects how much fluid you can eat or drink. If your heart, liver, or kidneys aren't working properly, you may not be able to effectively eliminate fluids from the body and this may cause swelling (edema) in the legs, arms, and/or stomach. Drink no more than _________ liters or ________ ounces or ________cups of fluid per day.  You don't need to stop eating or drinking the same fluids you normally would, but you may need to eat or drink less than usual.  Your registered dietitian nutritionist will help you determine the correct amount of fluid to consume during the day Breakfast Include fluids taken with medications  Lunch Include fluids taken with medications  Dinner Include fluids taken with medications  Bedtime Snack  Include fluids taken with medications     Tips What Are Fluids?  A fluid is anything that is liquid or anything that would melt if left at room temperature. You will need to count these foods and liquids--including any liquid used to take medication--as part of your daily fluid intake. Some examples are: Alcohol (drink only with your doctor's permission)  Coffee, tea, and other hot beverages  Gelatin (Jell-O)  Gravy  Ice cream, sherbet, sorbet  Ice cubes, ice chips  Milk, liquid creamer  Nutritional supplements  Popsicles  Vegetable and fruit juices; fluid in canned fruit  Watermelon  Yogurt  Soft drinks, lemonade, limeade  Soups  Syrup How Do I Measure My Fluid Intake? Record your fluid intake daily.  Tip: Every day, each time you eat or drink fluids, pour water in the same amount into an empty container that can hold the same amount of fluids you are allowed daily. This may help you keep track of how much fluid you are taking in throughout the day.  To accurately keep track of how much liquid you take in, measure the size of the cups, glasses, and bowls you use. If you eat soup, measure how much of it is liquid and how much is solid (such as noodles, vegetables, meat). Conversions for Measuring Fluid Intake  Milliliters (mL) Liters (L) Ounces (oz) Cups (c)  1000 1 32 4  1200 1.2 40 5  1500 1.5 50 6 1/4  1800 1.8 60 7 1/2  2000 2 67 8 1/3  Tips to Reduce Your Thirst Chew gum or suck on hard candy.  Rinse or gargle with mouthwash. Do not swallow.  Ice chips or popsicles my help quench thirst, but this too needs to be calculated into the total restriction. Melt ice chips or cubes first to figure out how much fluid they produce (for example, experiment with melting  cup ice chips or 2 ice cubes).  Add a lemon wedge to your water.  Limit how much salt you take in. A high salt intake might make you thirstier.  Don't eat or drink all your allowed liquids at once. Space your  liquids out through the day.  Use small glasses and cups and sip  slowly. If allowed, take your medications with fluids you eat or drink during a meal.   Fluid-Restricted Nutrition Therapy Sample 1-Day Menu  Breakfast 1 slice wheat toast  1 tablespoon peanut butter  1/2 cup yogurt (120 milliliters)  1/2 cup blueberries  1 cup milk (240 milliliters)   Lunch 3 ounces sliced [place]  2 slices whole wheat bread  1/2 cup lettuce for sandwich  2 slices tomato for sandwich  1 ounce reduced-fat, reduced-sodium cheese  1/2 cup fresh carrot sticks  1 banana  1 cup unsweetened tea (240 milliliters)   Evening Meal 8 ounces soup (240 milliliters)  3 ounces salmon  1/2 cup quinoa  1 cup green beans  1 cup mixed greens salad  1 tablespoon olive oil  1 cup coffee (240 milliliters)  Evening Snack 1/2 cup sliced peaches  1/2 cup frozen yogurt (120 milliliters)  1 cup water (240 milliliters)  Copyright 2020  Academy [employer]. All rights reserved

## 2022-04-04 NOTE — ED Provider Notes (Signed)
Care assumed.  Recent trip with loss of medications.  Eliquis for DVT. New sob and Afib.  CTA showed no signs of pulmonary embolism.  Some pleural effusions and signs of pulmonary edema.  Does have an elevated BNP in the 900s.  Given Lasix and had good diuresis.  Continues to be symptomatic and short of breath but no acute hypoxia.  Converted back to normal sinus rhythm from her atrial fibrillation after diltiazem.  Plan to admit to the hospitalist for new onset paroxysmal atrial fibrillation and heart failure exacerbation   Nathaniel Man, MD 04/04/22 1518

## 2022-04-05 DIAGNOSIS — Z1152 Encounter for screening for COVID-19: Secondary | ICD-10-CM | POA: Diagnosis not present

## 2022-04-05 DIAGNOSIS — Z7901 Long term (current) use of anticoagulants: Secondary | ICD-10-CM | POA: Diagnosis not present

## 2022-04-05 DIAGNOSIS — R5381 Other malaise: Secondary | ICD-10-CM | POA: Diagnosis present

## 2022-04-05 DIAGNOSIS — Z8249 Family history of ischemic heart disease and other diseases of the circulatory system: Secondary | ICD-10-CM | POA: Diagnosis not present

## 2022-04-05 DIAGNOSIS — R002 Palpitations: Secondary | ICD-10-CM | POA: Diagnosis present

## 2022-04-05 DIAGNOSIS — D61818 Other pancytopenia: Secondary | ICD-10-CM | POA: Diagnosis present

## 2022-04-05 DIAGNOSIS — Z79899 Other long term (current) drug therapy: Secondary | ICD-10-CM | POA: Diagnosis not present

## 2022-04-05 DIAGNOSIS — N1831 Chronic kidney disease, stage 3a: Secondary | ICD-10-CM | POA: Diagnosis present

## 2022-04-05 DIAGNOSIS — I4891 Unspecified atrial fibrillation: Secondary | ICD-10-CM | POA: Diagnosis present

## 2022-04-05 DIAGNOSIS — I10 Essential (primary) hypertension: Secondary | ICD-10-CM | POA: Diagnosis not present

## 2022-04-05 DIAGNOSIS — I2510 Atherosclerotic heart disease of native coronary artery without angina pectoris: Secondary | ICD-10-CM | POA: Diagnosis present

## 2022-04-05 DIAGNOSIS — I480 Paroxysmal atrial fibrillation: Secondary | ICD-10-CM | POA: Diagnosis present

## 2022-04-05 DIAGNOSIS — E785 Hyperlipidemia, unspecified: Secondary | ICD-10-CM | POA: Diagnosis present

## 2022-04-05 DIAGNOSIS — Z91148 Patient's other noncompliance with medication regimen for other reason: Secondary | ICD-10-CM | POA: Diagnosis not present

## 2022-04-05 DIAGNOSIS — K219 Gastro-esophageal reflux disease without esophagitis: Secondary | ICD-10-CM | POA: Diagnosis present

## 2022-04-05 DIAGNOSIS — I5030 Unspecified diastolic (congestive) heart failure: Secondary | ICD-10-CM | POA: Insufficient documentation

## 2022-04-05 DIAGNOSIS — Z808 Family history of malignant neoplasm of other organs or systems: Secondary | ICD-10-CM | POA: Diagnosis not present

## 2022-04-05 DIAGNOSIS — Z86718 Personal history of other venous thrombosis and embolism: Secondary | ICD-10-CM

## 2022-04-05 DIAGNOSIS — I130 Hypertensive heart and chronic kidney disease with heart failure and stage 1 through stage 4 chronic kidney disease, or unspecified chronic kidney disease: Secondary | ICD-10-CM | POA: Diagnosis present

## 2022-04-05 DIAGNOSIS — I252 Old myocardial infarction: Secondary | ICD-10-CM | POA: Diagnosis not present

## 2022-04-05 DIAGNOSIS — I5033 Acute on chronic diastolic (congestive) heart failure: Secondary | ICD-10-CM | POA: Diagnosis present

## 2022-04-05 DIAGNOSIS — F32A Depression, unspecified: Secondary | ICD-10-CM | POA: Diagnosis present

## 2022-04-05 LAB — BASIC METABOLIC PANEL
Anion gap: 5 (ref 5–15)
BUN: 17 mg/dL (ref 8–23)
CO2: 31 mmol/L (ref 22–32)
Calcium: 8.6 mg/dL — ABNORMAL LOW (ref 8.9–10.3)
Chloride: 100 mmol/L (ref 98–111)
Creatinine, Ser: 1.2 mg/dL — ABNORMAL HIGH (ref 0.44–1.00)
GFR, Estimated: 45 mL/min — ABNORMAL LOW (ref >60)
Glucose, Bld: 107 mg/dL — ABNORMAL HIGH (ref 70–99)
Potassium: 3.7 mmol/L (ref 3.5–5.1)
Sodium: 136 mmol/L (ref 135–145)

## 2022-04-05 LAB — CBC
HCT: 32 % — ABNORMAL LOW (ref 36.0–46.0)
Hemoglobin: 10.5 g/dL — ABNORMAL LOW (ref 12.0–15.0)
MCH: 32 pg (ref 26.0–34.0)
MCHC: 32.8 g/dL (ref 30.0–36.0)
MCV: 97.6 fL (ref 80.0–100.0)
Platelets: 149 10*3/uL — ABNORMAL LOW (ref 150–400)
RBC: 3.28 MIL/uL — ABNORMAL LOW (ref 3.87–5.11)
RDW: 14.1 % (ref 11.5–15.5)
WBC: 3.2 10*3/uL — ABNORMAL LOW (ref 4.0–10.5)
nRBC: 0 % (ref 0.0–0.2)

## 2022-04-05 MED ORDER — FUROSEMIDE 10 MG/ML IJ SOLN
20.0000 mg | Freq: Once | INTRAMUSCULAR | Status: AC
  Administered 2022-04-05: 20 mg via INTRAVENOUS
  Filled 2022-04-05: qty 4

## 2022-04-05 MED ORDER — DILTIAZEM HCL ER COATED BEADS 120 MG PO CP24
240.0000 mg | ORAL_CAPSULE | Freq: Every day | ORAL | Status: DC
  Administered 2022-04-05 – 2022-04-07 (×3): 240 mg via ORAL
  Filled 2022-04-05 (×2): qty 2
  Filled 2022-04-05: qty 1
  Filled 2022-04-05: qty 2

## 2022-04-05 MED ORDER — APIXABAN 5 MG PO TABS
5.0000 mg | ORAL_TABLET | Freq: Two times a day (BID) | ORAL | Status: DC
  Administered 2022-04-05 – 2022-04-07 (×5): 5 mg via ORAL
  Filled 2022-04-05 (×5): qty 1

## 2022-04-05 NOTE — Progress Notes (Addendum)
PROGRESS NOTE    Vanessa Farmer  XMI:[id number] DOB: [birth year] DOA: 04/04/2022 PCP: Glean Hess, MD     Brief Narrative:   From admission h and p  Vanessa Farmer is a 86 y.o. female with medical history significant of HFpEF, DVT on Eliquis, hypertension, hyperlipidemia, CAD, depression, who presents to the ED with complaints of shortness of breath and palpitations.   Vanessa Farmer states that she went to visit her sons in [place] 2 weeks ago and while there she lost all of her home medications.  Due to this, she has not been taking any medications.  In this time period, she has been experiencing dyspnea on exertion that has been intermittent only.  Today, she awoke with significant worsening in her breathing.  She felt that she cannot catch her breath at all and her heart was pounding very quickly.  She denies any chest pain at that time.  She denies any other symptoms at this time including fever, chills, N/V/D, abdominal pain, urinary or bowel changes.  She denies any lower extremity edema.  Assessment & Plan:   Principal Problem:   Atrial fibrillation with RVR (HCC) Active Problems:   Acute CHF (congestive heart failure) (HCC)   Pancytopenia (HCC)   CAD (coronary artery disease)   Essential hypertension   CKD (chronic kidney disease) stage 3, GFR 30-59 ml/min (HCC)   History of DVT of lower extremity   (HFpEF) heart failure with preserved ejection fraction ([place])  # A-fib with RVR Symptomatic. RVR resolved with starting dilt. Had normal EF on TTE August of this year with dilated left atrium. Already on apixaban. Tsh wnl. Very short of breath and unable to get out of bed when ambulated with nurse tech today. Chads2vasc is 6. Small effusions on CTA which shows no PE, no sig lower extremity edema. All this in setting of a couple weeks without home meds. - consolidate dilt to 240 qd - continue apixaban, would continue at 5 bid given hx dvt and ongoing risk factors for dvt -  would involve cardiology if additional problems w/ hr control - gentle diuresis with lasix 20 iv once today, consider repeating - tele  # Debility Very sob when RN attempted to mobilize today, couldn't make it out of bed - PT/OT consults  # CAD Asymptomatic, troponins mildly elevated and flat suspect demand, no signs ischemia on EKG - cont apixaban, atorvastatin.  # HTN Here bp mildly elevated - hold home lisinopril and amlodipine - diltiazem and lasix as above  # Pancytopenia Chronic, stable - outpt f/u    DVT prophylaxis: apixaban Code Status: full Family Communication: none at bedside. No answer when daughter telephoned.  Level of care: Telemetry Medical Status is: Observation    Consultants:  none  Procedures: none  Antimicrobials:  none    Subjective: No sob or chest pain in bed  Objective: Vitals:   04/05/22 0530 04/05/22 0539 04/05/22 0600 04/05/22 0800  BP: (!) 117/54  135/61 (!) 142/58  Pulse: (!) 59  69 64  Resp: '18  11 14  '$ Temp:  98.4 F (36.9 C)  97.6 F (36.4 C)  TempSrc:  Oral  Oral  SpO2: 96%  95% 97%  Weight:      Height:        Intake/Output Summary (Last 24 hours) at 04/05/2022 0942 Last data filed at 04/04/2022 1147 Gross per 24 hour  Intake 700 ml  Output --  Net 700 ml   [employer]  04/04/22 1150  Weight: 47.6 kg    Examination:  General exam: Appears calm and comfortable  Respiratory system: rales at b Cardiovascular system: S1 & S2 heard, RRR. No JVD, murmurs, rubs, gallops or clicks. No pedal edema. Gastrointestinal system: Abdomen is nondistended, soft and nontender. No organomegaly or masses felt. Normal bowel sounds heard. Central nervous system: Alert and oriented. No focal neurological deficits. Extremities: Symmetric 5 x 5 power. Skin: No rashes, lesions or ulcers Psychiatry: Judgement and insight appear normal. Mood & affect appropriate.     Data Reviewed: I have personally reviewed following labs  and imaging studies  CBC: Recent Labs  Lab 04/04/22 1151 04/05/22 0342  WBC 3.3* 3.2*  HGB 11.4* 10.5*  HCT 34.4* 32.0*  MCV 98.0 97.6  PLT 144* 749*   Basic Metabolic Panel: Recent Labs  Lab 04/04/22 1151 04/05/22 0342  NA 138 136  K 3.5 3.7  CL 104 100  CO2 26 31  GLUCOSE 132* 107*  BUN 11 17  CREATININE 0.99 1.20*  CALCIUM 8.5* 8.6*  MG 2.1  --    GFR: Estimated Creatinine Clearance: 25.1 mL/min (A) (by C-G formula based on SCr of 1.2 mg/dL (H)). Liver Function Tests: No results for input(s): "AST", "ALT", "ALKPHOS", "BILITOT", "PROT", "ALBUMIN" in the last 168 hours. No results for input(s): "LIPASE", "AMYLASE" in the last 168 hours. No results for input(s): "AMMONIA" in the last 168 hours. Coagulation Profile: No results for input(s): "INR", "PROTIME" in the last 168 hours. Cardiac Enzymes: No results for input(s): "CKTOTAL", "CKMB", "CKMBINDEX", "TROPONINI" in the last 168 hours. BNP (last 3 results) No results for input(s): "PROBNP" in the last 8760 hours. HbA1C: No results for input(s): "HGBA1C" in the last 72 hours. CBG: No results for input(s): "GLUCAP" in the last 168 hours. Lipid Profile: No results for input(s): "CHOL", "HDL", "LDLCALC", "TRIG", "CHOLHDL", "LDLDIRECT" in the last 72 hours. Thyroid Function Tests: Recent Labs    04/04/22 1159  TSH 1.377   Anemia Panel: No results for input(s): "VITAMINB12", "FOLATE", "FERRITIN", "TIBC", "IRON", "RETICCTPCT" in the last 72 hours. Urine analysis:    Component Value Date/Time   COLORURINE YELLOW (A) 12/30/2021 2245   APPEARANCEUR CLEAR (A) 12/30/2021 2245   LABSPEC 1.008 12/30/2021 2245   PHURINE 7.0 12/30/2021 2245   GLUCOSEU NEGATIVE 12/30/2021 2245   HGBUR NEGATIVE 12/30/2021 2245   BILIRUBINUR NEGATIVE 12/30/2021 2245   BILIRUBINUR Negative 10/17/2020 0909   KETONESUR NEGATIVE 12/30/2021 2245   PROTEINUR NEGATIVE 12/30/2021 2245   UROBILINOGEN 0.2 10/17/2020 0909   NITRITE NEGATIVE  12/30/2021 2245   LEUKOCYTESUR MODERATE (A) 12/30/2021 2245   Sepsis Labs: '@LABRCNTIP'$ (procalcitonin:4,lacticidven:4)  ) Recent Results (from the past 240 hour(s))  Resp Panel by RT-PCR (Flu A&B, Covid) Anterior Nasal Swab     Status: None   Collection Time: 04/04/22 11:56 AM   Specimen: Anterior Nasal Swab  Result Value Ref Range Status   SARS Coronavirus 2 by RT PCR NEGATIVE NEGATIVE Final    Comment: (NOTE) SARS-CoV-2 target nucleic acids are NOT DETECTED.  The SARS-CoV-2 RNA is generally detectable in upper respiratory specimens during the acute phase of infection. The lowest concentration of SARS-CoV-2 viral copies this assay can detect is 138 copies/mL. A negative result does not preclude SARS-Cov-2 infection and should not be used as the sole basis for treatment or other patient management decisions. A negative result may occur with  improper specimen collection/handling, submission of specimen other than nasopharyngeal swab, presence of viral mutation(s) within the areas targeted  by this assay, and inadequate number of viral copies(<138 copies/mL). A negative result must be combined with clinical observations, patient history, and epidemiological information. The expected result is Negative.  Fact Sheet for Patients:  EntrepreneurPulse.com.au  Fact Sheet for Healthcare Providers:  IncredibleEmployment.be  This test is no t yet approved or cleared by the [place] FDA and  has been authorized for detection and/or diagnosis of SARS-CoV-2 by FDA under an Emergency Use Authorization (EUA). This EUA will remain  in effect (meaning this test can be used) for the duration of the COVID-19 declaration under Section 564(b)(1) of the Act, 21 U.S.C.section 360bbb-3(b)(1), unless the authorization is terminated  or revoked sooner.       Influenza A by PCR NEGATIVE NEGATIVE Final   Influenza B by PCR NEGATIVE NEGATIVE Final    Comment:  (NOTE) The Xpert Xpress SARS-CoV-2/FLU/RSV plus assay is intended as an aid in the diagnosis of influenza from Nasopharyngeal swab specimens and should not be used as a sole basis for treatment. Nasal washings and aspirates are unacceptable for Xpert Xpress SARS-CoV-2/FLU/RSV testing.  Fact Sheet for Patients: EntrepreneurPulse.com.au  Fact Sheet for Healthcare Providers: IncredibleEmployment.be  This test is not yet approved or cleared by the [place] FDA and has been authorized for detection and/or diagnosis of SARS-CoV-2 by FDA under an Emergency Use Authorization (EUA). This EUA will remain in effect (meaning this test can be used) for the duration of the COVID-19 declaration under Section 564(b)(1) of the Act, 21 U.S.C. section 360bbb-3(b)(1), unless the authorization is terminated or revoked.  Performed at [facility], [address] [place], [place] [address]          Radiology Studies: CT Angio Chest PE W/Cm &/Or Wo Cm  Result Date: 04/04/2022 CLINICAL DATA:  86 year old female with shortness of breath. EXAM: CT ANGIOGRAPHY CHEST WITH CONTRAST TECHNIQUE: Multidetector CT imaging of the chest was performed using the standard protocol during bolus administration of intravenous contrast. Multiplanar CT image reconstructions and MIPs were obtained to evaluate the vascular anatomy. RADIATION DOSE REDUCTION: This exam was performed according to the departmental dose-optimization program which includes automated exposure control, adjustment of the mA and/or kV according to patient size and/or use of iterative reconstruction technique. CONTRAST:  6m OMNIPAQUE IOHEXOL 350 MG/ML SOLN COMPARISON:  12/31/2021 chest CT. FINDINGS: Cardiovascular: Majority of the study is technically satisfactory but during the initial run, IV tubing leaked and the lung bases were not imaged. An additional sequence was performed but slightly delayed  with less than optimal contrast opacification of the basilar pulmonary arteries. No pulmonary emboli are identified. Cardiomegaly and coronary artery and aortic atherosclerotic calcifications are again noted. There is no evidence of thoracic aortic aneurysm or pericardial effusion. Mediastinum/Nodes: No enlarged mediastinal, hilar, or axillary lymph nodes. Thyroid gland, trachea, and esophagus demonstrate no significant findings. A large hiatal hernia is again noted. Lungs/Pleura: There is no evidence of airspace disease, consolidation, mass, suspicious nodule or pneumothorax. Trace bilateral pleural effusions are noted, decreased on the RIGHT. Scattered areas of scarring within both lungs again identified. Upper Abdomen: No acute abnormality. Musculoskeletal: No acute or suspicious bony abnormalities are noted. Remote bilateral rib fractures again identified. Review of the MIP images confirms the above findings. IMPRESSION: 1. No evidence of pulmonary emboli or thoracic aortic aneurysm. 2. Trace bilateral pleural effusions, decreased on the RIGHT. 3. Cardiomegaly and coronary artery disease. Aortic Atherosclerosis (ICD10-I70.0). 4. Large hiatal hernia. Electronically Signed   By: JMargarette CanadaM.D.   On:  04/04/2022 15:07   DG Chest 2 View  Result Date: 04/04/2022 CLINICAL DATA:  Shortness of breath. EXAM: CHEST - 2 VIEW COMPARISON:  08/30/2021 FINDINGS: Stable cardiomediastinal contours. Aortic atherosclerotic calcifications. Large hiatal hernia is again noted. Blunting of the right posterior costophrenic angle is best seen on the lateral projection radiograph and is concerning for a small effusion. Pulmonary vascular congestion noted without frank edema. No airspace opacities. Healing right lateral rib fractures are again seen. IMPRESSION: 1. Suspect small right pleural effusion. 2. Pulmonary vascular congestion. 3. Healing right lateral rib fractures. Electronically Signed   By: Kerby Moors M.D.   On:  04/04/2022 12:19        Scheduled Meds:  apixaban  2.5 mg Oral BID   atorvastatin  40 mg Oral Daily   diltiazem  30 mg Oral Q6H   furosemide  20 mg Oral QODAY   lisinopril  20 mg Oral Daily   multivitamin with minerals  1 tablet Oral Daily   pantoprazole  40 mg Oral Daily   sodium chloride flush  3 mL Intravenous Q12H   Continuous Infusions:   LOS: 0 days     Desma Maxim, MD [facility]   If 7PM-7AM, please contact night-coverage [email or web address] Password [facility] 04/05/2022, 9:42 AM

## 2022-04-05 NOTE — ED Notes (Signed)
Pt aware she is being moved in ED for observation. She is alert and oriented at this time with no complaints. Vitals stable on RA. Pt in NSR on the cardiac monitor. She has been diuresing post lasix with a total output of 658m since 0700.

## 2022-04-05 NOTE — Evaluation (Signed)
Occupational Therapy Evaluation Patient Details Name: Vanessa Farmer MRN: [id number] DOB: 03-Jun-1935 Today's Date: 04/05/2022   History of Present Illness 86 y.o. female with medical history significant for a fib, GERD, hypertension, dyslipidemia, coronary artery disease, and depression, presented to the ER with acute shortness of breath in the setting of not taking medications for 1 week.  Patient admitted for treatment of atrial fibrillation.   Clinical Impression   Vanessa Farmer presents to OT with low endurance and generalized weakness that impacts her ability to safely and independently complete ADLs. Prior to admission, patient lived with her daughter and was able to complete ADLs with modified independence using cane.  Her daughter provided assistance for cooking and transportation.  Currently, patient unable to participate in OOB assessment due to reports of headache and shortness of breath.  Patient refused mobility during assessment and nursing endorsed similar report.  OT provided education re: importance of OOB mobility to support further assessment, as well as strength and independence in self care tasks.  Vanessa Farmer will likely continue to benefit from skilled OT services in acute setting to support functional strengthening, endurance, safety and independence in ADLs.  Recommend HHOT upon discharge, though anticipate this recommendation could change if patient demonstrates expected improvement with a fib stabilization, or patient's family confirms necessary level of support at home.        Recommendations for follow up therapy are one component of a multi-disciplinary discharge planning process, led by the attending physician.  Recommendations may be updated based on patient status, additional functional criteria and insurance authorization.   Follow Up Recommendations  Skilled nursing-short term rehab (<3 hours/day)     Assistance Recommended at Discharge Intermittent  Supervision/Assistance  Patient can return home with the following A little help with walking and/or transfers;A little help with bathing/dressing/bathroom;Assistance with cooking/housework;Assist for transportation;Help with stairs or ramp for entrance    Functional Status Assessment  Patient has had a recent decline in their functional status and demonstrates the ability to make significant improvements in function in a reasonable and predictable amount of time.  Equipment Recommendations  BSC/3in1    Recommendations for Other Services       Precautions / Restrictions Precautions Precautions: Fall Restrictions Weight Bearing Restrictions: No      Mobility Bed Mobility Overal bed mobility: Needs Assistance               Patient Response: Anxious  Transfers Overall transfer level: Needs assistance                 General transfer comment: Patient refused, stating she gets headache and SOB with all movement.  Nursing endorsed difficulty with OOB mobility.      Balance Overall balance assessment: Needs assistance                                         ADL either performed or assessed with clinical judgement   ADL Overall ADL's : Needs assistance/impaired                                       General ADL Comments: Patient refused OOB mobility.  Able to complete UB ADLs with grossly setup assist, anticipate increased assist needed for lower body dressing and bathing, as well as ADLS involving functional mobility.  Vision Baseline Vision/History: 1 Wears glasses Patient Visual Report: No change from baseline       Perception     Praxis      Pertinent Vitals/Pain Pain Assessment Pain Assessment: No/denies pain     Hand Dominance Right   Extremity/Trunk Assessment Upper Extremity Assessment Upper Extremity Assessment: Generalized weakness   Lower Extremity Assessment Lower Extremity Assessment: Defer to PT  evaluation       Communication Communication Communication: No difficulties   Cognition Arousal/Alertness: Awake/alert Behavior During Therapy: WFL for tasks assessed/performed Overall Cognitive Status: Within Functional Limits for tasks assessed                                       General Comments       Exercises Other Exercises Other Exercises: provided education re: OT role and plan of care, discharge recommendations, importance of OOB mobility on independence and wellbeing   Shoulder Instructions      Home Living Family/patient expects to be discharged to:: Private residence Living Arrangements: Children Available Help at Discharge: Family;Available 24 hours/day Type of Home: Mobile home Home Access: Stairs to enter Entrance Stairs-Number of Steps: 2-3 Entrance Stairs-Rails: Right;Left Home Layout: One level     Bathroom Shower/Tub: Sponge bathes at baseline   [employer]: Standard     Home Equipment: Merchant navy officer (4 wheels)          Prior Functioning/Environment Prior Level of Function : Independent/Modified Independent             Mobility Comments: Reports mod I with quad cane/rollator.  Denies recent falls. ADLs Comments: Patient able to complete ADLs and medication management with mod I.  Her daughter provides assistance for cooking and transportation.        OT Problem List: Decreased strength;Decreased activity tolerance;Cardiopulmonary status limiting activity      OT Treatment/Interventions: Self-care/ADL training;Therapeutic exercise;Energy conservation;DME and/or AE instruction;Therapeutic activities;Patient/family education;Balance training    OT Goals(Current goals can be found in the care plan section) Acute Rehab OT Goals Patient Stated Goal: to return home OT Goal Formulation: With patient Time For Goal Achievement: 04/19/22 Potential to Achieve Goals: Good  OT Frequency: Min 2X/week     Co-evaluation              AM-PAC OT "6 Clicks" Daily Activity     Outcome Measure Help from another person eating meals?: None Help from another person taking care of personal grooming?: A Little Help from another person toileting, which includes using toliet, bedpan, or urinal?: A Lot Help from another person bathing (including washing, rinsing, drying)?: A Little Help from another person to put on and taking off regular upper body clothing?: A Little Help from another person to put on and taking off regular lower body clothing?: A Lot 6 Click Score: 17   End of Session    Activity Tolerance: Patient limited by fatigue Patient left: in bed;with call bell/phone within reach  OT Visit Diagnosis: Unsteadiness on feet (R26.81);Muscle weakness (generalized) (M62.81)                Time: 1610-9604 OT Time Calculation (min): 12 min Charges:  OT General Charges $OT Visit: 1 Visit OT Evaluation $OT Eval Moderate Complexity: 1 Mod  Talvin Christianson, OTR/L 04/05/22, 1:14 PM

## 2022-04-05 NOTE — ED Notes (Signed)
Report given to Ashley, RN

## 2022-04-05 NOTE — Progress Notes (Signed)
PT Cancellation Note  Patient Details Name: Vanessa Farmer MRN: [id number] DOB: 27-Apr-1936   Cancelled Treatment:    Reason Eval/Treat Not Completed: Fatigue/lethargy limiting ability to participate;Patient declined, no reason specified;Patient not medically ready (Chart reviewed, RN consulted. Per OT assessment pt does not feel able to participate in OOB assessment of mobility as she still feels quite poorly. Pt only recently arrive to ED previous day.) Will continue to follow and attempt evaluation again at later date/time.   2:47 PM, 04/05/22 Etta Grandchild, PT, DPT Physical Therapist - [facility] ([place])  [phone] ([place])    Lane C 04/05/2022, 2:46 PM

## 2022-04-05 NOTE — Progress Notes (Signed)
ANTICOAGULATION CONSULT NOTE - Initial Consult  Pharmacy Consult for apixaban Indication:  hx DVT-on apixaban PTA, new onset Afib  No Known Allergies  Patient Measurements: Height: 5' (152.4 cm) Weight: 47.6 kg (105 lb) IBW/kg (Calculated) : 45.5 Heparin Dosing Weight:    Vital Signs: Temp: 97.6 F (36.4 C) (11/27 0800) Temp Source: Oral (11/27 0800) BP: 142/58 (11/27 0800) Pulse Rate: 64 (11/27 0800)  Labs: Recent Labs    04/04/22 1151 04/04/22 1353 04/05/22 0342  HGB 11.4*  --  10.5*  HCT 34.4*  --  32.0*  PLT 144*  --  149*  CREATININE 0.99  --  1.20*  TROPONINIHS 16 21*  --     Estimated Creatinine Clearance: 25.1 mL/min (A) (by C-G formula based on SCr of 1.2 mg/dL (H)).   Medical History: Past Medical History:  Diagnosis Date   Anemia    Cataract    Depression    Fall    GERD (gastroesophageal reflux disease)    Hyperlipidemia    Hypertension    Myocardial infarction ([place])    "mild" many yrs ago    Medications:  (Not in a hospital admission)  Scheduled:   apixaban  5 mg Oral BID   atorvastatin  40 mg Oral Daily   diltiazem  240 mg Oral Daily   furosemide  20 mg Intravenous Once   multivitamin with minerals  1 tablet Oral Daily   pantoprazole  40 mg Oral Daily   sodium chloride flush  3 mL Intravenous Q12H    Assessment: 86 yo F w/ hx DVT on apixaban PTA now with new Afib  Goal of Therapy:  Monitor platelets by anticoagulation protocol: Yes   Plan:  Will adjust current order for apixaban from 2.5 mg bid to 5 mg BID for continued DVT treatment in addition to Afib. Consider assess for reduced-intensity dosing for prophylaxis against venous thromboembolism recurrence if appropriate. -CBC per protocol  Daron Stutz A 04/05/2022,10:11 AM

## 2022-04-06 DIAGNOSIS — I4891 Unspecified atrial fibrillation: Secondary | ICD-10-CM | POA: Diagnosis not present

## 2022-04-06 DIAGNOSIS — D61818 Other pancytopenia: Secondary | ICD-10-CM | POA: Diagnosis not present

## 2022-04-06 DIAGNOSIS — I5033 Acute on chronic diastolic (congestive) heart failure: Secondary | ICD-10-CM

## 2022-04-06 DIAGNOSIS — N1831 Chronic kidney disease, stage 3a: Secondary | ICD-10-CM | POA: Diagnosis not present

## 2022-04-06 LAB — CBC
HCT: 32.3 % — ABNORMAL LOW (ref 36.0–46.0)
Hemoglobin: 10.8 g/dL — ABNORMAL LOW (ref 12.0–15.0)
MCH: 32.6 pg (ref 26.0–34.0)
MCHC: 33.4 g/dL (ref 30.0–36.0)
MCV: 97.6 fL (ref 80.0–100.0)
Platelets: 157 10*3/uL (ref 150–400)
RBC: 3.31 MIL/uL — ABNORMAL LOW (ref 3.87–5.11)
RDW: 13.9 % (ref 11.5–15.5)
WBC: 3.6 10*3/uL — ABNORMAL LOW (ref 4.0–10.5)
nRBC: 0 % (ref 0.0–0.2)

## 2022-04-06 LAB — BASIC METABOLIC PANEL
Anion gap: 6 (ref 5–15)
BUN: 18 mg/dL (ref 8–23)
CO2: 30 mmol/L (ref 22–32)
Calcium: 8.5 mg/dL — ABNORMAL LOW (ref 8.9–10.3)
Chloride: 100 mmol/L (ref 98–111)
Creatinine, Ser: 1.09 mg/dL — ABNORMAL HIGH (ref 0.44–1.00)
GFR, Estimated: 50 mL/min — ABNORMAL LOW (ref >60)
Glucose, Bld: 109 mg/dL — ABNORMAL HIGH (ref 70–99)
Potassium: 3.8 mmol/L (ref 3.5–5.1)
Sodium: 136 mmol/L (ref 135–145)

## 2022-04-06 MED ORDER — FUROSEMIDE 10 MG/ML IJ SOLN
20.0000 mg | Freq: Once | INTRAMUSCULAR | Status: AC
  Administered 2022-04-06: 20 mg via INTRAVENOUS
  Filled 2022-04-06: qty 4

## 2022-04-06 NOTE — Telephone Encounter (Signed)
Requested medication (s) are due for refill today: yes  Requested medication (s) are on the active medication list: yes  Last refill:  01/19/22  #90  Future visit scheduled: yes  Notes to clinic:  overdue lab work- pt is in ED   Requested Prescriptions  Pending Prescriptions Disp Refills   atorvastatin (LIPITOR) 40 MG tablet [Pharmacy Med Name: ATORVASTATIN '40MG'$  TABLETS] 90 tablet     Sig: TAKE 1 TABLET(40 MG) BY MOUTH DAILY     Cardiovascular:  Antilipid - Statins Failed - 04/03/2022  4:37 PM      Failed - Lipid Panel in normal range within the last 12 months    Cholesterol, Total  Date Value Ref Range Status  10/17/2020 162 100 - 199 mg/dL Final   LDL Chol Calc (NIH)  Date Value Ref Range Status  10/17/2020 80 0 - 99 mg/dL Final   HDL  Date Value Ref Range Status  10/17/2020 71 >39 mg/dL Final   Triglycerides  Date Value Ref Range Status  10/17/2020 52 0 - 149 mg/dL Final         Passed - Patient is not pregnant      Passed - Valid encounter within last 12 months    Recent Outpatient Visits           1 month ago Essential hypertension   [place] [facility] at [facility], Jesse Sans, MD   2 months ago Acute deep vein thrombosis (DVT) of both peroneal veins [facility])   [place] [facility] at [facility], Jesse Sans, MD   1 year ago Essential hypertension   [place] [facility] Medicine at [facility], Jesse Sans, MD   1 year ago Essential hypertension   [place] [facility] Medicine at [facility], Jesse Sans, MD   2 years ago Essential hypertension   [place] [facility] at [facility], Jesse Sans, MD       Future Appointments             In 1 month [place], Barbaraann Faster, NP [facility], [facility]   In 4 months [place] Melia, Jesse Sans, MD [facility] and [facility] Medicine at [facility], [facility]             Signed Prescriptions Disp Refills   lisinopril (ZESTRIL) 20 MG tablet 90 tablet 1    Sig: TAKE 1 TABLET(20 MG) BY MOUTH DAILY     Cardiovascular:  ACE Inhibitors Failed - 04/03/2022  4:37 PM      Failed - Cr in normal range and within 180 days    Creatinine, Ser  Date Value Ref Range Status  04/06/2022 1.09 (H) 0.44 - 1.00 mg/dL Final         Passed - K in normal range and within 180 days    Potassium  Date Value Ref Range Status  04/06/2022 3.8 3.5 - 5.1 mmol/L Final         Passed - Patient is not pregnant      Passed - Last BP in normal range    BP Readings from Last 1 Encounters:  04/06/22 (!) 145/87         Passed - Valid encounter within last 6 months    Recent Outpatient Visits           1 month ago Essential hypertension   [place]  Primary Care and Sports Medicine at [facility], Jesse Sans, MD   2 months ago Acute deep vein thrombosis (DVT) of both peroneal veins [facility])   [place] [facility] Medicine at [facility], Jesse Sans, MD   1 year ago Essential hypertension   [place] [facility] at [facility], Jesse Sans, MD   1 year ago Essential hypertension   [place] [facility] at [facility], Jesse Sans, MD   2 years ago Essential hypertension   [place] [facility] Medicine at [facility], Jesse Sans, MD       Future Appointments             In 1 month [place], Barbaraann Faster, NP [facility], [place]   In 4 months Army Melia, Jesse Sans, MD [facility] Primary [facility] and Sports Medicine at [place], [facility]             amLODipine (NORVASC) 10 MG tablet 90 tablet 1    Sig: TAKE 1 TABLET(10 MG) BY MOUTH DAILY     Cardiovascular: Calcium Channel Blockers 2 Passed - 04/03/2022  4:37 PM      Passed - Last BP in normal range    BP Readings from Last 1 Encounters:  04/06/22 (!) 145/87         Passed  - Last Heart Rate in normal range    Pulse Readings from Last 1 Encounters:  04/06/22 84         Passed - Valid encounter within last 6 months    Recent Outpatient Visits           1 month ago Essential hypertension   [place] [facility] at [facility], Jesse Sans, MD   2 months ago Acute deep vein thrombosis (DVT) of both peroneal veins [facility])   [place] [facility] at [facility], Jesse Sans, MD   1 year ago Essential hypertension   [place] [facility] at [facility], Jesse Sans, MD   1 year ago Essential hypertension    Primary Care and Sports Medicine at [facility], Jesse Sans, MD   2 years ago Essential hypertension   [place] at [facility], Jesse Sans, MD       Future Appointments             In 1 month [place], Barbaraann Faster, NP [facility], [place]   In 4 months Army Melia, Jesse Sans, MD [place] and Sports Medicine at [facility], [facility]

## 2022-04-06 NOTE — Hospital Course (Signed)
86 year old female with past medical history of diastolic heart failure, DVT on Eliquis and hypertension presented to the emergency room on 11/26 with complaints of shortness of breath and palpitations and found to be in new onset atrial fibrillation with rapid ventricular rate and acute diastolic heart failure.  Patient started on Cardizem drip and IV Lasix.

## 2022-04-06 NOTE — Evaluation (Signed)
Physical Therapy Evaluation Patient Details Name: Vanessa Farmer MRN: [id number] DOB: February 28, 1936 Today's Date: 04/06/2022  History of Present Illness  Vanessa Farmer is an 60yoF who comes [facility] 04[facility]22 c acute onset SOB. Pt lost her meds while traveling 10 days prior. PMH: HLD, HTN, MI, DVT on eliquis. Pt reports orthopnea, LEE, noted here to be tachycardic in 150s.  Clinical Impression  Pt reports to feel much closer to baseline with exception to frontal HA all day.  No dyspnea at this time. [facility]. Will recommend HHPT despite pt reluctance- it has been instrumental in rehabilitation after last admission and they can do a more exhaustive assessment of her strength and mobility in a meaningful environment.      Recommendations for follow up therapy are one component of a multi-disciplinary discharge planning process, led by the attending physician.  Recommendations may be updated based on patient status, additional functional criteria and insurance authorization.  Follow Up Recommendations Home health PT      Assistance Recommended at Discharge Set up Supervision/Assistance  Patient can return home with the following  A little help with walking and/or transfers;A little help with bathing/dressing/bathroom;Assist for transportation;Help with stairs or ramp for entrance    Equipment Recommendations None recommended by PT  Recommendations for Other Services       Functional Status Assessment Patient has had a recent decline in their functional status and demonstrates the ability to make significant improvements in function in a reasonable and predictable amount of time.     Precautions / Restrictions Precautions Precautions: Fall Restrictions Weight Bearing Restrictions: No      Mobility  Bed Mobility Overal bed mobility: Modified Independent                  Transfers   Equipment used: Straight  cane Transfers: Sit to/from Stand Sit to Stand: Supervision                Ambulation/Gait Ambulation/Gait assistance: Min guard, Supervision Gait Distance (Feet): 240 Feet Assistive device: Straight cane Gait Pattern/deviations: WFL(Within Functional Limits)   Gait velocity interpretation: <1.31 ft/sec, indicative of household ambulator   General Gait Details: 2-point gait with SPC RUE, excellent sequencing; appears fatigued by end and left degernative knee appears to be getting weaker  SFinancial traderRankin (Stroke Patients Only)       Balance Overall balance assessment: Modified Independent, Mild deficits observed, not formally tested                                           Pertinent Vitals/Pain Pain Assessment Pain Assessment: 0-10 Pain Score: 6  Pain Location: bifrontal HA Pain Descriptors / Indicators: Aching Pain Intervention(s): Limited activity within patient's tolerance, Monitored during session    Home Living Family/patient expects to be discharged to:: Private residence Living Arrangements: Children Available Help at Discharge: Family;Available 24 hours/day Type of Home: Mobile home Home Access: Stairs to enter Entrance Stairs-Rails: Right;Left Entrance Stairs-Number of Steps: 2-3   Home Layout: One level Home Equipment: CMerchant navy officer(4 wheels);Rolling Walker (2 wheels)      Prior Function Prior Level of Function : Independent/Modified Independent  Mobility Comments: Reports mod I with quad cane/rollator.  Denies recent falls. ADLs Comments: Patient able to complete ADLs and medication management with mod I.  Her daughter provides assistance for cooking and transportation.     Hand Dominance   Dominant Hand: Right    Extremity/Trunk Assessment   Upper Extremity Assessment Upper Extremity Assessment: Overall WFL for tasks assessed    Lower Extremity  Assessment Lower Extremity Assessment: Overall WFL for tasks assessed    Cervical / Trunk Assessment Cervical / Trunk Assessment:  (chronic degernerative scoliosis and kyphosis is evident)  Communication   Communication: No difficulties  Cognition Arousal/Alertness: Awake/alert Behavior During Therapy: WFL for tasks assessed/performed Overall Cognitive Status: Within Functional Limits for tasks assessed                                          General Comments      Exercises     Assessment/Plan    PT Assessment Patient needs continued PT services  PT Problem List Decreased activity tolerance;Decreased balance;Decreased mobility;Pain       PT Treatment Interventions Gait training;Stair training;Functional mobility training;Therapeutic activities;Therapeutic exercise;Balance training;Patient/family education    PT Goals (Current goals can be found in the Care Plan section)  Acute Rehab PT Goals Patient Stated Goal: return to home and stay active PT Goal Formulation: With patient Time For Goal Achievement: 04/20/22 Potential to Achieve Goals: Good    Frequency Min 2X/week     Co-evaluation               AM-PAC PT "6 Clicks" Mobility  Outcome Measure Help needed turning from your back to your side while in a flat bed without using bedrails?: A Little Help needed moving from lying on your back to sitting on the side of a flat bed without using bedrails?: A Little Help needed moving to and from a bed to a chair (including a wheelchair)?: A Little Help needed standing up from a chair using your arms (e.g., wheelchair or bedside chair)?: A Little Help needed to walk in hospital room?: A Little Help needed climbing 3-5 steps with a railing? : A Little 6 Click Score: 18    End of Session Equipment Utilized During Treatment: Gait belt Activity Tolerance: Patient tolerated treatment well;No increased pain Patient left: in bed;with call bell/phone  within reach;with bed alarm set Nurse Communication: Mobility status PT Visit Diagnosis: Other abnormalities of gait and mobility (R26.89);Difficulty in walking, not elsewhere classified (R26.2)    Time: 1500-1520 PT Time Calculation (min) (ACUTE ONLY): 20 min   Charges:   PT Evaluation $PT Eval Moderate Complexity: 1 Mod PT Treatments $Therapeutic Exercise: 8-22 mins       3:33 PM, 04/06/22 Etta Grandchild, PT, DPT Physical Therapist - [facility]  [phone] ([place])    Lyons C 04/06/2022, 3:31 PM

## 2022-04-06 NOTE — Consult Note (Addendum)
   Heart Failure Nurse Navigator Note  HFpEF 55 to 60%.  Moderate LVH.  Grade 1 diastolic dysfunction.  Moderate left atrial enlargement.  She presented to the emergency room with complaints of shortness of breath, palpitations and noted to be in atrial fibrillation with RVR heart rates in the 150s.  Blood pressure 144/93 patient had gone 2 weeks without her medications.  BNP was 953.  Chest x-ray revealed vascular congestion.  Comorbidities:  Anemia Depression GERD Hyperlipidemia Hypertension Atrial fibrillation Neri artery disease  Medications:  Amlodipine 10 mg daily Apixaban 5 mg twice a day Atorvastatin 40 mg daily Diltiazem 240 mg daily Zestril 20 mg daily  Labs:  Sodium 136, potassium 3.8, chloride 100, CO2 30, BUN 18, creatinine 1.09, estimated GFR 50 Weight documented at 47.6 kg Blood pressure 141/63  Initial meeting with patient on this admission.  There were no family members present.  She states that she had been to visit her sons in [place] and had her medications in a grocery bag and somehow they became lost.  She states that she did try to purchase more meds through her pharmacy but was told by insurance that it was too early to be refilling and she states that she did not have the money for pay for them at the time either.  She states that she lives with her daughter and her daughter is the one that is in charge of her meals.  She states that she does not like how her food tastes without salt, but explained the reasoning behind eating without salt and eating lower sodium foods she voices understanding.  She states that she has a scale but does not weigh herself on a daily basis.  Explained the rationale behind daily weights and what to report.  Also made aware that she has an appointment in the outpatient heart failure clinic on December 6 at 1:30 in the afternoon.  Will continue to follow along.  Pricilla Riffle RN [facility]FN

## 2022-04-06 NOTE — Progress Notes (Signed)
[facility] Progress Note  Patient: Vanessa Farmer    NWG:[id number]  [place]: 04/04/2022    Date of Service: the patient was seen and examined on 04/06/2022  Brief hospital course: 86 year old female with past medical history of diastolic heart failure, DVT on Eliquis and hypertension presented to the emergency room on 11/26 with complaints of shortness of breath and palpitations and found to be in new onset atrial fibrillation with rapid ventricular rate and acute diastolic heart failure.  Patient started on Cardizem drip and IV Lasix.   Assessment and Plan: # A-fib with RVR Symptomatic. RVR resolved with starting dilt. Had normal EF on TTE August of this year with dilated left atrium. Already on apixaban. Tsh wnl. . Chads2vasc is 6. Small effusions on CTA which shows no PE, no sig lower extremity edema. All this in setting of a couple weeks without home meds. (Had run out while visiting friend).  Tolerating changeover and Cardizem to p.o..  Diuresed over a liter with 1 dose of Lasix.  Will likely give 1 additional dose. - consolidate dilt to 240 qd - continue apixaban, would continue at 5 bid given hx dvt and ongoing risk factors for dvt - would involve cardiology if additional problems w/ hr control - gentle diuresis with lasix 20 iv once today, consider repeating - tele   # Debility Very sob when RN attempted to mobilize today, couldn't make it out of bed Physical and Occupational Therapy are recommending home health.  Did better today than yesterday with therapy.   # CAD Asymptomatic, troponins mildly elevated and flat suspect demand, no signs ischemia on EKG - cont apixaban, atorvastatin.   # HTN Here bp mildly elevated - hold home lisinopril and amlodipine, will restart today - diltiazem and lasix as above   # Pancytopenia Chronic, stable - outpt f/u  #Stage IIIa chronic kidney disease: Looks to be at baseline   Body mass index is 20.51 kg/m.         Consultants: None  Procedures: None  Antimicrobials: None  Code Status: Full code   Subjective: Resting comfortably  Objective: Mildly elevated blood pressures Vitals:   04/06/22 1108 04/06/22 1237  BP: (!) 152/86 (!) 141/63  Pulse: 85 84  Resp: 13 18  Temp: 98.8 F (37.1 C)   SpO2: 95% 95%    Intake/Output Summary (Last 24 hours) at 04/06/2022 1736 Last data filed at 04/06/2022 1000 Gross per 24 hour  Intake 3 ml  Output 1050 ml  Net -1047 ml   Filed Weights   04/04/22 1150  Weight: 47.6 kg   Body mass index is 20.51 kg/m.  Exam:  General: Somnolent, no acute distress HEENT: Normocephalic, atraumatic, mucous membranes slightly dry Cardiovascular: Irregular rhythm, rate controlled Respiratory: Clear to auscultation bilaterally Abdomen: Soft, nontender, nondistended, positive bowel sounds Musculoskeletal: No clubbing or cyanosis or edema Skin: No skin breaks, tears or lesions Psychiatry: [facility], no evidence of psychoses Neurology: No focal deficits  Data Reviewed: Noted improvement in creatinine  Disposition:  Status is: Inpatient Remains inpatient appropriate because:  -Further diuresis -Better blood pressure control    Anticipated discharge date: 11/29  Family Communication: Will call daughter DVT Prophylaxis:  apixaban Arne [place]) tablet 5 mg    Author: Annita Brod ,MD 04/06/2022 5:36 PM  To reach On-call, see care teams to locate the attending and reach out via [email or web address]. Between 7PM-7AM, please contact night-coverage If you still have difficulty reaching the attending provider, please page the [facility] (Director on Call)  for [facility] on amion for assistance.

## 2022-04-06 NOTE — Progress Notes (Signed)
Occupational Therapy Treatment Patient Details Name: Vanessa Farmer MRN: [id number] DOB: [birth year] Today's Date: 04/06/2022   History of present illness 86 y.o. female with medical history significant for a fib, GERD, hypertension, dyslipidemia, coronary artery disease, and depression, presented to the ER with acute shortness of breath in the setting of not taking medications for 1 week.  Patient admitted for treatment of atrial fibrillation.   OT comments  Vanessa Farmer was seen for OT treatment on this date. Upon arrival to room pt awake, semi-supine in bed. Pt reports feeling much better from previous date and agreeable to OOB activity. Pt agreeable to OT tx session and performs functional activity as described below (see ADL section for additional detail). Pt educated on falls prevention, safe use of RW for functional mobility (pt reports she typically uses a walking stick for functional mobility).  Pt making good progress toward goals and continues to benefit from skilled OT services to maximize return to PLOF and minimize risk of future falls, injury, caregiver burden, and readmission. Will continue to follow POC. Discharge recommendation remains appropriate.     Recommendations for follow up therapy are one component of a multi-disciplinary discharge planning process, led by the attending physician.  Recommendations may be updated based on patient status, additional functional criteria and insurance authorization.    Follow Up Recommendations  Home health OT     Assistance Recommended at Discharge Intermittent Supervision/Assistance  Patient can return home with the following  A little help with walking and/or transfers;A little help with bathing/dressing/bathroom;Assistance with cooking/housework;Assist for transportation;Help with stairs or ramp for entrance;Direct supervision/assist for medications management   Equipment Recommendations  BSC/3in1    Recommendations for Other Services       Precautions / Restrictions Precautions Precautions: Fall Restrictions Weight Bearing Restrictions: No       Mobility Bed Mobility Overal bed mobility: Needs Assistance                  Transfers Overall transfer level: Needs assistance Equipment used: Rolling walker (2 wheels) Transfers: Sit to/from Stand Sit to Stand: Min guard           General transfer comment: Pt performs STS and funcitonal mobility in room with assist as above.     Balance Overall balance assessment: Needs assistance Sitting-balance support: Feet supported, No upper extremity supported Sitting balance-Leahy Scale: Good     Standing balance support: During functional activity, Single extremity supported, No upper extremity supported Standing balance-Leahy Scale: Good Standing balance comment: Able to perform HH at sink with no UE support.                           ADL either performed or assessed with clinical judgement   ADL Overall ADL's : Needs assistance/impaired                                     Functional mobility during ADLs: Min guard;Rolling walker (2 wheels) General ADL Comments: Pt able to perform bed/functional mobility with MIN GUARD and assist for management of lines and leads. She performs standing grooming tasks at sink with SUPERVISION and min cues for activity pacing and PLB during session.    Extremity/Trunk Assessment Upper Extremity Assessment Upper Extremity Assessment: Generalized weakness   Lower Extremity Assessment Lower Extremity Assessment: Generalized weakness   Cervical / Trunk Assessment Cervical / Trunk Assessment:  Normal    Vision Baseline Vision/History: 1 Wears glasses Patient Visual Report: No change from baseline     Perception     Praxis      Cognition Arousal/Alertness: Awake/alert Behavior During Therapy: WFL for tasks assessed/performed Overall Cognitive Status: Within Functional Limits for tasks  assessed                                          Exercises Other Exercises Other Exercises: OT facilitated standing grooming, bed/functional mobility, and education on safe use of AE/DME for ADL management. See ADL section for additional detail.    Shoulder Instructions       General Comments      Pertinent Vitals/ Pain       Pain Assessment Pain Assessment: No/denies pain  Home Living                                          Prior Functioning/Environment              Frequency  Min 2X/week        Progress Toward Goals  OT Goals(current goals can now be found in the care plan section)  Progress towards OT goals: Progressing toward goals  Acute Rehab OT Goals Patient Stated Goal: To go home. OT Goal Formulation: With patient Time For Goal Achievement: 04/19/22 Potential to Achieve Goals: Good  Plan Discharge plan needs to be updated;Frequency remains appropriate    Co-evaluation                 AM-PAC OT "6 Clicks" Daily Activity     Outcome Measure   Help from another person eating meals?: None Help from another person taking care of personal grooming?: A Little Help from another person toileting, which includes using toliet, bedpan, or urinal?: A Little Help from another person bathing (including washing, rinsing, drying)?: A Little Help from another person to put on and taking off regular upper body clothing?: A Little Help from another person to put on and taking off regular lower body clothing?: A Little 6 Click Score: 19    End of Session Equipment Utilized During Treatment: Gait belt;Rolling walker (2 wheels)  OT Visit Diagnosis: Unsteadiness on feet (R26.81);Muscle weakness (generalized) (M62.81)   Activity Tolerance Patient tolerated treatment well   Patient Left in bed;with call bell/phone within reach;with bed alarm set   Nurse Communication Mobility status;Other (comment) (Pt c/o IV bleeding,  minimal blood noted around IV site.)        Time: 2979-8921 OT Time Calculation (min): 19 min  Charges: OT General Charges $OT Visit: 1 Visit OT Treatments $Self Care/Home Management : 8-22 mins  Shara Blazing, M.S., OTR/L 04/06/22, 3:11 PM

## 2022-04-07 DIAGNOSIS — D61818 Other pancytopenia: Secondary | ICD-10-CM | POA: Diagnosis not present

## 2022-04-07 DIAGNOSIS — I5033 Acute on chronic diastolic (congestive) heart failure: Secondary | ICD-10-CM | POA: Diagnosis not present

## 2022-04-07 DIAGNOSIS — I4891 Unspecified atrial fibrillation: Secondary | ICD-10-CM | POA: Diagnosis not present

## 2022-04-07 DIAGNOSIS — I10 Essential (primary) hypertension: Secondary | ICD-10-CM

## 2022-04-07 LAB — BASIC METABOLIC PANEL
Anion gap: 7 (ref 5–15)
BUN: 19 mg/dL (ref 8–23)
CO2: 30 mmol/L (ref 22–32)
Calcium: 9 mg/dL (ref 8.9–10.3)
Chloride: 98 mmol/L (ref 98–111)
Creatinine, Ser: 1.12 mg/dL — ABNORMAL HIGH (ref 0.44–1.00)
GFR, Estimated: 48 mL/min — ABNORMAL LOW (ref >60)
Glucose, Bld: 108 mg/dL — ABNORMAL HIGH (ref 70–99)
Potassium: 4.1 mmol/L (ref 3.5–5.1)
Sodium: 135 mmol/L (ref 135–145)

## 2022-04-07 LAB — CBC
HCT: 36.9 % (ref 36.0–46.0)
Hemoglobin: 12.5 g/dL (ref 12.0–15.0)
MCH: 32.7 pg (ref 26.0–34.0)
MCHC: 33.9 g/dL (ref 30.0–36.0)
MCV: 96.6 fL (ref 80.0–100.0)
Platelets: 182 10*3/uL (ref 150–400)
RBC: 3.82 MIL/uL — ABNORMAL LOW (ref 3.87–5.11)
RDW: 13.9 % (ref 11.5–15.5)
WBC: 4 10*3/uL (ref 4.0–10.5)
nRBC: 0 % (ref 0.0–0.2)

## 2022-04-07 MED ORDER — DILTIAZEM HCL ER COATED BEADS 240 MG PO CP24: 240.0000 mg | ORAL_CAPSULE | Freq: Every day | ORAL | 2 refills | Status: DC

## 2022-04-07 NOTE — TOC Initial Note (Signed)
Transition of Care [facility]) - Initial/Assessment Note    Patient Details  Name: Vanessa Farmer MRN: [id number] Date of Birth: 08-06-1935  Transition of Care [facility]) CM/SW Contact:    Quin Hoop, LCSW Phone Number: 04/07/2022, 9:37 AM  Clinical Narrative:                 CSW contacted patient daughter, [name], about pt discharge and OT/PT recs for home. [name] declined [facility] services and states that she takes care of her mom.  She will come and pick pt up this morning around 10 am.    Barriers to Discharge: No Barriers Identified   Patient Goals and CMS Choice Patient states their goals for this hospitalization and ongoing recovery are:: to go home      Expected Discharge Plan and Services:  Home with care provided by daughter, [name].  She lives with her daughter.  Ocie Bob, [place]                                                 Prior Living Arrangements/Services                       Activities of Daily Living Home Assistive Devices/Equipment: Kasandra Knudsen (specify quad or straight), Walker (specify type) ADL Screening (condition at time of admission) Patient's cognitive ability adequate to safely complete daily activities?: No Is the patient deaf or have difficulty hearing?: No Does the patient have difficulty seeing, even when wearing glasses/contacts?: No Does the patient have difficulty concentrating, remembering, or making decisions?: No Patient able to express need for assistance with ADLs?: Yes Does the patient have difficulty dressing or bathing?: No Independently performs ADLs?: Yes (appropriate for developmental age) Does the patient have difficulty walking or climbing stairs?: No Weakness of Legs: None Weakness of Arms/Hands: None  Permission Sought/Granted                  Emotional Assessment              Admission diagnosis:  Shortness of breath [R06.02] Acute pulmonary edema ([place]) [J81.0] Atrial fibrillation with  RVR ([place]) [I48.91] Depression, major, single episode, moderate (HCC) [F32.1] Gastroesophageal reflux disease, unspecified whether esophagitis present [K21.9] Patient Active Problem List   Diagnosis Date Noted   History of DVT of lower extremity 04/05/2022   (HFpEF) heart failure with preserved ejection fraction ([place]) 04/05/2022   Atrial fibrillation with RVR ([place]) 04/04/2022   Hypokalemia 12/31/2021   Acute on chronic diastolic (congestive) heart failure ([place]) 12/30/2021   Aortic atherosclerosis ([place]) 10/20/2020   Ganglion cyst of wrist, right 03/20/2019   Thrombocytopenia ([place]) 07/19/2018   Iron deficiency anemia    Polyp of sigmoid colon    Pancytopenia ([place]) 09/30/2017   GERD (gastroesophageal reflux disease) 06/10/2017   CKD (chronic kidney disease) stage 3, GFR 30-59 ml/min ([place]) 06/08/2016   Hyperlipidemia 06/08/2016   Scoliosis 06/07/2016   Osteoporosis 06/07/2016   Essential hypertension 06/07/2016   CAD (coronary artery disease) 06/07/2016   Depression, major, single episode, moderate ([place]) 06/07/2016   PCP:  Glean Hess, MD Pharmacy:   [facility] [phone] [address], [place] AT [facility] OF [address] [place] [address] Phone: [phone] Fax: [phone]     Social Determinants  of Health (SDOH) Interventions    Readmission Risk Interventions     No data to display

## 2022-04-07 NOTE — Progress Notes (Signed)
CSW reached out to pt's dtr, [name], to discuss recs for [facility] PT/OT. [name] states that she takes care of her mom and that she is declining the services.    Pt to d/c home today and dtr is headed up this way to pick her up.  [place], [place]

## 2022-04-07 NOTE — Discharge Summary (Signed)
Physician Discharge Summary   Patient: Vanessa Farmer MRN: [id number] DOB: 1936-03-07  Admit date:     04/04/2022  Discharge date: 04/07/22  Discharge Physician: Annita Brod   PCP: Glean Hess, MD   Recommendations at discharge:  + New medication: Cardizem CD 240 p.o. daily Patient will follow-up with PCP in the next 1 month  Discharge Diagnoses: Principal Problem:   Atrial fibrillation with RVR ([place]) Active Problems:   Acute on chronic diastolic (congestive) heart failure (HCC)   Pancytopenia (HCC)   Essential hypertension   CKD (chronic kidney disease) stage 3, GFR 30-59 ml/min (HCC)   CAD (coronary artery disease)   History of DVT of lower extremity  Resolved Problems:   * No resolved hospital problems. *  Hospital Course: 86 year old female with past medical history of diastolic heart failure, DVT on Eliquis and hypertension presented to the emergency room on 11/26 with complaints of shortness of breath and palpitations and found to be in new onset atrial fibrillation with rapid ventricular rate and acute diastolic heart failure.  Patient started on Cardizem drip and IV Lasix.  Assessment and Plan: # A-fib with RVR-rapid ventricular rate resolved Symptomatic. RVR resolved with starting dilt. Had normal EF on TTE August of this year with dilated left atrium. Already on apixaban. Tsh within normal limits. Chads2vasc is 6. Small effusions on CTA which shows no PE, no sig lower extremity edema. All this in setting of a couple weeks without home meds. (Had run out while visiting friend).  Cardizem changed to p.o. and new prescription given.    Acute on chronic diastolic heart failure diuresed over a liter with 1 dose of Lasix.  Will likely give 1 additional dose.  Resolved    # Debility Very sob when RN attempted to mobilize today, couldn't make it out of bed Physical and Occupational Therapy are recommending home health, however patient's daughter declined   #  CAD Asymptomatic, troponins mildly elevated and flat suspect demand, no signs ischemia on EKG - cont apixaban, atorvastatin.   # HTN Here bp mildly elevated - hold home lisinopril and amlodipine, restarted on discharge - diltiazem and lasix as above   # Pancytopenia Chronic, stable - outpt f/u   #Stage IIIa chronic kidney disease: Looks to be at baseline        Consultants: None Procedures performed: None Disposition: Home Diet recommendation:  Discharge Diet Orders (From admission, onward)     Start     Ordered   04/07/22 0000  Diet - low sodium heart healthy        04/07/22 1115           Heart healthy DISCHARGE MEDICATION: Allergies as of 04/07/2022   No Known Allergies      Medication List     STOP taking these medications    sertraline 50 MG tablet Commonly known as: ZOLOFT       TAKE these medications    amLODipine 10 MG tablet Commonly known as: NORVASC TAKE 1 TABLET(10 MG) BY MOUTH DAILY   apixaban 5 MG Tabs tablet Commonly known as: ELIQUIS Take 1 tablet (5 mg total) by mouth 2 (two) times daily for 14 days. From 01/07/22--take 5 mg twice a day What changed: how much to take   atorvastatin 40 MG tablet Commonly known as: LIPITOR Take 1 tablet (40 mg total) by mouth daily.   diltiazem 240 MG 24 hr capsule Commonly known as: CARDIZEM CD Take 1 capsule (240 mg total)  by mouth daily. Start taking on: April 08, 2022   Ensure Take 237 mLs by mouth 2 (two) times daily between meals.   furosemide 20 MG tablet Commonly known as: Lasix Take 1 tablet (20 mg total) by mouth every other day for 90 doses.   lisinopril 20 MG tablet Commonly known as: ZESTRIL TAKE 1 TABLET(20 MG) BY MOUTH DAILY What changed: See the new instructions.   ONE-A-DAY WOMENS PO Take 1 tablet by mouth daily.   pantoprazole 40 MG tablet Commonly known as: PROTONIX TAKE 1 TABLET(40 MG) BY MOUTH DAILY        Discharge Exam: Filed Weights   04/04/22  1150 04/07/22 0500  Weight: 47.6 kg 45.3 kg   General: Alert and oriented x 2, no acute distress Cardiovascular: Irregular rhythm, rate controlled  Condition at discharge: good  The results of significant diagnostics from this hospitalization (including imaging, microbiology, ancillary and laboratory) are listed below for reference.   Imaging Studies: CT Angio Chest PE W/Cm &/Or Wo Cm  Result Date: 04/04/2022 CLINICAL DATA:  86 year old female with shortness of breath. EXAM: CT ANGIOGRAPHY CHEST WITH CONTRAST TECHNIQUE: Multidetector CT imaging of the chest was performed using the standard protocol during bolus administration of intravenous contrast. Multiplanar CT image reconstructions and MIPs were obtained to evaluate the vascular anatomy. RADIATION DOSE REDUCTION: This exam was performed according to the departmental dose-optimization program which includes automated exposure control, adjustment of the mA and/or kV according to patient size and/or use of iterative reconstruction technique. CONTRAST:  66m OMNIPAQUE IOHEXOL 350 MG/ML SOLN COMPARISON:  12/31/2021 chest CT. FINDINGS: Cardiovascular: Majority of the study is technically satisfactory but during the initial run, IV tubing leaked and the lung bases were not imaged. An additional sequence was performed but slightly delayed with less than optimal contrast opacification of the basilar pulmonary arteries. No pulmonary emboli are identified. Cardiomegaly and coronary artery and aortic atherosclerotic calcifications are again noted. There is no evidence of thoracic aortic aneurysm or pericardial effusion. Mediastinum/Nodes: No enlarged mediastinal, hilar, or axillary lymph nodes. Thyroid gland, trachea, and esophagus demonstrate no significant findings. A large hiatal hernia is again noted. Lungs/Pleura: There is no evidence of airspace disease, consolidation, mass, suspicious nodule or pneumothorax. Trace bilateral pleural effusions are noted,  decreased on the RIGHT. Scattered areas of scarring within both lungs again identified. Upper Abdomen: No acute abnormality. Musculoskeletal: No acute or suspicious bony abnormalities are noted. Remote bilateral rib fractures again identified. Review of the MIP images confirms the above findings. IMPRESSION: 1. No evidence of pulmonary emboli or thoracic aortic aneurysm. 2. Trace bilateral pleural effusions, decreased on the RIGHT. 3. Cardiomegaly and coronary artery disease. Aortic Atherosclerosis (ICD10-I70.0). 4. Large hiatal hernia. Electronically Signed   By: JMargarette CanadaM.D.   On: 04/04/2022 15:07   DG Chest 2 View  Result Date: 04/04/2022 CLINICAL DATA:  Shortness of breath. EXAM: CHEST - 2 VIEW COMPARISON:  08/30/2021 FINDINGS: Stable cardiomediastinal contours. Aortic atherosclerotic calcifications. Large hiatal hernia is again noted. Blunting of the right posterior costophrenic angle is best seen on the lateral projection radiograph and is concerning for a small effusion. Pulmonary vascular congestion noted without frank edema. No airspace opacities. Healing right lateral rib fractures are again seen. IMPRESSION: 1. Suspect small right pleural effusion. 2. Pulmonary vascular congestion. 3. Healing right lateral rib fractures. Electronically Signed   By: TKerby MoorsM.D.   On: 04/04/2022 12:19    Microbiology: Results for orders placed or performed during the hospital encounter  of 04/04/22  Resp Panel by RT-PCR (Flu A&B, Covid) Anterior Nasal Swab     Status: None   Collection Time: 04/04/22 11:56 AM   Specimen: Anterior Nasal Swab  Result Value Ref Range Status   SARS Coronavirus 2 by RT PCR NEGATIVE NEGATIVE Final    Comment: (NOTE) SARS-CoV-2 target nucleic acids are NOT DETECTED.  The SARS-CoV-2 RNA is generally detectable in upper respiratory specimens during the acute phase of infection. The lowest concentration of SARS-CoV-2 viral copies this assay can detect is 138 copies/mL.  A negative result does not preclude SARS-Cov-2 infection and should not be used as the sole basis for treatment or other patient management decisions. A negative result may occur with  improper specimen collection/handling, submission of specimen other than nasopharyngeal swab, presence of viral mutation(s) within the areas targeted by this assay, and inadequate number of viral copies(<138 copies/mL). A negative result must be combined with clinical observations, patient history, and epidemiological information. The expected result is Negative.  Fact Sheet for Patients:  EntrepreneurPulse.com.au  Fact Sheet for Healthcare Providers:  IncredibleEmployment.be  This test is no t yet approved or cleared by the [place] FDA and  has been authorized for detection and/or diagnosis of SARS-CoV-2 by FDA under an Emergency Use Authorization (EUA). This EUA will remain  in effect (meaning this test can be used) for the duration of the COVID-19 declaration under Section 564(b)(1) of the Act, 21 U.S.C.section 360bbb-3(b)(1), unless the authorization is terminated  or revoked sooner.       Influenza A by PCR NEGATIVE NEGATIVE Final   Influenza B by PCR NEGATIVE NEGATIVE Final    Comment: (NOTE) The Xpert Xpress SARS-CoV-2/FLU/RSV plus assay is intended as an aid in the diagnosis of influenza from Nasopharyngeal swab specimens and should not be used as a sole basis for treatment. Nasal washings and aspirates are unacceptable for Xpert Xpress SARS-CoV-2/FLU/RSV testing.  Fact Sheet for Patients: EntrepreneurPulse.com.au  Fact Sheet for Healthcare Providers: IncredibleEmployment.be  This test is not yet approved or cleared by the [place] FDA and has been authorized for detection and/or diagnosis of SARS-CoV-2 by FDA under an Emergency Use Authorization (EUA). This EUA will remain in effect (meaning this test  can be used) for the duration of the COVID-19 declaration under Section 564(b)(1) of the Act, 21 U.S.C. section 360bbb-3(b)(1), unless the authorization is terminated or revoked.  Performed at [facility], [address] [place], [place] [address]     Labs: CBC: Recent Labs  Lab 04/04/22 1151 04/05/22 0342 04/06/22 0426 04/07/22 0707  WBC 3.3* 3.2* 3.6* 4.0  HGB 11.4* 10.5* 10.8* 12.5  HCT 34.4* 32.0* 32.3* 36.9  MCV 98.0 97.6 97.6 96.6  PLT 144* 149* 157 826   Basic Metabolic Panel: Recent Labs  Lab 04/04/22 1151 04/05/22 0342 04/06/22 0426 04/07/22 0707  NA 138 136 136 135  K 3.5 3.7 3.8 4.1  CL 104 100 100 98  CO2 '26 31 30 30  '$ GLUCOSE 132* 107* 109* 108*  BUN '11 17 18 19  '$ CREATININE 0.99 1.20* 1.09* 1.12*  CALCIUM 8.5* 8.6* 8.5* 9.0  MG 2.1  --   --   --    Liver Function Tests: No results for input(s): "AST", "ALT", "ALKPHOS", "BILITOT", "PROT", "ALBUMIN" in the last 168 hours. CBG: No results for input(s): "GLUCAP" in the last 168 hours.  Discharge time spent: less than 30 minutes.  Signed: Annita Brod, MD [facility] 04/07/2022

## 2022-04-07 NOTE — Progress Notes (Signed)
   Heart Failure Nurse Navigator Note  Met with patient today, she was lying in bed having just finished breakfast.  By teach back went over daily weights, fluid and sodium restriction.  Needs reinforcement.  She states she will never go without her medications again.  Stressed the importance of follow up in the outpatient HFC.  She voices understanding.  She had no further questions.  Pricilla Riffle RN [facility]

## 2022-04-07 NOTE — TOC Transition Note (Addendum)
Transition of Care [facility]) - CM/SW Discharge Note   Patient Details  Name: Vanessa Farmer MRN: [id number] Date of Birth: 04/10/1936  Transition of Care [facility]) CM/SW Contact:  Quin Hoop, LCSW Phone Number: 04/07/2022, 12:37 PM   Clinical Narrative:    SW spoke with pts daughter, Vanessa Farmer, who declined HH OT/PT.  States that pt lives with her and she doesn't need the [facility] services.     Final next level of care: Home/Self Care Barriers to Discharge: No Barriers Identified   Patient Goals and CMS Choice Patient states their goals for this hospitalization and ongoing recovery are:: to go home      Discharge Placement:  Home with care from daughter, Vanessa Farmer.  HH OT/PT declined.                  Name of family member notified: Vanessa Farmer [phone] Patient and family notified of of transfer: 04/07/22  Discharge Plan and Services                                     Social Determinants of Health (SDOH) Interventions     Readmission Risk Interventions     No data to display

## 2022-04-08 ENCOUNTER — Telehealth: Payer: Self-pay | Admitting: *Deleted

## 2022-04-08 ENCOUNTER — Other Ambulatory Visit: Payer: Self-pay

## 2022-04-08 DIAGNOSIS — I2510 Atherosclerotic heart disease of native coronary artery without angina pectoris: Secondary | ICD-10-CM

## 2022-04-08 DIAGNOSIS — I5033 Acute on chronic diastolic (congestive) heart failure: Secondary | ICD-10-CM

## 2022-04-08 DIAGNOSIS — I10 Essential (primary) hypertension: Secondary | ICD-10-CM

## 2022-04-08 DIAGNOSIS — I4891 Unspecified atrial fibrillation: Secondary | ICD-10-CM

## 2022-04-08 NOTE — Progress Notes (Signed)
Received new referral, see 03/29/2022 phone note

## 2022-04-08 NOTE — Patient Outreach (Signed)
  Care Coordination [facility] Note Transition Care Management Unsuccessful Follow-up Telephone Call  Date of discharge and from where:  [facility] 04/07/2022  Attempts:  1st Attempt  Reason for unsuccessful TCM follow-up call:  Left voice message  [place] Care Management [phone]

## 2022-04-08 NOTE — Patient Outreach (Unsigned)
JONNELLE LAWNICZAK [birth year] [phone]   [facility] Liaison for patient who was admitted to [facility]  Follow up:  Patient transitioned home and was to start care coordination with [facility] RN CM prior to hospitalization.  Referral request made for ongoing care coordination as patient/family declined home health follow up as well for readmission prevention.  Natividad Brood, RN BSN [place]  [phone] business mobile phone Toll free office [phone]  *[place]  [phone] Fax number: [phone] [email or web address]

## 2022-04-08 NOTE — Progress Notes (Signed)
  Care Coordination  Outreach Note  04/08/2022 Name: Vanessa Farmer MRN: [id number] DOB: [birth year]   Care Coordination Outreach Attempts: An unsuccessful telephone outreach was attempted today to offer the patient information about available care coordination services as a benefit of their health plan.   Referral received   Follow Up Plan:  Additional outreach attempts will be made to offer the patient care coordination information and services.   Encounter Outcome:  No Answer  Julian Hy, [place] Direct Dial: [phone]

## 2022-04-09 ENCOUNTER — Telehealth: Payer: Self-pay

## 2022-04-09 NOTE — Patient Outreach (Signed)
  Care Coordination TOC Note Transition Care Management Unsuccessful Follow-up Telephone Call  Date of discharge and from where:  04/07/22-[facility]  Attempts:  3rd Attempt  Reason for unsuccessful TCM follow-up call:  Unable to reach patient     Enzo Montgomery, RN,BSN,CCM [place] Management Telephonic Care Management Coordinator Direct Phone: [phone] Toll Free: [phone] Fax: [phone]

## 2022-04-09 NOTE — Patient Outreach (Signed)
  Care Coordination TOC Note Transition Care Management Unsuccessful Follow-up Telephone Call  Date of discharge and from where:  04/07/22-[facility]  Attempts:  2nd Attempt  Reason for unsuccessful TCM follow-up call:  Unable to reach patient    Enzo Montgomery, RN,BSN,CCM [place] Management Telephonic Care Management Coordinator Direct Phone: [phone] Toll Free: [phone] Fax: [phone]

## 2022-04-12 ENCOUNTER — Telehealth: Payer: Self-pay | Admitting: Internal Medicine

## 2022-04-12 DIAGNOSIS — E785 Hyperlipidemia, unspecified: Secondary | ICD-10-CM

## 2022-04-12 NOTE — Telephone Encounter (Signed)
Pt says that the patient has misplaced all of her medication. She cannot find any of her prescriptions other than what was given to her at the hospital. Pt's daughter is asking to have refills  [place], [address] AT [address]  [place] [address]  Phone: [phone] Fax: [phone]

## 2022-04-12 NOTE — Telephone Encounter (Signed)
Patient's daughter called, left VM to return the call to the office. Will need to know which medications to be refilled. Amlopidine last refill 04/06/22/1 refill (will need to call pharmacy for refill) Lisinopril last refill 04/06/22/1 refill (will need to call pharmacy for refill) Pantroprazole last refill 03/08/22/3 refills (will need to call pharmacy for refill) All others were sent in by other providers.

## 2022-04-12 NOTE — Progress Notes (Unsigned)
  Care Coordination  Outreach Note  04/12/2022 Name: Vanessa Farmer MRN: [id number] DOB: 1935-10-15   Care Coordination Outreach Attempts: A second unsuccessful outreach was attempted today to offer the patient with information about available care coordination services as a benefit of their health plan.     Referral received   Follow Up Plan:  Additional outreach attempts will be made to offer the patient care coordination information and services.   Encounter Outcome:  No Answer  Julian Hy, [place] Direct Dial: [phone]

## 2022-04-13 ENCOUNTER — Inpatient Hospital Stay: Payer: Medicare Other | Admitting: Internal Medicine

## 2022-04-13 NOTE — Progress Notes (Deleted)
Date:  04/13/2022   Name:  Vanessa Farmer   DOB:  February 03, 1936   MRN:  [id number]   Chief Complaint: No chief complaint on file. Hospital follow up.  Admitted to [facility] with Afib and RVR and acute on chronic CHF.  04/04/22 - 04/07/22.  TOC call done. HPI Recommendations at discharge:  + New medication: Cardizem CD 240 p.o. daily Patient will follow-up with PCP in the next 1 month   Discharge Diagnoses: Principal Problem:   Atrial fibrillation with RVR ([place]) Active Problems:   Acute on chronic diastolic (congestive) heart failure (HCC)   Pancytopenia (HCC)   Essential hypertension   CKD (chronic kidney disease) stage 3, GFR 30-59 ml/min (HCC)   CAD (coronary artery disease)   History of DVT of lower extremity   Resolved Problems:   * No resolved hospital problems. *   Hospital Course: 86 year old female with past medical history of diastolic heart failure, DVT on Eliquis and hypertension presented to the emergency room on 11/26 with complaints of shortness of breath and palpitations and found to be in new onset atrial fibrillation with rapid ventricular rate and acute diastolic heart failure.  Patient started on Cardizem drip and IV Lasix.   Assessment and Plan: # A-fib with RVR-rapid ventricular rate resolved Symptomatic. RVR resolved with starting dilt. Had normal EF on TTE August of this year with dilated left atrium. Already on apixaban. Tsh within normal limits. Chads2vasc is 6. Small effusions on CTA which shows no PE, no sig lower extremity edema. All this in setting of a couple weeks without home meds. (Had run out while visiting friend).  Cardizem changed to p.o. and new prescription given.     Acute on chronic diastolic heart failure diuresed over a liter with 1 dose of Lasix.  Will likely give 1 additional dose.  Resolved     # Debility Very sob when RN attempted to mobilize today, couldn't make it out of bed Physical and Occupational Therapy are recommending home  health, however patient's daughter declined   # CAD Asymptomatic, troponins mildly elevated and flat suspect demand, no signs ischemia on EKG - cont apixaban, atorvastatin.   # HTN Here bp mildly elevated - hold home lisinopril and amlodipine, restarted on discharge - diltiazem and lasix as above   # Pancytopenia Chronic, stable - outpt f/u   #Stage IIIa chronic kidney disease: Looks to be at baseline      STOP taking these medications     sertraline 50 MG tablet Commonly known as: ZOLOFT           TAKE these medications     amLODipine 10 MG tablet Commonly known as: NORVASC TAKE 1 TABLET(10 MG) BY MOUTH DAILY    apixaban 5 MG Tabs tablet Commonly known as: ELIQUIS Take 1 tablet (5 mg total) by mouth 2 (two) times daily for 14 days. From 01/07/22--take 5 mg twice a day What changed: how much to take    atorvastatin 40 MG tablet Commonly known as: LIPITOR Take 1 tablet (40 mg total) by mouth daily.    diltiazem 240 MG 24 hr capsule Commonly known as: CARDIZEM CD Take 1 capsule (240 mg total) by mouth daily. Start taking on: April 08, 2022    Ensure Take 237 mLs by mouth 2 (two) times daily between meals.    furosemide 20 MG tablet Commonly known as: Lasix Take 1 tablet (20 mg total) by mouth every other day for 90 doses.  lisinopril 20 MG tablet Commonly known as: ZESTRIL TAKE 1 TABLET(20 MG) BY MOUTH DAILY What changed: See the new instructions.    ONE-A-DAY WOMENS PO Take 1 tablet by mouth daily.    pantoprazole 40 MG tablet Commonly known as: PROTONIX TAKE 1 TABLET(40 MG) BY MOUTH DAILY   Lab Results  Component Value Date   NA 135 04/07/2022   K 4.1 04/07/2022   CO2 30 04/07/2022   GLUCOSE 108 (H) 04/07/2022   BUN 19 04/07/2022   CREATININE 1.12 (H) 04/07/2022   CALCIUM 9.0 04/07/2022   EGFR 48 (L) 01/19/2022   GFRNONAA 48 (L) 04/07/2022   Lab Results  Component Value Date   CHOL 162 10/17/2020   HDL 71 10/17/2020   LDLCALC 80  10/17/2020   TRIG 52 10/17/2020   CHOLHDL 2.3 10/17/2020   Lab Results  Component Value Date   TSH 1.377 04/04/2022   No results found for: "HGBA1C" Lab Results  Component Value Date   WBC 4.0 04/07/2022   HGB 12.5 04/07/2022   HCT 36.9 04/07/2022   MCV 96.6 04/07/2022   PLT 182 04/07/2022   Lab Results  Component Value Date   ALT 11 01/19/2022   AST 16 01/19/2022   ALKPHOS 102 01/19/2022   BILITOT 0.4 01/19/2022   Lab Results  Component Value Date   VD25OH 43.1 06/10/2017     Review of Systems  Patient Active Problem List   Diagnosis Date Noted   History of DVT of lower extremity 04/05/2022   (HFpEF) heart failure with preserved ejection fraction ([place]) 04/05/2022   Atrial fibrillation with RVR ([place]) 04/04/2022   Hypokalemia 12/31/2021   Acute on chronic diastolic (congestive) heart failure ([place]) 12/30/2021   Aortic atherosclerosis ([place]) 10/20/2020   Ganglion cyst of wrist, right 03/20/2019   Thrombocytopenia ([place]) 07/19/2018   Iron deficiency anemia    Polyp of sigmoid colon    Pancytopenia ([place]) 09/30/2017   GERD (gastroesophageal reflux disease) 06/10/2017   CKD (chronic kidney disease) stage 3, GFR 30-59 ml/min ([place]) 06/08/2016   Hyperlipidemia 06/08/2016   Scoliosis 06/07/2016   Osteoporosis 06/07/2016   Essential hypertension 06/07/2016   CAD (coronary artery disease) 06/07/2016   Depression, major, single episode, moderate ([place]) 06/07/2016    No Known Allergies  Past Surgical History:  Procedure Laterality Date   ABDOMINAL HYSTERECTOMY     CATARACT EXTRACTION, BILATERAL  2021   COLONOSCOPY WITH PROPOFOL N/A 12/05/2017   Procedure: COLONOSCOPY WITH PROPOFOL;  Surgeon: Lucilla Lame, MD;  Location: [place];  Service: Endoscopy;  Laterality: N/A;   ESOPHAGOGASTRODUODENOSCOPY (EGD) WITH PROPOFOL N/A 12/05/2017   Procedure: ESOPHAGOGASTRODUODENOSCOPY (EGD) WITH PROPOFOL;  Surgeon: Lucilla Lame, MD;  Location: [place];  Service:  Endoscopy;  Laterality: N/A;   POLYPECTOMY  12/05/2017   Procedure: POLYPECTOMY;  Surgeon: Lucilla Lame, MD;  Location[facility];  Service: Endoscopy;;   TONSILLECTOMY      Social History   Tobacco Use   Smoking status: Never   Smokeless tobacco: Never   Tobacco comments:    smoking cessation materials not required  Vaping Use   Vaping Use: Never used  Substance Use Topics   Alcohol use: No   Drug use: No     Medication list has been reviewed and updated.  No outpatient medications have been marked as taking for the 04/13/22 encounter (Appointment) with Glean Hess, MD.       02/22/2022    3:16 PM 01/19/2022    2:18  PM 01/24/2021    2:24 PM 10/17/2020    8:11 AM  GAD 7 : Generalized Anxiety Score  Nervous, Anxious, on Edge 1 0 1 0  Control/stop worrying 0 0 2 0  Worry too much - different things 0 0 2 0  Trouble relaxing 0 0 0 0  Restless 0 0 0 0  Easily annoyed or irritable 0 0 1 0  Afraid - awful might happen 0 0 0 0  Total GAD 7 Score 1 0 6 0  Anxiety Difficulty Not difficult at all Not difficult at all Not difficult at all        02/22/2022    3:16 PM 01/29/2022    2:58 PM 01/19/2022    2:18 PM  Depression screen PHQ 2/9  Decreased Interest 0 0 1  Down, Depressed, Hopeless 0 0 1  PHQ - 2 Score 0 0 2  Altered sleeping 0 0 0  Tired, decreased energy 0 0 0  Change in appetite 0 0 0  Feeling bad or failure about yourself  0 0 0  Trouble concentrating 0 0 0  Moving slowly or fidgety/restless 0 0 0  Suicidal thoughts 0 0 0  PHQ-9 Score 0 0 2  Difficult doing work/chores Not difficult at all Not difficult at all Not difficult at all    BP Readings from Last 3 Encounters:  04/07/22 (!) 148/84  02/22/22 114/60  01/19/22 (!) 142/96    Physical Exam  Wt Readings from Last 3 Encounters:  04/07/22 99 lb 13.9 oz (45.3 kg)  02/22/22 105 lb (47.6 kg)  01/19/22 99 lb 12.8 oz (45.3 kg)    There were no vitals taken for this  visit.  Assessment and Plan:

## 2022-04-14 ENCOUNTER — Encounter: Payer: Medicare Other | Admitting: Family

## 2022-04-14 NOTE — Progress Notes (Signed)
  Care Coordination  Outreach Note  04/14/2022 Name: Vanessa Farmer MRN: [id number] DOB: 01/29/1936   Care Coordination Outreach Attempts: A third unsuccessful outreach was attempted today to offer the patient with information about available care coordination services as a benefit of their health plan.   Referral received   Follow Up Plan:  No further outreach attempts will be made at this time. We have been unable to contact the patient to offer or enroll patient in care coordination services  Encounter Outcome:  No Answer  Julian Hy, [place] Direct Dial: [phone]

## 2022-04-15 ENCOUNTER — Encounter: Payer: Medicare Other | Admitting: Family

## 2022-04-15 ENCOUNTER — Telehealth: Payer: Self-pay | Admitting: Family

## 2022-04-15 NOTE — Progress Notes (Deleted)
   Patient ID: Vanessa Farmer, female    DOB: Oct 20, 1935, 86 y.o.   MRN: [id number]  HPI  [name] is a 86 y/o female with a history of  Echo report from 12/31/21 showed an EF of 55-60% along with moderate LVH, normal PA pressure of 19.9 mmHg, moderate LAE and mild MR.   Admitted 04/04/22 due to shortness of breath and palpitations and found to be in new onset atrial fibrillation with rapid ventricular rate and acute diastolic heart failure. Patient started on Cardizem drip and IV Lasix. Small effusions on CTA which shows no PE, no sig lower extremity edema. All this in setting of a couple weeks without home meds. (Had run out while visiting friend).  Cardizem changed to p.o. Elevated troponins thought to be due to demand ischemia. Discharged after 3 days.   She presents today for her initial visit with a chief complaint of   Review of Systems    Physical Exam     Assessment & Plan:  1: Chronic heart failure with preserved ejection fraction with structural changes (LVH)- - NYHA class - on GDMT   2: HTN with CKD- - BP  3: Atrial fibrillation-  4: DVT-

## 2022-04-15 NOTE — Telephone Encounter (Signed)
Patient did not show for her initial Heart Failure Clinic appointment on 04/15/22. Will attempt to reschedule.

## 2022-05-29 NOTE — Patient Instructions (Incomplete)
Heart Failure Action Plan A heart failure action plan helps you understand what to do when you have symptoms of heart failure. Your action plan is a color-coded plan that lists the symptoms to watch for and indicates what actions to take. If you have symptoms in the red zone, you need medical care right away. If you have symptoms in the yellow zone, you are having problems. If you have symptoms in the green zone, you are doing well. Follow the plan that was created by you and your health care provider. Review your plan each time you visit your health care provider. Red zone These signs and symptoms mean you should get medical help right away: You have trouble breathing when resting. You have a dry cough that is getting worse. You have swelling or pain in your legs or abdomen that is getting worse. You suddenly gain more than 2-3 lb (0.9-1.4 kg) in 24 hours, or more than 5 lb (2.3 kg) in a week. This amount may be more or less depending on your condition. You have trouble staying awake or you feel confused. You have chest pain. You do not have an appetite. You pass out. You have worsening sadness or depression. If you have any of these symptoms, call your local emergency services (911 in the [place] right away. Do not drive yourself to the hospital. Yellow zone These signs and symptoms mean your condition may be getting worse and you should make some changes: You have trouble breathing when you are active, or you need to sleep with your head raised on extra pillows to help you breathe. You have swelling in your legs or abdomen. You gain 2-3 lb (0.9-1.4 kg) in 24 hours, or 5 lb (2.3 kg) in a week. This amount may be more or less depending on your condition. You get tired easily. You have trouble sleeping. You have a dry cough. If you have any of these symptoms: Contact your health care provider within the next day. Your health care provider may adjust your medicines. Green zone These signs  mean you are doing well and can continue what you are doing: You do not have shortness of breath. You have very little swelling or no new swelling. Your weight is stable (no gain or loss). You have a normal activity level. You do not have chest pain or any other new symptoms. Follow these instructions at home: Take over-the-counter and prescription medicines only as told by your health care provider. Weigh yourself daily. Your target weight is __________ lb (__________ kg). Call your health care provider if you gain more than __________ lb (__________ kg) in 24 hours, or more than __________ lb (__________ kg) in a week. Health care provider name: _____________________________________________________ Health care provider phone number: _____________________________________________________ Eat a heart-healthy diet. Work with a diet and nutrition specialist (dietitian) to create an eating plan that is best for you. Keep all follow-up visits. This is important. Where to find more information American Heart Association: Summary A heart failure action plan helps you understand what to do when you have symptoms of heart failure. Follow the action plan that was created by you and your health care provider. Get help right away if you have any symptoms in the red zone. This information is not intended to replace advice given to you by your health care provider. Make sure you discuss any questions you have with your health care provider. Document Revised: 08/04/2021 Document Reviewed: 12/10/2019 Elsevier Patient Education  2023 Elsevier Inc.  

## 2022-06-03 ENCOUNTER — Ambulatory Visit: Payer: Medicare Other | Admitting: Nurse Practitioner

## 2022-07-29 ENCOUNTER — Other Ambulatory Visit: Payer: Self-pay | Admitting: Internal Medicine

## 2022-07-29 DIAGNOSIS — E785 Hyperlipidemia, unspecified: Secondary | ICD-10-CM

## 2022-07-29 DIAGNOSIS — I10 Essential (primary) hypertension: Secondary | ICD-10-CM

## 2022-07-29 DIAGNOSIS — F321 Major depressive disorder, single episode, moderate: Secondary | ICD-10-CM

## 2022-07-30 ENCOUNTER — Other Ambulatory Visit: Payer: Self-pay | Admitting: Internal Medicine

## 2022-08-09 ENCOUNTER — Ambulatory Visit: Payer: Medicare Other | Admitting: Internal Medicine

## 2022-08-24 ENCOUNTER — Encounter: Payer: Medicare Other | Admitting: Internal Medicine

## 2022-08-24 NOTE — Progress Notes (Deleted)
Date:  08/24/2022   Name:  Vanessa Farmer   DOB:  [birth year]   MRN:  [id number]   Chief Complaint: No chief complaint on file. Vanessa Farmer is a 87 y.o. female who presents today for her Complete Annual Exam. She feels {DESC; WELL/FAIRLY WELL/POORLY:18703}. She reports exercising ***. She reports she is sleeping {DESC; WELL/FAIRLY WELL/POORLY:18703}. Breast complaints ***.  Mammogram: none in years DEXA: none Colonoscopy: 2019 benign polyps - aged out of screening  Health Maintenance Due  Topic Date Due   Zoster Vaccines- Shingrix (1 of 2) Never done    Immunization History  Administered Date(s) Administered   Fluad Quad(high Dose 65+) 03/20/2019, 03/21/2020, 01/19/2022   Influenza, High Dose Seasonal PF 01/31/2017   Pneumococcal Conjugate-13 06/07/2016   Pneumococcal Polysaccharide-23 01/17/2018   Tdap 06/07/2016    Hypertension This is a chronic problem. The problem is controlled.  Hyperlipidemia The problem is controlled. Current antihyperlipidemic treatment includes statins.  Depression        This is a chronic problem.  The problem has been resolved since onset.  Past treatments include SSRIs - Selective serotonin reuptake inhibitors. Gastroesophageal Reflux This is a recurrent problem. The problem occurs rarely. She has tried a PPI for the symptoms.    Lab Results  Component Value Date   NA 135 04/07/2022   K 4.1 04/07/2022   CO2 30 04/07/2022   GLUCOSE 108 (H) 04/07/2022   BUN 19 04/07/2022   CREATININE 1.12 (H) 04/07/2022   CALCIUM 9.0 04/07/2022   EGFR 48 (L) 01/19/2022   GFRNONAA 48 (L) 04/07/2022   Lab Results  Component Value Date   CHOL 162 10/17/2020   HDL 71 10/17/2020   LDLCALC 80 10/17/2020   TRIG 52 10/17/2020   CHOLHDL 2.3 10/17/2020   Lab Results  Component Value Date   TSH 1.377 04/04/2022   No results found for: "HGBA1C" Lab Results  Component Value Date   WBC 4.0 04/07/2022   HGB 12.5 04/07/2022   HCT 36.9 04/07/2022   MCV  96.6 04/07/2022   PLT 182 04/07/2022   Lab Results  Component Value Date   ALT 11 01/19/2022   AST 16 01/19/2022   ALKPHOS 102 01/19/2022   BILITOT 0.4 01/19/2022   Lab Results  Component Value Date   VD25OH 43.1 06/10/2017     Review of Systems  Psychiatric/Behavioral:  Positive for depression.     Patient Active Problem List   Diagnosis Date Noted   History of DVT of lower extremity 04/05/2022   (HFpEF) heart failure with preserved ejection fraction 04/05/2022   Atrial fibrillation with RVR 04/04/2022   Hypokalemia 12/31/2021   Aortic atherosclerosis 10/20/2020   Ganglion cyst of wrist, right 03/20/2019   Thrombocytopenia 07/19/2018   Iron deficiency anemia    Polyp of sigmoid colon    Pancytopenia 09/30/2017   GERD (gastroesophageal reflux disease) 06/10/2017   CKD (chronic kidney disease) stage 3, GFR 30-59 ml/min 06/08/2016   Hyperlipidemia 06/08/2016   Scoliosis 06/07/2016   Osteoporosis 06/07/2016   Essential hypertension 06/07/2016   CAD (coronary artery disease) 06/07/2016   Depression, major, single episode, moderate 06/07/2016    No Known Allergies  Past Surgical History:  Procedure Laterality Date   ABDOMINAL HYSTERECTOMY     CATARACT EXTRACTION, BILATERAL  2021   COLONOSCOPY WITH PROPOFOL N/A 12/05/2017   Procedure: COLONOSCOPY WITH PROPOFOL;  Surgeon: Midge Minium, MD;  Location: [facility];  Service: Endoscopy;  Laterality: N/A;   ESOPHAGOGASTRODUODENOSCOPY (  EGD) WITH PROPOFOL N/A 12/05/2017   Procedure: ESOPHAGOGASTRODUODENOSCOPY (EGD) WITH PROPOFOL;  Surgeon: Midge Minium, MD;  Location: [facility];  Service: Endoscopy;  Laterality: N/A;   POLYPECTOMY  12/05/2017   Procedure: POLYPECTOMY;  Surgeon: Midge Minium, MD;  Location: [facility];  Service: Endoscopy;;   TONSILLECTOMY      Social History   Tobacco Use   Smoking status: Never   Smokeless tobacco: Never   Tobacco comments:    smoking cessation materials not  required  Vaping Use   Vaping Use: Never used  Substance Use Topics   Alcohol use: No   Drug use: No     Medication list has been reviewed and updated.  No outpatient medications have been marked as taking for the 08/24/22 encounter (Appointment) with Reubin Milan, MD.       02/22/2022    3:16 PM 01/19/2022    2:18 PM 01/24/2021    2:24 PM 10/17/2020    8:11 AM  GAD 7 : Generalized Anxiety Score  Nervous, Anxious, on Edge 1 0 1 0  Control/stop worrying 0 0 2 0  Worry too much - different things 0 0 2 0  Trouble relaxing 0 0 0 0  Restless 0 0 0 0  Easily annoyed or irritable 0 0 1 0  Afraid - awful might happen 0 0 0 0  Total GAD 7 Score 1 0 6 0  Anxiety Difficulty Not difficult at all Not difficult at all Not difficult at all        02/22/2022    3:16 PM 01/29/2022    2:58 PM 01/19/2022    2:18 PM  Depression screen PHQ 2/9  Decreased Interest 0 0 1  Down, Depressed, Hopeless 0 0 1  PHQ - 2 Score 0 0 2  Altered sleeping 0 0 0  Tired, decreased energy 0 0 0  Change in appetite 0 0 0  Feeling bad or failure about yourself  0 0 0  Trouble concentrating 0 0 0  Moving slowly or fidgety/restless 0 0 0  Suicidal thoughts 0 0 0  PHQ-9 Score 0 0 2  Difficult doing work/chores Not difficult at all Not difficult at all Not difficult at all    BP Readings from Last 3 Encounters:  04/07/22 (!) 148/84  02/22/22 114/60  01/19/22 (!) 142/96    Physical Exam  Wt Readings from Last 3 Encounters:  04/07/22 99 lb 13.9 oz (45.3 kg)  02/22/22 105 lb (47.6 kg)  01/19/22 99 lb 12.8 oz (45.3 kg)    There were no vitals taken for this visit.  Assessment and Plan:  Problem List Items Addressed This Visit   None   No follow-ups on file.   Partially dictated using Dragon software, any errors are not intentional.  Reubin Milan, MD [facility] [place], [place]

## 2022-08-24 NOTE — Assessment & Plan Note (Deleted)
New onset 04/2022 Did not have hospital follow up after starting Cardiazem po Has not seen Cardiology On Lipitor but only took Eliquis for 2 weeks

## 2022-09-21 ENCOUNTER — Other Ambulatory Visit: Payer: Self-pay | Admitting: Internal Medicine

## 2022-09-21 ENCOUNTER — Telehealth: Payer: Self-pay | Admitting: Internal Medicine

## 2022-09-21 NOTE — Telephone Encounter (Signed)
Could not leave message to set up appointment.

## 2022-10-11 ENCOUNTER — Emergency Department: Payer: Medicare Other

## 2022-10-11 ENCOUNTER — Other Ambulatory Visit: Payer: Self-pay

## 2022-10-11 ENCOUNTER — Inpatient Hospital Stay
Admission: EM | Admit: 2022-10-11 | Discharge: 2022-10-15 | DRG: 445 | Disposition: A | Discharge status: Skilled Nursing Facility | Payer: Medicare Other | Attending: Internal Medicine | Admitting: Internal Medicine

## 2022-10-11 DIAGNOSIS — N1831 Chronic kidney disease, stage 3a: Secondary | ICD-10-CM | POA: Diagnosis not present

## 2022-10-11 DIAGNOSIS — K8000 Calculus of gallbladder with acute cholecystitis without obstruction: Principal | ICD-10-CM | POA: Diagnosis present

## 2022-10-11 DIAGNOSIS — R0602 Shortness of breath: Secondary | ICD-10-CM | POA: Diagnosis not present

## 2022-10-11 DIAGNOSIS — E785 Hyperlipidemia, unspecified: Secondary | ICD-10-CM | POA: Diagnosis not present

## 2022-10-11 DIAGNOSIS — Z681 Body mass index (BMI) 19 or less, adult: Secondary | ICD-10-CM | POA: Diagnosis not present

## 2022-10-11 DIAGNOSIS — R262 Difficulty in walking, not elsewhere classified: Secondary | ICD-10-CM | POA: Diagnosis not present

## 2022-10-11 DIAGNOSIS — Z86718 Personal history of other venous thrombosis and embolism: Secondary | ICD-10-CM | POA: Diagnosis not present

## 2022-10-11 DIAGNOSIS — E440 Moderate protein-calorie malnutrition: Secondary | ICD-10-CM | POA: Diagnosis not present

## 2022-10-11 DIAGNOSIS — K810 Acute cholecystitis: Secondary | ICD-10-CM | POA: Diagnosis present

## 2022-10-11 DIAGNOSIS — R1111 Vomiting without nausea: Secondary | ICD-10-CM | POA: Diagnosis not present

## 2022-10-11 DIAGNOSIS — Z7901 Long term (current) use of anticoagulants: Secondary | ICD-10-CM | POA: Diagnosis not present

## 2022-10-11 DIAGNOSIS — R0789 Other chest pain: Secondary | ICD-10-CM | POA: Diagnosis not present

## 2022-10-11 DIAGNOSIS — R9431 Abnormal electrocardiogram [ECG] [EKG]: Secondary | ICD-10-CM | POA: Diagnosis present

## 2022-10-11 DIAGNOSIS — I482 Chronic atrial fibrillation, unspecified: Secondary | ICD-10-CM | POA: Diagnosis present

## 2022-10-11 DIAGNOSIS — I252 Old myocardial infarction: Secondary | ICD-10-CM

## 2022-10-11 DIAGNOSIS — M6281 Muscle weakness (generalized): Secondary | ICD-10-CM | POA: Diagnosis not present

## 2022-10-11 DIAGNOSIS — F32A Depression, unspecified: Secondary | ICD-10-CM | POA: Diagnosis present

## 2022-10-11 DIAGNOSIS — Z48815 Encounter for surgical aftercare following surgery on the digestive system: Secondary | ICD-10-CM | POA: Diagnosis not present

## 2022-10-11 DIAGNOSIS — I130 Hypertensive heart and chronic kidney disease with heart failure and stage 1 through stage 4 chronic kidney disease, or unspecified chronic kidney disease: Secondary | ICD-10-CM | POA: Diagnosis not present

## 2022-10-11 DIAGNOSIS — K8020 Calculus of gallbladder without cholecystitis without obstruction: Secondary | ICD-10-CM | POA: Diagnosis not present

## 2022-10-11 DIAGNOSIS — R1311 Dysphagia, oral phase: Secondary | ICD-10-CM | POA: Diagnosis not present

## 2022-10-11 DIAGNOSIS — I2510 Atherosclerotic heart disease of native coronary artery without angina pectoris: Secondary | ICD-10-CM | POA: Diagnosis present

## 2022-10-11 DIAGNOSIS — K8011 Calculus of gallbladder with chronic cholecystitis with obstruction: Secondary | ICD-10-CM | POA: Diagnosis present

## 2022-10-11 DIAGNOSIS — Z9181 History of falling: Secondary | ICD-10-CM | POA: Diagnosis not present

## 2022-10-11 DIAGNOSIS — I5032 Chronic diastolic (congestive) heart failure: Secondary | ICD-10-CM | POA: Diagnosis present

## 2022-10-11 DIAGNOSIS — R109 Unspecified abdominal pain: Secondary | ICD-10-CM | POA: Diagnosis not present

## 2022-10-11 DIAGNOSIS — I5030 Unspecified diastolic (congestive) heart failure: Secondary | ICD-10-CM | POA: Diagnosis present

## 2022-10-11 DIAGNOSIS — Z808 Family history of malignant neoplasm of other organs or systems: Secondary | ICD-10-CM | POA: Diagnosis not present

## 2022-10-11 DIAGNOSIS — R269 Unspecified abnormalities of gait and mobility: Secondary | ICD-10-CM | POA: Diagnosis not present

## 2022-10-11 DIAGNOSIS — K567 Ileus, unspecified: Secondary | ICD-10-CM | POA: Diagnosis not present

## 2022-10-11 DIAGNOSIS — K5900 Constipation, unspecified: Secondary | ICD-10-CM | POA: Diagnosis not present

## 2022-10-11 DIAGNOSIS — Z8249 Family history of ischemic heart disease and other diseases of the circulatory system: Secondary | ICD-10-CM

## 2022-10-11 DIAGNOSIS — I10 Essential (primary) hypertension: Secondary | ICD-10-CM | POA: Diagnosis present

## 2022-10-11 DIAGNOSIS — M419 Scoliosis, unspecified: Secondary | ICD-10-CM | POA: Diagnosis not present

## 2022-10-11 DIAGNOSIS — R079 Chest pain, unspecified: Secondary | ICD-10-CM | POA: Diagnosis not present

## 2022-10-11 DIAGNOSIS — K219 Gastro-esophageal reflux disease without esophagitis: Secondary | ICD-10-CM | POA: Diagnosis not present

## 2022-10-11 DIAGNOSIS — R1013 Epigastric pain: Secondary | ICD-10-CM | POA: Diagnosis not present

## 2022-10-11 DIAGNOSIS — Z79899 Other long term (current) drug therapy: Secondary | ICD-10-CM

## 2022-10-11 DIAGNOSIS — K449 Diaphragmatic hernia without obstruction or gangrene: Secondary | ICD-10-CM | POA: Diagnosis not present

## 2022-10-11 DIAGNOSIS — Z433 Encounter for attention to colostomy: Secondary | ICD-10-CM | POA: Diagnosis not present

## 2022-10-11 DIAGNOSIS — Z741 Need for assistance with personal care: Secondary | ICD-10-CM | POA: Diagnosis not present

## 2022-10-11 DIAGNOSIS — M25572 Pain in left ankle and joints of left foot: Secondary | ICD-10-CM | POA: Diagnosis not present

## 2022-10-11 DIAGNOSIS — F321 Major depressive disorder, single episode, moderate: Secondary | ICD-10-CM

## 2022-10-11 HISTORY — DX: Calculus of gallbladder with chronic cholecystitis with obstruction: K80.11

## 2022-10-11 HISTORY — DX: Acute cholecystitis: K81.0

## 2022-10-11 LAB — CBC
HCT: 37.5 % (ref 36.0–46.0)
Hemoglobin: 12.2 g/dL (ref 12.0–15.0)
MCH: 32.9 pg (ref 26.0–34.0)
MCHC: 32.5 g/dL (ref 30.0–36.0)
MCV: 101.1 fL — ABNORMAL HIGH (ref 80.0–100.0)
Platelets: 202 10*3/uL (ref 150–400)
RBC: 3.71 MIL/uL — ABNORMAL LOW (ref 3.87–5.11)
RDW: 13.1 % (ref 11.5–15.5)
WBC: 9 10*3/uL (ref 4.0–10.5)
nRBC: 0 % (ref 0.0–0.2)

## 2022-10-11 LAB — HEPATIC FUNCTION PANEL
ALT: 19 U/L (ref 0–44)
AST: 25 U/L (ref 15–41)
Albumin: 3.8 g/dL (ref 3.5–5.0)
Alkaline Phosphatase: 80 U/L (ref 38–126)
Bilirubin, Direct: <0.1 mg/dL (ref 0.0–0.2)
Total Bilirubin: 0.6 mg/dL (ref 0.3–1.2)
Total Protein: 7.1 g/dL (ref 6.5–8.1)

## 2022-10-11 LAB — HEPARIN LEVEL (UNFRACTIONATED)
Heparin Unfractionated: <0.1 IU/mL — ABNORMAL LOW (ref 0.30–0.70)
Heparin Unfractionated: 0.17 IU/mL — ABNORMAL LOW (ref 0.30–0.70)

## 2022-10-11 LAB — LIPASE, BLOOD: Lipase: 52 U/L — ABNORMAL HIGH (ref 11–51)

## 2022-10-11 LAB — PROTIME-INR
INR: 1.1 (ref 0.8–1.2)
Prothrombin Time: 14.4 seconds (ref 11.4–15.2)

## 2022-10-11 LAB — BASIC METABOLIC PANEL
Anion gap: 10 (ref 5–15)
BUN: 20 mg/dL (ref 8–23)
CO2: 26 mmol/L (ref 22–32)
Calcium: 8.8 mg/dL — ABNORMAL LOW (ref 8.9–10.3)
Chloride: 101 mmol/L (ref 98–111)
Creatinine, Ser: 0.99 mg/dL (ref 0.44–1.00)
GFR, Estimated: 56 mL/min — ABNORMAL LOW (ref >60)
Glucose, Bld: 170 mg/dL — ABNORMAL HIGH (ref 70–99)
Potassium: 3.5 mmol/L (ref 3.5–5.1)
Sodium: 137 mmol/L (ref 135–145)

## 2022-10-11 LAB — BRAIN NATRIURETIC PEPTIDE: B Natriuretic Peptide: 127.4 pg/mL — ABNORMAL HIGH (ref 0.0–100.0)

## 2022-10-11 LAB — APTT: aPTT: 27 seconds (ref 24–36)

## 2022-10-11 LAB — TROPONIN I (HIGH SENSITIVITY)
Troponin I (High Sensitivity): 11 ng/L (ref <18)
Troponin I (High Sensitivity): 12 ng/L (ref <18)

## 2022-10-11 LAB — MAGNESIUM: Magnesium: 2 mg/dL (ref 1.7–2.4)

## 2022-10-11 LAB — TYPE AND SCREEN
ABO/RH(D): A POS
Antibody Screen: NEGATIVE

## 2022-10-11 MED ORDER — DIPHENHYDRAMINE HCL 50 MG/ML IJ SOLN
12.5000 mg | Freq: Three times a day (TID) | INTRAMUSCULAR | Status: DC | PRN
  Administered 2022-10-11: 12.5 mg via INTRAVENOUS
  Filled 2022-10-11: qty 1

## 2022-10-11 MED ORDER — ONDANSETRON HCL 4 MG/2ML IJ SOLN
4.0000 mg | Freq: Once | INTRAMUSCULAR | Status: AC
  Administered 2022-10-11: 4 mg via INTRAVENOUS
  Filled 2022-10-11: qty 2

## 2022-10-11 MED ORDER — PIPERACILLIN-TAZOBACTAM 3.375 G IVPB
3.3750 g | Freq: Three times a day (TID) | INTRAVENOUS | Status: DC
  Administered 2022-10-11 – 2022-10-15 (×12): 3.375 g via INTRAVENOUS
  Filled 2022-10-11 (×12): qty 50

## 2022-10-11 MED ORDER — HEPARIN BOLUS VIA INFUSION
2250.0000 [IU] | Freq: Once | INTRAVENOUS | Status: AC
  Administered 2022-10-11: 2250 [IU] via INTRAVENOUS
  Filled 2022-10-11: qty 2250

## 2022-10-11 MED ORDER — HEPARIN (PORCINE) 25000 UT/250ML-% IV SOLN
800.0000 [IU]/h | INTRAVENOUS | Status: DC
  Administered 2022-10-11: 650 [IU]/h via INTRAVENOUS
  Filled 2022-10-11 (×2): qty 250

## 2022-10-11 MED ORDER — METOPROLOL TARTRATE 5 MG/5ML IV SOLN
5.0000 mg | INTRAVENOUS | Status: DC | PRN
  Administered 2022-10-12: 5 mg via INTRAVENOUS
  Filled 2022-10-11: qty 5

## 2022-10-11 MED ORDER — FENTANYL CITRATE PF 50 MCG/ML IJ SOSY
25.0000 ug | PREFILLED_SYRINGE | Freq: Once | INTRAMUSCULAR | Status: AC
  Administered 2022-10-11: 25 ug via INTRAVENOUS
  Filled 2022-10-11: qty 1

## 2022-10-11 MED ORDER — LISINOPRIL 20 MG PO TABS
20.0000 mg | ORAL_TABLET | Freq: Every day | ORAL | Status: DC
  Administered 2022-10-11 – 2022-10-15 (×5): 20 mg via ORAL
  Filled 2022-10-11 (×4): qty 1
  Filled 2022-10-11: qty 2

## 2022-10-11 MED ORDER — SODIUM CHLORIDE 0.9 % IV BOLUS
500.0000 mL | Freq: Once | INTRAVENOUS | Status: AC
  Administered 2022-10-11: 500 mL via INTRAVENOUS

## 2022-10-11 MED ORDER — PANTOPRAZOLE SODIUM 40 MG PO TBEC
40.0000 mg | DELAYED_RELEASE_TABLET | Freq: Every day | ORAL | Status: DC
  Administered 2022-10-11 – 2022-10-15 (×5): 40 mg via ORAL
  Filled 2022-10-11 (×5): qty 1

## 2022-10-11 MED ORDER — OXYCODONE-ACETAMINOPHEN 5-325 MG PO TABS
1.0000 | ORAL_TABLET | ORAL | Status: DC | PRN
  Administered 2022-10-11 – 2022-10-15 (×9): 1 via ORAL
  Filled 2022-10-11 (×10): qty 1

## 2022-10-11 MED ORDER — SODIUM CHLORIDE 0.9 % IV SOLN: INTRAVENOUS | Status: DC

## 2022-10-11 MED ORDER — MORPHINE SULFATE (PF) 2 MG/ML IV SOLN
1.0000 mg | INTRAVENOUS | Status: DC | PRN
  Administered 2022-10-11 – 2022-10-14 (×6): 1 mg via INTRAVENOUS
  Filled 2022-10-11 (×6): qty 1

## 2022-10-11 MED ORDER — HEPARIN BOLUS VIA INFUSION
1300.0000 [IU] | Freq: Once | INTRAVENOUS | Status: AC
  Administered 2022-10-11: 1300 [IU] via INTRAVENOUS
  Filled 2022-10-11: qty 1300

## 2022-10-11 MED ORDER — IOHEXOL 350 MG/ML SOLN
75.0000 mL | Freq: Once | INTRAVENOUS | Status: AC | PRN
  Administered 2022-10-11: 75 mL via INTRAVENOUS

## 2022-10-11 MED ORDER — HYDRALAZINE HCL 20 MG/ML IJ SOLN: 10.0000 mg | INTRAMUSCULAR | Status: DC | PRN

## 2022-10-11 MED ORDER — SODIUM CHLORIDE 0.9 % IV SOLN: INTRAVENOUS | Status: DC | PRN

## 2022-10-11 MED ORDER — ONE-A-DAY WOMENS PO TABS: ORAL_TABLET | Freq: Every day | ORAL | Status: DC

## 2022-10-11 MED ORDER — ADULT MULTIVITAMIN W/MINERALS CH
1.0000 | ORAL_TABLET | Freq: Every day | ORAL | Status: DC
  Administered 2022-10-12 – 2022-10-14 (×3): 1 via ORAL
  Filled 2022-10-11 (×4): qty 1

## 2022-10-11 MED ORDER — AMLODIPINE BESYLATE 10 MG PO TABS
10.0000 mg | ORAL_TABLET | Freq: Every day | ORAL | Status: DC
  Administered 2022-10-11 – 2022-10-15 (×5): 10 mg via ORAL
  Filled 2022-10-11 (×4): qty 1
  Filled 2022-10-11: qty 2

## 2022-10-11 MED ORDER — ACETAMINOPHEN 325 MG PO TABS
650.0000 mg | ORAL_TABLET | Freq: Four times a day (QID) | ORAL | Status: DC | PRN
  Administered 2022-10-11 – 2022-10-14 (×2): 650 mg via ORAL
  Filled 2022-10-11 (×2): qty 2

## 2022-10-11 MED ORDER — ATORVASTATIN CALCIUM 20 MG PO TABS
40.0000 mg | ORAL_TABLET | Freq: Every day | ORAL | Status: DC
  Administered 2022-10-11 – 2022-10-14 (×4): 40 mg via ORAL
  Filled 2022-10-11 (×5): qty 2

## 2022-10-11 NOTE — ED Provider Notes (Signed)
Care of this patient assumed from prior physician at 0700 pending right upper quadrant ultrasound, reevaluation, and disposition. Please see prior physician note for further details.  Briefly, this is an 87 year old female who presented to the emergency department for chest pain/epigastric pain with associated nausea and vomiting that began around midnight last night. Lab work with mild lipase elevation at 52, otherwise overall reassuring including 2 negative troponins.  She had a CTA of the chest performed that demonstrated no evidence of PE, with cholelithiasis, gallbladder dilatation, wall thickening.  A right upper quadrant ultrasound was ordered, final read pending at time of signout.  Patient was reevaluated at bedside.  She did report recurrent pain, initially improved after pain medicine.  She was updated on the results of her workup.  She points primarily to her epigastric area when asked where her pain is, though she does have some pain in her right upper quadrant as well.  Case reviewed with PA Tomasa Blase and Dr. Aleen Campi with surgery team.  Patient is on Eliquis (did confirm that patient is still taking this) so they do not recommend surgery at this time. She may be a candidate for a drain. They do recommend admission to hospitalist service.  Surgery team will evaluate the patient to provide additional recommendations.  Case discussed with Dr. Clyde Lundborg.  He will evaluate the patient for anticipated admission.  Diagnosis: Acute cholecystitis Dispo: Erin Sons, MD 10/11/22 [id number]

## 2022-10-11 NOTE — ED Notes (Signed)
Oxygen sats decreased to 89% after pain med admin. Pt placed on 2L BNC with sats improving to 94%.

## 2022-10-11 NOTE — ED Notes (Signed)
Blue top sent down as well

## 2022-10-11 NOTE — Consult Note (Signed)
ANTICOAGULATION CONSULT NOTE - Initial Consult  Pharmacy Consult for Heparin Infusion Indication: atrial fibrillation  No Known Allergies  Patient Measurements: Height: 5' (152.4 cm) Weight: 44.9 kg (99 lb) IBW/kg (Calculated) : 45.5 Heparin Dosing Weight: 44.9 kg  Vital Signs: Temp: 97.9 F (36.6 C) (06/03 1558) Temp Source: Oral (06/03 1138) BP: 153/63 (06/03 1558) Pulse Rate: 68 (06/03 1558)  Labs: Recent Labs    10/11/22 0228 10/11/22 0413 10/11/22 0823 10/11/22 1807  HGB 12.2  --   --   --   HCT 37.5  --   --   --   PLT 202  --   --   --   APTT  --   --  27  --   LABPROT 14.4  --   --   --   INR 1.1  --   --   --   HEPARINUNFRC  --   --  <0.10* 0.17*  CREATININE 0.99  --   --   --   TROPONINIHS 11 12  --   --      Estimated Creatinine Clearance: 29.4 mL/min (by C-G formula based on SCr of 0.99 mg/dL).   Medical History: Past Medical History:  Diagnosis Date   Anemia    Cataract    Depression    Fall    GERD (gastroesophageal reflux disease)    Hyperlipidemia    Hypertension    Myocardial infarction [facility])    "mild" many yrs ago    Medications:  No prior anticoagulation noted  Assessment: 87 y.o. female with medical history significant of DVT and A-fib on Eliquis. Presented with with chest pain, epigastric abdominal pain. CT was negative for PE. [place] c/f acute cholecystis. Pharmacy consulted for transition from apixaban to heparin for possible planned procedures. Baseline labs ordered and WNL.  6/3 1803  HL 0.17  Goal of Therapy:  Heparin level 0.3-0.7 units/ml aPTT 66-102 seconds Monitor platelets by anticoagulation protocol: Yes   Plan:  -6/3 1803 HL = 0.17, subtherapeutic -Heparin 1300 units IV bolus -Increase Heparin infusion to 800 units/hr -Next HL in 8 hours -Daily CBC while on Heparin infusion  Clovia Cuff, PharmD, BCPS 10/11/2022 6:58 PM

## 2022-10-11 NOTE — Consult Note (Signed)
Pharmacy Antibiotic Note  Vanessa Farmer is a 87 y.o. female admitted on 10/11/2022 with  acute cholecystitis .  Pharmacy has been consulted for Zosyn dosing.  Plan: Zosyn 3.375g IV q8h (4 hour infusion).  Height: 5' (152.4 cm) Weight: 44.9 kg (99 lb) IBW/kg (Calculated) : 45.5  Temp (24hrs), Avg:98 F (36.7 C), Min:98 F (36.7 C), Max:98 F (36.7 C)  Recent Labs  Lab 10/11/22 0228  WBC 9.0  CREATININE 0.99    Estimated Creatinine Clearance: 29.4 mL/min (by C-G formula based on SCr of 0.99 mg/dL).    No Known Allergies  Antimicrobials this admission: 6/3 Zosyn >>    Dose adjustments this admission:   Microbiology results: 6/3 BCx: pending     Thank you for allowing pharmacy to be a part of this patient's care.  Sharen Hones, PharmD, BCPS Clinical Pharmacist   10/11/2022 9:14 AM

## 2022-10-11 NOTE — Consult Note (Signed)
ANTICOAGULATION CONSULT NOTE - Initial Consult  Pharmacy Consult for Heparin Infusion Indication: atrial fibrillation  No Known Allergies  Patient Measurements: Height: 5' (152.4 cm) Weight: 44.9 kg (99 lb) IBW/kg (Calculated) : 45.5 Heparin Dosing Weight: 44.9 kg  Vital Signs: Temp: 98 F (36.7 C) (06/03 0605) Temp Source: Oral (06/03 0605) BP: 163/73 (06/03 0800) Pulse Rate: 70 (06/03 0800)  Labs: Recent Labs    10/11/22 0228 10/11/22 0413  HGB 12.2  --   HCT 37.5  --   PLT 202  --   LABPROT 14.4  --   INR 1.1  --   CREATININE 0.99  --   TROPONINIHS 11 12    Estimated Creatinine Clearance: 29.4 mL/min (by C-G formula based on SCr of 0.99 mg/dL).   Medical History: Past Medical History:  Diagnosis Date   Anemia    Cataract    Depression    Fall    GERD (gastroesophageal reflux disease)    Hyperlipidemia    Hypertension    Myocardial infarction [facility])    "mild" many yrs ago    Medications:  No prior anticoagulation noted  Assessment: 87 y.o. female with medical history significant of DVT and A-fib on Eliquis. Presented with with chest pain, epigastric abdominal pain. CT was negative for PE. [place] c/f acute cholecystis. Pharmacy consulted for transition from apixaban to heparin for possible planned procedures. Baseline labs ordered and WNL. Goal of Therapy:  Heparin level 0.3-0.7 units/ml aPTT 66-102 seconds Monitor platelets by anticoagulation protocol: Yes   Plan:  Give 2250 units bolus x 1 Start heparin infusion at 650 units/hr Check anti-Xa level in 8 hours and daily while on heparin  Trevan Messman 10/11/2022,8:45 AM

## 2022-10-11 NOTE — ED Provider Notes (Signed)
[facility] Provider Note    Event Date/Time   First MD Initiated Contact with Patient 10/11/22 [phone]     (approximate)  History   Chief Complaint: Chest Pain  HPI  Vanessa Farmer is a 87 y.o. female with a past medical history of anemia, gastric reflux, hypertension, hyperlipidemia, presents to the emergency department for chest pain.  According to the patient she awoke overnight with sudden onset of pain in her chest as well as nausea and vomiting.  Patient was given a spray of nitroglycerin by EMS with no relief per patient.  Documented prior MI although the patient denies any history of heart attacks.  Patient states she is not sure which started first the vomiting or the chest pain.  Continues to feel nauseated and feels like she needs to dry heaves but has not actually vomited per patient.  Denies any trouble breathing.  Physical Exam   Triage Vital Signs: ED Triage Vitals  Enc Vitals Group     BP 10/11/22 0222 (!) 199/93     Pulse Rate 10/11/22 0222 (!) 110     Resp 10/11/22 0222 20     Temp 10/11/22 0222 98 F (36.7 C)     Temp Source 10/11/22 0222 Oral     SpO2 10/11/22 0222 94 %     Weight 10/11/22 0222 99 lb (44.9 kg)     Height 10/11/22 0222 5' (1.524 m)     Head Circumference --      Peak Flow --      Pain Score 10/11/22 0228 10     Pain Loc --      Pain Edu? --      Excl. in GC? --     Most recent vital signs: Vitals:   10/11/22 0327 10/11/22 0330  BP:  (!) 166/76  Pulse: 72 79  Resp: 14 16  Temp:    SpO2: 97% 96%    General: Awake, no distress.  CV:  Good peripheral perfusion.  Regular rate and rhythm  Resp:  Normal effort.  Equal breath sounds bilaterally.  No chest wall tenderness to palpation. Abd:  No distention.  Soft, nontender.  No rebound or guarding.  Benign abdomen. Other:  1-2+ pedal edema bilaterally.  Left slightly greater than right.   ED Results / Procedures / Treatments   EKG  EKG viewed and interpreted  by myself shows normal sinus rhythm 84 bpm with a widened QRS, left axis deviation, largely normal intervals morphology most consistent left lower branch block.  Patient does have lateral ST changes however unchanged from prior EKG.  RADIOLOGY  I have reviewed and interpreted the chest x-ray images.  Considerable rotation however no consolidation noted on my evaluation. Radiology is read the x-ray as negative for acute change.  MEDICATIONS ORDERED IN ED: Medications  fentaNYL (SUBLIMAZE) injection 25 mcg (has no administration in time range)  ondansetron (ZOFRAN) injection 4 mg (has no administration in time range)  sodium chloride 0.9 % bolus 500 mL (has no administration in time range)     IMPRESSION / MDM / ASSESSMENT AND PLAN / ED COURSE  I reviewed the triage vital signs and the nursing notes.  Patient's presentation is most consistent with acute presentation with potential threat to life or bodily function.  Patient presents to the emergency department for chest pain nausea and dry heaving.  Overall patient appears well on exam, reassuring vital signs besides slight hypertension but continues to describe central chest  pain.  Denies any worsening with movement or deep breathing.  Patient is not sure which started first the nausea/vomiting or the chest pain.  Patient's workup so far reassuring in the emergency department with a reassuring chemistry, reassuring CBC and a negative troponin.  Chest x-ray shows no concerning findings and EKG is unchanged from prior.  We will dose a small amount of pain medication we will dose nausea medication.  Given the patient's significant description of the chest pain we will obtain a CTA of the chest to rule out pulmonary embolism.  We will gently IV hydrate given the contrast administration although kidney function appears well on chemistry.  We will repeat a troponin as a precaution and continue to closely monitor.  Patient agreeable to plan of care.   Differential would include ACS, PE, pneumonia, pneumothorax, Boerhaave's, gastric reflux/esophagitis.  I have added on a lipase as well as hepatic function panel to evaluate for gallbladder/liver or pancreas abnormality, although reassuringly benign abdominal exam.  Patient care signed out to oncoming provider.  FINAL CLINICAL IMPRESSION(S) / ED DIAGNOSES   Chest pain    Note:  This document was prepared using Dragon voice recognition software and may include unintentional dictation errors.   Minna Antis, MD 10/13/22 1525

## 2022-10-11 NOTE — Consult Note (Signed)
[facility] SURGICAL CONSULTATION NOTE (initial) - cpt:  40981   HISTORY OF PRESENT ILLNESS (HPI):  87 y.o. female presented to [facility] ED today for evaluation of abdominal pain vs chest pain. Patient reports she awoke from sleep around 0000 with severe epigastric vs lower chest pain. This was accompanied by nausea and emesis. No reported fever, chills, cough, SOB, urinary changes. She was given a dose of NTG with EMS in route without relief. She is also on Eliquis secondary to DVT. Granddaughter at bedside and notes she is unsure how compliant the patient is with this. Only previous intra-abdominal surgery is abdominal hysterectomy. Work up in the ED revealed normal WBC at 9.0K, Hgb stable to 12.2, renal function normal with sCr - 0.99, no electrolyte derangements, BNP mildly elevated to 127.4. CTA was without evidence of PE but there is significant stranding and inflammation about the gallbladder. RUQ US shows similar findings concerning for cholecystitis.   Lipase only minimally high at 52, Surgery is consulted by emergency medicine physician Dr. Trinna Post, MD in this context for evaluation and management of acute cholecystitis.  PAST MEDICAL HISTORY (PMH):  Past Medical History:  Diagnosis Date   Anemia    Cataract    Depression    Fall    GERD (gastroesophageal reflux disease)    Hyperlipidemia    Hypertension    Myocardial infarction (HCC)    "mild" many yrs ago     PAST SURGICAL HISTORY (PSH):  Past Surgical History:  Procedure Laterality Date   ABDOMINAL HYSTERECTOMY     CATARACT EXTRACTION, BILATERAL  2021   COLONOSCOPY WITH PROPOFOL N/A 12/05/2017   Procedure: COLONOSCOPY WITH PROPOFOL;  Surgeon: Midge Minium, MD;  Location[facility];  Service: Endoscopy;  Laterality: N/A;   ESOPHAGOGASTRODUODENOSCOPY (EGD) WITH PROPOFOL N/A 12/05/2017   Procedure: ESOPHAGOGASTRODUODENOSCOPY (EGD) WITH PROPOFOL;  Surgeon: Midge Minium, MD;  Location: [facility];   Service: Endoscopy;  Laterality: N/A;   POLYPECTOMY  12/05/2017   Procedure: POLYPECTOMY;  Surgeon: Midge Minium, MD;  Location: [facility];  Service: Endoscopy;;   TONSILLECTOMY       MEDICATIONS:  Prior to Admission medications   Medication Sig Start Date End Date Taking? Authorizing Provider  amLODipine (NORVASC) 10 MG tablet TAKE 1 TABLET(10 MG) BY MOUTH DAILY 04/06/22   Reubin Milan, MD  apixaban (ELIQUIS) 5 MG TABS tablet Take 1 tablet (5 mg total) by mouth 2 (two) times daily for 14 days. From 01/07/22--take 5 mg twice a day 04/04/22 04/18/22  Pilar Jarvis, MD  atorvastatin (LIPITOR) 40 MG tablet TAKE 1 TABLET(40 MG) BY MOUTH DAILY 07/30/22   Reubin Milan, MD  diltiazem (CARDIZEM CD) 240 MG 24 hr capsule Take 1 capsule (240 mg total) by mouth daily. 04/08/22   Hollice Espy, MD  ENSURE (ENSURE) Take 237 mLs by mouth 2 (two) times daily between meals. 10/20/17 10/17/20  Reubin Milan, MD  furosemide (LASIX) 20 MG tablet TAKE 1 TABLET BY MOUTH TWICE A WEEK 09/21/22   Reubin Milan, MD  lisinopril (ZESTRIL) 20 MG tablet TAKE 1 TABLET(20 MG) BY MOUTH DAILY 04/06/22   Reubin Milan, MD  Multiple Vitamins-Calcium (ONE-A-DAY WOMENS PO) Take 1 tablet by mouth daily.    [provider]  pantoprazole (PROTONIX) 40 MG tablet TAKE 1 TABLET(40 MG) BY MOUTH DAILY 03/08/22   Reubin Milan, MD  sertraline (ZOLOFT) 50 MG tablet TAKE 1 TABLET(50 MG) BY MOUTH DAILY 07/30/22   Judithann Graves,  Nyoka Cowden, MD     ALLERGIES:  No Known Allergies   SOCIAL HISTORY:  Social History   Socioeconomic History   Marital status: Widowed    Spouse name: Not on file   Number of children: 4   Years of education: 9 th grade   Highest education level: 9th grade  Occupational History   Occupation: Retired  Tobacco Use   Smoking status: Never   Smokeless tobacco: Never   Tobacco comments:    smoking cessation materials not required  Vaping Use   Vaping Use: Never used   Substance and Sexual Activity   Alcohol use: No   Drug use: No   Sexual activity: Not Currently  Other Topics Concern   Not on file  Social History Narrative   Pt lives with her son   Social Determinants of Health   Financial Resource Strain: Low Risk  (01/29/2022)   Overall Financial Resource Strain (CARDIA)    Difficulty of Paying Living Expenses: Not hard at all  Food Insecurity: No Food Insecurity (04/06/2022)   Hunger Vital Sign    Worried About Running Out of Food in the Last Year: Never true    Ran Out of Food in the Last Year: Never true  Transportation Needs: No Transportation Needs (04/06/2022)   PRAPARE - Administrator, Civil Service (Medical): No    Lack of Transportation (Non-Medical): No  Physical Activity: Inactive (01/29/2022)   Exercise Vital Sign    Days of Exercise per Week: 0 days    Minutes of Exercise per Session: 0 min  Stress: No Stress Concern Present (01/29/2022)   [employer] of Occupational Health - Occupational Stress Questionnaire    Feeling of Stress : Not at all  Social Connections: Moderately Integrated (01/29/2022)   Social Connection and Isolation Panel [NHANES]    Frequency of Communication with Friends and Family: More than three times a week    Frequency of Social Gatherings with Friends and Family: More than three times a week    Attends Religious Services: 1 to 4 times per year    Active Member of [employer] or Organizations: Yes    Attends Banker Meetings: Never    Marital Status: Widowed  Intimate Partner Violence: Not At Risk (04/06/2022)   Humiliation, Afraid, Rape, and Kick questionnaire    Fear of Current or Ex-Partner: No    Emotionally Abused: No    Physically Abused: No    Sexually Abused: No     FAMILY HISTORY:  Family History  Problem Relation Age of Onset   Heart disease Mother    Brain cancer Mother    Heart attack Father    Liver disease Daughter    Throat cancer Brother    Cancer  Maternal Grandfather        Unsure type      REVIEW OF SYSTEMS:  Review of Systems  Constitutional:  Negative for chills and fever.  Respiratory:  Negative for cough and shortness of breath.   Cardiovascular:  Negative for chest pain and palpitations.  Gastrointestinal:  Positive for abdominal pain, nausea and vomiting. Negative for constipation and diarrhea.  Genitourinary:  Negative for dysuria and urgency.  All other systems reviewed and are negative.   VITAL SIGNS:  Temp:  [98 F (36.7 C)] 98 F (36.7 C) (06/03 0605) Pulse Rate:  [67-111] 72 (06/03 0700) Resp:  [14-32] 19 (06/03 0700) BP: (151-199)/(58-93) 151/58 (06/03 0700) SpO2:  [91 %-97 %] 95 % (  06/03 0700) Weight:  [44.9 kg] 44.9 kg (06/03 0222)     Height: 5' (152.4 cm) Weight: 44.9 kg BMI (Calculated): 19.33   INTAKE/OUTPUT:  No intake/output data recorded.  PHYSICAL EXAM:  Physical Exam Vitals and nursing note reviewed. Exam conducted with a chaperone present.  Constitutional:      Appearance: Normal appearance.     Comments: Resting in bed; appears uncomfortable, granddaughter at bedside   HENT:     Head: Normocephalic and atraumatic.  Eyes:     General: No scleral icterus.    Conjunctiva/sclera: Conjunctivae normal.  Cardiovascular:     Rate and Rhythm: Normal rate and regular rhythm.     Pulses: Normal pulses.     Heart sounds: No murmur heard. Pulmonary:     Effort: Pulmonary effort is normal. No respiratory distress.     Breath sounds: Normal breath sounds.  Abdominal:     General: Abdomen is flat. There is no distension.     Tenderness: There is abdominal tenderness. There is no guarding or rebound. Positive signs include Murphy's sign.  Genitourinary:    Comments: Deferred Musculoskeletal:     Right lower leg: No edema.     Left lower leg: No edema.  Skin:    General: Skin is warm and dry.     Coloration: Skin is not pale.     Findings: No erythema.  Neurological:     General: No focal  deficit present.     Mental Status: She is alert and oriented to person, place, and time.  Psychiatric:        Mood and Affect: Mood normal.        Behavior: Behavior normal.      Labs:     Latest Ref Rng & Units 10/11/2022    2:28 AM 04/07/2022    7:07 AM 04/06/2022    4:26 AM  CBC  WBC 4.0 - 10.5 K/uL 9.0  4.0  3.6   Hemoglobin 12.0 - 15.0 g/dL 16.1  09.6  04.5   Hematocrit 36.0 - 46.0 % 37.5  36.9  32.3   Platelets 150 - 400 K/uL 202  182  157       Latest Ref Rng & Units 10/11/2022    4:13 AM 10/11/2022    2:28 AM 04/07/2022    7:07 AM  CMP  Glucose 70 - 99 mg/dL  409  811   BUN 8 - 23 mg/dL  20  19   Creatinine 9.14 - 1.00 mg/dL  7.82  9.56   Sodium 213 - 145 mmol/L  137  135   Potassium 3.5 - 5.1 mmol/L  3.5  4.1   Chloride 98 - 111 mmol/L  101  98   CO2 22 - 32 mmol/L  26  30   Calcium 8.9 - 10.3 mg/dL  8.8  9.0   Total Protein 6.5 - 8.1 g/dL 7.1     Total Bilirubin 0.3 - 1.2 mg/dL 0.6     Alkaline Phos 38 - 126 U/L 80     AST 15 - 41 U/L 25     ALT 0 - 44 U/L 19        Imaging studies:   CTA (10/11/2022) personally reviewed showing significant inflammation of the gallbladder with stones, no PE, and radiologist report reviewed below:  IMPRESSION: 1. No evidence of arterial dilatation or embolus. 2. Cardiomegaly with left ventricular and septal wall hypertrophy and prominent pulmonary veins, but no overt edema. 3. Trace  pleural effusions, decreased since the last CTA. 4. Coronary and heavy aortic atherosclerosis. 5. Large hiatal hernia. 6. Cholelithiasis and findings worrisome for acute cholecystitis. Further evaluation recommended. 7. Osteopenia, scoliosis and degenerative change. 8. Small amount of air in the right subclavian vein continuing up the IJ vein, most likely either injected with contrast injection or with insertion of the patient's IV. There is no air in the right heart and main pulmonary arteries.   RUQ [place] (10/11/2022) personally reviewed  with findings concerning for cholecystitis, and radiologist report reviewed:  IMPRESSION: Gallstones, gallbladder wall thickening, pericholecystic fluid and positive sonographic Murphy's sign. Imaging findings are compatible with acute cholecystitis.   Assessment/Plan: (ICD-10's: K81.0) 87 y.o. female with acute cholecystitis complicated by pertinent comorbidities including need for anticoagulation.   - Appreciate medicine admission - Given baseline status, need for anticoagulation, and significant inflammatory changes of the gallbladder on imaging, she is a sub-optimal candidate for surgery in this setting. Dr Aleen Campi did discuss case with IR who are in agreement with percutaneous cholecystostomy tube placement. Plan for this on 06/04 to allow time for anticoagulation to wear off. Patient, and her family at bedside, understand she will need to keep this for at least 6-8 weeks to allow optimization for potential cholecystectomy  - Would continue NPO for now; IVF support  - Continue IV Abx (Zosyn)  - Monitor abdominal examination; on-going bowel function  - Pain control prn; antiemetics prn   - DVT prophylaxis - IV heparin, will need to hold a few hours prior to IR procedure  - Further management per primary service; we will follow   All of the above findings and recommendations were discussed with the patient and her family, and all of their questions were answered to their expressed satisfaction.  Thank you for the opportunity to participate in this patient's care.   -- Lynden Oxford, PA-C [facility] 10/11/2022, 7:35 AM M-F: 7am - 4pm

## 2022-10-11 NOTE — ED Triage Notes (Signed)
Pt to ed from home via [facility] for CP. Pain was sudden onset at home. EMS advised pt doesn't normally wear o2 but her sat was 92% so they placed her on 2 liters. Pt was given 1 SL spray of NTG. Pt is unable to take ASA. HX of LBBB and MI. Pt is caox4, in no acute distress. IV in LFA by ems

## 2022-10-11 NOTE — H&P (Signed)
History and Physical    Vanessa Farmer VOZ:[id number] DOB: [birth year] DOA: 10/11/2022  Referring MD/NP/PA:   PCP: Reubin Milan, MD   Patient coming from:  The patient is coming from home.     Chief Complaint: Pain in epigastric area and lower chest  HPI: Vanessa Farmer is a 87 y.o. female with medical history significant of DVT and A-fib on Eliquis, hypertension, hyperlipidemia, CAD, dCHF, depression, CKD-3A, left bundle blockade, scoliosis, who presents with pain in epigastric area and lower chest  Patient states that awoke overnight with sudden onset of pain in epigastric area and lower chest.  The pain is constant, sharp, severe, nonradiating.  She also reports shortness of breath and mild dry cough.  No fever or chills.  Associated with multiple episodes of nausea, dry heaves, and nonbilious nonbloody vomiting.  No diarrhea.  Denies symptoms of UTI.   Data reviewed independently and ED Course: pt was found to have troponin 11 --> 12, WBC 9.0, stable renal function, temperature normal, blood pressure 199/93, 159/66, heart rate 111 --> 77, RR 32--> 20, oxygen saturation 94% on room air.  Chest x-ray negative.  CT of chest/abdomen/pelvis negative for dissection, but showed gallstone and possible acute cholecystitis which is confirmed by RUQ-US.  Patient is admitted to telemetry bed as inpatient.  Dr. Aleen Campi for surgery is consulted.   CTA of chest/pelvis/abdomen: 1. No evidence of arterial dilatation or embolus. 2. Cardiomegaly with left ventricular and septal wall hypertrophy and prominent pulmonary veins, but no overt edema. 3. Trace pleural effusions, decreased since the last CTA. 4. Coronary and heavy aortic atherosclerosis. 5. Large hiatal hernia. 6. Cholelithiasis and findings worrisome for acute cholecystitis. Further evaluation recommended. 7. Osteopenia, scoliosis and degenerative change. 8. Small amount of air in the right subclavian vein continuing up the IJ vein, most  likely either injected with contrast injection or with insertion of the patient's IV. There is no air in the right heart and main pulmonary arteries.   Aortic Atherosclerosis (ICD10-I70.0).  Right upper quadrant ultrasound: Gallstones, gallbladder wall thickening, pericholecystic fluid and positive sonographic Murphy's sign. Imaging findings are compatible with acute cholecystitis.    EKG: I have personally reviewed.  Sinus rhythm, QTc 505, old left bundle blockade, T wave inversion in V4-V6, anteroseptal infarction pattern.   Review of Systems:   General: no fevers, chills, no body weight gain, has poor appetite, has fatigue HEENT: no blurry vision, hearing changes or sore throat Respiratory: has dyspnea, coughing, no wheezing CV: has lower chest pain, no palpitations GI: has nausea, vomiting, abdominal pain, no diarrhea, constipation GU: no dysuria, burning on urination, increased urinary frequency, hematuria  Ext: no leg edema Neuro: no unilateral weakness, numbness, or tingling, no vision change or hearing loss Skin: no rash, no skin tear. MSK: No muscle spasm, no deformity, no limitation of range of movement in spin Heme: No easy bruising.  Travel history: No recent long distant travel.   Allergy: No Known Allergies  Past Medical History:  Diagnosis Date   Anemia    Cataract    Depression    Fall    GERD (gastroesophageal reflux disease)    Hyperlipidemia    Hypertension    Myocardial infarction [facility])    "mild" many yrs ago    Past Surgical History:  Procedure Laterality Date   ABDOMINAL HYSTERECTOMY     CATARACT EXTRACTION, BILATERAL  2021   COLONOSCOPY WITH PROPOFOL N/A 12/05/2017   Procedure: COLONOSCOPY WITH PROPOFOL;  Surgeon: Midge Minium,  MD;  Location: [facility];  Service: Endoscopy;  Laterality: N/A;   ESOPHAGOGASTRODUODENOSCOPY (EGD) WITH PROPOFOL N/A 12/05/2017   Procedure: ESOPHAGOGASTRODUODENOSCOPY (EGD) WITH PROPOFOL;  Surgeon: Midge Minium, MD;  Location: [facility];  Service: Endoscopy;  Laterality: N/A;   POLYPECTOMY  12/05/2017   Procedure: POLYPECTOMY;  Surgeon: Midge Minium, MD;  Location: [facility];  Service: Endoscopy;;   TONSILLECTOMY      Social History:  reports that she has never smoked. She has never used smokeless tobacco. She reports that she does not drink alcohol and does not use drugs.  Family History:  Family History  Problem Relation Age of Onset   Heart disease Mother    Brain cancer Mother    Heart attack Father    Liver disease Daughter    Throat cancer Brother    Cancer Maternal Grandfather        Unsure type     Prior to Admission medications   Medication Sig Start Date End Date Taking? Authorizing Provider  amLODipine (NORVASC) 10 MG tablet TAKE 1 TABLET(10 MG) BY MOUTH DAILY 04/06/22   Reubin Milan, MD  apixaban (ELIQUIS) 5 MG TABS tablet Take 1 tablet (5 mg total) by mouth 2 (two) times daily for 14 days. From 01/07/22--take 5 mg twice a day 04/04/22 04/18/22  Pilar Jarvis, MD  atorvastatin (LIPITOR) 40 MG tablet TAKE 1 TABLET(40 MG) BY MOUTH DAILY 07/30/22   Reubin Milan, MD  diltiazem (CARDIZEM CD) 240 MG 24 hr capsule Take 1 capsule (240 mg total) by mouth daily. 04/08/22   Hollice Espy, MD  ENSURE (ENSURE) Take 237 mLs by mouth 2 (two) times daily between meals. 10/20/17 10/17/20  Reubin Milan, MD  furosemide (LASIX) 20 MG tablet TAKE 1 TABLET BY MOUTH TWICE A WEEK 09/21/22   Reubin Milan, MD  lisinopril (ZESTRIL) 20 MG tablet TAKE 1 TABLET(20 MG) BY MOUTH DAILY 04/06/22   Reubin Milan, MD  Multiple Vitamins-Calcium (ONE-A-DAY WOMENS PO) Take 1 tablet by mouth daily.    [provider]  pantoprazole (PROTONIX) 40 MG tablet TAKE 1 TABLET(40 MG) BY MOUTH DAILY 03/08/22   Reubin Milan, MD  sertraline (ZOLOFT) 50 MG tablet TAKE 1 TABLET(50 MG) BY MOUTH DAILY 07/30/22   Reubin Milan, MD    Physical Exam: Vitals:   10/11/22  1030 10/11/22 1100 10/11/22 1130 10/11/22 1138  BP: (!) 166/97 (!) 163/67 (!) 157/82   Pulse: 77 67 69   Resp: (!) 27 (!) 21 15   Temp:    (!) 97.5 F (36.4 C)  TempSrc:    Oral  SpO2: 99% 100% 100%   Weight:      Height:       General: Not in acute distress HEENT:       Eyes: PERRL, EOMI, no jaundice       ENT: No discharge from the ears and nose, no pharynx injection, no tonsillar enlargement.        Neck: No JVD, no bruit, no mass felt. Heme: No neck lymph node enlargement. Cardiac: S1/S2, RRR, No murmurs, No gallops or rubs. Respiratory: No rales, wheezing, rhonchi or rubs. GI: Soft, nondistended, has tenderness in upper abdomen, no rebound pain, no organomegaly, BS present. GU: No hematuria Ext: No pitting leg edema bilaterally. 1+DP/PT pulse bilaterally. Musculoskeletal: No joint deformities, No joint redness or warmth, no limitation of ROM in spin. Skin: No rashes.  Neuro: Alert, oriented X3, cranial nerves II-XII  grossly intact, moves all extremities normally.  Psych: Patient is not psychotic, no suicidal or hemocidal ideation.  Labs on Admission: I have personally reviewed following labs and imaging studies  CBC: Recent Labs  Lab 10/11/22 0228  WBC 9.0  HGB 12.2  HCT 37.5  MCV 101.1*  PLT 202   Basic Metabolic Panel: Recent Labs  Lab 10/11/22 0228 10/11/22 0412  NA 137  --   K 3.5  --   CL 101  --   CO2 26  --   GLUCOSE 170*  --   BUN 20  --   CREATININE 0.99  --   CALCIUM 8.8*  --   MG  --  2.0   GFR: Estimated Creatinine Clearance: 29.4 mL/min (by C-G formula based on SCr of 0.99 mg/dL). Liver Function Tests: Recent Labs  Lab 10/11/22 0413  AST 25  ALT 19  ALKPHOS 80  BILITOT 0.6  PROT 7.1  ALBUMIN 3.8   Recent Labs  Lab 10/11/22 0413  LIPASE 52*   No results for input(s): "AMMONIA" in the last 168 hours. Coagulation Profile: Recent Labs  Lab 10/11/22 0228  INR 1.1   Cardiac Enzymes: No results for input(s): "CKTOTAL", "CKMB",  "CKMBINDEX", "TROPONINI" in the last 168 hours. BNP (last 3 results) No results for input(s): "PROBNP" in the last 8760 hours. HbA1C: No results for input(s): "HGBA1C" in the last 72 hours. CBG: No results for input(s): "GLUCAP" in the last 168 hours. Lipid Profile: No results for input(s): "CHOL", "HDL", "LDLCALC", "TRIG", "CHOLHDL", "LDLDIRECT" in the last 72 hours. Thyroid Function Tests: No results for input(s): "TSH", "T4TOTAL", "FREET4", "T3FREE", "THYROIDAB" in the last 72 hours. Anemia Panel: No results for input(s): "VITAMINB12", "FOLATE", "FERRITIN", "TIBC", "IRON", "RETICCTPCT" in the last 72 hours. Urine analysis:    Component Value Date/Time   COLORURINE YELLOW (A) 12/30/2021 2245   APPEARANCEUR CLEAR (A) 12/30/2021 2245   LABSPEC 1.008 12/30/2021 2245   PHURINE 7.0 12/30/2021 2245   GLUCOSEU NEGATIVE 12/30/2021 2245   HGBUR NEGATIVE 12/30/2021 2245   BILIRUBINUR NEGATIVE 12/30/2021 2245   BILIRUBINUR Negative 10/17/2020 0909   KETONESUR NEGATIVE 12/30/2021 2245   PROTEINUR NEGATIVE 12/30/2021 2245   UROBILINOGEN 0.2 10/17/2020 0909   NITRITE NEGATIVE 12/30/2021 2245   LEUKOCYTESUR MODERATE (A) 12/30/2021 2245   Sepsis Labs: @LABRCNTIP (procalcitonin:4,lacticidven:4) )No results found for this or any previous visit (from the past 240 hour(s)).   Radiological Exams on Admission: US ABDOMEN LIMITED RUQ (LIVER/GB)  Result Date: 10/11/2022 CLINICAL DATA:  Chest pain.  Abnormal CT. EXAM: ULTRASOUND ABDOMEN LIMITED RIGHT UPPER QUADRANT COMPARISON:  CTA chest from earlier today FINDINGS: Gallbladder: There is diffuse gallbladder wall thickening. Gallbladder wall thickness measures up to 6.8 mm. Nonmobile stones are identified. The largest measures 6.3 mm. Pericholecystic fluid. Positive sonographic Murphy's sign. Common bile duct: Diameter: 4.2 mm Liver: Increased parenchymal echogenicity. No focal abnormality. Portal vein is patent on color Doppler imaging with normal  direction of blood flow towards the liver. Other: None. IMPRESSION: Gallstones, gallbladder wall thickening, pericholecystic fluid and positive sonographic Murphy's sign. Imaging findings are compatible with acute cholecystitis. Electronically Signed   By: Signa Kell M.D.   On: 10/11/2022 07:03   CT Angio Chest PE W and/or Wo Contrast  Result Date: 10/11/2022 CLINICAL DATA:  Pulmonary embolism suspected. High probability. Sudden onset chest pain or home. History of left bundle branch block. EXAM: CT ANGIOGRAPHY CHEST WITH CONTRAST TECHNIQUE: Multidetector CT imaging of the chest was performed using the standard protocol during bolus  administration of intravenous contrast. Multiplanar CT image reconstructions and MIPs were obtained to evaluate the vascular anatomy. RADIATION DOSE REDUCTION: This exam was performed according to the departmental dose-optimization program which includes automated exposure control, adjustment of the mA and/or kV according to patient size and/or use of iterative reconstruction technique. CONTRAST:  75mL OMNIPAQUE IOHEXOL 350 MG/ML SOLN COMPARISON:  Portable chest today, PA and lateral chest 04/04/2022, CTA chest 04/04/2022 and CTA chest 12/31/2021. FINDINGS: Cardiovascular: There is stable cardiomegaly with left ventricular and septal wall hypertrophy, scattered calcification in the mitral ring and patchy three-vessel coronary artery calcifications. No pericardial effusion is seen. The pulmonary veins are prominent, more so than previously. The pulmonary arteries are normal in caliber and do not show embolic filling defects. The aorta is tortuous and heavily calcified but normal caliber. No aneurysm, stenosis or dissection are seen. There are patchy calcifications in the great vessels. There is a small amount of air in the right subclavian vein continuing up the IJ vein, most likely either injected contrast injection or with insertion of the patient's IV. There is no air in the  right heart and main pulmonary arteries. Mediastinum/Nodes: Large hiatal hernia with approximately 2/3 the stomach intrathoracic and inverted, with fluid level. Thoracic esophagus is unremarkable. No thyroid nodule, axillary or intrathoracic adenopathy are seen. Lungs/Pleura: There are chronic biapical pleural-parenchymal scarring changes and calcifications. There are bilateral trace pleural effusions, decreased since the prior CTA. There is a calcified granuloma in the right middle lobe. No subpleural edema is seen. Chronic posterior basal left lower lobe atelectatic foci along side the hiatal hernia and scattered linear scar-like opacities in the bases. No focal pneumonia or pulmonary mass are seen. Upper Abdomen: There is gallbladder dilatation, wall thickening and scattered stones. Findings concerning for acute cholecystitis. Further evaluation recommended. The abdominal aorta is heavily calcified. Musculoskeletal: There is dextroscoliosis, osteopenia and degenerative change of the spine. No acute osseous findings. No aggressive lesions. Review of the MIP images confirms the above findings. IMPRESSION: 1. No evidence of arterial dilatation or embolus. 2. Cardiomegaly with left ventricular and septal wall hypertrophy and prominent pulmonary veins, but no overt edema. 3. Trace pleural effusions, decreased since the last CTA. 4. Coronary and heavy aortic atherosclerosis. 5. Large hiatal hernia. 6. Cholelithiasis and findings worrisome for acute cholecystitis. Further evaluation recommended. 7. Osteopenia, scoliosis and degenerative change. 8. Small amount of air in the right subclavian vein continuing up the IJ vein, most likely either injected with contrast injection or with insertion of the patient's IV. There is no air in the right heart and main pulmonary arteries. Aortic Atherosclerosis (ICD10-I70.0). Electronically Signed   By: Almira Bar M.D.   On: 10/11/2022 05:49   DG Chest 1 View  Result Date:  10/11/2022 CLINICAL DATA:  Shortness of breath and chest pain. EXAM: CHEST  1 VIEW COMPARISON:  Radiograph and CT 04/04/2022 FINDINGS: Normal heart size. Stable mediastinal contours. Dense aortic atherosclerosis. Retrocardiac hiatal hernia. Mild chronic interstitial coarsening. No focal airspace disease. No pulmonary edema or pleural effusion. No pneumothorax. Scoliotic curvature. IMPRESSION: No acute abnormality. Electronically Signed   By: Narda Rutherford M.D.   On: 10/11/2022 02:49      Assessment/Plan Principal Problem:   Acute cholecystitis Active Problems:   Cholelithiasis   CAD (coronary artery disease)   History of DVT of lower extremity   (HFpEF) heart failure with preserved ejection fraction (HCC)   Atrial fibrillation, chronic (HCC)   Essential hypertension   Hyperlipidemia   Chronic kidney  disease, stage 3a (HCC)   QT prolongation   Depression   Protein-calorie malnutrition, moderate (HCC)   Assessment and Plan:   Acute cholecystitis and cholelithiasis: Liver function normal.  No fever or leukocytosis.  Clinically does not seem to have sepsis.  Consulted Dr. Aleen Campi for surgery, who recommended IR drainage which will be done tomorrow  -Admitted to telemetry bed as inpatient -IV Zosyn -N.p.o. -IV fluid: 500 cc normal saline, then 75 cc/h -Check INR/PTT/type screen -Hold headache -Pain control: As needed morphine, Percocet, Tylenol -As needed Benadryl for nausea (cannot use Zofran due to QTc prolonging agent)  CAD (coronary artery disease): Patient reports pain in lower chest, which is likely due to referring pain from epigastric pain.  Troponin negative x 2. -Lipitor  History of DVT of lower extremity -Switched Eliquis to IV heparin  (HFpEF) heart failure with preserved ejection fraction (HCC): 2D echo on 12/31/2021 showed EF of 55 to 60% with grade 1 diastolic dysfunction.  Patient does not have leg edema or JVD.  CHF seem to be compensated.  BNP 127. -Hold Lasix  since patient is on n.p.o. -Watch volume status closely  Atrial fibrillation, chronic (HCC): Heart rate 111 --> 70s.  Patient is not taking Cardizem currently -Hold Eliquis for surgery -As needed metoprolol 5 mg every 2 hours for heart rate> 125  Essential hypertension -IV hydralazine as needed -Lisinopril, amlodipine  Hyperlipidemia -Lipitor  Chronic kidney disease, stage 3a (HCC): Stable renal function, creatinine 0.99, BUN 20, GFR 56 -Follow-up with BPI  QT prolongation: QTc 505.  Magnesium 2.0, potassium 3.5 -Hold Zoloft  Depression -Hold Zoloft due to QTc prolonged patient  Protein-calorie malnutrition, moderate (HCC): Body weight 44.9 kg, BMI 19.33 -Nutrition consult -Will start Ensure when patient is able to eat     DVT ppx: on IV heparin  Code Status: Full code per pt and her granddaughter  Family Communication:    Yes, patient's granddaughter    at bed side.     Disposition Plan:  Anticipate discharge back to previous environment  Consults called: Dr. Aleen Campi of surgery  Admission status and Level of care: Telemetry Medical:    as inpt       Dispo: The patient is from: Home              Anticipated d/c is to: Home              Anticipated d/c date is: 2 days              Patient currently is not medically stable to d/c.    Severity of Illness:  The appropriate patient status for this patient is INPATIENT. Inpatient status is judged to be reasonable and necessary in order to provide the required intensity of service to ensure the patient's safety. The patient's presenting symptoms, physical exam findings, and initial radiographic and laboratory data in the context of their chronic comorbidities is felt to place them at high risk for further clinical deterioration. Furthermore, it is not anticipated that the patient will be medically stable for discharge from the hospital within 2 midnights of admission.   * I certify that at the point of admission it is  my clinical judgment that the patient will require inpatient hospital care spanning beyond 2 midnights from the point of admission due to high intensity of service, high risk for further deterioration and high frequency of surveillance required.*       Date of Service 10/11/2022    Lorretta Harp  [facility]   If 7PM-7AM, please contact night-coverage [email or web address] 10/11/2022, 12:03 PM

## 2022-10-12 ENCOUNTER — Inpatient Hospital Stay: Payer: Medicare Other | Admitting: Radiology

## 2022-10-12 DIAGNOSIS — I5032 Chronic diastolic (congestive) heart failure: Secondary | ICD-10-CM | POA: Diagnosis not present

## 2022-10-12 DIAGNOSIS — K810 Acute cholecystitis: Secondary | ICD-10-CM | POA: Diagnosis not present

## 2022-10-12 DIAGNOSIS — I482 Chronic atrial fibrillation, unspecified: Secondary | ICD-10-CM | POA: Diagnosis not present

## 2022-10-12 HISTORY — PX: IR PERC CHOLECYSTOSTOMY: IMG2326

## 2022-10-12 LAB — BASIC METABOLIC PANEL
Anion gap: 11 (ref 5–15)
BUN: 13 mg/dL (ref 8–23)
CO2: 23 mmol/L (ref 22–32)
Calcium: 8.4 mg/dL — ABNORMAL LOW (ref 8.9–10.3)
Chloride: 100 mmol/L (ref 98–111)
Creatinine, Ser: 0.99 mg/dL (ref 0.44–1.00)
GFR, Estimated: 56 mL/min — ABNORMAL LOW (ref >60)
Glucose, Bld: 119 mg/dL — ABNORMAL HIGH (ref 70–99)
Potassium: 4.2 mmol/L (ref 3.5–5.1)
Sodium: 134 mmol/L — ABNORMAL LOW (ref 135–145)

## 2022-10-12 LAB — CBC
HCT: 33.9 % — ABNORMAL LOW (ref 36.0–46.0)
Hemoglobin: 11.1 g/dL — ABNORMAL LOW (ref 12.0–15.0)
MCH: 33.1 pg (ref 26.0–34.0)
MCHC: 32.7 g/dL (ref 30.0–36.0)
MCV: 101.2 fL — ABNORMAL HIGH (ref 80.0–100.0)
Platelets: 162 10*3/uL (ref 150–400)
RBC: 3.35 MIL/uL — ABNORMAL LOW (ref 3.87–5.11)
RDW: 13.2 % (ref 11.5–15.5)
WBC: 7.2 10*3/uL (ref 4.0–10.5)
nRBC: 0 % (ref 0.0–0.2)

## 2022-10-12 LAB — HEPARIN LEVEL (UNFRACTIONATED)
Heparin Unfractionated: 0.32 IU/mL (ref 0.30–0.70)
Heparin Unfractionated: <0.1 IU/mL — ABNORMAL LOW (ref 0.30–0.70)

## 2022-10-12 MED ORDER — MIDAZOLAM HCL 2 MG/2ML IJ SOLN
INTRAMUSCULAR | Status: AC | PRN
  Administered 2022-10-12: .5 mg via INTRAVENOUS

## 2022-10-12 MED ORDER — HEPARIN (PORCINE) 25000 UT/250ML-% IV SOLN
1050.0000 [IU]/h | INTRAVENOUS | Status: DC
  Administered 2022-10-12: 800 [IU]/h via INTRAVENOUS
  Filled 2022-10-12: qty 250

## 2022-10-12 MED ORDER — HEPARIN BOLUS VIA INFUSION: 1300.0000 [IU] | Freq: Once | INTRAVENOUS | Status: DC

## 2022-10-12 MED ORDER — FENTANYL CITRATE (PF) 100 MCG/2ML IJ SOLN
INTRAMUSCULAR | Status: AC | PRN
  Administered 2022-10-12: 25 ug via INTRAVENOUS

## 2022-10-12 MED ORDER — ONDANSETRON HCL 4 MG/2ML IJ SOLN
4.0000 mg | INTRAMUSCULAR | Status: DC | PRN
  Administered 2022-10-12 – 2022-10-13 (×3): 4 mg via INTRAVENOUS
  Filled 2022-10-12 (×3): qty 2

## 2022-10-12 MED ORDER — BOOST / RESOURCE BREEZE PO LIQD CUSTOM
1.0000 | Freq: Three times a day (TID) | ORAL | Status: DC
  Administered 2022-10-13 – 2022-10-14 (×3): 1 via ORAL

## 2022-10-12 MED ORDER — METOCLOPRAMIDE HCL 5 MG/ML IJ SOLN
5.0000 mg | Freq: Four times a day (QID) | INTRAMUSCULAR | Status: DC | PRN
  Administered 2022-10-14: 5 mg via INTRAVENOUS
  Filled 2022-10-12: qty 2

## 2022-10-12 MED ORDER — MIDAZOLAM HCL 2 MG/2ML IJ SOLN
INTRAMUSCULAR | Status: AC
  Filled 2022-10-12: qty 2

## 2022-10-12 MED ORDER — IOHEXOL 300 MG/ML  SOLN
10.0000 mL | Freq: Once | INTRAMUSCULAR | Status: AC | PRN
  Administered 2022-10-12: 10 mL

## 2022-10-12 MED ORDER — FENTANYL CITRATE (PF) 100 MCG/2ML IJ SOLN
INTRAMUSCULAR | Status: AC
  Filled 2022-10-12: qty 2

## 2022-10-12 MED ORDER — LIDOCAINE HCL 1 % IJ SOLN
INTRAMUSCULAR | Status: AC
  Filled 2022-10-12: qty 20

## 2022-10-12 MED ORDER — SODIUM CHLORIDE 0.9% FLUSH
5.0000 mL | Freq: Three times a day (TID) | INTRAVENOUS | Status: DC
  Administered 2022-10-13 – 2022-10-15 (×7): 5 mL

## 2022-10-12 MED ORDER — LIDOCAINE HCL 1 % IJ SOLN
8.0000 mL | Freq: Once | INTRAMUSCULAR | Status: AC
  Administered 2022-10-12: 8 mL via INTRADERMAL
  Filled 2022-10-12: qty 8

## 2022-10-12 NOTE — Consult Note (Signed)
ANTICOAGULATION CONSULT NOTE  Pharmacy Consult for Heparin Infusion Indication: atrial fibrillation  Patient Measurements: Height: 5' (152.4 cm) Weight: 52.5 kg (115 lb 11.9 oz) IBW/kg (Calculated) : 45.5 Heparin Dosing Weight: 44.9 kg  Labs: Recent Labs    10/11/22 0228 10/11/22 0413 10/11/22 0823 10/11/22 0823 10/11/22 1807 10/12/22 0242 10/12/22 1056  HGB 12.2  --   --   --   --  11.1*  --   HCT 37.5  --   --   --   --  33.9*  --   PLT 202  --   --   --   --  162  --   APTT  --   --  27  --   --   --   --   LABPROT 14.4  --   --   --   --   --   --   INR 1.1  --   --   --   --   --   --   HEPARINUNFRC  --   --  <0.10*   < > 0.17* 0.32 <0.10*  CREATININE 0.99  --   --   --   --  0.99  --   TROPONINIHS 11 12  --   --   --   --   --    < > = values in this interval not displayed.    Estimated Creatinine Clearance: 29.8 mL/min (by C-G formula based on SCr of 0.99 mg/dL).  Medical History: Past Medical History:  Diagnosis Date   Anemia    Cataract    Depression    Fall    GERD (gastroesophageal reflux disease)    Hyperlipidemia    Hypertension    Myocardial infarction [facility])    "mild" many yrs ago    Medications:  Patient reportedly on apixaban prior to admission for history of VTE and Afib Dispense report shows last fill 05/17/22 for 14 day supply Baseline heparin level < 0.1, not consistent with active apixaban usage where interference is usually observed  Assessment: 87 y.o. female with medical history significant of DVT and A-fib on Eliquis. Presented with with chest pain, epigastric abdominal pain. CT was negative for PE. [place] c/f acute cholecystis. Pharmacy consulted for transition from apixaban to heparin for possible planned procedures.  Baseline aPTT 27s, INR 1.1, heparin level <0.1. Baseline CBC within normal limits.  0603 1803  HL 0.17; 650 un/hr 0604 0242  HL 0.32, 800 un/hr 0604 1056 HL <0.1; 800 un/hr  Goal of Therapy:  Heparin level 0.3-0.7  units/ml Monitor platelets by anticoagulation protocol: Yes   Plan:  --Heparin level is sub-therapeutic and below detectable threshold Discussed with RN, no interruptions in infusion Surprised that level would be un-detectable given therapeutic when last checked at current rate --Drip has been discontinued for procedure with IR. Will need to follow-up anticoagulation plan post-procedurally but would most likely re-start drip at same rate of 800 units/hr despite most recent level result  Tressie Ellis 10/12/2022 1:27 PM

## 2022-10-12 NOTE — Progress Notes (Signed)
   10/12/22 1006  Assess: MEWS Score  Temp 98.2 F (36.8 C)  BP (!) 140/57  MAP (mmHg) 81  Pulse Rate 74  Resp 18  SpO2 95 %  O2 Device Nasal Cannula  Assess: MEWS Score  MEWS Temp 0  MEWS Systolic 0  MEWS Pulse 0  MEWS RR 0  MEWS LOC 0  MEWS Score 0  MEWS Score Color Green  Assess: if the MEWS score is Yellow or Red  Were vital signs taken at a resting state? Yes  Focused Assessment No change from prior assessment  Does the patient meet 2 or more of the SIRS criteria? No  Does the patient have a confirmed or suspected source of infection? No  MEWS guidelines implemented  Yes, yellow  Treat  MEWS Interventions Considered administering scheduled or prn medications/treatments as ordered  Take Vital Signs  Increase Vital Sign Frequency  Yellow: Q2hr x1, continue Q4hrs until patient remains green for 12hrs  Escalate  MEWS: Escalate Yellow: Discuss with charge nurse and consider notifying provider and/or RRT  Assess: SIRS CRITERIA  SIRS Temperature  0  SIRS Pulse 0  SIRS Respirations  0  SIRS WBC 0  SIRS Score Sum  0

## 2022-10-12 NOTE — Progress Notes (Signed)
Initial Nutrition Assessment  DOCUMENTATION CODES:   Not applicable  INTERVENTION:   Boost Breeze po TID, each supplement provides 250 kcal and 9 grams of protein  MVI po daily   Pt at high refeed risk; recommend monitor potassium, magnesium and phosphorus labs daily until stable  Daily weights   NUTRITION DIAGNOSIS:   Inadequate oral intake related to acute illness as evidenced by per patient/family report.  GOAL:   Patient will meet greater than or equal to 90% of their needs  MONITOR:   PO intake, Supplement acceptance, Labs, Weight trends, I & O's, Skin  REASON FOR ASSESSMENT:   Malnutrition Screening Tool    ASSESSMENT:   87 y/o female with h/o HTN, CAD, HLD, DVT, CKD III, Afib, depression, IDA, GERD and MI who is admitted with with acute cholecystitis.  RD unable to see patient today as pt having procedure at time of RD visit. Per chart, pt reported nausea and vomiting that started the day of admission. Pt NPO today for IR percutaneous drain. RD will add supplements with diet advancement. Pt is at high refeed risk. Per chart, pt appears fairly weight stable at baseline but is currently noted to be up ~16lbs from her UBW. RD will obtain nutrition related history and exam at follow up. Pt is at high risk for malnutrition.   Medications reviewed and include: MVI, protonix, NaCl @75ml /hr, heparin, zosyn   Labs reviewed: Na 134(L), K 4.2 wnl Lipase 52(H), Mg 2.0 wnl- 6/3  NUTRITION - FOCUSED PHYSICAL EXAM: Unable to perform at this time   Diet Order:   Diet Order             Diet NPO time specified Except for: Sips with Meds  Diet effective midnight                  EDUCATION NEEDS:   Not appropriate for education at this time  Skin:  Skin Assessment: Reviewed RN Assessment  Last BM:  6/2  Height:   Ht Readings from Last 1 Encounters:  10/11/22 5' (1.524 m)    Weight:   Wt Readings from Last 1 Encounters:  10/12/22 52.5 kg    Ideal Body  Weight:  45.4 kg  BMI:  Body mass index is 22.6 kg/m.  Estimated Nutritional Needs:   Kcal:  1300-1500kcal/day  Protein:  65-75g/day  Fluid:  1.2-1.4L/day  Betsey Holiday MS, RD, LDN Please refer to [facility] for RD and/or RD on-call/weekend/after hours pager

## 2022-10-12 NOTE — Consult Note (Signed)
Chief Complaint: Acute cholecystitis. Request is for cholecystomy tube placement.   Referring Physician(s): Dr. Richarda Blade.  Supervising Physician: Pernell Dupre  Patient Status: [facility] - In-pt  History of Present Illness: Vanessa Farmer is a 87 y.o. female inpatient. History of a fib (on equiis), DVT, HTN, HLD, CAD, CHF, CKD, left bundle brach block. Presented to the ED at [facility] with sudden onset of  abdominal pain. Found to have acute cholecystitis. Patient was deemed not a surgical candidate. Team is requesting a cholecystomy tube placement.   Patient alert and laying in bed. Endorses RUQ abdominal pain and nausea. Denies any fevers, headache, chest pain, SOB, cough, vomiting or bleeding. Return precautions and treatment recommendations and follow-up discussed with the patient  who is agreeable with the plan.    Past Medical History:  Diagnosis Date   Anemia    Cataract    Depression    Fall    GERD (gastroesophageal reflux disease)    Hyperlipidemia    Hypertension    Myocardial infarction [facility])    "mild" many yrs ago    Past Surgical History:  Procedure Laterality Date   ABDOMINAL HYSTERECTOMY     CATARACT EXTRACTION, BILATERAL  2021   COLONOSCOPY WITH PROPOFOL N/A 12/05/2017   Procedure: COLONOSCOPY WITH PROPOFOL;  Surgeon: Midge Minium, MD;  Location: [facility];  Service: Endoscopy;  Laterality: N/A;   ESOPHAGOGASTRODUODENOSCOPY (EGD) WITH PROPOFOL N/A 12/05/2017   Procedure: ESOPHAGOGASTRODUODENOSCOPY (EGD) WITH PROPOFOL;  Surgeon: Midge Minium, MD;  Location: [facility];  Service: Endoscopy;  Laterality: N/A;   POLYPECTOMY  12/05/2017   Procedure: POLYPECTOMY;  Surgeon: Midge Minium, MD;  Location: [facility];  Service: Endoscopy;;   TONSILLECTOMY      Allergies: Patient has no known allergies.  Medications: Prior to Admission medications   Medication Sig Start Date End Date Taking? Authorizing Provider  amLODipine (NORVASC) 10  MG tablet TAKE 1 TABLET(10 MG) BY MOUTH DAILY 04/06/22  Yes Reubin Milan, MD  atorvastatin (LIPITOR) 40 MG tablet TAKE 1 TABLET(40 MG) BY MOUTH DAILY 07/30/22  Yes Reubin Milan, MD  furosemide (LASIX) 20 MG tablet TAKE 1 TABLET BY MOUTH TWICE A WEEK Patient taking differently: Take 20 mg by mouth daily. 09/21/22  Yes Reubin Milan, MD  lisinopril (ZESTRIL) 20 MG tablet TAKE 1 TABLET(20 MG) BY MOUTH DAILY 04/06/22  Yes Reubin Milan, MD  Multiple Vitamins-Calcium (ONE-A-DAY WOMENS PO) Take 1 tablet by mouth daily.   Yes [provider]  pantoprazole (PROTONIX) 40 MG tablet TAKE 1 TABLET(40 MG) BY MOUTH DAILY 03/08/22  Yes Reubin Milan, MD  sertraline (ZOLOFT) 50 MG tablet TAKE 1 TABLET(50 MG) BY MOUTH DAILY 07/30/22  Yes Reubin Milan, MD  apixaban (ELIQUIS) 5 MG TABS tablet Take 1 tablet (5 mg total) by mouth 2 (two) times daily for 14 days. From 01/07/22--take 5 mg twice a day 04/04/22 04/18/22  Pilar Jarvis, MD  diltiazem (CARDIZEM CD) 240 MG 24 hr capsule Take 1 capsule (240 mg total) by mouth daily. Patient not taking: Reported on 10/11/2022 04/08/22   Hollice Espy, MD  ENSURE (ENSURE) Take 237 mLs by mouth 2 (two) times daily between meals. 10/20/17 10/17/20  Reubin Milan, MD     Family History  Problem Relation Age of Onset   Heart disease Mother    Brain cancer Mother    Heart attack Father    Liver disease Daughter    Throat cancer Brother  Cancer Maternal Grandfather        Unsure type    Social History   Socioeconomic History   Marital status: Widowed    Spouse name: Not on file   Number of children: 4   Years of education: 9 th grade   Highest education level: 9th grade  Occupational History   Occupation: Retired  Tobacco Use   Smoking status: Never   Smokeless tobacco: Never   Tobacco comments:    smoking cessation materials not required  Vaping Use   Vaping Use: Never used  Substance and Sexual Activity   Alcohol use: No    Drug use: No   Sexual activity: Not Currently  Other Topics Concern   Not on file  Social History Narrative   Pt lives with her son   Social Determinants of Health   Financial Resource Strain: Low Risk  (01/29/2022)   Overall Financial Resource Strain (CARDIA)    Difficulty of Paying Living Expenses: Not hard at all  Food Insecurity: No Food Insecurity (10/11/2022)   Hunger Vital Sign    Worried About Running Out of Food in the Last Year: Never true    Ran Out of Food in the Last Year: Never true  Transportation Needs: No Transportation Needs (10/11/2022)   PRAPARE - Administrator, Civil Service (Medical): No    Lack of Transportation (Non-Medical): No  Physical Activity: Inactive (01/29/2022)   Exercise Vital Sign    Days of Exercise per Week: 0 days    Minutes of Exercise per Session: 0 min  Stress: No Stress Concern Present (01/29/2022)   [employer] of Occupational Health - Occupational Stress Questionnaire    Feeling of Stress : Not at all  Social Connections: Moderately Integrated (01/29/2022)   Social Connection and Isolation Panel [NHANES]    Frequency of Communication with Friends and Family: More than three times a week    Frequency of Social Gatherings with Friends and Family: More than three times a week    Attends Religious Services: 1 to 4 times per year    Active Member of [employer] or Organizations: Yes    Attends Banker Meetings: Never    Marital Status: Widowed      Review of Systems: A 12 point ROS discussed and pertinent positives are indicated in the HPI above.  All other systems are negative.  Review of Systems  Constitutional:  Negative for fatigue and fever.  HENT:  Negative for congestion.   Respiratory:  Negative for cough and shortness of breath.   Gastrointestinal:  Positive for abdominal pain (RUQ) and nausea. Negative for diarrhea and vomiting.    Vital Signs: BP (!) 142/58 (BP Location: Right Arm)   Pulse 90    Temp 99.3 F (37.4 C) (Oral)   Resp (!) 30   Ht 5' (1.524 m)   Wt 115 lb 11.9 oz (52.5 kg)   SpO2 90%   BMI 22.60 kg/m   Advance Care Plan: The advanced care plan/surrogate decision maker was discussed at the time of visit and documented in the medical record. Daughter.     Physical Exam Vitals and nursing note reviewed.  Constitutional:      Appearance: She is well-developed.  HENT:     Head: Normocephalic and atraumatic.  Eyes:     Conjunctiva/sclera: Conjunctivae normal.  Cardiovascular:     Rate and Rhythm: Normal rate and regular rhythm.  Pulmonary:     Effort: Pulmonary effort is normal.  Musculoskeletal:        General: Normal range of motion.     Cervical back: Normal range of motion.  Skin:    General: Skin is warm and dry.  Neurological:     General: No focal deficit present.     Mental Status: She is alert and oriented to person, place, and time.     Imaging: US ABDOMEN LIMITED RUQ (LIVER/GB)  Result Date: 10/11/2022 CLINICAL DATA:  Chest pain.  Abnormal CT. EXAM: ULTRASOUND ABDOMEN LIMITED RIGHT UPPER QUADRANT COMPARISON:  CTA chest from earlier today FINDINGS: Gallbladder: There is diffuse gallbladder wall thickening. Gallbladder wall thickness measures up to 6.8 mm. Nonmobile stones are identified. The largest measures 6.3 mm. Pericholecystic fluid. Positive sonographic Murphy's sign. Common bile duct: Diameter: 4.2 mm Liver: Increased parenchymal echogenicity. No focal abnormality. Portal vein is patent on color Doppler imaging with normal direction of blood flow towards the liver. Other: None. IMPRESSION: Gallstones, gallbladder wall thickening, pericholecystic fluid and positive sonographic Murphy's sign. Imaging findings are compatible with acute cholecystitis. Electronically Signed   By: Signa Kell M.D.   On: 10/11/2022 07:03   CT Angio Chest PE W and/or Wo Contrast  Result Date: 10/11/2022 CLINICAL DATA:  Pulmonary embolism suspected. High probability.  Sudden onset chest pain or home. History of left bundle branch block. EXAM: CT ANGIOGRAPHY CHEST WITH CONTRAST TECHNIQUE: Multidetector CT imaging of the chest was performed using the standard protocol during bolus administration of intravenous contrast. Multiplanar CT image reconstructions and MIPs were obtained to evaluate the vascular anatomy. RADIATION DOSE REDUCTION: This exam was performed according to the departmental dose-optimization program which includes automated exposure control, adjustment of the mA and/or kV according to patient size and/or use of iterative reconstruction technique. CONTRAST:  75mL OMNIPAQUE IOHEXOL 350 MG/ML SOLN COMPARISON:  Portable chest today, PA and lateral chest 04/04/2022, CTA chest 04/04/2022 and CTA chest 12/31/2021. FINDINGS: Cardiovascular: There is stable cardiomegaly with left ventricular and septal wall hypertrophy, scattered calcification in the mitral ring and patchy three-vessel coronary artery calcifications. No pericardial effusion is seen. The pulmonary veins are prominent, more so than previously. The pulmonary arteries are normal in caliber and do not show embolic filling defects. The aorta is tortuous and heavily calcified but normal caliber. No aneurysm, stenosis or dissection are seen. There are patchy calcifications in the great vessels. There is a small amount of air in the right subclavian vein continuing up the IJ vein, most likely either injected contrast injection or with insertion of the patient's IV. There is no air in the right heart and main pulmonary arteries. Mediastinum/Nodes: Large hiatal hernia with approximately 2/3 the stomach intrathoracic and inverted, with fluid level. Thoracic esophagus is unremarkable. No thyroid nodule, axillary or intrathoracic adenopathy are seen. Lungs/Pleura: There are chronic biapical pleural-parenchymal scarring changes and calcifications. There are bilateral trace pleural effusions, decreased since the prior CTA.  There is a calcified granuloma in the right middle lobe. No subpleural edema is seen. Chronic posterior basal left lower lobe atelectatic foci along side the hiatal hernia and scattered linear scar-like opacities in the bases. No focal pneumonia or pulmonary mass are seen. Upper Abdomen: There is gallbladder dilatation, wall thickening and scattered stones. Findings concerning for acute cholecystitis. Further evaluation recommended. The abdominal aorta is heavily calcified. Musculoskeletal: There is dextroscoliosis, osteopenia and degenerative change of the spine. No acute osseous findings. No aggressive lesions. Review of the MIP images confirms the above findings. IMPRESSION: 1. No evidence of arterial dilatation or embolus.  2. Cardiomegaly with left ventricular and septal wall hypertrophy and prominent pulmonary veins, but no overt edema. 3. Trace pleural effusions, decreased since the last CTA. 4. Coronary and heavy aortic atherosclerosis. 5. Large hiatal hernia. 6. Cholelithiasis and findings worrisome for acute cholecystitis. Further evaluation recommended. 7. Osteopenia, scoliosis and degenerative change. 8. Small amount of air in the right subclavian vein continuing up the IJ vein, most likely either injected with contrast injection or with insertion of the patient's IV. There is no air in the right heart and main pulmonary arteries. Aortic Atherosclerosis (ICD10-I70.0). Electronically Signed   By: Almira Bar M.D.   On: 10/11/2022 05:49   DG Chest 1 View  Result Date: 10/11/2022 CLINICAL DATA:  Shortness of breath and chest pain. EXAM: CHEST  1 VIEW COMPARISON:  Radiograph and CT 04/04/2022 FINDINGS: Normal heart size. Stable mediastinal contours. Dense aortic atherosclerosis. Retrocardiac hiatal hernia. Mild chronic interstitial coarsening. No focal airspace disease. No pulmonary edema or pleural effusion. No pneumothorax. Scoliotic curvature. IMPRESSION: No acute abnormality. Electronically Signed    By: Narda Rutherford M.D.   On: 10/11/2022 02:49    Labs:  CBC: Recent Labs    04/06/22 0426 04/07/22 0707 10/11/22 0228 10/12/22 0242  WBC 3.6* 4.0 9.0 7.2  HGB 10.8* 12.5 12.2 11.1*  HCT 32.3* 36.9 37.5 33.9*  PLT 157 182 202 162    COAGS: Recent Labs    10/11/22 0228 10/11/22 0823  INR 1.1  --   APTT  --  27    BMP: Recent Labs    04/06/22 0426 04/07/22 0707 10/11/22 0228 10/12/22 0242  NA 136 135 137 134*  K 3.8 4.1 3.5 4.2  CL 100 98 101 100  CO2 30 30 26 23   GLUCOSE 109* 108* 170* 119*  BUN 18 19 20 13   CALCIUM 8.5* 9.0 8.8* 8.4*  CREATININE 1.09* 1.12* 0.99 0.99  GFRNONAA 50* 48* 56* 56*    LIVER FUNCTION TESTS: Recent Labs    12/30/21 1825 01/19/22 1449 10/11/22 0413  BILITOT 0.8 0.4 0.6  AST 24 16 25   ALT 21 11 19   ALKPHOS 69 102 80  PROT 6.5 6.9 7.1  ALBUMIN 3.4* 4.4 3.8    TUMOR MARKERS: No results for input(s): "AFPTM", "CEA", "CA199", "CHROMGRNA" in the last 8760 hours.  Assessment and Plan:  87 y.o. female inpatient. History of a fib (on equilis), DVT, HTN, HLD, CAD, CHF, CKD, left bundle brach block. Presented to the ED at [facility] with sudden onset of  abdominal pain. Found to have acute cholecystitis. Patient was deemed not a surgical candidate. Team is requesting a cholecystomy tube placement.   [place] abd from 6.3.24 reads Gallstones, gallbladder wall thickening, pericholecystic fluid and positive sonographic Murphy's sign. Imaging findings are compatible with acute cholecystitis. CT abd pelvis from 6.3.24 reads  Cholelithiasis and findings worrisome for acute cholecystitis. Further evaluation recommended. Labs from 6.3.24 show BNP 127.7.  Lipase 52, No leukocytosis. Last dose of eliquis was on the evening of 6.2.24. IR MD made aware. All other labs and medications are within acceptable parameters. NKDA.  Risks and benefits discussed with the patient including, but not limited to bleeding, infection, gallbladder perforation, bile leak,  sepsis or even death.  All of the patient's questions were answered, patient is agreeable to proceed. Consent signed and in chart.   Thank you for this interesting consult.  I greatly enjoyed meeting Vanessa Farmer and look forward to participating in their care.  A copy of this  report was sent to the requesting provider on this date.  Electronically Signed: Alene Mires, NP 10/12/2022, 8:38 AM   I spent a total of 40 Minutes    in face to face in clinical consultation, greater than 50% of which was counseling/coordinating care for cholecystomy tube placement.

## 2022-10-12 NOTE — Progress Notes (Signed)
   10/12/22 0754  Assess: MEWS Score  Temp 99.3 F (37.4 C)  BP (!) 142/58  MAP (mmHg) 84  Pulse Rate 90  Resp (!) 30  Level of Consciousness Alert  SpO2 90 %  O2 Device Nasal Cannula  Assess: MEWS Score  MEWS Temp 0  MEWS Systolic 0  MEWS Pulse 0  MEWS RR 2  MEWS LOC 0  MEWS Score 2  MEWS Score Color Yellow  Assess: if the MEWS score is Yellow or Red  Were vital signs taken at a resting state? Yes  Focused Assessment No change from prior assessment  Does the patient meet 2 or more of the SIRS criteria? Yes  Does the patient have a confirmed or suspected source of infection? No  MEWS guidelines implemented  Yes, yellow  Treat  MEWS Interventions Considered administering scheduled or prn medications/treatments as ordered  Take Vital Signs  Increase Vital Sign Frequency  Yellow: Q2hr x1, continue Q4hrs until patient remains green for 12hrs  Escalate  MEWS: Escalate Yellow: Discuss with charge nurse and consider notifying provider and/or RRT  Notify: Charge Nurse/RN  Name of Charge Nurse/RN Notified ALe RN  Assess: SIRS CRITERIA  SIRS Temperature  0  SIRS Pulse 0  SIRS Respirations  1  SIRS WBC 0  SIRS Score Sum  1

## 2022-10-12 NOTE — Progress Notes (Addendum)
[facility] SURGICAL PROGRESS NOTE (cpt [phone])  Hospital Day(s): 1.   Interval History: Patient seen and examined, no acute events or new complaints overnight. Patient still with significant RUQ tenderness this morning. Remains without leukocytosis; 7.2K. Hgb to 11.1. Renal function normal; sCr - 0.99; UO - 650 ccs + unmeasured. She is NPO this morning. Continues on Zosyn. Plan for IR percutaneous cholecystostomy tube today..   Review of Systems:  Constitutional: denies fever, chills  HEENT: denies cough or congestion  Respiratory: denies any shortness of breath  Cardiovascular: denies chest pain or palpitations  Gastrointestinal: + abdominal pain, denied N/V Genitourinary: denies burning with urination or urinary frequency Musculoskeletal: denies pain, decreased motor or sensation  Vital signs in last 24 hours: [min-max] current  Temp:  [97.5 F (36.4 C)-99.3 F (37.4 C)] 99.3 F (37.4 C) (06/04 0754) Pulse Rate:  [60-90] 90 (06/04 0754) Resp:  [15-30] 30 (06/04 0754) BP: (142-172)/(58-97) 142/58 (06/04 0754) SpO2:  [90 %-100 %] 90 % (06/04 0754) Weight:  [52.5 kg] 52.5 kg (06/04 0345)     Height: 5' (152.4 cm) Weight: 52.5 kg BMI (Calculated): 22.6   Intake/Output last 2 shifts:  06/03 0701 - 06/04 0700 In: 1355.8 [I.V.:1252.9; IV Piggyback:102.9] Out: 650 [Urine:650]   Physical Exam:  Constitutional: alert, cooperative and no distress  HENT: normocephalic without obvious abnormality  Eyes: PERRL, EOM's grossly intact and symmetric  Respiratory: breathing non-labored at rest  Cardiovascular: regular rate and sinus rhythm  Gastrointestinal: soft, still with RUQ tenderness, non-distended, no rebound/guarding  Musculoskeletal: no edema or wounds, motor and sensation grossly intact, NT    Labs:     Latest Ref Rng & Units 10/12/2022    2:42 AM 10/11/2022    2:28 AM 04/07/2022    7:07 AM  CBC  WBC 4.0 - 10.5 K/uL 7.2  9.0  4.0   Hemoglobin 12.0 - 15.0 g/dL  91.4  78.2  95.6   Hematocrit 36.0 - 46.0 % 33.9  37.5  36.9   Platelets 150 - 400 K/uL 162  202  182       Latest Ref Rng & Units 10/12/2022    2:42 AM 10/11/2022    4:13 AM 10/11/2022    2:28 AM  CMP  Glucose 70 - 99 mg/dL 213   086   BUN 8 - 23 mg/dL 13   20   Creatinine 5.78 - 1.00 mg/dL 4.69   6.29   Sodium 528 - 145 mmol/L 134   137   Potassium 3.5 - 5.1 mmol/L 4.2   3.5   Chloride 98 - 111 mmol/L 100   101   CO2 22 - 32 mmol/L 23   26   Calcium 8.9 - 10.3 mg/dL 8.4   8.8   Total Protein 6.5 - 8.1 g/dL  7.1    Total Bilirubin 0.3 - 1.2 mg/dL  0.6    Alkaline Phos 38 - 126 U/L  80    AST 15 - 41 U/L  25    ALT 0 - 44 U/L  19      Imaging studies: No new pertinent imaging studies   Assessment/Plan: (ICD-10's: K81.0) 87 y.o. female with acute cholecystitis complicated by pertinent comorbidities including need for anticoagulation.   - Appreciate IR assistance; plan for percutaneous cholecystostomy tube today. Patient, and her family at bedside, understand she will need to keep this for at least 6-8 weeks to allow optimization for potential cholecystectomy             -  Would continue NPO for now; IVF support             - Continue IV Abx (Zosyn)             - Monitor abdominal examination; on-going bowel function             - Pain control prn; antiemetics prn              - DVT prophylaxis - IV heparin, will need to hold a few hours prior to IR procedure             - Further management per primary service; we will follow    All of the above findings and recommendations were discussed with the patient and her family, and all of their questions were answered to their expressed satisfaction.   -- Lynden Oxford, PA-C [facility] 10/12/2022, 8:38 AM M-F: 7am - 4pm

## 2022-10-12 NOTE — Hospital Course (Signed)
Vanessa Farmer is a 87 y.o. female with medical history significant of DVT and A-fib on Eliquis, hypertension, hyperlipidemia, CAD, dCHF, depression, CKD-3A, left bundle blockade, scoliosis, who presents with pain in epigastric area and lower chest  Vital signs showed a heart rate of 111, respirate is 32.  Ultrasound showed gallstone with cholecystitis.  Patient was placed on Zosyn, IR drain performed 6/4.  6/5: Discontinue telemetry, left ankle x-ray negative for acute pathology, started on full liquid diet, PT and OT eval, transition to Eliquis and discontinue heparin.  Added ibuprofen for ankle pain.  Wound care nurse evaluation for left ankle wound/scab 6/6: Palliative care consult, added Senokot and MiraLAX

## 2022-10-12 NOTE — Consult Note (Signed)
ANTICOAGULATION CONSULT NOTE - Initial Consult  Pharmacy Consult for Heparin Infusion Indication: atrial fibrillation  No Known Allergies  Patient Measurements: Height: 5' (152.4 cm) Weight: 44.9 kg (99 lb) IBW/kg (Calculated) : 45.5 Heparin Dosing Weight: 44.9 kg  Vital Signs: Temp: 97.9 F (36.6 C) (06/03 1900) BP: 153/66 (06/03 1900) Pulse Rate: 67 (06/03 1900)  Labs: Recent Labs    10/11/22 0228 10/11/22 0413 10/11/22 0823 10/11/22 1807 10/12/22 0242  HGB 12.2  --   --   --  11.1*  HCT 37.5  --   --   --  33.9*  PLT 202  --   --   --  162  APTT  --   --  27  --   --   LABPROT 14.4  --   --   --   --   INR 1.1  --   --   --   --   HEPARINUNFRC  --   --  <0.10* 0.17* 0.32  CREATININE 0.99  --   --   --  0.99  TROPONINIHS 11 12  --   --   --      Estimated Creatinine Clearance: 29.4 mL/min (by C-G formula based on SCr of 0.99 mg/dL).   Medical History: Past Medical History:  Diagnosis Date   Anemia    Cataract    Depression    Fall    GERD (gastroesophageal reflux disease)    Hyperlipidemia    Hypertension    Myocardial infarction [facility])    "mild" many yrs ago    Medications:  No prior anticoagulation noted  Assessment: 87 y.o. female with medical history significant of DVT and A-fib on Eliquis. Presented with with chest pain, epigastric abdominal pain. CT was negative for PE. [place] c/f acute cholecystis. Pharmacy consulted for transition from apixaban to heparin for possible planned procedures. Baseline labs ordered and WNL.  6/3 1803  HL 0.17 6/4 0242  HL 0.32, therapeutic X 1   Goal of Therapy:  Heparin level 0.3-0.7 units/ml aPTT 66-102 seconds Monitor platelets by anticoagulation protocol: Yes   Plan:  6/4:  HL @ 0242 = 0.32, therapeutic X 1 - Will continue pt on current rate and recheck HL again on 6/4 @ 1100.  -Daily CBC while on Heparin infusion  Marius Betts D 10/12/2022 3:45 AM

## 2022-10-12 NOTE — Progress Notes (Addendum)
  Progress Note   Patient: Vanessa Farmer ZOX:[id number] DOB: 1935/10/29 DOA: 10/11/2022     1 DOS: the patient was seen and examined on 10/12/2022   Brief hospital course: Vanessa Farmer is a 87 y.o. female with medical history significant of DVT and A-fib on Eliquis, hypertension, hyperlipidemia, CAD, dCHF, depression, CKD-3A, left bundle blockade, scoliosis, who presents with pain in epigastric area and lower chest  Vital signs showed a heart rate of 111, respirate is 32.  Ultrasound showed gallstone with cholecystitis.  Patient was placed on Zosyn, IR drain performed 6/4.   Principal Problem:   Acute cholecystitis Active Problems:   Cholelithiasis   CAD (coronary artery disease)   History of DVT of lower extremity   (HFpEF) heart failure with preserved ejection fraction (HCC)   Atrial fibrillation, chronic (HCC)   Essential hypertension   Hyperlipidemia   Chronic kidney disease, stage 3a (HCC)   QT prolongation   Depression   Protein-calorie malnutrition, moderate (HCC)   Assessment and Plan: Acute cholecystitis and cholelithiasis:  Patient is a high risk for cholecystectomy, instead, patient will be treated with cholecystostomy.  Continue Zosyn. Continue symptomatic treatment with pain medicine.   CAD (coronary artery disease):  Patient stable. Negative troponin.  History of DVT of lower extremity On IV heparin, may switch back to Eliquis postprocedure.   (HFpEF) heart failure with preserved ejection fraction [facility]):  No evidence of volume overload at this time.   Atrial fibrillation, chronic (HCC):  Continue anticoagulation.  Condition stable.   Essential hypertension Continue home medicines.   Chronic kidney disease, stage 3a [facility]):  Renal function stable.  QT prolongation:  Continue to follow.   Protein-calorie malnutrition, moderate (HCC):  Continue protein supplement.           Subjective: Patient still complains significant right upper quadrant  abdominal pain.  Physical Exam: Vitals:   10/12/22 0345 10/12/22 0356 10/12/22 0754 10/12/22 1006  BP:  (!) 147/60 (!) 142/58 (!) 140/57  Pulse:  70 90 74  Resp:  18 (!) 30 18  Temp:  98.6 F (37 C) 99.3 F (37.4 C) 98.2 F (36.8 C)  TempSrc:   Oral Oral  SpO2:  97% 90% 95%  Weight: 52.5 kg     Height:       General exam: Appears calm and comfortable  Respiratory system: Clear to auscultation. Respiratory effort normal. Cardiovascular system: Irregular. No JVD, murmurs, rubs, gallops or clicks. No pedal edema. Gastrointestinal system: Abdomen is nondistended, soft and RUQ tender. No organomegaly or masses felt. Normal bowel sounds heard. Central nervous system: Alert and oriented x3. No focal neurological deficits. Extremities: Symmetric 5 x 5 power. Skin: No rashes, lesions or ulcers Psychiatry: Judgement and insight appear normal. Mood & affect appropriate.    Data Reviewed:  Reviewed CT scan results, ultrasound results and the lab results.  Family Communication: None  Disposition: Status is: Inpatient Remains inpatient appropriate because: Severity of disease, IV treatment.  Inpatient procedure.     Time spent: 35 minutes  Author: Marrion Coy, MD 10/12/2022 12:12 PM  For on call review [email or web address].

## 2022-10-12 NOTE — Progress Notes (Signed)
   10/12/22 1619  Assess: MEWS Score  Temp 97.7 F (36.5 C)  BP (!) 136/56  MAP (mmHg) 78  Pulse Rate 74  Resp 20  SpO2 96 %  O2 Device Nasal Cannula  O2 Flow Rate (L/min) 2 L/min  Assess: MEWS Score  MEWS Temp 0  MEWS Systolic 0  MEWS Pulse 0  MEWS RR 0  MEWS LOC 0  MEWS Score 0  MEWS Score Color Green  Assess: if the MEWS score is Yellow or Red  Were vital signs taken at a resting state? Yes  Focused Assessment No change from prior assessment  Does the patient meet 2 or more of the SIRS criteria? No  Does the patient have a confirmed or suspected source of infection? No  MEWS guidelines implemented  Yes, yellow  Treat  MEWS Interventions Considered administering scheduled or prn medications/treatments as ordered  Take Vital Signs  Increase Vital Sign Frequency  Yellow: Q2hr x1, continue Q4hrs until patient remains green for 12hrs  Escalate  MEWS: Escalate Yellow: Discuss with charge nurse and consider notifying provider and/or RRT  Assess: SIRS CRITERIA  SIRS Temperature  0  SIRS Pulse 0  SIRS Respirations  0  SIRS WBC 0  SIRS Score Sum  0

## 2022-10-12 NOTE — Consult Note (Signed)
ANTICOAGULATION CONSULT NOTE  Pharmacy Consult for Heparin Infusion Indication: atrial fibrillation  Patient Measurements: Height: 5' (152.4 cm) Weight: 52.5 kg (115 lb 11.9 oz) IBW/kg (Calculated) : 45.5 Heparin Dosing Weight: 44.9 kg  Labs: Recent Labs    10/11/22 0228 10/11/22 0413 10/11/22 0823 10/11/22 0823 10/11/22 1807 10/12/22 0242 10/12/22 1056  HGB 12.2  --   --   --   --  11.1*  --   HCT 37.5  --   --   --   --  33.9*  --   PLT 202  --   --   --   --  162  --   APTT  --   --  27  --   --   --   --   LABPROT 14.4  --   --   --   --   --   --   INR 1.1  --   --   --   --   --   --   HEPARINUNFRC  --   --  <0.10*   < > 0.17* 0.32 <0.10*  CREATININE 0.99  --   --   --   --  0.99  --   TROPONINIHS 11 12  --   --   --   --   --    < > = values in this interval not displayed.    Estimated Creatinine Clearance: 29.8 mL/min (by C-G formula based on SCr of 0.99 mg/dL).  Medical History: Past Medical History:  Diagnosis Date   Anemia    Cataract    Depression    Fall    GERD (gastroesophageal reflux disease)    Hyperlipidemia    Hypertension    Myocardial infarction [facility])    "mild" many yrs ago    Medications:  Patient reportedly on apixaban prior to admission for history of VTE and Afib Dispense report shows last fill 05/17/22 for 14 day supply Baseline heparin level < 0.1, not consistent with active apixaban usage where interference is usually observed  Assessment: 87 y.o. female with medical history significant of DVT and A-fib on Eliquis. Presented with with chest pain, epigastric abdominal pain. CT was negative for PE. [place] c/f acute cholecystis. Pharmacy consulted for transition from apixaban to heparin for possible planned procedures.  Baseline aPTT 27s, INR 1.1, heparin level <0.1. Baseline CBC within normal limits.  0603 1803  HL 0.17; 650 un/hr 0604 0242  HL 0.32, 800 un/hr 0604 1056 HL <0.1; 800 un/hr  Goal of Therapy:  Heparin level 0.3-0.7  units/ml Monitor platelets by anticoagulation protocol: Yes   Plan:  Resume heparin infusion 1 hour after chest tube insertion per IR nurse. Resume heparin infusion at 800 units/hr Check HL 8 hours after resumption Daily CBC while on heparin  Barrie Folk, PharmD 10/12/2022 3:33 PM

## 2022-10-12 NOTE — Procedures (Signed)
Interventional Radiology Procedure Note  Date of Procedure: 10/12/2022  Procedure: Percutaneous cholecystostomy placement   Findings:  1. Percutaneous cholecystostomy placement  10 Fr to bag drain    Complications: No immediate complications noted.   Estimated Blood Loss: minimal  Follow-up and Recommendations: 1. Per surgery  2. May follow up with IR in 6 weeks for drain exchange if remains in place    Olive Bass, MD  Vascular & Interventional Radiology  10/12/2022 2:29 PM

## 2022-10-13 ENCOUNTER — Inpatient Hospital Stay: Payer: Medicare Other

## 2022-10-13 DIAGNOSIS — E440 Moderate protein-calorie malnutrition: Secondary | ICD-10-CM | POA: Diagnosis not present

## 2022-10-13 DIAGNOSIS — F32A Depression, unspecified: Secondary | ICD-10-CM

## 2022-10-13 DIAGNOSIS — I10 Essential (primary) hypertension: Secondary | ICD-10-CM | POA: Diagnosis not present

## 2022-10-13 DIAGNOSIS — K810 Acute cholecystitis: Secondary | ICD-10-CM | POA: Diagnosis not present

## 2022-10-13 LAB — COMPREHENSIVE METABOLIC PANEL
ALT: 188 U/L — ABNORMAL HIGH (ref 0–44)
AST: 107 U/L — ABNORMAL HIGH (ref 15–41)
Albumin: 2.7 g/dL — ABNORMAL LOW (ref 3.5–5.0)
Alkaline Phosphatase: 76 U/L (ref 38–126)
Anion gap: 8 (ref 5–15)
BUN: 15 mg/dL (ref 8–23)
CO2: 25 mmol/L (ref 22–32)
Calcium: 8.1 mg/dL — ABNORMAL LOW (ref 8.9–10.3)
Chloride: 100 mmol/L (ref 98–111)
Creatinine, Ser: 0.92 mg/dL (ref 0.44–1.00)
GFR, Estimated: >60 mL/min (ref >60)
Glucose, Bld: 101 mg/dL — ABNORMAL HIGH (ref 70–99)
Potassium: 3.6 mmol/L (ref 3.5–5.1)
Sodium: 133 mmol/L — ABNORMAL LOW (ref 135–145)
Total Bilirubin: 1.3 mg/dL — ABNORMAL HIGH (ref 0.3–1.2)
Total Protein: 5.6 g/dL — ABNORMAL LOW (ref 6.5–8.1)

## 2022-10-13 LAB — PHOSPHORUS: Phosphorus: 3.7 mg/dL (ref 2.5–4.6)

## 2022-10-13 LAB — CBC
HCT: 33.8 % — ABNORMAL LOW (ref 36.0–46.0)
Hemoglobin: 11.2 g/dL — ABNORMAL LOW (ref 12.0–15.0)
MCH: 33 pg (ref 26.0–34.0)
MCHC: 33.1 g/dL (ref 30.0–36.0)
MCV: 99.7 fL (ref 80.0–100.0)
Platelets: 160 10*3/uL (ref 150–400)
RBC: 3.39 MIL/uL — ABNORMAL LOW (ref 3.87–5.11)
RDW: 13 % (ref 11.5–15.5)
WBC: 7.9 10*3/uL (ref 4.0–10.5)
nRBC: 0 % (ref 0.0–0.2)

## 2022-10-13 LAB — CULTURE, BLOOD (ROUTINE X 2): Special Requests: ADEQUATE

## 2022-10-13 LAB — HEPARIN LEVEL (UNFRACTIONATED)
Heparin Unfractionated: 0.17 IU/mL — ABNORMAL LOW (ref 0.30–0.70)
Heparin Unfractionated: 0.27 IU/mL — ABNORMAL LOW (ref 0.30–0.70)

## 2022-10-13 LAB — MAGNESIUM: Magnesium: 1.9 mg/dL (ref 1.7–2.4)

## 2022-10-13 MED ORDER — APIXABAN 2.5 MG PO TABS
2.5000 mg | ORAL_TABLET | Freq: Two times a day (BID) | ORAL | Status: DC
  Administered 2022-10-13 – 2022-10-15 (×5): 2.5 mg via ORAL
  Filled 2022-10-13 (×5): qty 1

## 2022-10-13 MED ORDER — HEPARIN BOLUS VIA INFUSION
800.0000 [IU] | Freq: Once | INTRAVENOUS | Status: AC
  Administered 2022-10-13: 800 [IU] via INTRAVENOUS
  Filled 2022-10-13: qty 800

## 2022-10-13 MED ORDER — BACITRACIN-NEOMYCIN-POLYMYXIN OINTMENT TUBE
TOPICAL_OINTMENT | Freq: Every day | CUTANEOUS | Status: DC
  Filled 2022-10-13: qty 14.17

## 2022-10-13 MED ORDER — HEPARIN BOLUS VIA INFUSION
1350.0000 [IU] | Freq: Once | INTRAVENOUS | Status: AC
  Administered 2022-10-13: 1350 [IU] via INTRAVENOUS
  Filled 2022-10-13: qty 1350

## 2022-10-13 MED ORDER — IBUPROFEN 400 MG PO TABS: 400.0000 mg | ORAL_TABLET | Freq: Four times a day (QID) | ORAL | Status: DC

## 2022-10-13 MED ORDER — IBUPROFEN 400 MG PO TABS
400.0000 mg | ORAL_TABLET | Freq: Four times a day (QID) | ORAL | Status: DC
  Administered 2022-10-13 – 2022-10-15 (×7): 400 mg via ORAL
  Filled 2022-10-13 (×7): qty 1

## 2022-10-13 NOTE — Consult Note (Signed)
WOC Nurse Consult Note: Reason for Consult: ankle wound Patient poor historian but does not recall an injury to the ankle, reports no surgery at this site. Requiring pain meds for the ankle wound, reporting to be "burning pain".  She has not been followed outpatient that I can tell for this wound. Reports her pharmacist telling her to "keep it covered"  Wound type:full thickness, scabbed, eschar, no fluctuance  Pressure Injury POA: NA Measurement:0.4cm x 0.4cm x 0cm  Wound bed:100% hard black eschar  Drainage (amount, consistency, odor) none Periwound: scant erythema at 3 o'clock  Palpable pedal pulse, no other abnormalities of the foot or ankle noted    Dressing procedure/placement/frequency: Antibiotic ointment daily, cover with foam Stat xray; negative. No underlying cause of pain.  Follow up with podiatrist if continued to be problematic for patient.  Discussed POC with patient and bedside nurse.  Re consult if needed, will not follow at this time. Thanks  Ashvin Adelson [employer], RN,CWOCN, CNS, CWON-AP [phone])

## 2022-10-13 NOTE — Progress Notes (Signed)
[facility] SURGICAL PROGRESS NOTE (cpt [phone])  Hospital Day(s): 2.   Interval History: Patient seen and examined, no acute events or new complaints overnight. Patient improvement in abdominal pain. RUQ soreness related to drain itself. Some nausea. No fever, chills, emesis. Remains without leukocytosis; 7.9K. Hgb to 11.2. Renal function normal; sCr - 0.92; UO - 800 ccs + unmeasured. Did have slight bump in LFTs this AM; bilirubin 1.3. She did undergo percutaneous cholecystostomy tube placement with IR yesterday (06/04). Output recorded at 45 ccs; this is more sanguinous. There do not appear to be Cx from this. She is currently on Zosyn. On CLD; tolerating  Review of Systems:  Constitutional: denies fever, chills  HEENT: denies cough or congestion  Respiratory: denies any shortness of breath  Cardiovascular: denies chest pain or palpitations  Gastrointestinal: + abdominal pain (improved), denied N/V Genitourinary: denies burning with urination or urinary frequency Musculoskeletal: denies pain, decreased motor or sensation  Vital signs in last 24 hours: [min-max] current  Temp:  [97.7 F (36.5 C)-99.8 F (37.7 C)] 98.2 F (36.8 C) (06/05 0444) Pulse Rate:  [71-124] 80 (06/05 0444) Resp:  [10-35] 10 (06/05 0444) BP: (113-168)/(53-93) 129/59 (06/05 0444) SpO2:  [86 %-96 %] 94 % (06/05 0444) Weight:  [53.7 kg] 53.7 kg (06/05 0448)     Height: 5' (152.4 cm) Weight: 53.7 kg BMI (Calculated): 23.12   Intake/Output last 2 shifts:  06/04 0701 - 06/05 0700 In: 1940.8 [P.O.:200; I.V.:1651.8; IV Piggyback:84] Out: 845 [Urine:800; Drains:45]   Physical Exam:  Constitutional: alert, cooperative and no distress  HENT: normocephalic without obvious abnormality  Eyes: PERRL, EOM's grossly intact and symmetric  Respiratory: breathing non-labored at rest  Cardiovascular: regular rate and sinus rhythm  Gastrointestinal: soft, RUQ pain improved; now just at drain site itself,  non-distended, no rebound/guarding. Percutaneous cholecystostomy tube in RUQ; output sanguinous  Musculoskeletal: no edema or wounds, motor and sensation grossly intact, NT    Labs:     Latest Ref Rng & Units 10/13/2022    3:42 AM 10/12/2022    2:42 AM 10/11/2022    2:28 AM  CBC  WBC 4.0 - 10.5 K/uL 7.9  7.2  9.0   Hemoglobin 12.0 - 15.0 g/dL 60.4  54.0  98.1   Hematocrit 36.0 - 46.0 % 33.8  33.9  37.5   Platelets 150 - 400 K/uL 160  162  202       Latest Ref Rng & Units 10/13/2022    3:42 AM 10/12/2022    2:42 AM 10/11/2022    4:13 AM  CMP  Glucose 70 - 99 mg/dL 191  478    BUN 8 - 23 mg/dL 15  13    Creatinine 2.95 - 1.00 mg/dL 6.21  3.08    Sodium 657 - 145 mmol/L 133  134    Potassium 3.5 - 5.1 mmol/L 3.6  4.2    Chloride 98 - 111 mmol/L 100  100    CO2 22 - 32 mmol/L 25  23    Calcium 8.9 - 10.3 mg/dL 8.1  8.4    Total Protein 6.5 - 8.1 g/dL 5.6   7.1   Total Bilirubin 0.3 - 1.2 mg/dL 1.3   0.6   Alkaline Phos 38 - 126 U/L 76   80   AST 15 - 41 U/L 107   25   ALT 0 - 44 U/L 188   19     Imaging studies: No new pertinent imaging studies  Assessment/Plan: (ICD-10's: K81.0) 87 y.o. female with acute cholecystitis complicated by pertinent comorbidities including need for anticoagulation.   - Okay to advance diet as tolerated - Continue percutaneous cholecystostomy tube; monitor and record output; flush daily - No emergent surgical intervention. She understands that she will ned to keep drain for a minimum of 6-8 weeks prior to consideration of interval cholecystectomy             - Continue IV Abx (Zosyn)             - Monitor abdominal examination; on-going bowel function             - Pain control prn; antiemetics prn              - Further management per primary service; we will follow    - Discharge Planning: Doing well s/p percutaneous drain placement. From surgical perspective, awaiting diet advancement. Potentially home in 24-48 hours.    All of the above findings  and recommendations were discussed with the patient and her family, and all of their questions were answered to their expressed satisfaction.  -- Lynden Oxford, PA-C [facility] 10/13/2022, 7:30 AM M-F: 7am - 4pm

## 2022-10-13 NOTE — Progress Notes (Signed)
Referring Physician(s): Henrene Dodge, MD  Supervising Physician: Malachy Moan  Patient Status:  [facility] - In-pt  Chief Complaint:  Acute cholecystitis s/p percutaneous cholecystostomy drain placement 10/12/22   Subjective:  Patient lying in bed at time of exam. She states her drain insertion site is not too painful, but notes pain in her leg at time of exam.   Allergies: Patient has no known allergies.  Medications: Prior to Admission medications   Medication Sig Start Date End Date Taking? Authorizing Provider  amLODipine (NORVASC) 10 MG tablet TAKE 1 TABLET(10 MG) BY MOUTH DAILY 04/06/22  Yes Reubin Milan, MD  atorvastatin (LIPITOR) 40 MG tablet TAKE 1 TABLET(40 MG) BY MOUTH DAILY 07/30/22  Yes Reubin Milan, MD  furosemide (LASIX) 20 MG tablet TAKE 1 TABLET BY MOUTH TWICE A WEEK Patient taking differently: Take 20 mg by mouth daily. 09/21/22  Yes Reubin Milan, MD  lisinopril (ZESTRIL) 20 MG tablet TAKE 1 TABLET(20 MG) BY MOUTH DAILY 04/06/22  Yes Reubin Milan, MD  Multiple Vitamins-Calcium (ONE-A-DAY WOMENS PO) Take 1 tablet by mouth daily.   Yes [provider]  pantoprazole (PROTONIX) 40 MG tablet TAKE 1 TABLET(40 MG) BY MOUTH DAILY 03/08/22  Yes Reubin Milan, MD  sertraline (ZOLOFT) 50 MG tablet TAKE 1 TABLET(50 MG) BY MOUTH DAILY 07/30/22  Yes Reubin Milan, MD  apixaban (ELIQUIS) 5 MG TABS tablet Take 1 tablet (5 mg total) by mouth 2 (two) times daily for 14 days. From 01/07/22--take 5 mg twice a day 04/04/22 04/18/22  Pilar Jarvis, MD  diltiazem (CARDIZEM CD) 240 MG 24 hr capsule Take 1 capsule (240 mg total) by mouth daily. Patient not taking: Reported on 10/11/2022 04/08/22   Hollice Espy, MD  ENSURE (ENSURE) Take 237 mLs by mouth 2 (two) times daily between meals. 10/20/17 10/17/20  Reubin Milan, MD     Vital Signs: BP (!) 106/42 (BP Location: Right Arm)   Pulse 70   Temp 98.3 F (36.8 C) (Oral)   Resp 16   Ht 5'  (1.524 m)   Wt 118 lb 6.2 oz (53.7 kg)   SpO2 94%   BMI 23.12 kg/m   Physical Exam Vitals reviewed.  Constitutional:      General: She is not in acute distress.    Appearance: She is ill-appearing.  Skin:    General: Skin is warm and dry.  Neurological:     Mental Status: She is alert.    Drain Location: RUQ Size: Fr size: 10 Fr Date of placement: 10/12/22  Currently to: Drain collection device: gravity 24 hour output:  Output by Drain (mL) 10/11/22 0701 - 10/11/22 1900 10/11/22 1901 - 10/12/22 0700 10/12/22 0701 - 10/12/22 1900 10/12/22 1901 - 10/13/22 0700 10/13/22 0701 - 10/13/22 1523  Biliary Tube Cook slip-coat 10.2 Fr. RUQ   15 30     Interval imaging/drain manipulation:  None  Current examination: Flushes/aspirates easily.  Insertion site unremarkable. Suture and stat lock in place. Dressed appropriately.  Approximately 75 mL of bloody bilious output in drain bag at time of exam.  No erythema, induration, drainage, or other signs of infection at drain insertion site.   Imaging: DG Ankle Complete Left  Result Date: 10/13/2022 CLINICAL DATA:  Acute left ankle pain without known injury. EXAM: LEFT ANKLE COMPLETE - 3+ VIEW COMPARISON:  None Available. FINDINGS: There is no evidence of fracture, dislocation, or joint effusion. There is no evidence of arthropathy or other focal  bone abnormality. Soft tissues are unremarkable. IMPRESSION: Negative. Electronically Signed   By: Lupita Raider M.D.   On: 10/13/2022 13:55   IR Perc Cholecystostomy  Result Date: 10/12/2022 INDICATION: 6194 Acute cholecystitis 6194 EXAM: Placement of percutaneous cholecystostomy tube using ultrasound and fluoroscopic guidance MEDICATIONS: Documented in the EMR ANESTHESIA/SEDATION: Moderate (conscious) sedation was employed during this procedure. A total of Versed 0.5 mg and Fentanyl 25 mcg was administered intravenously by the radiology nurse. Total intra-service moderate Sedation Time: 15  minutes. The patient's level of consciousness and vital signs were monitored continuously by radiology nursing throughout the procedure under my direct supervision. FLUOROSCOPY: Radiation Exposure Index (as provided by the fluoroscopic device): 2.0 minutes (4 mGy) COMPLICATIONS: None immediate. PROCEDURE: Informed written consent was obtained from the patient after a thorough discussion of the procedural risks, benefits and alternatives. All questions were addressed. Maximal Sterile Barrier Technique was utilized including caps, mask, sterile gowns, sterile gloves, sterile drape, hand hygiene and skin antiseptic. A timeout was performed prior to the initiation of the procedure. The patient was placed supine on the exam table. The right upper quadrant was prepped and draped in the standard sterile fashion. Ultrasound of the right upper quadrant was performed for planning purposes. This again demonstrated a distended gallbladder with wall edema and sludge, consistent with acute cholecystitis. An infracostal transhepatic approach was planned. Skin entry site was marked, and local analgesia was obtained with 1% lidocaine. Under ultrasound guidance, percutaneous access was obtained into the gallbladder via an infracostal transhepatic approach using a 21 gauge Chiba needle. Access was confirmed with visualization of needle tip within the gallbladder lumen, and free return of bile. An 018 Nitrex wire was then advanced through the access needle and coiled within the gallbladder lumen. A transition dilator was advanced over this wire, through which an antegrade cholecystogram was performed. Antegrade cholecystogram demonstrated appropriate location in the gallbladder lumen. Over an Amplatz wire, the percutaneous tract was serially dilated followed by placement of a 10 French locking multipurpose drainage catheter into the gallbladder lumen. Locking loop was formed. Additional biliary sludge was drained. Gentle hand injection  of contrast material confirmed location of the gallbladder lumen. The drainage catheter was secured to the skin using silk suture and a dressing. It was placed to bag drainage. The patient tolerated the procedure well without immediate complication. IMPRESSION: Successful placement of a 10 French percutaneous cholecystostomy drainage catheter using ultrasound and fluoroscopic guidance. Cholecystostomy tube placed to bag drainage. Electronically Signed   By: Olive Bass M.D.   On: 10/12/2022 15:50   US ABDOMEN LIMITED RUQ (LIVER/GB)  Result Date: 10/11/2022 CLINICAL DATA:  Chest pain.  Abnormal CT. EXAM: ULTRASOUND ABDOMEN LIMITED RIGHT UPPER QUADRANT COMPARISON:  CTA chest from earlier today FINDINGS: Gallbladder: There is diffuse gallbladder wall thickening. Gallbladder wall thickness measures up to 6.8 mm. Nonmobile stones are identified. The largest measures 6.3 mm. Pericholecystic fluid. Positive sonographic Murphy's sign. Common bile duct: Diameter: 4.2 mm Liver: Increased parenchymal echogenicity. No focal abnormality. Portal vein is patent on color Doppler imaging with normal direction of blood flow towards the liver. Other: None. IMPRESSION: Gallstones, gallbladder wall thickening, pericholecystic fluid and positive sonographic Murphy's sign. Imaging findings are compatible with acute cholecystitis. Electronically Signed   By: Signa Kell M.D.   On: 10/11/2022 07:03   CT Angio Chest PE W and/or Wo Contrast  Result Date: 10/11/2022 CLINICAL DATA:  Pulmonary embolism suspected. High probability. Sudden onset chest pain or home. History of left bundle branch  block. EXAM: CT ANGIOGRAPHY CHEST WITH CONTRAST TECHNIQUE: Multidetector CT imaging of the chest was performed using the standard protocol during bolus administration of intravenous contrast. Multiplanar CT image reconstructions and MIPs were obtained to evaluate the vascular anatomy. RADIATION DOSE REDUCTION: This exam was performed according  to the departmental dose-optimization program which includes automated exposure control, adjustment of the mA and/or kV according to patient size and/or use of iterative reconstruction technique. CONTRAST:  75mL OMNIPAQUE IOHEXOL 350 MG/ML SOLN COMPARISON:  Portable chest today, PA and lateral chest 04/04/2022, CTA chest 04/04/2022 and CTA chest 12/31/2021. FINDINGS: Cardiovascular: There is stable cardiomegaly with left ventricular and septal wall hypertrophy, scattered calcification in the mitral ring and patchy three-vessel coronary artery calcifications. No pericardial effusion is seen. The pulmonary veins are prominent, more so than previously. The pulmonary arteries are normal in caliber and do not show embolic filling defects. The aorta is tortuous and heavily calcified but normal caliber. No aneurysm, stenosis or dissection are seen. There are patchy calcifications in the great vessels. There is a small amount of air in the right subclavian vein continuing up the IJ vein, most likely either injected contrast injection or with insertion of the patient's IV. There is no air in the right heart and main pulmonary arteries. Mediastinum/Nodes: Large hiatal hernia with approximately 2/3 the stomach intrathoracic and inverted, with fluid level. Thoracic esophagus is unremarkable. No thyroid nodule, axillary or intrathoracic adenopathy are seen. Lungs/Pleura: There are chronic biapical pleural-parenchymal scarring changes and calcifications. There are bilateral trace pleural effusions, decreased since the prior CTA. There is a calcified granuloma in the right middle lobe. No subpleural edema is seen. Chronic posterior basal left lower lobe atelectatic foci along side the hiatal hernia and scattered linear scar-like opacities in the bases. No focal pneumonia or pulmonary mass are seen. Upper Abdomen: There is gallbladder dilatation, wall thickening and scattered stones. Findings concerning for acute cholecystitis.  Further evaluation recommended. The abdominal aorta is heavily calcified. Musculoskeletal: There is dextroscoliosis, osteopenia and degenerative change of the spine. No acute osseous findings. No aggressive lesions. Review of the MIP images confirms the above findings. IMPRESSION: 1. No evidence of arterial dilatation or embolus. 2. Cardiomegaly with left ventricular and septal wall hypertrophy and prominent pulmonary veins, but no overt edema. 3. Trace pleural effusions, decreased since the last CTA. 4. Coronary and heavy aortic atherosclerosis. 5. Large hiatal hernia. 6. Cholelithiasis and findings worrisome for acute cholecystitis. Further evaluation recommended. 7. Osteopenia, scoliosis and degenerative change. 8. Small amount of air in the right subclavian vein continuing up the IJ vein, most likely either injected with contrast injection or with insertion of the patient's IV. There is no air in the right heart and main pulmonary arteries. Aortic Atherosclerosis (ICD10-I70.0). Electronically Signed   By: Almira Bar M.D.   On: 10/11/2022 05:49   DG Chest 1 View  Result Date: 10/11/2022 CLINICAL DATA:  Shortness of breath and chest pain. EXAM: CHEST  1 VIEW COMPARISON:  Radiograph and CT 04/04/2022 FINDINGS: Normal heart size. Stable mediastinal contours. Dense aortic atherosclerosis. Retrocardiac hiatal hernia. Mild chronic interstitial coarsening. No focal airspace disease. No pulmonary edema or pleural effusion. No pneumothorax. Scoliotic curvature. IMPRESSION: No acute abnormality. Electronically Signed   By: Narda Rutherford M.D.   On: 10/11/2022 02:49    Labs:  CBC: Recent Labs    04/07/22 0707 10/11/22 0228 10/12/22 0242 10/13/22 0342  WBC 4.0 9.0 7.2 7.9  HGB 12.5 12.2 11.1* 11.2*  HCT 36.9 37.5 33.9*  33.8*  PLT 182 202 162 160    COAGS: Recent Labs    10/11/22 0228 10/11/22 0823  INR 1.1  --   APTT  --  27    BMP: Recent Labs    04/07/22 0707 10/11/22 0228  10/12/22 0242 10/13/22 0342  NA 135 137 134* 133*  K 4.1 3.5 4.2 3.6  CL 98 101 100 100  CO2 30 26 23 25   GLUCOSE 108* 170* 119* 101*  BUN 19 20 13 15   CALCIUM 9.0 8.8* 8.4* 8.1*  CREATININE 1.12* 0.99 0.99 0.92  GFRNONAA 48* 56* 56* >60    LIVER FUNCTION TESTS: Recent Labs    12/30/21 1825 01/19/22 1449 10/11/22 0413 10/13/22 0342  BILITOT 0.8 0.4 0.6 1.3*  AST 24 16 25  107*  ALT 21 11 19  188*  ALKPHOS 69 102 80 76  PROT 6.5 6.9 7.1 5.6*  ALBUMIN 3.4* 4.4 3.8 2.7*    Assessment and Plan:  Acute cholecystitis s/p percutaneous cholecystostomy drain placement 10/12/22 -Patient tolerating drain well, no concerns at this time -~75 mL of bloody bilious output in drainage bag at time of exam -Hemoglobin of 11.2 today, up from 11.1 yesterday -WBC of 7.9 today, was 7.2 yesterday.    Plan: Continue TID flushes with 5 cc NS. Record output Q shift. Dressing changes QD or PRN if soiled.  Call IR APP or on call IR MD if difficulty flushing or sudden change in drain output.  Repeat imaging/possible drain injection once output < 10 mL/QD (excluding flush material). Consideration for drain removal if output is < 10 mL/QD (excluding flush material), pending discussion with the providing surgical service.  Discharge planning: Please contact IR APP or on call IR MD prior to patient d/c to ensure appropriate follow up plans are in place. Typically patient will follow up with IR 6-8 week post d/c for repeat imaging/possible drain injection. IR scheduler will contact patient with date/time of appointment. Patient will need to perform dressing changes every 2-3 days or earlier if soiled.   IR will continue to follow - please call with questions or concerns.  Electronically Signed: Kennieth Francois, PA-C 10/13/2022, 3:20 PM   I spent a total of 15 Minutes at the the patient's bedside AND on the patient's hospital floor or unit, greater than 50% of which was counseling/coordinating care for  acute cholecystitis s/p percutaneous cholecystostomy drain placement.

## 2022-10-13 NOTE — Consult Note (Signed)
\  [facility] Customer service manager [facility]) Accountable Care Organization (ACO) [facility] Liaison Note  10/13/2022  [name] 30-Dec-1935 [id number]  Location: [facility] RN Hospital Liaison screened the patient remotely at [facility].  Insurance: MCR ACO   Vanessa Farmer is a 87 y.o. female who is a Primary Care Patient of Judithann Graves, Nyoka Cowden, MD [facility] [place]). The patient was screened for  readmission hospitalization with noted medium risk score for unplanned readmission risk with 1 IP in 6 months.  The patient was assessed for potential [facility]) Care Management service needs for post hospital transition for care coordination. Review of patient's electronic medical record reveals patient was admitted with Acute Cholecystitis. Anticipate pt will received a hospital follow up call.  Plan: [facility] Liaison will continue to follow progress and disposition to asess for post hospital community care coordination/management needs.  Referral request for community care coordination: anticipate [facility] Transitions of Care Team follow up.   [facility] Care Management/Population Health does not replace or interfere with any arrangements made by the Inpatient Transition of Care team.   For questions contact:   Elliot Cousin, RN, BSN [facility] Liaison [place]   [facility]  Population Health Office Hours MTWF  8:00 am-6:00 pm Off on Thursday [phone] mobile [phone] [Office toll free line][facility] Office Hours are M-F 8:30 - 5 pm 24 hour nurse advise line [phone] Concierge  [name]

## 2022-10-13 NOTE — Evaluation (Signed)
Physical Therapy Evaluation Patient Details Name: Vanessa Farmer MRN: [id number] DOB: 10-02-1935 Today's Date: 10/13/2022  History of Present Illness  Vanessa Farmer is a 87 y.o. female with medical history significant of DVT and A-fib on Eliquis, hypertension, hyperlipidemia, CAD, dCHF, depression, CKD-3A, left bundle blockade, scoliosis, who presents with pain in epigastric area and lower chest. Patient states that awoke overnight with sudden onset of pain in epigastric area and lower chest.  The pain is constant, sharp, severe, nonradiating.  She also reports shortness of breath and mild dry cough.  No fever or chills.  Associated with multiple episodes of nausea, dry heaves, and nonbilious nonbloody vomiting.  No diarrhea.  Denies symptoms of UTI.  Clinical Impression  Pt is a pleasant 87 year old female who was admitted for acute cholecystitis. Pt performs bed mobility with mod assist and transfers with min assist and RW. Pt demonstrates deficits with strength/mobility/pain. Doesn't appear to be at baseline level at this time. Will continue to wean O2 as able. Would benefit from skilled PT to address above deficits and promote optimal return to PLOF. Pt will continue to receive skilled PT services while admitted and will defer to TOC/care team for updates regarding disposition planning.     Recommendations for follow up therapy are one component of a multi-disciplinary discharge planning process, led by the attending physician.  Recommendations may be updated based on patient status, additional functional criteria and insurance authorization.  Follow Up Recommendations Can patient physically be transported by private vehicle: No     Assistance Recommended at Discharge Intermittent Supervision/Assistance  Patient can return home with the following  A lot of help with walking and/or transfers;A lot of help with bathing/dressing/bathroom;Help with stairs or ramp for entrance    Equipment  Recommendations Rolling walker (2 wheels)  Recommendations for Other Services       Functional Status Assessment Patient has had a recent decline in their functional status and demonstrates the ability to make significant improvements in function in a reasonable and predictable amount of time.     Precautions / Restrictions Precautions Precautions: Fall Restrictions Weight Bearing Restrictions: No      Mobility  Bed Mobility Overal bed mobility: Needs Assistance Bed Mobility: Supine to Sit     Supine to sit: Mod assist     General bed mobility comments: Pt needs assist for B LE management and heavy assist for trunkal elevation. Once seated at EOB, pt reports increased pain with mobility, very emotional. All mobility performed on 2L of O2 with sats ranging from 88-90%.    Transfers Overall transfer level: Needs assistance Equipment used: Rolling walker (2 wheels) Transfers: Bed to chair/wheelchair/BSC     Step pivot transfers: Min assist       General transfer comment: able to step pivot over to recliner. Very effortful and painful with exertion. Unable to further ambulate at this time.    Ambulation/Gait               General Gait Details: unable  Stairs            Wheelchair Mobility    Modified Rankin (Stroke Patients Only)       Balance Overall balance assessment: Needs assistance Sitting-balance support: Feet supported Sitting balance-Leahy Scale: Good     Standing balance support: Bilateral upper extremity supported Standing balance-Leahy Scale: Fair  Pertinent Vitals/Pain Pain Assessment Pain Assessment: Faces Faces Pain Scale: Hurts whole lot Pain Location: chest/abdominal pain Pain Descriptors / Indicators: Operative site guarding Pain Intervention(s): Limited activity within patient's tolerance, Patient requesting pain meds-RN notified    Home Living Family/patient expects to be  discharged to:: Private residence Living Arrangements: Children Available Help at Discharge: Family;Available 24 hours/day Type of Home: Mobile home Home Access: Stairs to enter Entrance Stairs-Rails: Right;Left Entrance Stairs-Number of Steps: 3   Home Layout: One level Home Equipment: Rollator (4 wheels);Cane - single point      Prior Function Prior Level of Function : Independent/Modified Independent             Mobility Comments: reports she was previously using her SPC. Reports falls, however none recently ADLs Comments: Patient able to complete ADLs and medication management with mod I.  Her daughter provides assistance for cooking and transportation.     Hand Dominance   Dominant Hand: Right    Extremity/Trunk Assessment   Upper Extremity Assessment Upper Extremity Assessment: Generalized weakness (B UE grossly 3+/5)    Lower Extremity Assessment Lower Extremity Assessment: Generalized weakness (B LE grossly 3/5)       Communication   Communication: No difficulties  Cognition Arousal/Alertness: Awake/alert Behavior During Therapy: WFL for tasks assessed/performed Overall Cognitive Status: Within Functional Limits for tasks assessed                                 General Comments: pleasant and agreeable to session        General Comments      Exercises     Assessment/Plan    PT Assessment Patient needs continued PT services  PT Problem List Decreased strength;Decreased balance;Decreased mobility;Pain       PT Treatment Interventions DME instruction;Gait training;Therapeutic exercise;Balance training    PT Goals (Current goals can be found in the Care Plan section)  Acute Rehab PT Goals Patient Stated Goal: to go home PT Goal Formulation: With patient Time For Goal Achievement: 10/27/22 Potential to Achieve Goals: Good    Frequency Min 3X/week     Co-evaluation               AM-PAC PT "6 Clicks" Mobility  Outcome  Measure Help needed turning from your back to your side while in a flat bed without using bedrails?: A Lot Help needed moving from lying on your back to sitting on the side of a flat bed without using bedrails?: A Lot Help needed moving to and from a bed to a chair (including a wheelchair)?: A Lot Help needed standing up from a chair using your arms (e.g., wheelchair or bedside chair)?: A Lot Help needed to walk in hospital room?: Total Help needed climbing 3-5 steps with a railing? : Total 6 Click Score: 10    End of Session Equipment Utilized During Treatment: Oxygen Activity Tolerance: Patient limited by pain Patient left: in chair;with chair alarm set Nurse Communication: Mobility status PT Visit Diagnosis: Difficulty in walking, not elsewhere classified (R26.2);Pain;Unsteadiness on feet (R26.81);Muscle weakness (generalized) (M62.81) Pain - Right/Left:  (abdominal) Pain - part of body:  (abdominal)    Time: 1308-6578 PT Time Calculation (min) (ACUTE ONLY): 25 min   Charges:   PT Evaluation $PT Eval Moderate Complexity: 1 [address], PT, DPT, GCS [phone]   Lawton Dollinger 10/13/2022, 4:20 PM

## 2022-10-13 NOTE — Consult Note (Signed)
ANTICOAGULATION CONSULT NOTE  Pharmacy Consult for Heparin Infusion Indication: atrial fibrillation  Patient Measurements: Height: 5' (152.4 cm) Weight: 52.5 kg (115 lb 11.9 oz) IBW/kg (Calculated) : 45.5 Heparin Dosing Weight: 44.9 kg  Labs: Recent Labs    10/11/22 0228 10/11/22 0413 10/11/22 0823 10/11/22 1807 10/12/22 0242 10/12/22 1056 10/13/22 0020  HGB 12.2  --   --   --  11.1*  --   --   HCT 37.5  --   --   --  33.9*  --   --   PLT 202  --   --   --  162  --   --   APTT  --   --  27  --   --   --   --   LABPROT 14.4  --   --   --   --   --   --   INR 1.1  --   --   --   --   --   --   HEPARINUNFRC  --   --  <0.10*   < > 0.32 <0.10* 0.17*  CREATININE 0.99  --   --   --  0.99  --   --   TROPONINIHS 11 12  --   --   --   --   --    < > = values in this interval not displayed.    Estimated Creatinine Clearance: 29.8 mL/min (by C-G formula based on SCr of 0.99 mg/dL).  Medical History: Past Medical History:  Diagnosis Date   Anemia    Cataract    Depression    Fall    GERD (gastroesophageal reflux disease)    Hyperlipidemia    Hypertension    Myocardial infarction [facility])    "mild" many yrs ago    Medications:  Patient reportedly on apixaban prior to admission for history of VTE and Afib Dispense report shows last fill 05/17/22 for 14 day supply Baseline heparin level < 0.1, not consistent with active apixaban usage where interference is usually observed  Assessment: 87 y.o. female with medical history significant of DVT and A-fib on Eliquis. Presented with with chest pain, epigastric abdominal pain. CT was negative for PE. [place] c/f acute cholecystis. Pharmacy consulted for transition from apixaban to heparin for possible planned procedures.  Baseline aPTT 27s, INR 1.1, heparin level <0.1. Baseline CBC within normal limits.  0603 1803  HL 0.17; 650 un/hr 0604 0242  HL 0.32, 800 un/hr 0604 1056 HL <0.1; 800 un/hr 0605 0020 HL 0.17, SUBtherapeutic   Goal of  Therapy:  Heparin level 0.3-0.7 units/ml Monitor platelets by anticoagulation protocol: Yes   Plan:  06/05: HL @ 0020 = 0.17, SUBtherapeutic  - Will order heparin 1350 units IV X 1 bolus and increase drip rate to 950 units/hr. - Will recheck HL 8 hrs after rate change  Daily CBC while on heparin  Eryx Zane D, PharmD 10/13/2022 12:56 AM

## 2022-10-13 NOTE — Consult Note (Signed)
ANTICOAGULATION CONSULT NOTE  Pharmacy Consult for Heparin Infusion Indication: atrial fibrillation  Patient Measurements: Height: 5' (152.4 cm) Weight: 53.7 kg (118 lb 6.2 oz) IBW/kg (Calculated) : 45.5 Heparin Dosing Weight: 53.7 kg  Labs: Recent Labs    10/11/22 0228 10/11/22 0413 10/11/22 0823 10/11/22 1807 10/12/22 0242 10/12/22 1056 10/13/22 0020 10/13/22 0342 10/13/22 0950  HGB 12.2  --   --   --  11.1*  --   --  11.2*  --   HCT 37.5  --   --   --  33.9*  --   --  33.8*  --   PLT 202  --   --   --  162  --   --  160  --   APTT  --   --  27  --   --   --   --   --   --   LABPROT 14.4  --   --   --   --   --   --   --   --   INR 1.1  --   --   --   --   --   --   --   --   HEPARINUNFRC  --   --  <0.10*   < > 0.32 <0.10* 0.17*  --  0.27*  CREATININE 0.99  --   --   --  0.99  --   --  0.92  --   TROPONINIHS 11 12  --   --   --   --   --   --   --    < > = values in this interval not displayed.    Estimated Creatinine Clearance: 32.1 mL/min (by C-G formula based on SCr of 0.92 mg/dL).  Medical History: Past Medical History:  Diagnosis Date   Anemia    Cataract    Depression    Fall    GERD (gastroesophageal reflux disease)    Hyperlipidemia    Hypertension    Myocardial infarction [facility])    "mild" many yrs ago    Medications:  Patient reportedly on apixaban prior to admission for history of VTE and Afib Dispense report shows last fill 05/17/22 for 14 day supply Baseline heparin level < 0.1, not consistent with active apixaban usage where interference is usually observed  Assessment: 87 y.o. female with medical history significant of DVT and A-fib on Eliquis. Presented with with chest pain, epigastric abdominal pain. CT was negative for PE. [place] c/f acute cholecystis. Pharmacy consulted for transition from apixaban to heparin for possible planned procedures.  Baseline aPTT 27s, INR 1.1, heparin level <0.1. Baseline CBC within normal limits.  0603 1803  HL  0.17; 650 un/hr 0604 0242  HL 0.32, 800 un/hr 0604 1056 HL <0.1; 800 un/hr 0605 0020 HL 0.17, 800 un/hr 0605 0950 HL 0.27; 950 un/hr  Goal of Therapy:  Heparin level 0.3-0.7 units/ml Monitor platelets by anticoagulation protocol: Yes   Plan:  --Heparin level is subtherapeutic --Heparin 800 unit IV bolus and increase heparin infusion rate to 1050 units/hr --Will recheck HL 8 hrs after rate change  --Daily CBC while on heparin  Tressie Ellis 10/13/2022 10:54 AM

## 2022-10-13 NOTE — Progress Notes (Signed)
Progress Note   Patient: Vanessa Farmer ZOX:[id number] DOB: 1936-01-21 DOA: 10/11/2022     2 DOS: the patient was seen and examined on 10/13/2022   Brief hospital course: Vanessa Farmer is a 87 y.o. female with medical history significant of DVT and A-fib on Eliquis, hypertension, hyperlipidemia, CAD, dCHF, depression, CKD-3A, left bundle blockade, scoliosis, who presents with pain in epigastric area and lower chest  Vital signs showed a heart rate of 111, respirate is 32.  Ultrasound showed gallstone with cholecystitis.  Patient was placed on Zosyn, IR drain performed 6/4.  6/5: Discontinue telemetry, left ankle x-ray negative for acute pathology, started on full liquid diet, PT and OT eval, transition to Eliquis and discontinue heparin.  Added ibuprofen for ankle pain.  Wound care nurse evaluation for left ankle wound/scab  Assessment and Plan:  Acute cholecystitis and cholelithiasis:  Status post percutaneous cholecystostomy tube under IR.  She will need to keep drain for minimum 6 to 8 weeks prior to consideration for cholecystectomy per surgical team.  Continue IV Zosyn for now.  Full liquid diet started per surgery today   CAD (coronary artery disease):  Patient stable. Negative troponin.   History of DVT of lower extremity Discontinue heparin and transition back to Eliquis   (HFpEF) heart failure with preserved ejection fraction [facility]):  No evidence of volume overload at this time.   Atrial fibrillation, chronic (HCC):  Continue anticoagulation.  Condition stable.   Essential hypertension Continue home medicines.   Chronic kidney disease, stage 3a [facility]):  Renal function stable.   QT prolongation:  Continue to follow.   Protein-calorie malnutrition, moderate (HCC):  Continue protein supplement.   Left ankle pain I will put her dressing and could find black eschar/scab.  She expresses a lot of pain there but I do not find significant tenderness.  Will get x-ray as she still  declines any fall or injury to this area.  Unable to correlate her pain level with the objective finding -Will get [facility] nurse consult.  X-ray of the area and add ibuprofen 400 mg 4 times daily for 2 days to see if that helps control her pain better      Subjective: Expressing 10 out of 10 pain in her left ankle where she has a scab and has dressing in place  Physical Exam: Vitals:   10/13/22 0444 10/13/22 0448 10/13/22 0800 10/13/22 1500  BP: (!) 129/59  (!) 141/57 (!) 106/42  Pulse: 80  84 70  Resp: 10  16 16   Temp: 98.2 F (36.8 C)  97.8 F (36.6 C) 98.3 F (36.8 C)  TempSrc: Oral  Oral Oral  SpO2: 94%     Weight:  53.7 kg    Height:       General exam: Appears calm and comfortable  Respiratory system: Clear to auscultation. Respiratory effort normal. Cardiovascular system: Irregular. No JVD, murmurs, rubs, gallops or clicks. No pedal edema. Gastrointestinal system: Abdomen is nondistended, soft and RUQ tender. No organomegaly or masses felt. Normal bowel sounds heard. Central nervous system: Alert and oriented x3. No focal neurological deficits. Extremities: Symmetric 5 x 5 power. Skin: Left ankle scab under the foam dressing.  I do not see any signs of infection or swelling.  Could not find significant tenderness to correlate with the pain she is describing Psychiatry: Judgement and insight appear normal. Mood & affect appropriate.  Data Reviewed:  Elevated LFTs.  Left ankle x-ray shows no acute pathology  Family Communication: None at  bedside  Disposition: Status is: Inpatient Remains inpatient appropriate because: Management of acute cholecystitis  Planned Discharge Destination: Home with Home Health   DVT prophylaxis-Eliquis Time spent: 35 minutes  Author: Delfino Lovett, MD 10/13/2022 4:55 PM  For on call review [email or web address].

## 2022-10-14 DIAGNOSIS — K810 Acute cholecystitis: Secondary | ICD-10-CM | POA: Diagnosis not present

## 2022-10-14 DIAGNOSIS — K8000 Calculus of gallbladder with acute cholecystitis without obstruction: Secondary | ICD-10-CM | POA: Diagnosis not present

## 2022-10-14 DIAGNOSIS — N1831 Chronic kidney disease, stage 3a: Secondary | ICD-10-CM

## 2022-10-14 DIAGNOSIS — E440 Moderate protein-calorie malnutrition: Secondary | ICD-10-CM | POA: Diagnosis not present

## 2022-10-14 LAB — BASIC METABOLIC PANEL
Anion gap: 7 (ref 5–15)
BUN: 16 mg/dL (ref 8–23)
CO2: 25 mmol/L (ref 22–32)
Calcium: 8 mg/dL — ABNORMAL LOW (ref 8.9–10.3)
Chloride: 102 mmol/L (ref 98–111)
Creatinine, Ser: 1.03 mg/dL — ABNORMAL HIGH (ref 0.44–1.00)
GFR, Estimated: 53 mL/min — ABNORMAL LOW (ref >60)
Glucose, Bld: 107 mg/dL — ABNORMAL HIGH (ref 70–99)
Potassium: 3.6 mmol/L (ref 3.5–5.1)
Sodium: 134 mmol/L — ABNORMAL LOW (ref 135–145)

## 2022-10-14 LAB — CBC
HCT: 30.1 % — ABNORMAL LOW (ref 36.0–46.0)
Hemoglobin: 10.1 g/dL — ABNORMAL LOW (ref 12.0–15.0)
MCH: 33.2 pg (ref 26.0–34.0)
MCHC: 33.6 g/dL (ref 30.0–36.0)
MCV: 99 fL (ref 80.0–100.0)
Platelets: 144 10*3/uL — ABNORMAL LOW (ref 150–400)
RBC: 3.04 MIL/uL — ABNORMAL LOW (ref 3.87–5.11)
RDW: 13.1 % (ref 11.5–15.5)
WBC: 5.2 10*3/uL (ref 4.0–10.5)
nRBC: 0 % (ref 0.0–0.2)

## 2022-10-14 MED ORDER — ENSURE ENLIVE PO LIQD: 237.0000 mL | Freq: Two times a day (BID) | ORAL | Status: DC

## 2022-10-14 MED ORDER — SODIUM CHLORIDE 0.9% FLUSH: 5.0000 mL | Freq: Every day | INTRAVENOUS | 1 refills | Status: AC

## 2022-10-14 MED ORDER — POLYETHYLENE GLYCOL 3350 17 G PO PACK
17.0000 g | PACK | Freq: Every day | ORAL | Status: DC
  Administered 2022-10-14 – 2022-10-15 (×2): 17 g via ORAL
  Filled 2022-10-14 (×2): qty 1

## 2022-10-14 MED ORDER — SENNOSIDES-DOCUSATE SODIUM 8.6-50 MG PO TABS
1.0000 | ORAL_TABLET | Freq: Two times a day (BID) | ORAL | Status: DC
  Administered 2022-10-14 – 2022-10-15 (×3): 1 via ORAL
  Filled 2022-10-14 (×3): qty 1

## 2022-10-14 NOTE — Evaluation (Signed)
Occupational Therapy Evaluation Patient Details Name: Vanessa Farmer MRN: [id number] DOB: 13-Jul-1935 Today's Date: 10/14/2022   History of Present Illness Vanessa Farmer is a 87 y.o. female with medical history significant of DVT and A-fib on Eliquis, hypertension, hyperlipidemia, CAD, dCHF, depression, CKD-3A, left bundle blockade, scoliosis, who presents with pain in epigastric area and lower chest. Patient states that awoke overnight with sudden onset of pain in epigastric area and lower chest.  The pain is constant, sharp, severe, nonradiating.  She also reports shortness of breath and mild dry cough.  No fever or chills.  Associated with multiple episodes of nausea, dry heaves, and nonbilious nonbloody vomiting.  No diarrhea.  Denies symptoms of UTI.   Clinical Impression   Pt was seen for OT evaluation this date. Prior to hospital admission, pt was generally independent with BADL management, using a SPC for functional mobility. She endorses rotating between living with each of her children t/o the year and her daughter primarily assists her with IADL management including cooking, shopping, and transportation. Pt presents to acute OT demonstrating impaired ADL performance and functional mobility 2/2 generalized weakness, decreased LB access, and increased pain with mobility (See OT problem list for additional functional deficits). Pt currently requires MOD A for LB ADL management and bed mobility, MIN A for STS and MIN GUARD to SPT to recliner.  Pt would benefit from skilled OT services to address noted impairments and functional limitations (see below for any additional details) in order to maximize safety and independence while minimizing falls risk and caregiver burden. Anticipate the need for follow up OT services upon acute hospital DC.       Recommendations for follow up therapy are one component of a multi-disciplinary discharge planning process, led by the attending physician.   Recommendations may be updated based on patient status, additional functional criteria and insurance authorization.   Assistance Recommended at Discharge Intermittent Supervision/Assistance  Patient can return home with the following A lot of help with bathing/dressing/bathroom;Assistance with cooking/housework;Help with stairs or ramp for entrance;Assist for transportation;A lot of help with walking and/or transfers    Functional Status Assessment  Patient has had a recent decline in their functional status and demonstrates the ability to make significant improvements in function in a reasonable and predictable amount of time.  Equipment Recommendations  BSC/3in1;Other (comment) (2WW)    Recommendations for Other Services       Precautions / Restrictions Precautions Precautions: Fall Restrictions Weight Bearing Restrictions: No      Mobility Bed Mobility Overal bed mobility: Needs Assistance Bed Mobility: Supine to Sit     Supine to sit: Mod assist          Transfers Overall transfer level: Needs assistance Equipment used: Rolling walker (2 wheels) Transfers: Bed to chair/wheelchair/BSC       Step pivot transfers: Min guard     General transfer comment: able to step pivot over to recliner. Very effortful and painful with exertion.      Balance Overall balance assessment: Needs assistance Sitting-balance support: Feet supported, No upper extremity supported Sitting balance-Leahy Scale: Good Sitting balance - Comments: steady sitting without support at EOB   Standing balance support: Bilateral upper extremity supported, Reliant on assistive device for balance, During functional activity Standing balance-Leahy Scale: Fair                             ADL either performed or assessed with clinical judgement  ADL Overall ADL's : Needs assistance/impaired                                       General ADL Comments: Pt is functionally  limited by increased pain with mobility, generalized weakness, and decreased balance. She requires MOD A for bed mobility and LB dressing, MIN A for STS from EOB with RW, and close min guard for SPT to recliner. She is able to perform UB ADL management once seated in recliener with SET UP assist.     Vision Baseline Vision/History: 1 Wears glasses Ability to See in Adequate Light: 1 Impaired Patient Visual Report: No change from baseline       Perception     Praxis      Pertinent Vitals/Pain Pain Assessment Pain Assessment: 0-10 Pain Score: 10-Worst pain ever Pain Location: chest/abdominal pain Pain Descriptors / Indicators: Tightness, Guarding, Grimacing Pain Intervention(s): Limited activity within patient's tolerance, Monitored during session, Repositioned, Patient requesting pain meds-RN notified     Hand Dominance Right   Extremity/Trunk Assessment Upper Extremity Assessment Upper Extremity Assessment: Generalized weakness   Lower Extremity Assessment Lower Extremity Assessment: Generalized weakness   Cervical / Trunk Assessment Cervical / Trunk Assessment: Kyphotic   Communication Communication Communication: No difficulties   Cognition Arousal/Alertness: Awake/alert Behavior During Therapy: WFL for tasks assessed/performed Overall Cognitive Status: Within Functional Limits for tasks assessed                                 General Comments: pleasant and agreeable to session     General Comments  VSS t/o session, pt HR remains 60's-70's with activity. SpO2 remains 90-93% t/o session with pt on 2L [place]. Abdominal drain in place, CDI at start/end of session.    Exercises Other Exercises Other Exercises: Pt educated on role of OT in acute setting, falls prevention strategies, DC recs, routines modifications to support safety and functional independence during ADL management.   Shoulder Instructions      Home Living Family/patient expects to be  discharged to:: Private residence Living Arrangements: Children Available Help at Discharge: Family;Available 24 hours/day (reports she rotates between her own home and her daughters regularly.) Type of Home: Mobile home Home Access: Stairs to enter Entrance Stairs-Number of Steps: 3 Entrance Stairs-Rails: Right;Left Home Layout: One level     Bathroom Shower/Tub: Sponge bathes at baseline   [employer]: Standard     Home Equipment: Rollator (4 wheels);Cane - single point;Shower seat - built in          Prior Functioning/Environment Prior Level of Function : Independent/Modified Independent             Mobility Comments: reports she was previously using her SPC. Reports falls, however none recently ADLs Comments: Patient able to complete ADLs and medication management with mod I.  Her daughter provides assistance for cooking, shopping, and transportation.        OT Problem List: Decreased strength;Decreased coordination;Decreased activity tolerance;Decreased safety awareness;Decreased knowledge of use of DME or AE;Impaired UE functional use      OT Treatment/Interventions: Self-care/ADL training;Therapeutic exercise;Therapeutic activities;Balance training;DME and/or AE instruction;Patient/family education    OT Goals(Current goals can be found in the care plan section) Acute Rehab OT Goals Patient Stated Goal: To get stronger OT Goal Formulation: With patient Time For Goal Achievement: 10/28/22 Potential to  Achieve Goals: Good ADL Goals Pt Will Perform Grooming: with modified independence;standing Pt Will Perform Lower Body Dressing: sit to/from stand;with set-up;with supervision;with adaptive equipment Pt Will Transfer to Toilet: bedside commode;ambulating;with set-up;with supervision Pt Will Perform Toileting - Clothing Manipulation and hygiene: sit to/from stand;with supervision;with set-up  OT Frequency: Min 2X/week    Co-evaluation               AM-PAC OT "6 Clicks" Daily Activity     Outcome Measure Help from another person eating meals?: A Little Help from another person taking care of personal grooming?: A Little Help from another person toileting, which includes using toliet, bedpan, or urinal?: A Little Help from another person bathing (including washing, rinsing, drying)?: A Lot Help from another person to put on and taking off regular upper body clothing?: A Little Help from another person to put on and taking off regular lower body clothing?: A Lot 6 Click Score: 16   End of Session Equipment Utilized During Treatment: Gait belt;Rolling walker (2 wheels) Nurse Communication: Patient requests pain meds  Activity Tolerance: Patient tolerated treatment well Patient left: in chair;with call bell/phone within reach;with chair alarm set  OT Visit Diagnosis: Other abnormalities of gait and mobility (R26.89);Muscle weakness (generalized) (M62.81);Pain Pain - Right/Left:  (both) Pain - part of body:  (abdomen/chest)                Time: 1016-1040 OT Time Calculation (min): 24 min Charges:  OT General Charges $OT Visit: 1 Visit OT Evaluation $OT Eval Low Complexity: 1 Low OT Treatments $Self Care/Home Management : 8-22 mins  Rockney Ghee, M.S., OTR/L 10/14/22, 12:40 PM

## 2022-10-14 NOTE — NC FL2 (Signed)
[place] MEDICAID FL2 LEVEL OF CARE FORM     IDENTIFICATION  Patient Name: Vanessa Farmer Birthdate: [birth year] Sex: female Admission Date (Current Location): 10/11/2022  [facility] and [place] Number:  Chiropodist and Address:         Provider Number: [id number]  Attending Physician Name and Address:  Delfino Lovett, MD  Relative Name and Phone Number:       Current Level of Care: Hospital Recommended Level of Care: Skilled Nursing Facility Prior Approval Number:    Date Approved/Denied:   PASRR Number: [id number]  Discharge Plan: SNF    Current Diagnoses: Patient Active Problem List   Diagnosis Date Noted   Cholelithiasis 10/11/2022   Chronic kidney disease, stage 3a (HCC) 10/11/2022   Protein-calorie malnutrition, moderate (HCC) 10/11/2022   Chest pain 10/11/2022   Atrial fibrillation, chronic (HCC) 10/11/2022   Depression 10/11/2022   Acute cholecystitis 10/11/2022   QT prolongation 10/11/2022   History of DVT of lower extremity 04/05/2022   (HFpEF) heart failure with preserved ejection fraction (HCC) 04/05/2022   Atrial fibrillation with RVR (HCC) 04/04/2022   Hypokalemia 12/31/2021   Aortic atherosclerosis (HCC) 10/20/2020   Ganglion cyst of wrist, right 03/20/2019   Thrombocytopenia (HCC) 07/19/2018   Iron deficiency anemia    Polyp of sigmoid colon    Pancytopenia (HCC) 09/30/2017   GERD (gastroesophageal reflux disease) 06/10/2017   CKD (chronic kidney disease) stage 3, GFR 30-59 ml/min (HCC) 06/08/2016   Hyperlipidemia 06/08/2016   Scoliosis 06/07/2016   Osteoporosis 06/07/2016   Essential hypertension 06/07/2016   CAD (coronary artery disease) 06/07/2016   Depression, major, single episode, moderate (HCC) 06/07/2016    Orientation RESPIRATION BLADDER Height & Weight     Self, Time, Situation, Place  Tracheostomy (2L [place]) Incontinent Weight: 54.4 kg Height:  5' (152.4 cm)  BEHAVIORAL SYMPTOMS/MOOD NEUROLOGICAL BOWEL NUTRITION STATUS       Continent Diet (Soft)  AMBULATORY STATUS COMMUNICATION OF NEEDS Skin   Extensive Assist Verbally Other (Comment) (Biliary tube)                       Personal Care Assistance Level of Assistance              Functional Limitations Info             SPECIAL CARE FACTORS FREQUENCY  PT (By licensed PT), OT (By licensed OT)                    Contractures Contractures Info: Not present    Additional Factors Info  Code Status, Allergies Code Status Info: Full Allergies Info: NKDA           Current Medications (10/14/2022):  This is the current hospital active medication list Current Facility-Administered Medications  Medication Dose Route Frequency Provider Last Rate Last Admin   0.9 %  sodium chloride infusion   Intravenous Continuous Lorretta Harp, MD 75 mL/hr at 10/14/22 0557 New Bag at 10/14/22 0557   0.9 %  sodium chloride infusion   Intravenous PRN Lorretta Harp, MD   Stopped at 10/11/22 2144   acetaminophen (TYLENOL) tablet 650 mg  650 mg Oral Q6H PRN Lorretta Harp, MD   650 mg at 10/14/22 1503   amLODipine (NORVASC) tablet 10 mg  10 mg Oral Daily Lorretta Harp, MD   10 mg at 10/14/22 0908   apixaban (ELIQUIS) tablet 2.5 mg  2.5 mg Oral BID Delfino Lovett, MD   2.5  mg at 10/14/22 0908   atorvastatin (LIPITOR) tablet 40 mg  40 mg Oral Daily Lorretta Harp, MD   40 mg at 10/14/22 0981   diphenhydrAMINE (BENADRYL) injection 12.5 mg  12.5 mg Intravenous Q8H PRN Lorretta Harp, MD   12.5 mg at 10/11/22 1019   [START ON 10/15/2022] feeding supplement (ENSURE ENLIVE / ENSURE PLUS) liquid 237 mL  237 mL Oral BID BM Delfino Lovett, MD       hydrALAZINE (APRESOLINE) injection 10 mg  10 mg Intravenous Q2H PRN Lorretta Harp, MD       ibuprofen (ADVIL) tablet 400 mg  400 mg Oral QID Delfino Lovett, MD   400 mg at 10/14/22 1248   lisinopril (ZESTRIL) tablet 20 mg  20 mg Oral Daily Lorretta Harp, MD   20 mg at 10/14/22 0908   metoCLOPramide (REGLAN) injection 5 mg  5 mg Intravenous Q6H PRN Mansy, Jan A, MD        metoprolol tartrate (LOPRESSOR) injection 5 mg  5 mg Intravenous Q2H PRN Lorretta Harp, MD   5 mg at 10/12/22 2057   morphine (PF) 2 MG/ML injection 1 mg  1 mg Intravenous Q3H PRN Lorretta Harp, MD   1 mg at 10/14/22 1914   multivitamin with minerals tablet 1 tablet  1 tablet Oral Daily Angelique Blonder, RPH   1 tablet at 10/14/22 7829   neomycin-bacitracin-polymyxin (NEOSPORIN) ointment   Topical Daily Delfino Lovett, MD   Given at 10/14/22 1253   ondansetron (ZOFRAN) injection 4 mg  4 mg Intravenous Q4H PRN Mansy, Jan A, MD   4 mg at 10/13/22 2020   oxyCODONE-acetaminophen (PERCOCET/ROXICET) 5-325 MG per tablet 1 tablet  1 tablet Oral Q4H PRN Lorretta Harp, MD   1 tablet at 10/14/22 0556   pantoprazole (PROTONIX) EC tablet 40 mg  40 mg Oral Daily Lorretta Harp, MD   40 mg at 10/14/22 0840   piperacillin-tazobactam (ZOSYN) IVPB 3.375 g  3.375 g Intravenous Q8H Cheron Every E, RPH 12.5 mL/hr at 10/14/22 1250 3.375 g at 10/14/22 1250   polyethylene glycol (MIRALAX / GLYCOLAX) packet 17 g  17 g Oral Daily Delfino Lovett, MD   17 g at 10/14/22 1248   senna-docusate (Senokot-S) tablet 1 tablet  1 tablet Oral BID Delfino Lovett, MD   1 tablet at 10/14/22 1248   sodium chloride flush (NS) 0.9 % injection 5 mL  5 mL Intracatheter Q8H El-Abd, Aaron Edelman, MD   5 mL at 10/14/22 1255     Discharge Medications: Please see discharge summary for a list of discharge medications.  Relevant Imaging Results:  Relevant Lab Results:   Additional Information ss [id number]  Chapman Fitch, RN

## 2022-10-14 NOTE — Progress Notes (Signed)
Physical Therapy Treatment Patient Details Name: Vanessa Farmer MRN: [id number] DOB: 05-01-1936 Today's Date: 10/14/2022   History of Present Illness Vanessa Farmer is a 87 y.o. female with medical history significant of DVT and A-fib on Eliquis, hypertension, hyperlipidemia, CAD, dCHF, depression, CKD-3A, left bundle blockade, scoliosis, who presents with pain in epigastric area and lower chest. Patient states that awoke overnight with sudden onset of pain in epigastric area and lower chest.  The pain is constant, sharp, severe, nonradiating.  She also reports shortness of breath and mild dry cough.  No fever or chills.  Associated with multiple episodes of nausea, dry heaves, and nonbilious nonbloody vomiting.  No diarrhea.  Denies symptoms of UTI.    PT Comments    Patient sitting up in recliner upon arrival. [place] she was ready to try moving today but wasn't sure if she could make it to the door. Patient was mod assist with RW for sit/stand at recliner. Practiced standing for 1 minute to work on increasing endurance to prepare for ambulation. Short rest break needed before attempting to stand again with mod assist. Patient unable to continue session after 2 trials due to pain and fatigue. O2 utilized during session at 2L/min to maintain spo2 above 90. During activity patient Spo2 dropped for 87 and recovered with PLB. Patient left in chair with chair alarm, call bell and phone. Patient will continue to benefit from skilled therapy intervention due to significant deficits from baseline.    Recommendations for follow up therapy are one component of a multi-disciplinary discharge planning process, led by the attending physician.  Recommendations may be updated based on patient status, additional functional criteria and insurance authorization.  Follow Up Recommendations  Can patient physically be transported by private vehicle: No    Assistance Recommended at Discharge Intermittent  Supervision/Assistance  Patient can return home with the following A lot of help with walking and/or transfers;A lot of help with bathing/dressing/bathroom;Help with stairs or ramp for entrance   Equipment Recommendations  Rolling walker (2 wheels)    Recommendations for Other Services       Precautions / Restrictions Precautions Precautions: Fall Restrictions Weight Bearing Restrictions: No     Mobility  Bed Mobility               General bed mobility comments: not tested since patient was in recliner upon arrival.    Transfers   Equipment used: Rolling walker (2 wheels) Transfers: Sit to/from Stand Sit to Stand: Mod assist           General transfer comment: Sit to stand from recliner to practice standing endurance.    Ambulation/Gait               General Gait Details: unable   Stairs             Wheelchair Mobility    Modified Rankin (Stroke Patients Only)       Balance Overall balance assessment: Needs assistance Sitting-balance support: Feet supported, No upper extremity supported   Sitting balance - Comments: steady sitting in recliner with no UE support   Standing balance support: Bilateral upper extremity supported, Reliant on assistive device for balance, During functional activity Standing balance-Leahy Scale: Fair Standing balance comment: Patient needs min assist when standing with RW due to fatigue and pain.                            Cognition Arousal/Alertness: Awake/alert  Behavior During Therapy: WFL for tasks assessed/performed Overall Cognitive Status: Within Functional Limits for tasks assessed                                 General Comments: pleasant and agreeable to session        Exercises General Exercises - Lower Extremity Long Arc Quad: AAROM, Both, 10 reps, Seated Other Exercises Other Exercises: sit to stand x 2; standing 1 minute with min assist for standing  endurance.    General Comments General comments (skin integrity, edema, etc.): VSS t/o session, pt HR remains 60's-70's with activity. SpO2 remains 90-93% t/o session with pt on 2L [place]. Abdominal drain in place, CDI at start/end of session.      Pertinent Vitals/Pain Pain Assessment Pain Assessment: 0-10 Pain Score: 8  Faces Pain Scale: Hurts even more Pain Descriptors / Indicators: Tightness, Guarding, Grimacing Pain Intervention(s): Limited activity within patient's tolerance, Monitored during session, Premedicated before session    Home Living Family/patient expects to be discharged to:: Private residence Living Arrangements: Children Available Help at Discharge: Family;Available 24 hours/day (reports she rotates between her own home and her daughters regularly.) Type of Home: Mobile home Home Access: Stairs to enter Entrance Stairs-Rails: Right;Left Entrance Stairs-Number of Steps: 3   Home Layout: One level Home Equipment: Rollator (4 wheels);Cane - single point;Shower seat - built in      Prior Function            PT Goals (current goals can now be found in the care plan section) Acute Rehab PT Goals Patient Stated Goal: to go home PT Goal Formulation: With patient Time For Goal Achievement: 10/27/22 Potential to Achieve Goals: Good Progress towards PT goals: Progressing toward goals    Frequency    Min 3X/week      PT Plan Current plan remains appropriate    Co-evaluation              AM-PAC PT "6 Clicks" Mobility   Outcome Measure  Help needed turning from your back to your side while in a flat bed without using bedrails?: A Lot Help needed moving from lying on your back to sitting on the side of a flat bed without using bedrails?: A Lot Help needed moving to and from a bed to a chair (including a wheelchair)?: A Lot Help needed standing up from a chair using your arms (e.g., wheelchair or bedside chair)?: A Lot Help needed to walk in hospital  room?: Total Help needed climbing 3-5 steps with a railing? : Total 6 Click Score: 10    End of Session Equipment Utilized During Treatment: Oxygen Activity Tolerance: Patient limited by pain Patient left: in chair;with chair alarm set Nurse Communication: Mobility status PT Visit Diagnosis: Difficulty in walking, not elsewhere classified (R26.2);Pain;Unsteadiness on feet (R26.81);Muscle weakness (generalized) (M62.81) Pain - Right/Left: Right Pain - part of body:  (side/back)     Time: 8295-6213 PT Time Calculation (min) (ACUTE ONLY): 28 min  Charges:  $Therapeutic Exercise: 23-37 mins                     Malachi Carl, SPT    Jazman Reuter 10/14/2022, 2:08 PM

## 2022-10-14 NOTE — Progress Notes (Incomplete)
Progress Note   Patient: Vanessa Farmer WUJ:[id number] DOB: 02-04-1936 DOA: 10/11/2022     3 DOS: the patient was seen and examined on 10/14/2022   Brief hospital course: KAMAIRA SIMICH is a 87 y.o. female with medical history significant of DVT and A-fib on Eliquis, hypertension, hyperlipidemia, CAD, dCHF, depression, CKD-3A, left bundle blockade, scoliosis, who presents with pain in epigastric area and lower chest  Vital signs showed a heart rate of 111, respirate is 32.  Ultrasound showed gallstone with cholecystitis.  Patient was placed on Zosyn, IR drain performed 6/4.  6/5: Discontinue telemetry, left ankle x-ray negative for acute pathology, started on full liquid diet, PT and OT eval, transition to Eliquis and discontinue heparin.  Added ibuprofen for ankle pain.  Wound care nurse evaluation for left ankle wound/scab 6/6: Palliative care consult, added Senokot and MiraLAX  Assessment and Plan:  Acute cholecystitis and cholelithiasis:  Status post percutaneous cholecystostomy tube under IR.  She will need to keep drain for 8 weeks prior to consideration for cholecystectomy per surgical team.  Continue IV Zosyn for now.  Full liquid diet started per surgery today   CAD (coronary artery disease):  Patient stable. Negative troponin.   History of DVT of lower extremity Discontinue heparin and transition back to Eliquis   (HFpEF) heart failure with preserved ejection fraction [facility]):  No evidence of volume overload at this time.   Atrial fibrillation, chronic (HCC):  Continue anticoagulation.  Condition stable.   Essential hypertension Continue home medicines.   Chronic kidney disease, stage 3a [facility]):  Renal function stable.   QT prolongation:  Continue to follow.   Protein-calorie malnutrition, moderate (HCC):  Continue protein supplement.    Left ankle pain I will put her dressing and could find black eschar/scab.  She expresses a lot of pain there but I do not find  significant tenderness.  Will get x-ray as she still declines any fall or injury to this area.  Unable to correlate her pain level with the objective finding -Will get [facility] nurse consult.  X-ray of the area and add ibuprofen 400 mg 4 times daily for 2 days to see if that helps control her pain better   {Tip this will not be part of the note when signed Body mass index is 23.42 kg/m. ,  Nutrition Documentation    Flowsheet Row ED to Hosp-Admission (Current) from 10/11/2022 in [facility]  Nutrition Problem Inadequate oral intake  Etiology acute illness  Nutrition Goal Patient will meet greater than or equal to 90% of their needs     ,  (Optional):26781}  Subjective: ***  Physical Exam: Vitals:   10/13/22 2144 10/14/22 0500 10/14/22 0603 10/14/22 0745  BP: 126/64  (!) 141/53 (!) 131/41  Pulse: 73  65 68  Resp: 20  18 18   Temp: 97.7 F (36.5 C)  97.8 F (36.6 C) 97.9 F (36.6 C)  TempSrc:    Oral  SpO2: 94%  95% 95%  Weight:  54.4 kg    Height:       *** Data Reviewed: {Tip this will not be part of the note when signed- Document your independent interpretation of telemetry tracing, EKG, lab, Radiology test or any other diagnostic tests. Add any new diagnostic test ordered today. (Optional):26781} {Results:26384}  Family Communication: ***  Disposition: Status is: Inpatient {Inpatient:23812}  Planned Discharge Destination: {DISCHARGE DESTINATION_TRH:27031} {Tip this will not be part of the note when signed  DVT Prophylaxis  .  Apixaban (eliquis) tablet 2.5 mg , Apixaban (eliquis) tablet 2.5 mg  (Optional):26781}   Time spent: *** minutes  Author: Delfino Lovett, MD 10/14/2022 2:37 PM  For on call review [email or web address].

## 2022-10-14 NOTE — Progress Notes (Signed)
Cholecystostomy tube will need to stay in place 8 weeks OR until interval cholecystectomy. Follow-up with surgery, if deemed a poor surgical candidate at that time, IR can perform cholecystostomy tube exchanges every 8 weeks. If the patient is not a good surgical candidate for cholecystectomy in the future, choledochoscopic-guided percutaneous gallstone retrieval with eventual tube removal is available by IR upon request.   Drain catheter has retained suture within catheter, prior to removal of drain cut off the hub to release the retention suture forming the pigtail.  Outpatient follow-up order has been placed and our staff will reach out the patient to schedule 8 week cholangiogram and catheter exchange.  Pattricia Boss D, PA-C 10/14/2022, 10:10 AM

## 2022-10-14 NOTE — Progress Notes (Signed)
  Progress Note   Patient: Vanessa Farmer [id number]:[id number] DOB: 10-16-1935 DOA: 10/11/2022     3 DOS: the patient was seen and examined on 10/14/2022   Brief hospital course: Vanessa Farmer is a 87 y.o. female with medical history significant of DVT and A-fib on Eliquis, hypertension, hyperlipidemia, CAD, dCHF, depression, CKD-3A, left bundle blockade, scoliosis, who presents with pain in epigastric area and lower chest  Vital signs showed a heart rate of 111, respirate is 32.  Ultrasound showed gallstone with cholecystitis.  Patient was placed on Zosyn, IR drain performed 6/4.  6/5: Discontinue telemetry, left ankle x-ray negative for acute pathology, started on full liquid diet, PT and OT eval, transition to Eliquis and discontinue heparin.  Added ibuprofen for ankle pain.  Wound care nurse evaluation for left ankle wound/scab 6/6: Palliative care consult, added Senokot and MiraLAX  Assessment and Plan:  Acute cholecystitis and cholelithiasis:  Status post percutaneous cholecystostomy tube under IR.  She will need to keep drain for minimum 8 weeks prior to consideration for cholecystectomy per surgical team.  Continue IV Zosyn for now.  Full liquid diet tolerated so we will start soft diet today Output and follow-up with surgery and IR   CAD (coronary artery disease):  Patient stable. Negative troponin.   History of DVT of lower extremity Discontinue heparin and transition back to Eliquis   (HFpEF) heart failure with preserved ejection fraction [facility]):  No evidence of volume overload at this time.   Atrial fibrillation, chronic (HCC):  Continue anticoagulation.  Condition stable.   Essential hypertension Continue home medicines.   Chronic kidney disease, stage 3a [facility]):  Renal function stable.   QT prolongation:  Continue to follow.   Protein-calorie malnutrition, moderate (HCC):  Continue protein supplement.    Left ankle pain Nothing acute.  Ibuprofen seem to help.  X-ray  negative for any acute pathology     Subjective: Now complaining of chest pain  Physical Exam: Vitals:   10/13/22 2144 10/14/22 0500 10/14/22 0603 10/14/22 0745  BP: 126/64  (!) 141/53 (!) 131/41  Pulse: 73  65 68  Resp: 20  18 18   Temp: 97.7 F (36.5 C)  97.8 F (36.6 C) 97.9 F (36.6 C)  TempSrc:    Oral  SpO2: 94%  95% 95%  Weight:  54.4 kg    Height:       General exam: Appears calm and comfortable  Respiratory system: Clear to auscultation. Respiratory effort normal. Cardiovascular system: Irregular. No JVD, murmurs, rubs, gallops or clicks. No pedal edema. Gastrointestinal system: Abdomen is nondistended, soft and RUQ tender. No organomegaly or masses felt. Normal bowel sounds heard. Central nervous system: Alert and oriented x3. No focal neurological deficits. Extremities: Symmetric 5 x 5 power. Skin: Left ankle scab under the foam dressing.  I do not see any signs of infection or swelling.  Could not find significant tenderness to correlate with the pain she is describing Psychiatry: Judgement and insight appear normal. Mood & affect appropriate.  Data Reviewed:  There are no new results to review at this time.  Family Communication: I tried calling 2 numbers listed in the computer for daughter and granddaughter but no response  Disposition: Status is: Inpatient Remains inpatient appropriate because: Management of acute cholecystitis  Planned Discharge Destination: Home with Home Health   DVT prophylaxis-Eliquis Time spent: 35 minutes  Author: Delfino Lovett, MD 10/14/2022 2:49 PM  For on call review [email or web address].

## 2022-10-14 NOTE — Progress Notes (Addendum)
[facility] SURGICAL PROGRESS NOTE (cpt [phone])  Hospital Day(s): 3.   Interval History: Patient seen and examined, no acute events or new complaints overnight. Patient improvement in abdominal pain, now with soreness at drain itself. Only other complaint is weakness and dizziness. Some nausea. No fever, chills, emesis. Remains without leukocytosis; 5.2K. Hgb to 10.1. Renal function at baseline; sCr - 1.03; UO - 600 ccs + unmeasured. She did undergo percutaneous cholecystostomy tube placement with IR (06/04). Output recorded at 70 ccs; serosanguinous. There do not appear to be Cx from this. She is currently on Zosyn. On FLD; tolerating  Review of Systems:  Constitutional: denies fever, chills  HEENT: denies cough or congestion  Respiratory: denies any shortness of breath  Cardiovascular: denies chest pain or palpitations  Gastrointestinal: + abdominal pain (improved), denied N/V Genitourinary: denies burning with urination or urinary frequency Musculoskeletal: denies pain, decreased motor or sensation  Vital signs in last 24 hours: [min-max] current  Temp:  [97.7 F (36.5 C)-98.3 F (36.8 C)] 97.8 F (36.6 C) (06/06 0603) Pulse Rate:  [65-84] 65 (06/06 0603) Resp:  [16-20] 18 (06/06 0603) BP: (106-141)/(42-64) 141/53 (06/06 0603) SpO2:  [94 %-95 %] 95 % (06/06 0603) Weight:  [54.4 kg] 54.4 kg (06/06 0500)     Height: 5' (152.4 cm) Weight: 54.4 kg BMI (Calculated): 23.42   Intake/Output last 2 shifts:  06/05 0701 - 06/06 0700 In: 851.3 [I.V.:846.3] Out: 670 [Urine:600; Drains:70]   Physical Exam:  Constitutional: alert, cooperative and no distress  HENT: normocephalic without obvious abnormality  Eyes: PERRL, EOM's grossly intact and symmetric  Respiratory: breathing non-labored at rest  Cardiovascular: regular rate and sinus rhythm  Gastrointestinal: soft, RUQ pain improved; now just at drain site itself, non-distended, no rebound/guarding. Percutaneous  cholecystostomy tube in RUQ; output sanguinous  Musculoskeletal: no edema or wounds, motor and sensation grossly intact, NT    Labs:     Latest Ref Rng & Units 10/14/2022    4:41 AM 10/13/2022    3:42 AM 10/12/2022    2:42 AM  CBC  WBC 4.0 - 10.5 K/uL 5.2  7.9  7.2   Hemoglobin 12.0 - 15.0 g/dL 60.4  54.0  98.1   Hematocrit 36.0 - 46.0 % 30.1  33.8  33.9   Platelets 150 - 400 K/uL 144  160  162       Latest Ref Rng & Units 10/14/2022    4:41 AM 10/13/2022    3:42 AM 10/12/2022    2:42 AM  CMP  Glucose 70 - 99 mg/dL 191  478  295   BUN 8 - 23 mg/dL 16  15  13    Creatinine 0.44 - 1.00 mg/dL 6.21  3.08  6.57   Sodium 135 - 145 mmol/L 134  133  134   Potassium 3.5 - 5.1 mmol/L 3.6  3.6  4.2   Chloride 98 - 111 mmol/L 102  100  100   CO2 22 - 32 mmol/L 25  25  23    Calcium 8.9 - 10.3 mg/dL 8.0  8.1  8.4   Total Protein 6.5 - 8.1 g/dL  5.6    Total Bilirubin 0.3 - 1.2 mg/dL  1.3    Alkaline Phos 38 - 126 U/L  76    AST 15 - 41 U/L  107    ALT 0 - 44 U/L  188      Imaging studies: No new pertinent imaging studies   Assessment/Plan: (ICD-10's: K81.0) 87 y.o. female  with acute cholecystitis complicated by pertinent comorbidities including need for anticoagulation.   - Okay to advance diet as tolerated - Continue percutaneous cholecystostomy tube; monitor and record output; flush daily - No emergent surgical intervention. She understands that she will ned to keep drain for a minimum of 6-8 weeks prior to consideration of interval cholecystectomy             - Continue IV Abx (Zosyn); Augmentin for home              - Monitor abdominal examination; on-going bowel function             - Pain control prn; antiemetics prn              - Further management per primary service; we will follow    - Discharge Planning: Doing well s/p percutaneous drain placement. Nothing further from surgical perspective; we will see her in 2-3 weeks as outpatient. I will place follow up, instructions, and leave  hard copy Rx for drain flushes on her chart.    All of the above findings and recommendations were discussed with the patient and her family, and all of their questions were answered to their expressed satisfaction.  -- Lynden Oxford, PA-C [facility] 10/14/2022, 7:47 AM M-F: 7am - 4pm

## 2022-10-14 NOTE — Care Management Important Message (Signed)
Important Message  Patient Details  Name: Vanessa Farmer MRN: [id number] Date of Birth: March 25, 1936   Medicare Important Message Given:  Yes     Johnell Comings 10/14/2022, 1:41 PM

## 2022-10-14 NOTE — TOC Initial Note (Signed)
Transition of Care [facility]) - Initial/Assessment Note    Patient Details  Name: Vanessa Farmer MRN: [id number] Date of Birth: 1935-09-05  Transition of [facility]) CM/SW Contact:    Chapman Fitch, RN Phone Number: 10/14/2022, 5:29 PM  Clinical Narrative:                    Admitted WUJ:[id number] s/p chole tube placement Admitted from: Patient states that she lives at home with her son who has schizophrenia, but he would be available to provide some assistance  PCP: Judithann Graves Current home health/prior home health/DME: [facility]  Therapy recommending SNF.  Patient is agreeable to bed search however she wants me to call her daughter to see what she thinks  Call placed to daughter brenda and granddaughter Wyatt Mage .  They did not answer and their VM are full   PASRR obtained Fl2 sent for signature Bed search initiated       Patient Goals and CMS Choice            Expected Discharge Plan and Services                                              Prior Living Arrangements/Services                       Activities of Daily Living Home Assistive Devices/Equipment: Cane (specify quad or straight) ADL Screening (condition at time of admission) Patient's cognitive ability adequate to safely complete daily activities?: Yes Is the patient deaf or have difficulty hearing?: No Does the patient have difficulty seeing, even when wearing glasses/contacts?: No Does the patient have difficulty concentrating, remembering, or making decisions?: No Patient able to express need for assistance with ADLs?: Yes Does the patient have difficulty dressing or bathing?: No Independently performs ADLs?: Yes (appropriate for developmental age) Does the patient have difficulty walking or climbing stairs?: Yes Weakness of Legs: Both Weakness of Arms/Hands: None  Permission Sought/Granted                  Emotional Assessment              Admission diagnosis:  Acute  cholecystitis [K81.0] Cholecystitis, acute [K81.0] Patient Active Problem List   Diagnosis Date Noted   Cholelithiasis 10/11/2022   Chronic kidney disease, stage 3a (HCC) 10/11/2022   Protein-calorie malnutrition, moderate (HCC) 10/11/2022   Chest pain 10/11/2022   Atrial fibrillation, chronic (HCC) 10/11/2022   Depression 10/11/2022   Acute cholecystitis 10/11/2022   QT prolongation 10/11/2022   History of DVT of lower extremity 04/05/2022   (HFpEF) heart failure with preserved ejection fraction (HCC) 04/05/2022   Atrial fibrillation with RVR (HCC) 04/04/2022   Hypokalemia 12/31/2021   Aortic atherosclerosis (HCC) 10/20/2020   Ganglion cyst of wrist, right 03/20/2019   Thrombocytopenia (HCC) 07/19/2018   Iron deficiency anemia    Polyp of sigmoid colon    Pancytopenia (HCC) 09/30/2017   GERD (gastroesophageal reflux disease) 06/10/2017   CKD (chronic kidney disease) stage 3, GFR 30-59 ml/min (HCC) 06/08/2016   Hyperlipidemia 06/08/2016   Scoliosis 06/07/2016   Osteoporosis 06/07/2016   Essential hypertension 06/07/2016   CAD (coronary artery disease) 06/07/2016   Depression, major, single episode, moderate (HCC) 06/07/2016   PCP:  Reubin Milan, MD Pharmacy:   [id number] [place], [place] -  [address] AT [facility] OF [address] [place] [address] [place] [address] Phone: [phone] Fax: [phone]     Social Determinants of Health (SDOH) Social History: SDOH Screenings   Food Insecurity: No Food Insecurity (10/11/2022)  Housing: Low Risk  (10/11/2022)  Transportation Needs: No Transportation Needs (10/11/2022)  Utilities: Not At Risk (10/11/2022)  Alcohol Screen: Low Risk  (01/24/2021)  Depression (PHQ2-9): Low Risk  (02/22/2022)  Financial Resource Strain: Low Risk  (01/29/2022)  Physical Activity: Inactive (01/29/2022)  Social Connections: Moderately Integrated (01/29/2022)  Stress: No Stress Concern Present (01/29/2022)  Tobacco Use: Low  Risk  (10/12/2022)   SDOH Interventions:     Readmission Risk Interventions     No data to display

## 2022-10-15 ENCOUNTER — Inpatient Hospital Stay: Payer: Medicare Other

## 2022-10-15 DIAGNOSIS — Z86718 Personal history of other venous thrombosis and embolism: Secondary | ICD-10-CM | POA: Diagnosis not present

## 2022-10-15 DIAGNOSIS — Z934 Other artificial openings of gastrointestinal tract status: Secondary | ICD-10-CM | POA: Diagnosis not present

## 2022-10-15 DIAGNOSIS — R1011 Right upper quadrant pain: Secondary | ICD-10-CM | POA: Diagnosis present

## 2022-10-15 DIAGNOSIS — K219 Gastro-esophageal reflux disease without esophagitis: Secondary | ICD-10-CM | POA: Diagnosis not present

## 2022-10-15 DIAGNOSIS — S91002A Unspecified open wound, left ankle, initial encounter: Secondary | ICD-10-CM | POA: Diagnosis not present

## 2022-10-15 DIAGNOSIS — I252 Old myocardial infarction: Secondary | ICD-10-CM | POA: Diagnosis not present

## 2022-10-15 DIAGNOSIS — I517 Cardiomegaly: Secondary | ICD-10-CM | POA: Diagnosis not present

## 2022-10-15 DIAGNOSIS — N2889 Other specified disorders of kidney and ureter: Secondary | ICD-10-CM | POA: Diagnosis not present

## 2022-10-15 DIAGNOSIS — N1831 Chronic kidney disease, stage 3a: Secondary | ICD-10-CM | POA: Diagnosis not present

## 2022-10-15 DIAGNOSIS — R1311 Dysphagia, oral phase: Secondary | ICD-10-CM | POA: Diagnosis not present

## 2022-10-15 DIAGNOSIS — I130 Hypertensive heart and chronic kidney disease with heart failure and stage 1 through stage 4 chronic kidney disease, or unspecified chronic kidney disease: Secondary | ICD-10-CM | POA: Diagnosis not present

## 2022-10-15 DIAGNOSIS — K8020 Calculus of gallbladder without cholecystitis without obstruction: Secondary | ICD-10-CM | POA: Diagnosis not present

## 2022-10-15 DIAGNOSIS — Z7401 Bed confinement status: Secondary | ICD-10-CM | POA: Diagnosis not present

## 2022-10-15 DIAGNOSIS — Z741 Need for assistance with personal care: Secondary | ICD-10-CM | POA: Diagnosis not present

## 2022-10-15 DIAGNOSIS — I4891 Unspecified atrial fibrillation: Secondary | ICD-10-CM | POA: Diagnosis not present

## 2022-10-15 DIAGNOSIS — I2510 Atherosclerotic heart disease of native coronary artery without angina pectoris: Secondary | ICD-10-CM | POA: Diagnosis not present

## 2022-10-15 DIAGNOSIS — Z48815 Encounter for surgical aftercare following surgery on the digestive system: Secondary | ICD-10-CM | POA: Diagnosis not present

## 2022-10-15 DIAGNOSIS — K5900 Constipation, unspecified: Secondary | ICD-10-CM | POA: Diagnosis not present

## 2022-10-15 DIAGNOSIS — R1013 Epigastric pain: Secondary | ICD-10-CM | POA: Diagnosis not present

## 2022-10-15 DIAGNOSIS — Z434 Encounter for attention to other artificial openings of digestive tract: Secondary | ICD-10-CM | POA: Diagnosis not present

## 2022-10-15 DIAGNOSIS — F32A Depression, unspecified: Secondary | ICD-10-CM | POA: Diagnosis not present

## 2022-10-15 DIAGNOSIS — I482 Chronic atrial fibrillation, unspecified: Secondary | ICD-10-CM | POA: Diagnosis not present

## 2022-10-15 DIAGNOSIS — R262 Difficulty in walking, not elsewhere classified: Secondary | ICD-10-CM | POA: Diagnosis not present

## 2022-10-15 DIAGNOSIS — I5030 Unspecified diastolic (congestive) heart failure: Secondary | ICD-10-CM | POA: Diagnosis not present

## 2022-10-15 DIAGNOSIS — R918 Other nonspecific abnormal finding of lung field: Secondary | ICD-10-CM | POA: Diagnosis not present

## 2022-10-15 DIAGNOSIS — R269 Unspecified abnormalities of gait and mobility: Secondary | ICD-10-CM | POA: Diagnosis not present

## 2022-10-15 DIAGNOSIS — I700 Atherosclerosis of aorta: Secondary | ICD-10-CM | POA: Diagnosis not present

## 2022-10-15 DIAGNOSIS — M810 Age-related osteoporosis without current pathological fracture: Secondary | ICD-10-CM | POA: Diagnosis not present

## 2022-10-15 DIAGNOSIS — K449 Diaphragmatic hernia without obstruction or gangrene: Secondary | ICD-10-CM | POA: Diagnosis not present

## 2022-10-15 DIAGNOSIS — K838 Other specified diseases of biliary tract: Secondary | ICD-10-CM | POA: Diagnosis not present

## 2022-10-15 DIAGNOSIS — R110 Nausea: Secondary | ICD-10-CM | POA: Diagnosis not present

## 2022-10-15 DIAGNOSIS — R0902 Hypoxemia: Secondary | ICD-10-CM | POA: Diagnosis not present

## 2022-10-15 DIAGNOSIS — M6281 Muscle weakness (generalized): Secondary | ICD-10-CM | POA: Diagnosis not present

## 2022-10-15 DIAGNOSIS — Z9181 History of falling: Secondary | ICD-10-CM | POA: Diagnosis not present

## 2022-10-15 DIAGNOSIS — D509 Iron deficiency anemia, unspecified: Secondary | ICD-10-CM | POA: Diagnosis not present

## 2022-10-15 DIAGNOSIS — Z01818 Encounter for other preprocedural examination: Secondary | ICD-10-CM | POA: Diagnosis not present

## 2022-10-15 DIAGNOSIS — Z7901 Long term (current) use of anticoagulants: Secondary | ICD-10-CM | POA: Diagnosis not present

## 2022-10-15 DIAGNOSIS — E785 Hyperlipidemia, unspecified: Secondary | ICD-10-CM | POA: Diagnosis not present

## 2022-10-15 DIAGNOSIS — Z9359 Other cystostomy status: Secondary | ICD-10-CM | POA: Diagnosis not present

## 2022-10-15 DIAGNOSIS — K8000 Calculus of gallbladder with acute cholecystitis without obstruction: Secondary | ICD-10-CM | POA: Diagnosis not present

## 2022-10-15 DIAGNOSIS — J90 Pleural effusion, not elsewhere classified: Secondary | ICD-10-CM | POA: Diagnosis not present

## 2022-10-15 DIAGNOSIS — R5381 Other malaise: Secondary | ICD-10-CM | POA: Diagnosis not present

## 2022-10-15 DIAGNOSIS — R1111 Vomiting without nausea: Secondary | ICD-10-CM | POA: Diagnosis not present

## 2022-10-15 DIAGNOSIS — K810 Acute cholecystitis: Secondary | ICD-10-CM | POA: Diagnosis not present

## 2022-10-15 DIAGNOSIS — E440 Moderate protein-calorie malnutrition: Secondary | ICD-10-CM | POA: Diagnosis not present

## 2022-10-15 DIAGNOSIS — I10 Essential (primary) hypertension: Secondary | ICD-10-CM | POA: Diagnosis not present

## 2022-10-15 DIAGNOSIS — K9189 Other postprocedural complications and disorders of digestive system: Secondary | ICD-10-CM | POA: Diagnosis not present

## 2022-10-15 DIAGNOSIS — R112 Nausea with vomiting, unspecified: Secondary | ICD-10-CM | POA: Diagnosis not present

## 2022-10-15 LAB — CULTURE, BLOOD (ROUTINE X 2): Culture: NO GROWTH

## 2022-10-15 MED ORDER — IBUPROFEN 400 MG PO TABS: 400.0000 mg | ORAL_TABLET | Freq: Three times a day (TID) | ORAL | 0 refills | Status: AC | PRN

## 2022-10-15 MED ORDER — DILTIAZEM HCL ER COATED BEADS 240 MG PO CP24: 240.0000 mg | ORAL_CAPSULE | Freq: Every day | ORAL | 2 refills | Status: DC

## 2022-10-15 MED ORDER — LACTULOSE 10 GM/15ML PO SOLN
20.0000 g | Freq: Once | ORAL | Status: AC
  Administered 2022-10-15: 20 g via ORAL
  Filled 2022-10-15: qty 30

## 2022-10-15 MED ORDER — BISACODYL 10 MG RE SUPP: 10.0000 mg | Freq: Every day | RECTAL | Status: DC | PRN

## 2022-10-15 MED ORDER — SERTRALINE HCL 50 MG PO TABS
50.0000 mg | ORAL_TABLET | Freq: Every day | ORAL | Status: DC
  Administered 2022-10-15: 50 mg via ORAL
  Filled 2022-10-15: qty 1

## 2022-10-15 MED ORDER — AMOXICILLIN-POT CLAVULANATE 875-125 MG PO TABS: 1.0000 | ORAL_TABLET | Freq: Two times a day (BID) | ORAL | 0 refills | Status: AC

## 2022-10-15 MED ORDER — SENNOSIDES-DOCUSATE SODIUM 8.6-50 MG PO TABS: 1.0000 | ORAL_TABLET | Freq: Every evening | ORAL | Status: DC | PRN

## 2022-10-15 MED ORDER — POLYETHYLENE GLYCOL 3350 17 G PO PACK: 17.0000 g | PACK | Freq: Every day | ORAL | 0 refills | Status: DC

## 2022-10-15 MED ORDER — APIXABAN 2.5 MG PO TABS: 2.5000 mg | ORAL_TABLET | Freq: Two times a day (BID) | ORAL | Status: DC

## 2022-10-15 MED ORDER — LACTULOSE 10 GM/15ML PO SOLN: 20.0000 g | ORAL | Status: DC

## 2022-10-15 MED ORDER — FLEET ENEMA 7-19 GM/118ML RE ENEM
1.0000 | ENEMA | Freq: Once | RECTAL | Status: AC
  Administered 2022-10-15: 1 via RECTAL

## 2022-10-15 MED ORDER — AMOXICILLIN-POT CLAVULANATE 875-125 MG PO TABS: 1.0000 | ORAL_TABLET | Freq: Two times a day (BID) | ORAL | Status: DC

## 2022-10-15 NOTE — TOC Progression Note (Addendum)
Transition of Care [facility]) - Progression Note    Patient Details  Name: Vanessa Farmer MRN: [id number] Date of Birth: 1936/02/27  Transition of Care [facility]) CM/SW Contact  Liliana Cline, LCSW Phone Number: 10/15/2022, 9:14 AM  Clinical Narrative:    Spoke to patient and granddaughter Stephannie Peters (by phone). Presented bed offers at [facility] and [facility]. They chose [facility] in [place]. Per MD, patient medically stable for DC. CSW called Ricky at [facility] to see if they have a bed today- left VM.  9:54- Per Clide Cliff at [facility], they can take patient today if patient is not on a trach. Confirmed with RN patient is not on a trach. Per Clide Cliff, they need DC Summary before 3pm and patient needs to be in building before 5pm. Updated Care Team.        Expected Discharge Plan and Services                                               Social Determinants of Health (SDOH) Interventions SDOH Screenings   Food Insecurity: No Food Insecurity (10/11/2022)  Housing: Low Risk  (10/11/2022)  Transportation Needs: No Transportation Needs (10/11/2022)  Utilities: Not At Risk (10/11/2022)  Alcohol Screen: Low Risk  (01/24/2021)  Depression (PHQ2-9): Low Risk  (02/22/2022)  Financial Resource Strain: Low Risk  (01/29/2022)  Physical Activity: Inactive (01/29/2022)  Social Connections: Moderately Integrated (01/29/2022)  Stress: No Stress Concern Present (01/29/2022)  Tobacco Use: Low Risk  (10/12/2022)    Readmission Risk Interventions     No data to display

## 2022-10-15 NOTE — Final Progress Note (Signed)
KUB shows mild ileus -patient has been tolerating diet.  I have discontinued narcotics which may be contributing to her ileus and recommend caution as an outpatient for any narcotics.

## 2022-10-15 NOTE — Discharge Summary (Signed)
Physician Discharge Summary   Patient: Vanessa Farmer MRN: [id number] DOB: 05-31-1935  Admit date:     10/11/2022  Discharge date: 10/15/22  Discharge Physician: Delfino Lovett   PCP: Reubin Milan, MD   Recommendations at discharge:   Follow-up with outpatient providers as requested Cholecystostomy tube will need to stay in place 8 weeks OR until interval cholecystectomy. Follow-up with surgery, if deemed a poor surgical candidate at that time, IR can perform cholecystostomy tube exchanges every 8 weeks. If the patient is not a good surgical candidate for cholecystectomy in the future, choledochoscopic-guided percutaneous gallstone retrieval with eventual tube removal is available by IR upon request. Drain catheter has retained suture within catheter, prior to removal of drain cut off the hub to release the retention suture forming the pigtail. Outpatient follow-up order has been placed and our staff will reach out the patient to schedule 8 week cholangiogram and catheter exchange.  Discharge Diagnoses: Principal Problem:   Acute cholecystitis Active Problems:   Cholelithiasis   CAD (coronary artery disease)   History of DVT of lower extremity   (HFpEF) heart failure with preserved ejection fraction (HCC)   Atrial fibrillation, chronic (HCC)   Essential hypertension   Hyperlipidemia   Chronic kidney disease, stage 3a (HCC)   QT prolongation   Depression   Protein-calorie malnutrition, moderate [facility])  Hospital Course: VITA CURRIN is a 87 y.o. female with medical history significant of DVT and A-fib on Eliquis, hypertension, hyperlipidemia, CAD, dCHF, depression, CKD-3A, left bundle blockade, scoliosis, who presents with pain in epigastric area and lower chest  Vital signs showed a heart rate of 111, respirate is 32.  Ultrasound showed gallstone with cholecystitis.  Patient was placed on Zosyn, IR drain performed 6/4.  6/5: Discontinue telemetry, left ankle x-ray negative for acute  pathology, started on full liquid diet, PT and OT eval, transition to Eliquis and discontinue heparin.  Added ibuprofen for ankle pain.  Wound care nurse evaluation for left ankle wound/scab 6/6: Palliative care consult, added Senokot and MiraLAX  Assessment and Plan:  Acute cholecystitis and cholelithiasis:  Status post percutaneous cholecystostomy tube under IR.  She will need to keep drain for minimum 8 weeks prior to consideration for cholecystectomy per surgical team.  Tolerating diet.  Outpatient follow-up with surgery and IR team     CAD (coronary artery disease) History of DVT of lower extremity on Eliquis (HFpEF) heart failure with preserved ejection fraction [facility]):  Atrial fibrillation, chronic (HCC): On Eliquis for anticoagulation and Cardizem for rate control  Essential hypertension Chronic kidney disease, stage 3a (HCC):  Protein-calorie malnutrition, moderate (HCC): continue protein supplement.  Unspecified pain Avoid narcotics if possible      Consultants: Surgery, IR, WOC nurse Procedures performed: S/p percutaneous cholecystostomy drain placement on 10/12/2022 Disposition: Skilled nursing facility with outpatient palliative care to follow Diet recommendation:  Discharge Diet Orders (From admission, onward)     Start     Ordered   10/15/22 0000  Diet - low sodium heart healthy        10/15/22 1108           Carb modified diet DISCHARGE MEDICATION: Allergies as of 10/15/2022   No Known Allergies      Medication List     STOP taking these medications    amLODipine 10 MG tablet Commonly known as: NORVASC       TAKE these medications    amoxicillin-clavulanate 875-125 MG tablet Commonly known as: AUGMENTIN Take 1 tablet  by mouth every 12 (twelve) hours for 10 days.   apixaban 2.5 MG Tabs tablet Commonly known as: ELIQUIS Take 1 tablet (2.5 mg total) by mouth 2 (two) times daily. What changed:  medication strength how much to take additional  instructions   atorvastatin 40 MG tablet Commonly known as: LIPITOR TAKE 1 TABLET(40 MG) BY MOUTH DAILY   diltiazem 240 MG 24 hr capsule Commonly known as: CARDIZEM CD Take 1 capsule (240 mg total) by mouth daily.   Ensure Take 237 mLs by mouth 2 (two) times daily between meals.   furosemide 20 MG tablet Commonly known as: LASIX TAKE 1 TABLET BY MOUTH TWICE A WEEK What changed: See the new instructions.   ibuprofen 400 MG tablet Commonly known as: ADVIL Take 1 tablet (400 mg total) by mouth every 8 (eight) hours as needed for mild pain or moderate pain.   lisinopril 20 MG tablet Commonly known as: ZESTRIL TAKE 1 TABLET(20 MG) BY MOUTH DAILY   ONE-A-DAY WOMENS PO Take 1 tablet by mouth daily.   pantoprazole 40 MG tablet Commonly known as: PROTONIX TAKE 1 TABLET(40 MG) BY MOUTH DAILY   polyethylene glycol 17 g packet Commonly known as: MIRALAX / GLYCOLAX Take 17 g by mouth daily. Start taking on: October 16, 2022   senna-docusate 8.6-50 MG tablet Commonly known as: Senokot-S Take 1 tablet by mouth at bedtime as needed for mild constipation.   sertraline 50 MG tablet Commonly known as: ZOLOFT TAKE 1 TABLET(50 MG) BY MOUTH DAILY   sodium chloride flush 0.9 % Soln Commonly known as: NS Inject 5 mLs into the vein daily. Flush 5 ccs of NS into cholecystostomy tube daily        Contact information for follow-up providers     Piscoya, Elita Quick, MD. Schedule an appointment as soon as possible for a visit in 2 week(s).   Specialty: General Surgery Why: Hospital follow up; cholecystitis with cholecystostomy tube Contact information: [address] [place] [address] [phone]         Pernell Dupre, MD Follow up in 8 week(s).   Specialties: Interventional Radiology, Diagnostic Radiology, Radiology Why: Our staff will call you to setup appointment Contact information: [address] [place] [address] [phone]          Reubin Milan, MD. Schedule an appointment as soon as possible for a visit in 1 week(s).   Specialty: Internal Medicine Why: [facility] Discharge F/UP Contact information: [address] [place] [address] [phone]              Contact information for after-discharge care     Destination     [facility] .   Service: Skilled Nursing Contact information: [address] [place] [address] [phone]                    Discharge Exam: Filed Weights   10/13/22 0448 10/14/22 0500 10/15/22 0358  Weight: 53.7 kg 54.4 kg 56.5 kg   General exam: Appears calm and comfortable  Respiratory system: Clear to auscultation. Respiratory effort normal. Cardiovascular system: Irregular. No JVD, murmurs, rubs, gallops or clicks. No pedal edema. Gastrointestinal system: Abdomen is nondistended, benign, percutaneous cholecystostomy tube in right upper quadrant, serosanguineous output Central nervous system: Alert and oriented x3. No focal neurological deficits. Extremities: Symmetric 5 x 5 power. Skin: Left ankle scab under the foam dressing. Psychiatry: Judgement and insight appear  normal. Mood & affect appropriate.   Condition at discharge: fair  The results of significant diagnostics from this hospitalization (including imaging, microbiology, ancillary and laboratory) are listed below for reference.   Imaging Studies: DG Ankle Complete Left  Result Date: 10/13/2022 CLINICAL DATA:  Acute left ankle pain without known injury. EXAM: LEFT ANKLE COMPLETE - 3+ VIEW COMPARISON:  None Available. FINDINGS: There is no evidence of fracture, dislocation, or joint effusion. There is no evidence of arthropathy or other focal bone abnormality. Soft tissues are unremarkable. IMPRESSION: Negative. Electronically Signed   By: Lupita Raider M.D.   On: 10/13/2022 13:55   IR Perc Cholecystostomy  Result Date:  10/12/2022 INDICATION: 6194 Acute cholecystitis 6194 EXAM: Placement of percutaneous cholecystostomy tube using ultrasound and fluoroscopic guidance MEDICATIONS: Documented in the EMR ANESTHESIA/SEDATION: Moderate (conscious) sedation was employed during this procedure. A total of Versed 0.5 mg and Fentanyl 25 mcg was administered intravenously by the radiology nurse. Total intra-service moderate Sedation Time: 15 minutes. The patient's level of consciousness and vital signs were monitored continuously by radiology nursing throughout the procedure under my direct supervision. FLUOROSCOPY: Radiation Exposure Index (as provided by the fluoroscopic device): 2.0 minutes (4 mGy) COMPLICATIONS: None immediate. PROCEDURE: Informed written consent was obtained from the patient after a thorough discussion of the procedural risks, benefits and alternatives. All questions were addressed. Maximal Sterile Barrier Technique was utilized including caps, mask, sterile gowns, sterile gloves, sterile drape, hand hygiene and skin antiseptic. A timeout was performed prior to the initiation of the procedure. The patient was placed supine on the exam table. The right upper quadrant was prepped and draped in the standard sterile fashion. Ultrasound of the right upper quadrant was performed for planning purposes. This again demonstrated a distended gallbladder with wall edema and sludge, consistent with acute cholecystitis. An infracostal transhepatic approach was planned. Skin entry site was marked, and local analgesia was obtained with 1% lidocaine. Under ultrasound guidance, percutaneous access was obtained into the gallbladder via an infracostal transhepatic approach using a 21 gauge Chiba needle. Access was confirmed with visualization of needle tip within the gallbladder lumen, and free return of bile. An 018 Nitrex wire was then advanced through the access needle and coiled within the gallbladder lumen. A transition dilator was  advanced over this wire, through which an antegrade cholecystogram was performed. Antegrade cholecystogram demonstrated appropriate location in the gallbladder lumen. Over an Amplatz wire, the percutaneous tract was serially dilated followed by placement of a 10 French locking multipurpose drainage catheter into the gallbladder lumen. Locking loop was formed. Additional biliary sludge was drained. Gentle hand injection of contrast material confirmed location of the gallbladder lumen. The drainage catheter was secured to the skin using silk suture and a dressing. It was placed to bag drainage. The patient tolerated the procedure well without immediate complication. IMPRESSION: Successful placement of a 10 French percutaneous cholecystostomy drainage catheter using ultrasound and fluoroscopic guidance. Cholecystostomy tube placed to bag drainage. Electronically Signed   By: Olive Bass M.D.   On: 10/12/2022 15:50   US ABDOMEN LIMITED RUQ (LIVER/GB)  Result Date: 10/11/2022 CLINICAL DATA:  Chest pain.  Abnormal CT. EXAM: ULTRASOUND ABDOMEN LIMITED RIGHT UPPER QUADRANT COMPARISON:  CTA chest from earlier today FINDINGS: Gallbladder: There is diffuse gallbladder wall thickening. Gallbladder wall thickness measures up to 6.8 mm. Nonmobile stones are identified. The largest measures 6.3 mm. Pericholecystic fluid. Positive sonographic Murphy's sign. Common bile duct: Diameter: 4.2 mm Liver: Increased parenchymal echogenicity. No focal abnormality. Portal  vein is patent on color Doppler imaging with normal direction of blood flow towards the liver. Other: None. IMPRESSION: Gallstones, gallbladder wall thickening, pericholecystic fluid and positive sonographic Murphy's sign. Imaging findings are compatible with acute cholecystitis. Electronically Signed   By: Signa Kell M.D.   On: 10/11/2022 07:03   CT Angio Chest PE W and/or Wo Contrast  Result Date: 10/11/2022 CLINICAL DATA:  Pulmonary embolism suspected. High  probability. Sudden onset chest pain or home. History of left bundle branch block. EXAM: CT ANGIOGRAPHY CHEST WITH CONTRAST TECHNIQUE: Multidetector CT imaging of the chest was performed using the standard protocol during bolus administration of intravenous contrast. Multiplanar CT image reconstructions and MIPs were obtained to evaluate the vascular anatomy. RADIATION DOSE REDUCTION: This exam was performed according to the departmental dose-optimization program which includes automated exposure control, adjustment of the mA and/or kV according to patient size and/or use of iterative reconstruction technique. CONTRAST:  75mL OMNIPAQUE IOHEXOL 350 MG/ML SOLN COMPARISON:  Portable chest today, PA and lateral chest 04/04/2022, CTA chest 04/04/2022 and CTA chest 12/31/2021. FINDINGS: Cardiovascular: There is stable cardiomegaly with left ventricular and septal wall hypertrophy, scattered calcification in the mitral ring and patchy three-vessel coronary artery calcifications. No pericardial effusion is seen. The pulmonary veins are prominent, more so than previously. The pulmonary arteries are normal in caliber and do not show embolic filling defects. The aorta is tortuous and heavily calcified but normal caliber. No aneurysm, stenosis or dissection are seen. There are patchy calcifications in the great vessels. There is a small amount of air in the right subclavian vein continuing up the IJ vein, most likely either injected contrast injection or with insertion of the patient's IV. There is no air in the right heart and main pulmonary arteries. Mediastinum/Nodes: Large hiatal hernia with approximately 2/3 the stomach intrathoracic and inverted, with fluid level. Thoracic esophagus is unremarkable. No thyroid nodule, axillary or intrathoracic adenopathy are seen. Lungs/Pleura: There are chronic biapical pleural-parenchymal scarring changes and calcifications. There are bilateral trace pleural effusions, decreased since  the prior CTA. There is a calcified granuloma in the right middle lobe. No subpleural edema is seen. Chronic posterior basal left lower lobe atelectatic foci along side the hiatal hernia and scattered linear scar-like opacities in the bases. No focal pneumonia or pulmonary mass are seen. Upper Abdomen: There is gallbladder dilatation, wall thickening and scattered stones. Findings concerning for acute cholecystitis. Further evaluation recommended. The abdominal aorta is heavily calcified. Musculoskeletal: There is dextroscoliosis, osteopenia and degenerative change of the spine. No acute osseous findings. No aggressive lesions. Review of the MIP images confirms the above findings. IMPRESSION: 1. No evidence of arterial dilatation or embolus. 2. Cardiomegaly with left ventricular and septal wall hypertrophy and prominent pulmonary veins, but no overt edema. 3. Trace pleural effusions, decreased since the last CTA. 4. Coronary and heavy aortic atherosclerosis. 5. Large hiatal hernia. 6. Cholelithiasis and findings worrisome for acute cholecystitis. Further evaluation recommended. 7. Osteopenia, scoliosis and degenerative change. 8. Small amount of air in the right subclavian vein continuing up the IJ vein, most likely either injected with contrast injection or with insertion of the patient's IV. There is no air in the right heart and main pulmonary arteries. Aortic Atherosclerosis (ICD10-I70.0). Electronically Signed   By: Almira Bar M.D.   On: 10/11/2022 05:49   DG Chest 1 View  Result Date: 10/11/2022 CLINICAL DATA:  Shortness of breath and chest pain. EXAM: CHEST  1 VIEW COMPARISON:  Radiograph and CT 04/04/2022 FINDINGS:  Normal heart size. Stable mediastinal contours. Dense aortic atherosclerosis. Retrocardiac hiatal hernia. Mild chronic interstitial coarsening. No focal airspace disease. No pulmonary edema or pleural effusion. No pneumothorax. Scoliotic curvature. IMPRESSION: No acute abnormality.  Electronically Signed   By: Narda Rutherford M.D.   On: 10/11/2022 02:49    Microbiology: Results for orders placed or performed during the hospital encounter of 10/11/22  Culture, blood (Routine X 2) w Reflex to ID Panel     Status: None (Preliminary result)   Collection Time: 10/11/22  8:23 AM   Specimen: BLOOD  Result Value Ref Range Status   Specimen Description BLOOD RIGHT [facility]  Final   Special Requests   Final    BOTTLES DRAWN AEROBIC AND ANAEROBIC Blood Culture results may not be optimal due to an excessive volume of blood received in culture bottles   Culture   Final    NO GROWTH 4 DAYS Performed at [facility], [address] [place], [place] [address]    Report Status PENDING  Incomplete  Culture, blood (Routine X 2) w Reflex to ID Panel     Status: None (Preliminary result)   Collection Time: 10/11/22  8:23 AM   Specimen: BLOOD  Result Value Ref Range Status   Specimen Description BLOOD LEFT FA  Final   Special Requests   Final    BOTTLES DRAWN AEROBIC AND ANAEROBIC Blood Culture adequate volume   Culture   Final    NO GROWTH 4 DAYS Performed at [facility], [address] [place], [place] [address]    Report Status PENDING  Incomplete    Labs: CBC: Recent Labs  Lab 10/11/22 0228 10/12/22 0242 10/13/22 0342 10/14/22 0441  WBC 9.0 7.2 7.9 5.2  HGB 12.2 11.1* 11.2* 10.1*  HCT 37.5 33.9* 33.8* 30.1*  MCV 101.1* 101.2* 99.7 99.0  PLT 202 162 160 144*   Basic Metabolic Panel: Recent Labs  Lab 10/11/22 0228 10/11/22 0412 10/12/22 0242 10/13/22 0342 10/14/22 0441  NA 137  --  134* 133* 134*  K 3.5  --  4.2 3.6 3.6  CL 101  --  100 100 102  CO2 26  --  23 25 25   GLUCOSE 170*  --  119* 101* 107*  BUN 20  --  13 15 16   CREATININE 0.99  --  0.99 0.92 1.03*  CALCIUM 8.8*  --  8.4* 8.1* 8.0*  MG  --  2.0  --  1.9  --   PHOS  --   --   --  3.7  --    Liver Function Tests: Recent Labs  Lab 10/11/22 0413 10/13/22 0342  AST 25 107*   ALT 19 188*  ALKPHOS 80 76  BILITOT 0.6 1.3*  PROT 7.1 5.6*  ALBUMIN 3.8 2.7*   CBG: No results for input(s): "GLUCAP" in the last 168 hours.  Discharge time spent: greater than 30 minutes.  Signed: Delfino Lovett, MD [facility] 10/15/2022

## 2022-10-15 NOTE — Progress Notes (Signed)
Dr Sherryll Burger notified that fleet enema and 2 large tap water enema were given I only got a few pieces of stool back (about 8 and each the size of a pea). Order being placed for x-ray and lactulose

## 2022-10-15 NOTE — TOC Transition Note (Addendum)
Transition of Care [facility]) - CM/SW Discharge Note   Patient Details  Name: Vanessa Farmer MRN: [id number] Date of Birth: 10/20/1935  [facility]) CM/SW Contact:  Vanessa Cline, Vanessa Farmer Phone Number: 10/15/2022, 11:40 AM   Clinical Narrative:    Discharge to [facility] today. Room E5. Confirmed with Admissions Worker Vanessa Farmer.  Updated MD, RN. Attempted calls to daughter Vanessa Farmer) and granddaughter Vanessa Peters)- no answer and VM full. Did speak with Vanessa Farmer this morning who chose [facility].  Asked RN to call report. EMS paperwork completed. RN to call [facility] after patient has a bowel movement. Vanessa Farmer states patient must be in building before 5pm, preferably before 3pm per [place].   3:15- Notified Vanessa Farmer at [facility] that per RN, patient was just picked up from [facility].     Final next level of care: Skilled Nursing Facility Barriers to Discharge: Barriers Resolved   Patient Goals and CMS Choice CMS Medicare.gov Compare Post Acute Care list provided to:: Patient Choice offered to / list presented to : Patient  Discharge Placement                Patient chooses bed at: Other - please specify in the comment section below: ([facility] in [place]) Patient to be transferred to facility by: [facility] Name of family member notified: Vanessa Farmer Patient and family notified of of transfer: 10/15/22  Discharge Plan and Services Additional resources added to the After Visit Summary for                                       Social Determinants of Health (SDOH) Interventions SDOH Screenings   Food Insecurity: No Food Insecurity (10/11/2022)  Housing: Low Risk  (10/11/2022)  Transportation Needs: No Transportation Needs (10/11/2022)  Utilities: Not At Risk (10/11/2022)  Alcohol Screen: Low Risk  (01/24/2021)  Depression (PHQ2-9): Low Risk  (02/22/2022)  Financial Resource Strain: Low Risk  (01/29/2022)  Physical Activity: Inactive (01/29/2022)  Social Connections: Moderately Integrated (01/29/2022)   Stress: No Stress Concern Present (01/29/2022)  Tobacco Use: Low Risk  (10/12/2022)     Readmission Risk Interventions     No data to display

## 2022-10-16 LAB — CULTURE, BLOOD (ROUTINE X 2): Culture: NO GROWTH

## 2022-10-19 ENCOUNTER — Inpatient Hospital Stay: Payer: Medicare Other | Admitting: Internal Medicine

## 2022-10-19 DIAGNOSIS — I482 Chronic atrial fibrillation, unspecified: Secondary | ICD-10-CM | POA: Diagnosis not present

## 2022-10-19 DIAGNOSIS — K810 Acute cholecystitis: Secondary | ICD-10-CM | POA: Diagnosis not present

## 2022-10-19 DIAGNOSIS — I2510 Atherosclerotic heart disease of native coronary artery without angina pectoris: Secondary | ICD-10-CM | POA: Diagnosis not present

## 2022-10-19 DIAGNOSIS — S91002A Unspecified open wound, left ankle, initial encounter: Secondary | ICD-10-CM | POA: Diagnosis not present

## 2022-10-19 DIAGNOSIS — R5381 Other malaise: Secondary | ICD-10-CM | POA: Diagnosis not present

## 2022-10-19 DIAGNOSIS — E785 Hyperlipidemia, unspecified: Secondary | ICD-10-CM | POA: Diagnosis not present

## 2022-10-19 DIAGNOSIS — F32A Depression, unspecified: Secondary | ICD-10-CM | POA: Diagnosis not present

## 2022-10-19 DIAGNOSIS — I5030 Unspecified diastolic (congestive) heart failure: Secondary | ICD-10-CM | POA: Diagnosis not present

## 2022-10-19 DIAGNOSIS — I130 Hypertensive heart and chronic kidney disease with heart failure and stage 1 through stage 4 chronic kidney disease, or unspecified chronic kidney disease: Secondary | ICD-10-CM | POA: Diagnosis not present

## 2022-10-19 DIAGNOSIS — R112 Nausea with vomiting, unspecified: Secondary | ICD-10-CM | POA: Diagnosis not present

## 2022-10-19 DIAGNOSIS — I252 Old myocardial infarction: Secondary | ICD-10-CM | POA: Diagnosis not present

## 2022-10-19 DIAGNOSIS — I700 Atherosclerosis of aorta: Secondary | ICD-10-CM | POA: Diagnosis not present

## 2022-10-19 DIAGNOSIS — K219 Gastro-esophageal reflux disease without esophagitis: Secondary | ICD-10-CM | POA: Diagnosis not present

## 2022-10-19 DIAGNOSIS — E440 Moderate protein-calorie malnutrition: Secondary | ICD-10-CM | POA: Diagnosis not present

## 2022-10-19 DIAGNOSIS — D509 Iron deficiency anemia, unspecified: Secondary | ICD-10-CM | POA: Diagnosis not present

## 2022-10-19 DIAGNOSIS — K5900 Constipation, unspecified: Secondary | ICD-10-CM | POA: Diagnosis not present

## 2022-10-19 DIAGNOSIS — R1013 Epigastric pain: Secondary | ICD-10-CM | POA: Diagnosis not present

## 2022-10-19 DIAGNOSIS — Z86718 Personal history of other venous thrombosis and embolism: Secondary | ICD-10-CM | POA: Diagnosis not present

## 2022-10-19 DIAGNOSIS — N1831 Chronic kidney disease, stage 3a: Secondary | ICD-10-CM | POA: Diagnosis not present

## 2022-10-19 DIAGNOSIS — Z7901 Long term (current) use of anticoagulants: Secondary | ICD-10-CM | POA: Diagnosis not present

## 2022-10-19 DIAGNOSIS — K8020 Calculus of gallbladder without cholecystitis without obstruction: Secondary | ICD-10-CM | POA: Diagnosis not present

## 2022-10-19 DIAGNOSIS — M810 Age-related osteoporosis without current pathological fracture: Secondary | ICD-10-CM | POA: Diagnosis not present

## 2022-10-21 DIAGNOSIS — R1013 Epigastric pain: Secondary | ICD-10-CM | POA: Diagnosis not present

## 2022-10-27 ENCOUNTER — Ambulatory Visit (INDEPENDENT_AMBULATORY_CARE_PROVIDER_SITE_OTHER): Payer: Medicare Other | Admitting: Surgery

## 2022-10-27 ENCOUNTER — Encounter: Payer: Self-pay | Admitting: Surgery

## 2022-10-27 ENCOUNTER — Other Ambulatory Visit: Payer: Self-pay | Admitting: Radiology

## 2022-10-27 VITALS — BP 128/74 | HR 70 | Temp 98.0°F | Ht 60.0 in | Wt 120.0 lb

## 2022-10-27 DIAGNOSIS — K810 Acute cholecystitis: Secondary | ICD-10-CM

## 2022-10-27 NOTE — Progress Notes (Signed)
10/27/2022  History of Present Illness: JAMAURIA CHAPPELL is a 87 y.o. female presenting for follow up of acute cholecystitis.  She was admitted on 10/11/22 with acute cholecystitis, with ultrasound showing significant wall thickening, and pericholecystic fluid.  She is on Eliquis for history of atrial fibrillation and DVT, and as such, a percutaneous cholecystostomy drain was placed instead.  She was discharged to SNF on 10/15/22.  Patient reports having some soreness/tenderness in RUQ area at the drain site, and has been having some nausea which is controlled with medication. Her appetite is not great yet.  She's working with PT at [facility], but she is significantly weakened/deconditioned.    Past Medical History: Past Medical History:  Diagnosis Date   Anemia    Cataract    Depression    Fall    GERD (gastroesophageal reflux disease)    Hyperlipidemia    Hypertension    Myocardial infarction [facility])    "mild" many yrs ago     Past Surgical History: Past Surgical History:  Procedure Laterality Date   ABDOMINAL HYSTERECTOMY     CATARACT EXTRACTION, BILATERAL  2021   COLONOSCOPY WITH PROPOFOL N/A 12/05/2017   Procedure: COLONOSCOPY WITH PROPOFOL;  Surgeon: Midge Minium, MD;  Location: [facility];  Service: Endoscopy;  Laterality: N/A;   ESOPHAGOGASTRODUODENOSCOPY (EGD) WITH PROPOFOL N/A 12/05/2017   Procedure: ESOPHAGOGASTRODUODENOSCOPY (EGD) WITH PROPOFOL;  Surgeon: Midge Minium, MD;  Location: [facility];  Service: Endoscopy;  Laterality: N/A;   IR PERC CHOLECYSTOSTOMY  10/12/2022   POLYPECTOMY  12/05/2017   Procedure: POLYPECTOMY;  Surgeon: Midge Minium, MD;  Location: [facility];  Service: Endoscopy;;   TONSILLECTOMY      Home Medications: Prior to Admission medications   Medication Sig Start Date End Date Taking? Authorizing Provider  apixaban (ELIQUIS) 2.5 MG TABS tablet Take 1 tablet (2.5 mg total) by mouth 2 (two) times daily. 10/15/22  Yes Delfino Lovett, MD   atorvastatin (LIPITOR) 40 MG tablet TAKE 1 TABLET(40 MG) BY MOUTH DAILY 07/30/22  Yes Reubin Milan, MD  diltiazem (CARDIZEM CD) 240 MG 24 hr capsule Take 1 capsule (240 mg total) by mouth daily. 10/15/22  Yes Delfino Lovett, MD  furosemide (LASIX) 20 MG tablet TAKE 1 TABLET BY MOUTH TWICE A WEEK Patient taking differently: Take 20 mg by mouth daily. 09/21/22  Yes Reubin Milan, MD  ibuprofen (ADVIL) 400 MG tablet Take 1 tablet (400 mg total) by mouth every 8 (eight) hours as needed for mild pain or moderate pain. 10/15/22 11/14/22 Yes Delfino Lovett, MD  lisinopril (ZESTRIL) 20 MG tablet TAKE 1 TABLET(20 MG) BY MOUTH DAILY 04/06/22  Yes Reubin Milan, MD  Multiple Vitamins-Calcium (ONE-A-DAY WOMENS PO) Take 1 tablet by mouth daily.   Yes [provider]  pantoprazole (PROTONIX) 40 MG tablet TAKE 1 TABLET(40 MG) BY MOUTH DAILY 03/08/22  Yes Reubin Milan, MD  polyethylene glycol (MIRALAX / GLYCOLAX) 17 g packet Take 17 g by mouth daily. 10/16/22  Yes Delfino Lovett, MD  promethazine (PHENERGAN) 12.5 MG tablet Take by mouth. 10/22/22  Yes [provider]  senna-docusate (SENOKOT-S) 8.6-50 MG tablet Take 1 tablet by mouth at bedtime as needed for mild constipation. 10/15/22  Yes Delfino Lovett, MD  sertraline (ZOLOFT) 50 MG tablet TAKE 1 TABLET(50 MG) BY MOUTH DAILY 07/30/22  Yes Reubin Milan, MD  sodium chloride flush (NS) 0.9 % SOLN Inject 5 mLs into the vein daily. Flush 5 ccs of NS into cholecystostomy tube  daily 10/14/22 12/13/22 Yes Donovan Kail, PA-C  traMADol (ULTRAM) 50 MG tablet Take 50 mg by mouth 2 (two) times daily.   Yes [provider]  Wound Dressings (MEDIHONEY WOUND/BURN DRESSING) GEL Apply topically. 10/19/22  Yes [provider]  ENSURE (ENSURE) Take 237 mLs by mouth 2 (two) times daily between meals. 10/20/17 10/17/20  Reubin Milan, MD    Allergies: No Known Allergies  Review of Systems: Review of Systems  Constitutional:  Negative for  chills and fever.  Respiratory:  Negative for shortness of breath.   Cardiovascular:  Negative for chest pain.  Gastrointestinal:  Positive for abdominal pain and nausea. Negative for vomiting.  Genitourinary:  Negative for dysuria.    Physical Exam BP 128/74   Pulse 70   Temp 98 F (36.7 C)   Ht 5' (1.524 m)   Wt 120 lb (54.4 kg)   SpO2 92%   BMI 23.44 kg/m  CONSTITUTIONAL: No acute distress, frail appearing. HEENT:  Normocephalic, atraumatic, extraocular motion intact. RESPIRATORY:  Lungs are clear, and breath sounds are equal bilaterally. Normal respiratory effort without pathologic use of accessory muscles. CARDIOVASCULAR: Heart is regular without murmurs, gallops, or rubs. GI: The abdomen is soft, non-distended, with localized soreness in RUQ.  Drain is in place, well secured, with serosanguinous fluid in bag. NEUROLOGIC:  Motor and sensation is grossly normal.  Cranial nerves are grossly intact. PSYCH:  Alert and oriented to person, place and time. Affect is normal.  Labs/Imaging: CTA chest on 10/11/22: IMPRESSION: 1. No evidence of arterial dilatation or embolus. 2. Cardiomegaly with left ventricular and septal wall hypertrophy and prominent pulmonary veins, but no overt edema. 3. Trace pleural effusions, decreased since the last CTA. 4. Coronary and heavy aortic atherosclerosis. 5. Large hiatal hernia. 6. Cholelithiasis and findings worrisome for acute cholecystitis. Further evaluation recommended. 7. Osteopenia, scoliosis and degenerative change. 8. Small amount of air in the right subclavian vein continuing up the IJ vein, most likely either injected with contrast injection or with insertion of the patient's IV. There is no air in the right heart and main pulmonary arteries.  Ultrasound RUQ on 10/11/22: IMPRESSION: Gallstones, gallbladder wall thickening, pericholecystic fluid and positive sonographic Murphy's sign. Imaging findings are compatible with acute  cholecystitis.  Assessment and Plan: This is a 87 y.o. female with history of acute cholecystitis, status post percutaneous cholecystostomy drain placement.  - Patient is now 2 weeks out from her drain placement and is doing better than during her hospital stay.  Her pain has been improving and nausea is controlled.  The drain is getting flush daily as instructed and has been working well.  I do agree with the patient and she is very frail appearing at this point and although she is working with physical therapy definitely has become deconditioned from her infection and hospital stay.  Discussed with patient that for now the neck step would be to obtain a cholangiogram through the drain to evaluate her gallbladder which would be in about 4 to 6 weeks.  She will follow-up with me after that to reassess how she is doing.  Discussed with her that we would hold off on any surgery until she is stronger so that she can tolerate the surgery better.  Discussed with her that there is a potential chance that we do not do surgery and leave the drain in place but this would depend on how her clinical progress continues.  In the meantime, we will send for medical  and cardiology clearance as a precaution she would need to be off Eliquis if we ever do surgery for her. - Follow-up with me towards the end of July.  I spent 30 minutes dedicated to the care of this patient on the date of this encounter to include pre-visit review of records, face-to-face time with the patient discussing diagnosis and management, and any post-visit coordination of care.   Howie Ill, MD [facility]

## 2022-10-27 NOTE — Patient Instructions (Addendum)
You need to have a cholangiogram done to check your drain tube in July. Continue to have your drain flushed everyday.  This is scheduled at [facility] on 11/23/22. You will need to arrive there at 12:30 pm. The address is [address], [address], [place] [address].  If you need to reschedule their number is [phone].  Call to schedule a Cardiology visit. You will need to be seen by Cardiology prior to any surgery.   We will have you follow up here after we get the results of this scan.  See your appointment below.

## 2022-11-04 ENCOUNTER — Telehealth: Payer: Self-pay

## 2022-11-04 NOTE — Progress Notes (Signed)
Medical Clearance has been received from [facility], Dr Keane Police. The patient is cleared at Low risk for surgery.

## 2022-11-04 NOTE — Telephone Encounter (Signed)
Spoke with the patient's daughter and gave her the number for [facility]. She will call them to get the patient set up for an appointment. She will call us with any further questions.

## 2022-11-12 ENCOUNTER — Emergency Department: Payer: Medicare Other

## 2022-11-12 ENCOUNTER — Other Ambulatory Visit: Payer: Self-pay

## 2022-11-12 ENCOUNTER — Emergency Department
Admission: EM | Admit: 2022-11-12 | Discharge: 2022-11-12 | Disposition: A | Discharge status: Home / Self Care | Payer: Medicare Other | Attending: Student in an Organized Health Care Education/Training Program | Admitting: Student in an Organized Health Care Education/Training Program

## 2022-11-12 DIAGNOSIS — I517 Cardiomegaly: Secondary | ICD-10-CM | POA: Diagnosis not present

## 2022-11-12 DIAGNOSIS — R0902 Hypoxemia: Secondary | ICD-10-CM | POA: Diagnosis not present

## 2022-11-12 DIAGNOSIS — R1011 Right upper quadrant pain: Secondary | ICD-10-CM | POA: Diagnosis not present

## 2022-11-12 DIAGNOSIS — K449 Diaphragmatic hernia without obstruction or gangrene: Secondary | ICD-10-CM | POA: Diagnosis not present

## 2022-11-12 DIAGNOSIS — Z434 Encounter for attention to other artificial openings of digestive tract: Secondary | ICD-10-CM

## 2022-11-12 DIAGNOSIS — R1111 Vomiting without nausea: Secondary | ICD-10-CM | POA: Diagnosis not present

## 2022-11-12 DIAGNOSIS — K838 Other specified diseases of biliary tract: Secondary | ICD-10-CM | POA: Diagnosis not present

## 2022-11-12 DIAGNOSIS — I10 Essential (primary) hypertension: Secondary | ICD-10-CM | POA: Diagnosis not present

## 2022-11-12 DIAGNOSIS — Z7401 Bed confinement status: Secondary | ICD-10-CM | POA: Diagnosis not present

## 2022-11-12 DIAGNOSIS — N1831 Chronic kidney disease, stage 3a: Secondary | ICD-10-CM | POA: Diagnosis not present

## 2022-11-12 DIAGNOSIS — N2889 Other specified disorders of kidney and ureter: Secondary | ICD-10-CM | POA: Diagnosis not present

## 2022-11-12 DIAGNOSIS — R112 Nausea with vomiting, unspecified: Secondary | ICD-10-CM | POA: Diagnosis not present

## 2022-11-12 DIAGNOSIS — K9189 Other postprocedural complications and disorders of digestive system: Secondary | ICD-10-CM | POA: Diagnosis not present

## 2022-11-12 DIAGNOSIS — Z9359 Other cystostomy status: Secondary | ICD-10-CM | POA: Diagnosis not present

## 2022-11-12 DIAGNOSIS — J90 Pleural effusion, not elsewhere classified: Secondary | ICD-10-CM | POA: Diagnosis not present

## 2022-11-12 DIAGNOSIS — R918 Other nonspecific abnormal finding of lung field: Secondary | ICD-10-CM | POA: Diagnosis not present

## 2022-11-12 DIAGNOSIS — R110 Nausea: Secondary | ICD-10-CM | POA: Diagnosis not present

## 2022-11-12 LAB — CBC WITH DIFFERENTIAL/PLATELET
Abs Immature Granulocytes: 0.02 10*3/uL (ref 0.00–0.07)
Basophils Absolute: 0 10*3/uL (ref 0.0–0.1)
Basophils Relative: 0 %
Eosinophils Absolute: 0.2 10*3/uL (ref 0.0–0.5)
Eosinophils Relative: 4 %
HCT: 36.6 % (ref 36.0–46.0)
Hemoglobin: 12.1 g/dL (ref 12.0–15.0)
Immature Granulocytes: 0 %
Lymphocytes Relative: 27 %
Lymphs Abs: 1.4 10*3/uL (ref 0.7–4.0)
MCH: 31.8 pg (ref 26.0–34.0)
MCHC: 33.1 g/dL (ref 30.0–36.0)
MCV: 96.1 fL (ref 80.0–100.0)
Monocytes Absolute: 0.4 10*3/uL (ref 0.1–1.0)
Monocytes Relative: 8 %
Neutro Abs: 3.2 10*3/uL (ref 1.7–7.7)
Neutrophils Relative %: 61 %
Platelets: 218 10*3/uL (ref 150–400)
RBC: 3.81 MIL/uL — ABNORMAL LOW (ref 3.87–5.11)
RDW: 12.6 % (ref 11.5–15.5)
WBC: 5.2 10*3/uL (ref 4.0–10.5)
nRBC: 0 % (ref 0.0–0.2)

## 2022-11-12 LAB — COMPREHENSIVE METABOLIC PANEL
ALT: 18 U/L (ref 0–44)
AST: 20 U/L (ref 15–41)
Albumin: 3.5 g/dL (ref 3.5–5.0)
Alkaline Phosphatase: 79 U/L (ref 38–126)
Anion gap: 9 (ref 5–15)
BUN: 28 mg/dL — ABNORMAL HIGH (ref 8–23)
CO2: 26 mmol/L (ref 22–32)
Calcium: 9.5 mg/dL (ref 8.9–10.3)
Chloride: 96 mmol/L — ABNORMAL LOW (ref 98–111)
Creatinine, Ser: 0.97 mg/dL (ref 0.44–1.00)
GFR, Estimated: 57 mL/min — ABNORMAL LOW (ref >60)
Glucose, Bld: 110 mg/dL — ABNORMAL HIGH (ref 70–99)
Potassium: 4.3 mmol/L (ref 3.5–5.1)
Sodium: 131 mmol/L — ABNORMAL LOW (ref 135–145)
Total Bilirubin: 0.5 mg/dL (ref 0.3–1.2)
Total Protein: 7.2 g/dL (ref 6.5–8.1)

## 2022-11-12 LAB — LIPASE, BLOOD: Lipase: 44 U/L (ref 11–51)

## 2022-11-12 MED ORDER — ACETAMINOPHEN 325 MG PO TABS
650.0000 mg | ORAL_TABLET | Freq: Once | ORAL | Status: AC
  Administered 2022-11-12: 650 mg via ORAL
  Filled 2022-11-12: qty 2

## 2022-11-12 MED ORDER — OXYCODONE-ACETAMINOPHEN 5-325 MG PO TABS: 1.0000 | ORAL_TABLET | ORAL | 0 refills | Status: DC | PRN

## 2022-11-12 MED ORDER — MORPHINE SULFATE (PF) 4 MG/ML IV SOLN
4.0000 mg | INTRAVENOUS | Status: DC | PRN
  Administered 2022-11-12: 4 mg via INTRAVENOUS
  Filled 2022-11-12: qty 1

## 2022-11-12 MED ORDER — IOHEXOL 300 MG/ML  SOLN
80.0000 mL | Freq: Once | INTRAMUSCULAR | Status: AC | PRN
  Administered 2022-11-12: 80 mL via INTRAVENOUS

## 2022-11-12 NOTE — ED Notes (Signed)
Attempted to call report to [facility] and Rehab. No answer

## 2022-11-12 NOTE — ED Notes (Signed)
[facility] called for transport to [facility]

## 2022-11-12 NOTE — ED Triage Notes (Signed)
Patient presents with a percutaneous cholecystostomy drain that was placed 6/3 for acute cholecystitis (patient was not a candidate for surgery at the time); She is here today for pain at the incision site; She reports that fluid has stopped draining today and staff at [facility] were unable to flush the drain as well

## 2022-11-12 NOTE — ED Provider Notes (Signed)
[facility] Provider Note    Event Date/Time   First MD Initiated Contact with Patient 11/12/22 1607     (approximate)   History   Abdominal Pain (Patient presents with a percutaneous cholecystostomy drain that was placed 6/3 for acute cholecystitis (patient was not a candidate for surgery at the time); She is here today for pain at the incision site; She reports that fluid has stopped draining today and staff at [facility] were unable to flush the drain as well)   HPI  Vanessa Farmer is a 87 y.o. female status post recent Coley cystitis status post percutaneous cholecystostomy tube coming from [facility] due to pain in the right upper quadrant burning in nature and unable to flush or drain the tube.  Patient denies any fevers no nausea or vomiting.     Physical Exam   Triage Vital Signs: ED Triage Vitals [11/12/22 1526]  Enc Vitals Group     BP (!) 159/84     Pulse Rate 82     Resp 18     Temp 98.2 F (36.8 C)     Temp Source Oral     SpO2 95 %     Weight 120 lb (54.4 kg)     Height 5' (1.524 m)     Head Circumference      Peak Flow      Pain Score 10     Pain Loc      Pain Edu?      Excl. in GC?     Most recent vital signs: Vitals:   11/12/22 1700 11/12/22 1800  BP: (!) 138/59 (!) 153/73  Pulse: (!) 58 67  Resp:    Temp:    SpO2: 95% 91%     Constitutional: Alert  Eyes: Conjunctivae are normal.  Head: Atraumatic. Nose: No congestion/rhinnorhea. Mouth/Throat: Mucous membranes are moist.   Neck: Painless ROM.  Cardiovascular:   Good peripheral circulation. Respiratory: Normal respiratory effort.  No retractions.  Gastrointestinal: Soft and nontender.  Insertion site of drain appears clean dry and intact. Musculoskeletal:  no deformity Neurologic:  MAE spontaneously. No gross focal neurologic deficits are appreciated.  Skin:  Skin is warm, dry and intact. No rash noted. Psychiatric: Mood and affect are normal.  Speech and behavior are normal.    ED Results / Procedures / Treatments   Labs (all labs ordered are listed, but only abnormal results are displayed) Labs Reviewed  CBC WITH DIFFERENTIAL/PLATELET - Abnormal; Notable for the following components:      Result Value   RBC 3.81 (*)    All other components within normal limits  COMPREHENSIVE METABOLIC PANEL - Abnormal; Notable for the following components:   Sodium 131 (*)    Chloride 96 (*)    Glucose, Bld 110 (*)    BUN 28 (*)    GFR, Estimated 57 (*)    All other components within normal limits  LIPASE, BLOOD     EKG     RADIOLOGY Please see ED Course for my review and interpretation.  I personally reviewed all radiographic images ordered to evaluate for the above acute complaints and reviewed radiology reports and findings.  These findings were personally discussed with the patient.  Please see medical record for radiology report.    PROCEDURES:  Critical Care performed: No  Procedures   MEDICATIONS ORDERED IN ED: Medications  morphine (PF) 4 MG/ML injection 4 mg (4 mg Intravenous Given 11/12/22 1858)  iohexol (OMNIPAQUE) 300 MG/ML solution 80 mL (80 mLs Intravenous Contrast Given 11/12/22 1832)  acetaminophen (TYLENOL) tablet 650 mg (650 mg Oral Given 11/12/22 1935)     IMPRESSION / MDM / ASSESSMENT AND PLAN / ED COURSE  I reviewed the triage vital signs and the nursing notes.                              Differential diagnosis includes, but is not limited to, cholecystitis, cholelithiasis, postop infection, abscess, displacement of cholecystostomy tube, shingles, musculoskeletal strain  Patient presenting to the ER for evaluation of symptoms as described above.  Based on symptoms, risk factors and considered above differential, this presenting complaint could reflect a potentially life-threatening illness therefore the patient will be placed on continuous pulse oximetry and telemetry for monitoring.  Laboratory  evaluation will be sent to evaluate for the above complaints.  Will give pain medication.  Will order imaging.    Clinical Course as of 11/12/22 2019  Fri Nov 12, 2022  1757 Case was discussed in consultation with IR on-call who does recommend CT imaging of the abdomen pelvis with IV contrast to evaluate for any post cholecystostomy complications.  She remains hemodynamically stable. [PR]  1934 Patient with some persistent pain at the insertion site but there is no sign of overlying cellulitis CT imaging is reassuring.  Will give Tylenol will p.o. challenge. [PR]    Clinical Course User Index [PR] Willy Eddy, MD   Patient tolerating p.o. challenge.  Symptoms improved with Tylenol.  Is requesting something additional for pain to go back to facility with which I think is reasonable she tolerated morphine without significant issues.  Does appear appropriate for outpatient follow-up.  FINAL CLINICAL IMPRESSION(S) / ED DIAGNOSES   Final diagnoses:  Cholecystostomy care (HCC)  RUQ abdominal pain     Rx / DC Orders   ED Discharge Orders          Ordered    oxyCODONE-acetaminophen (PERCOCET) 5-325 MG tablet  Every 4 hours PRN        11/12/22 2007             Note:  This document was prepared using Dragon voice recognition software and may include unintentional dictation errors.    Willy Eddy, MD 11/12/22 2019

## 2022-11-15 DIAGNOSIS — K810 Acute cholecystitis: Secondary | ICD-10-CM | POA: Diagnosis not present

## 2022-11-15 DIAGNOSIS — I4891 Unspecified atrial fibrillation: Secondary | ICD-10-CM | POA: Diagnosis not present

## 2022-11-15 DIAGNOSIS — Z86718 Personal history of other venous thrombosis and embolism: Secondary | ICD-10-CM | POA: Diagnosis not present

## 2022-11-15 DIAGNOSIS — I5030 Unspecified diastolic (congestive) heart failure: Secondary | ICD-10-CM | POA: Diagnosis not present

## 2022-11-15 DIAGNOSIS — E785 Hyperlipidemia, unspecified: Secondary | ICD-10-CM | POA: Diagnosis not present

## 2022-11-15 DIAGNOSIS — I10 Essential (primary) hypertension: Secondary | ICD-10-CM | POA: Diagnosis not present

## 2022-11-15 DIAGNOSIS — Z01818 Encounter for other preprocedural examination: Secondary | ICD-10-CM | POA: Diagnosis not present

## 2022-11-15 DIAGNOSIS — I2510 Atherosclerotic heart disease of native coronary artery without angina pectoris: Secondary | ICD-10-CM | POA: Diagnosis not present

## 2022-11-15 DIAGNOSIS — K8020 Calculus of gallbladder without cholecystitis without obstruction: Secondary | ICD-10-CM | POA: Diagnosis not present

## 2022-11-15 DIAGNOSIS — R112 Nausea with vomiting, unspecified: Secondary | ICD-10-CM | POA: Diagnosis not present

## 2022-11-15 DIAGNOSIS — I482 Chronic atrial fibrillation, unspecified: Secondary | ICD-10-CM | POA: Diagnosis not present

## 2022-11-15 DIAGNOSIS — K219 Gastro-esophageal reflux disease without esophagitis: Secondary | ICD-10-CM | POA: Diagnosis not present

## 2022-11-15 DIAGNOSIS — I700 Atherosclerosis of aorta: Secondary | ICD-10-CM | POA: Diagnosis not present

## 2022-11-19 DIAGNOSIS — I2510 Atherosclerotic heart disease of native coronary artery without angina pectoris: Secondary | ICD-10-CM | POA: Diagnosis not present

## 2022-11-19 DIAGNOSIS — Z934 Other artificial openings of gastrointestinal tract status: Secondary | ICD-10-CM | POA: Diagnosis not present

## 2022-11-19 DIAGNOSIS — F32A Depression, unspecified: Secondary | ICD-10-CM | POA: Diagnosis not present

## 2022-11-19 DIAGNOSIS — Z7901 Long term (current) use of anticoagulants: Secondary | ICD-10-CM | POA: Diagnosis not present

## 2022-11-19 DIAGNOSIS — Z86718 Personal history of other venous thrombosis and embolism: Secondary | ICD-10-CM | POA: Diagnosis not present

## 2022-11-19 DIAGNOSIS — E785 Hyperlipidemia, unspecified: Secondary | ICD-10-CM | POA: Diagnosis not present

## 2022-11-19 DIAGNOSIS — K219 Gastro-esophageal reflux disease without esophagitis: Secondary | ICD-10-CM | POA: Diagnosis not present

## 2022-11-19 DIAGNOSIS — K8020 Calculus of gallbladder without cholecystitis without obstruction: Secondary | ICD-10-CM | POA: Diagnosis not present

## 2022-11-19 DIAGNOSIS — I482 Chronic atrial fibrillation, unspecified: Secondary | ICD-10-CM | POA: Diagnosis not present

## 2022-11-22 ENCOUNTER — Other Ambulatory Visit: Payer: Self-pay | Admitting: Internal Medicine

## 2022-11-22 DIAGNOSIS — K8020 Calculus of gallbladder without cholecystitis without obstruction: Secondary | ICD-10-CM | POA: Diagnosis not present

## 2022-11-22 DIAGNOSIS — Z48815 Encounter for surgical aftercare following surgery on the digestive system: Secondary | ICD-10-CM | POA: Diagnosis not present

## 2022-11-22 DIAGNOSIS — I130 Hypertensive heart and chronic kidney disease with heart failure and stage 1 through stage 4 chronic kidney disease, or unspecified chronic kidney disease: Secondary | ICD-10-CM | POA: Diagnosis not present

## 2022-11-22 DIAGNOSIS — I2510 Atherosclerotic heart disease of native coronary artery without angina pectoris: Secondary | ICD-10-CM | POA: Diagnosis not present

## 2022-11-22 DIAGNOSIS — K5900 Constipation, unspecified: Secondary | ICD-10-CM | POA: Diagnosis not present

## 2022-11-22 DIAGNOSIS — N1831 Chronic kidney disease, stage 3a: Secondary | ICD-10-CM | POA: Diagnosis not present

## 2022-11-22 DIAGNOSIS — I700 Atherosclerosis of aorta: Secondary | ICD-10-CM | POA: Diagnosis not present

## 2022-11-22 DIAGNOSIS — Z87891 Personal history of nicotine dependence: Secondary | ICD-10-CM | POA: Diagnosis not present

## 2022-11-22 DIAGNOSIS — Z4803 Encounter for change or removal of drains: Secondary | ICD-10-CM | POA: Diagnosis not present

## 2022-11-22 DIAGNOSIS — Z7901 Long term (current) use of anticoagulants: Secondary | ICD-10-CM | POA: Diagnosis not present

## 2022-11-22 DIAGNOSIS — F32A Depression, unspecified: Secondary | ICD-10-CM | POA: Diagnosis not present

## 2022-11-22 DIAGNOSIS — R1311 Dysphagia, oral phase: Secondary | ICD-10-CM | POA: Diagnosis not present

## 2022-11-22 DIAGNOSIS — E785 Hyperlipidemia, unspecified: Secondary | ICD-10-CM | POA: Diagnosis not present

## 2022-11-22 DIAGNOSIS — I10 Essential (primary) hypertension: Secondary | ICD-10-CM

## 2022-11-22 DIAGNOSIS — I252 Old myocardial infarction: Secondary | ICD-10-CM | POA: Diagnosis not present

## 2022-11-22 DIAGNOSIS — Z86718 Personal history of other venous thrombosis and embolism: Secondary | ICD-10-CM | POA: Diagnosis not present

## 2022-11-22 DIAGNOSIS — K219 Gastro-esophageal reflux disease without esophagitis: Secondary | ICD-10-CM | POA: Diagnosis not present

## 2022-11-22 DIAGNOSIS — I5030 Unspecified diastolic (congestive) heart failure: Secondary | ICD-10-CM | POA: Diagnosis not present

## 2022-11-22 DIAGNOSIS — E440 Moderate protein-calorie malnutrition: Secondary | ICD-10-CM | POA: Diagnosis not present

## 2022-11-23 ENCOUNTER — Other Ambulatory Visit: Payer: Medicare Other

## 2022-11-23 ENCOUNTER — Other Ambulatory Visit: Payer: Self-pay | Admitting: Internal Medicine

## 2022-11-23 DIAGNOSIS — I10 Essential (primary) hypertension: Secondary | ICD-10-CM

## 2022-11-25 ENCOUNTER — Inpatient Hospital Stay: Admission: RE | Admit: 2022-11-25 | Payer: Medicare Other | Source: Ambulatory Visit

## 2022-11-25 ENCOUNTER — Other Ambulatory Visit: Payer: Medicare Other

## 2022-11-25 ENCOUNTER — Other Ambulatory Visit: Payer: Self-pay | Admitting: Surgery

## 2022-11-25 DIAGNOSIS — Z48815 Encounter for surgical aftercare following surgery on the digestive system: Secondary | ICD-10-CM | POA: Diagnosis not present

## 2022-11-25 DIAGNOSIS — K8020 Calculus of gallbladder without cholecystitis without obstruction: Secondary | ICD-10-CM | POA: Diagnosis not present

## 2022-11-25 DIAGNOSIS — N1831 Chronic kidney disease, stage 3a: Secondary | ICD-10-CM | POA: Diagnosis not present

## 2022-11-25 DIAGNOSIS — I2510 Atherosclerotic heart disease of native coronary artery without angina pectoris: Secondary | ICD-10-CM | POA: Diagnosis not present

## 2022-11-25 DIAGNOSIS — K810 Acute cholecystitis: Secondary | ICD-10-CM

## 2022-11-25 DIAGNOSIS — I5030 Unspecified diastolic (congestive) heart failure: Secondary | ICD-10-CM | POA: Diagnosis not present

## 2022-11-25 DIAGNOSIS — I130 Hypertensive heart and chronic kidney disease with heart failure and stage 1 through stage 4 chronic kidney disease, or unspecified chronic kidney disease: Secondary | ICD-10-CM | POA: Diagnosis not present

## 2022-11-29 ENCOUNTER — Ambulatory Visit: Payer: Medicare Other | Admitting: Surgery

## 2022-11-30 ENCOUNTER — Other Ambulatory Visit: Payer: Medicare Other

## 2022-11-30 ENCOUNTER — Inpatient Hospital Stay
Admission: RE | Admit: 2022-11-30 | Discharge: 2022-11-30 | Disposition: A | Discharge status: Home / Self Care | Payer: Medicare Other | Source: Ambulatory Visit | Attending: Radiology | Admitting: Radiology

## 2022-11-30 ENCOUNTER — Inpatient Hospital Stay: Admission: RE | Admit: 2022-11-30 | Payer: Medicare Other | Source: Ambulatory Visit

## 2022-11-30 DIAGNOSIS — N1831 Chronic kidney disease, stage 3a: Secondary | ICD-10-CM | POA: Diagnosis not present

## 2022-11-30 DIAGNOSIS — I130 Hypertensive heart and chronic kidney disease with heart failure and stage 1 through stage 4 chronic kidney disease, or unspecified chronic kidney disease: Secondary | ICD-10-CM | POA: Diagnosis not present

## 2022-11-30 DIAGNOSIS — Z48815 Encounter for surgical aftercare following surgery on the digestive system: Secondary | ICD-10-CM | POA: Diagnosis not present

## 2022-11-30 DIAGNOSIS — I5030 Unspecified diastolic (congestive) heart failure: Secondary | ICD-10-CM | POA: Diagnosis not present

## 2022-11-30 DIAGNOSIS — K8020 Calculus of gallbladder without cholecystitis without obstruction: Secondary | ICD-10-CM | POA: Diagnosis not present

## 2022-11-30 DIAGNOSIS — I2510 Atherosclerotic heart disease of native coronary artery without angina pectoris: Secondary | ICD-10-CM | POA: Diagnosis not present

## 2022-12-02 ENCOUNTER — Inpatient Hospital Stay: Admission: RE | Admit: 2022-12-02 | Payer: Medicare Other | Source: Ambulatory Visit

## 2022-12-02 ENCOUNTER — Other Ambulatory Visit: Payer: Medicare Other

## 2022-12-06 ENCOUNTER — Ambulatory Visit: Payer: Medicare Other | Admitting: Surgery

## 2022-12-08 DIAGNOSIS — I2510 Atherosclerotic heart disease of native coronary artery without angina pectoris: Secondary | ICD-10-CM | POA: Diagnosis not present

## 2022-12-08 DIAGNOSIS — N1831 Chronic kidney disease, stage 3a: Secondary | ICD-10-CM | POA: Diagnosis not present

## 2022-12-08 DIAGNOSIS — I130 Hypertensive heart and chronic kidney disease with heart failure and stage 1 through stage 4 chronic kidney disease, or unspecified chronic kidney disease: Secondary | ICD-10-CM | POA: Diagnosis not present

## 2022-12-08 DIAGNOSIS — K8020 Calculus of gallbladder without cholecystitis without obstruction: Secondary | ICD-10-CM | POA: Diagnosis not present

## 2022-12-08 DIAGNOSIS — Z48815 Encounter for surgical aftercare following surgery on the digestive system: Secondary | ICD-10-CM | POA: Diagnosis not present

## 2022-12-08 DIAGNOSIS — I5030 Unspecified diastolic (congestive) heart failure: Secondary | ICD-10-CM | POA: Diagnosis not present

## 2022-12-09 ENCOUNTER — Ambulatory Visit
Admission: RE | Admit: 2022-12-09 | Discharge: 2022-12-09 | Disposition: A | Discharge status: Home / Self Care | Payer: Medicare Other | Source: Ambulatory Visit | Attending: Surgery | Admitting: Surgery

## 2022-12-09 ENCOUNTER — Other Ambulatory Visit: Payer: Self-pay | Admitting: Radiology

## 2022-12-09 ENCOUNTER — Ambulatory Visit
Admission: RE | Admit: 2022-12-09 | Discharge: 2022-12-09 | Disposition: A | Discharge status: Home / Self Care | Payer: Medicare Other | Source: Ambulatory Visit | Attending: Radiology | Admitting: Radiology

## 2022-12-09 DIAGNOSIS — N261 Atrophy of kidney (terminal): Secondary | ICD-10-CM | POA: Diagnosis not present

## 2022-12-09 DIAGNOSIS — K810 Acute cholecystitis: Secondary | ICD-10-CM | POA: Diagnosis not present

## 2022-12-09 DIAGNOSIS — F321 Major depressive disorder, single episode, moderate: Secondary | ICD-10-CM

## 2022-12-09 DIAGNOSIS — Z4803 Encounter for change or removal of drains: Secondary | ICD-10-CM | POA: Diagnosis not present

## 2022-12-09 DIAGNOSIS — I130 Hypertensive heart and chronic kidney disease with heart failure and stage 1 through stage 4 chronic kidney disease, or unspecified chronic kidney disease: Secondary | ICD-10-CM | POA: Diagnosis not present

## 2022-12-09 DIAGNOSIS — Z48815 Encounter for surgical aftercare following surgery on the digestive system: Secondary | ICD-10-CM | POA: Diagnosis not present

## 2022-12-09 DIAGNOSIS — K8020 Calculus of gallbladder without cholecystitis without obstruction: Secondary | ICD-10-CM | POA: Diagnosis not present

## 2022-12-09 DIAGNOSIS — K8000 Calculus of gallbladder with acute cholecystitis without obstruction: Secondary | ICD-10-CM | POA: Diagnosis not present

## 2022-12-09 DIAGNOSIS — I2510 Atherosclerotic heart disease of native coronary artery without angina pectoris: Secondary | ICD-10-CM | POA: Diagnosis not present

## 2022-12-09 DIAGNOSIS — I5030 Unspecified diastolic (congestive) heart failure: Secondary | ICD-10-CM | POA: Diagnosis not present

## 2022-12-09 DIAGNOSIS — N1831 Chronic kidney disease, stage 3a: Secondary | ICD-10-CM | POA: Diagnosis not present

## 2022-12-09 DIAGNOSIS — K449 Diaphragmatic hernia without obstruction or gangrene: Secondary | ICD-10-CM | POA: Diagnosis not present

## 2022-12-09 HISTORY — PX: IR RADIOLOGIST EVAL & MGMT: IMG5224

## 2022-12-09 HISTORY — PX: IR EXCHANGE BILIARY DRAIN: IMG6046

## 2022-12-09 MED ORDER — IOPAMIDOL (ISOVUE-300) INJECTION 61%
75.0000 mL | Freq: Once | INTRAVENOUS | Status: AC | PRN
  Administered 2022-12-09: 75 mL via INTRAVENOUS

## 2022-12-09 MED ORDER — IOPAMIDOL (ISOVUE-300) INJECTION 61%: 100.0000 mL | Freq: Once | INTRAVENOUS | Status: DC | PRN

## 2022-12-09 NOTE — Progress Notes (Signed)
Chief Complaint: Patient was seen in consultation today for cholecystostomy tube at the request of Bari Edward H  Referring Physician(s): Bari Edward H  History of Present Illness: Vanessa Farmer is a 87 y.o. female with a history of acute calculus cholecystitis.  She was not a good surgical candidate due to severity of illness and poor cardiac function.  Therefore, she underwent placement of a percutaneous cholecystostomy tube on 10/12/2022.  She presents today along with her daughter for follow-up.  She has generalized discomfort at the drain entry site.  Output has been small as expected approximately 10 mL daily.  She has completed rehab and is now home with her daughter.  She continues to feel extremely weak.  She has follow-up with her surgeon tomorrow.  Past Medical History:  Diagnosis Date   Anemia    Cataract    Depression    Fall    GERD (gastroesophageal reflux disease)    Hyperlipidemia    Hypertension    Myocardial infarction [facility])    "mild" many yrs ago    Past Surgical History:  Procedure Laterality Date   ABDOMINAL HYSTERECTOMY     CATARACT EXTRACTION, BILATERAL  2021   COLONOSCOPY WITH PROPOFOL N/A 12/05/2017   Procedure: COLONOSCOPY WITH PROPOFOL;  Surgeon: Midge Minium, MD;  Location: [facility];  Service: Endoscopy;  Laterality: N/A;   ESOPHAGOGASTRODUODENOSCOPY (EGD) WITH PROPOFOL N/A 12/05/2017   Procedure: ESOPHAGOGASTRODUODENOSCOPY (EGD) WITH PROPOFOL;  Surgeon: Midge Minium, MD;  Location: [facility];  Service: Endoscopy;  Laterality: N/A;   IR PERC CHOLECYSTOSTOMY  10/12/2022   IR RADIOLOGIST EVAL & MGMT  12/09/2022   POLYPECTOMY  12/05/2017   Procedure: POLYPECTOMY;  Surgeon: Midge Minium, MD;  Location: [facility];  Service: Endoscopy;;   TONSILLECTOMY      Allergies: Patient has no known allergies.  Medications: Prior to Admission medications   Medication Sig Start Date End Date Taking? Authorizing Provider   apixaban (ELIQUIS) 2.5 MG TABS tablet Take 1 tablet (2.5 mg total) by mouth 2 (two) times daily. 10/15/22   Delfino Lovett, MD  atorvastatin (LIPITOR) 40 MG tablet TAKE 1 TABLET(40 MG) BY MOUTH DAILY 07/30/22   Reubin Milan, MD  diltiazem (CARDIZEM CD) 240 MG 24 hr capsule Take 1 capsule (240 mg total) by mouth daily. 10/15/22   Delfino Lovett, MD  ENSURE (ENSURE) Take 237 mLs by mouth 2 (two) times daily between meals. 10/20/17 10/17/20  Reubin Milan, MD  furosemide (LASIX) 20 MG tablet TAKE 1 TABLET BY MOUTH TWICE A WEEK Patient taking differently: Take 20 mg by mouth daily. 09/21/22   Reubin Milan, MD  lisinopril (ZESTRIL) 20 MG tablet TAKE 1 TABLET(20 MG) BY MOUTH DAILY 11/23/22   Reubin Milan, MD  Multiple Vitamins-Calcium (ONE-A-DAY WOMENS PO) Take 1 tablet by mouth daily.    [provider]  oxyCODONE-acetaminophen (PERCOCET) 5-325 MG tablet Take 1 tablet by mouth every 4 (four) hours as needed for severe pain. 11/12/22 11/12/23  Willy Eddy, MD  pantoprazole (PROTONIX) 40 MG tablet TAKE 1 TABLET(40 MG) BY MOUTH DAILY 03/08/22   Reubin Milan, MD  polyethylene glycol (MIRALAX / GLYCOLAX) 17 g packet Take 17 g by mouth daily. 10/16/22   Delfino Lovett, MD  promethazine (PHENERGAN) 12.5 MG tablet Take by mouth. 10/22/22   [provider]  senna-docusate (SENOKOT-S) 8.6-50 MG tablet Take 1 tablet by mouth at bedtime as needed for mild constipation. 10/15/22   Delfino Lovett, MD  sertraline (  ZOLOFT) 50 MG tablet TAKE 1 TABLET(50 MG) BY MOUTH DAILY 07/30/22   Reubin Milan, MD  sodium chloride flush (NS) 0.9 % SOLN Inject 5 mLs into the vein daily. Flush 5 ccs of NS into cholecystostomy tube daily 10/14/22 12/13/22  Donovan Kail, PA-C  traMADol (ULTRAM) 50 MG tablet Take 50 mg by mouth 2 (two) times daily.    [provider]  Wound Dressings (MEDIHONEY WOUND/BURN DRESSING) GEL Apply topically. 10/19/22   [provider]     Family History  Problem  Relation Age of Onset   Heart disease Mother    Brain cancer Mother    Heart attack Father    Liver disease Daughter    Throat cancer Brother    Cancer Maternal Grandfather        Unsure type    Social History   Socioeconomic History   Marital status: Widowed    Spouse name: Not on file   Number of children: 4   Years of education: 9 th grade   Highest education level: 9th grade  Occupational History   Occupation: Retired  Tobacco Use   Smoking status: Never    Passive exposure: Never   Smokeless tobacco: Never   Tobacco comments:    smoking cessation materials not required  Vaping Use   Vaping status: Never Used  Substance and Sexual Activity   Alcohol use: No   Drug use: No   Sexual activity: Not Currently  Other Topics Concern   Not on file  Social History Narrative   Pt lives with her son   Social Determinants of Health   Financial Resource Strain: Low Risk  (01/29/2022)   Overall Financial Resource Strain (CARDIA)    Difficulty of Paying Living Expenses: Not hard at all  Food Insecurity: No Food Insecurity (10/11/2022)   Hunger Vital Sign    Worried About Running Out of Food in the Last Year: Never true    Ran Out of Food in the Last Year: Never true  Transportation Needs: No Transportation Needs (10/11/2022)   PRAPARE - Administrator, Civil Service (Medical): No    Lack of Transportation (Non-Medical): No  Physical Activity: Inactive (01/29/2022)   Exercise Vital Sign    Days of Exercise per Week: 0 days    Minutes of Exercise per Session: 0 min  Stress: No Stress Concern Present (01/29/2022)   [employer] of Occupational Health - Occupational Stress Questionnaire    Feeling of Stress : Not at all  Social Connections: Moderately Integrated (01/29/2022)   Social Connection and Isolation Panel [NHANES]    Frequency of Communication with Friends and Family: More than three times a week    Frequency of Social Gatherings with Friends and Family:  More than three times a week    Attends Religious Services: 1 to 4 times per year    Active Member of [employer] or Organizations: Yes    Attends Banker Meetings: Never    Marital Status: Widowed    Review of Systems: A 12 point ROS discussed and pertinent positives are indicated in the HPI above.  All other systems are negative.  Review of Systems  Vital Signs: There were no vitals taken for this visit.  Advance Care Plan: The advanced care plan/surrogate decision maker was discussed at the time of visit and the patient did not wish to discuss or was not able to name a surrogate decision maker or provide an advance care plan.  Physical Exam Constitutional:      General: She is not in acute distress.    Appearance: Normal appearance. She is normal weight.  HENT:     Head: Normocephalic and atraumatic.  Eyes:     General: No scleral icterus. Cardiovascular:     Rate and Rhythm: Normal rate.  Pulmonary:     Effort: Pulmonary effort is normal.  Abdominal:     General: Abdomen is flat. There is no distension.     Palpations: Abdomen is soft.    Skin:    General: Skin is warm and dry.  Neurological:     Mental Status: She is alert and oriented to person, place, and time.  Psychiatric:        Behavior: Behavior normal.       Imaging: IR Radiologist Eval & Mgmt  Result Date: 12/09/2022 EXAM: ESTABLISHED PATIENT OFFICE VISIT CHIEF COMPLAINT: SEE EPIC NOTE HISTORY OF PRESENT ILLNESS: SEE EPIC NOTE REVIEW OF SYSTEMS: SEE EPIC NOTE PHYSICAL EXAMINATION: SEE EPIC NOTE ASSESSMENT AND PLAN: SEE EPIC NOTE Electronically Signed   By: Malachy Moan M.D.   On: 12/09/2022 14:49   CT ABDOMEN PELVIS W CONTRAST  Result Date: 12/09/2022 CLINICAL DATA:  87 year old female with a history of acute calculus cholecystitis status post cholecystostomy tube placement on 06/04. EXAM: CT ABDOMEN AND PELVIS WITH CONTRAST TECHNIQUE: Multidetector CT imaging of the abdomen and pelvis was  performed using the standard protocol following bolus administration of intravenous contrast. RADIATION DOSE REDUCTION: This exam was performed according to the departmental dose-optimization program which includes automated exposure control, adjustment of the mA and/or kV according to patient size and/or use of iterative reconstruction technique. CONTRAST:  75mL ISOVUE-300 IOPAMIDOL (ISOVUE-300) INJECTION 61% COMPARISON:  Prior CT abdomen/pelvis 11/12/2022 FINDINGS: Lower chest: Resolved left pleural effusion. Residual nodular atelectasis versus scarring. Large sliding hiatal hernia. Hepatobiliary: Percutaneous cholecystostomy tube coiled within the fundus of the decompressed gallbladder. No biliary ductal dilatation. Normal hepatic contour morphology. No discrete hepatic lesion. Pancreas: Unremarkable. No pancreatic ductal dilatation or surrounding inflammatory changes. Spleen: Normal in size without focal abnormality. Adrenals/Urinary Tract: Normal adrenal glands. Atrophied left kidney. Right pelvic kidney is normal in appearance. No hydronephrosis or enhancing mass. Right renal vein drains into the hepatic IVC. Stomach/Bowel: Large sliding hiatal hernia. No bowel wall thickening or evidence of obstruction. Vascular/Lymphatic: Severe calcified atherosclerotic plaque throughout the vascular bed. Persistent left sided vena cava. Reproductive: Uterus and bilateral adnexa are unremarkable. Other: Significant pelvic floor laxity. No ascites. No abdominal wall hernia. Musculoskeletal: Severe levoconvex scoliosis of the lumbar spine with multilevel degenerative disc disease. No acute fracture or malalignment. IMPRESSION: 1. Percutaneous cholecystostomy tube within the collapsed gallbladder. No evidence of complication. 2. No biliary ductal dilation. 3. Large sliding hiatal hernia. 4. Right pelvic kidney with variant venous anatomy. The right renal vein drains into the hepatic IVC. 5. Persistent left inferior vena cava.  6. Atrophied left kidney. 7. Severe atherosclerotic vascular calcifications. 8. Additional ancillary findings as above without significant interval change. Electronically Signed   By: Malachy Moan M.D.   On: 12/09/2022 14:48   DG Chest Portable 1 View  Result Date: 11/12/2022 CLINICAL DATA:  Evaluate for infiltrate EXAM: PORTABLE CHEST 1 VIEW COMPARISON:  Chest x-ray 10/11/2022 FINDINGS: The heart is enlarged. There is a small left pleural effusion and minimal patchy opacities in the left lung base. Large hiatal hernia is again noted. There are atherosclerotic calcifications of the aorta. There is stable scarring in both lung apices. No  acute fractures are seen. IMPRESSION: 1. Small left pleural effusion and minimal patchy opacities in the left lung base. 2. Large hiatal hernia. 3. Stable cardiomegaly. Electronically Signed   By: Darliss Cheney M.D.   On: 11/12/2022 19:51   CT ABDOMEN PELVIS W CONTRAST  Result Date: 11/12/2022 CLINICAL DATA:  Pain at cholecystostomy site. Patient reports fluid stopped draining today. Able to flush strain. Drain placed 10/12/2022 EXAM: CT ABDOMEN AND PELVIS WITH CONTRAST TECHNIQUE: Multidetector CT imaging of the abdomen and pelvis was performed using the standard protocol following bolus administration of intravenous contrast. RADIATION DOSE REDUCTION: This exam was performed according to the departmental dose-optimization program which includes automated exposure control, adjustment of the mA and/or kV according to patient size and/or use of iterative reconstruction technique. CONTRAST:  80mL OMNIPAQUE IOHEXOL 300 MG/ML  SOLN COMPARISON:  No prior abdominal CT.  Chest CT 10/11/2022 reviewed FINDINGS: Lower chest: Small left pleural effusion is new from prior exam. There is associated compressive atelectasis in the left lower lobe. Trace right pleural effusion. Large hiatal hernia containing majority of the stomach as well as portions of transverse colon. Hepatobiliary:  Cholecystostomy tube appears coiled within the gallbladder which is decompressed. Known small gallstone is only faintly visualized. Gallbladder wall thickness of 4 mm is upper normal and likely accentuated by nondistention. There may be slight pericholecystic fat stranding. No fluid collection adjacent to the catheter. Slight prominence of central intrahepatic biliary tree. Distal common bile duct is dilated at 11 mm. No visualized choledocholithiasis. No evidence of focal hepatic lesion. Pancreas: Unremarkable. No pancreatic ductal dilatation or surrounding inflammatory changes. Spleen: Normal in size without focal abnormality. Adrenals/Urinary Tract: No adrenal nodule. Left renal parenchymal atrophy with compensatory hypertrophy of the right kidney. No hydronephrosis. No suspicious renal abnormality. Mild urinary bladder distension but no wall thickening. Stomach/Bowel: Large hiatal hernia with the majority of the stomach being intrathoracic. Diaphragmatic defect also contains portion of the transverse colon. No obstruction. No bowel wall thickening. Small to moderate volume of stool in the colon. Appendix not definitively seen. Vascular/Lymphatic: Advanced aortic atherosclerosis. No aneurysm. Patent portal vein. No bulky abdominopelvic adenopathy. Reproductive: Unremarkable.  Tubal ligation clips. Other: No free fluid or ascites.  No free intra-abdominal air. Musculoskeletal: Advanced scoliosis with moderate degenerative change. The bones are diffusely under mineralized. No acute osseous findings. IMPRESSION: 1. Cholecystostomy tube appears coiled within the gallbladder which is decompressed. Known small gallstone is only faintly visualized. Gallbladder wall thickness of 4 mm is upper normal and likely accentuated by nondistention. There may be slight pericholecystic fat stranding. No fluid collection adjacent to the catheter or gallbladder. 2. Slight prominence of central intrahepatic biliary tree. Distal common  bile duct is dilated at 11 mm. No visualized choledocholithiasis. 3. Large hiatal hernia containing majority of the stomach as well as portions of transverse colon. No obstruction. 4. Small left pleural effusion is new from prior exam. There is associated compressive atelectasis in the left lower lobe. Trace right pleural effusion. 5. Left renal parenchymal atrophy with compensatory hypertrophy of the right kidney. Aortic Atherosclerosis (ICD10-I70.0). Electronically Signed   By: Narda Rutherford M.D.   On: 11/12/2022 18:44    Labs:  CBC: Recent Labs    10/12/22 0242 10/13/22 0342 10/14/22 0441 11/12/22 1528  WBC 7.2 7.9 5.2 5.2  HGB 11.1* 11.2* 10.1* 12.1  HCT 33.9* 33.8* 30.1* 36.6  PLT 162 160 144* 218    COAGS: Recent Labs    10/11/22 0228 10/11/22 0823  INR 1.1  --  APTT  --  27    BMP: Recent Labs    10/12/22 0242 10/13/22 0342 10/14/22 0441 11/12/22 1528  NA 134* 133* 134* 131*  K 4.2 3.6 3.6 4.3  CL 100 100 102 96*  CO2 23 25 25 26   GLUCOSE 119* 101* 107* 110*  BUN 13 15 16  28*  CALCIUM 8.4* 8.1* 8.0* 9.5  CREATININE 0.99 0.92 1.03* 0.97  GFRNONAA 56* >60 53* 57*    LIVER FUNCTION TESTS: Recent Labs    01/19/22 1449 10/11/22 0413 10/13/22 0342 11/12/22 1528  BILITOT 0.4 0.6 1.3* 0.5  AST 16 25 107* 20  ALT 11 19 188* 18  ALKPHOS 102 80 76 79  PROT 6.9 7.1 5.6* 7.2  ALBUMIN 4.4 3.8 2.7* 3.5    TUMOR MARKERS: No results for input(s): "AFPTM", "CEA", "CA199", "CHROMGRNA" in the last 8760 hours.  Assessment and Plan:   Pleasant but frail 87 year old female with a history of acute calculus cholecystitis treated with percutaneous cholecystostomy tube placement in early July.  She has had 2 CT scans since the time of placement demonstrating that the drainage catheter is present in the gallbladder fundus, but the gallbladder is completely collapsed around the drainage catheter.  On contrast injection today, the injected contrast material fills the  fundus and then refluxes to the skin surface.  No contrast material could be forced through the remaining collapsed gallbladder lumen or into the biliary ducts.  There was no significant clogging of the tube.  Successful exchange for a new 10 French percutaneous cholecystostomy tube.  1.  Patient to return to interventional radiology in 3 months for cholecystostomy tube check and exchange.  Hopefully patient will have a better idea on whether or not she is a candidate for cholecystectomy given their improving clinical status.    Electronically Signed: Sterling Big 12/09/2022, 3:41 PM   I spent a total of  15 Minutes in face to face in clinical consultation, greater than 50% of which was counseling/coordinating care for cholecystostomy tube placement

## 2022-12-10 ENCOUNTER — Ambulatory Visit: Payer: Medicare Other | Admitting: Surgery

## 2022-12-13 ENCOUNTER — Encounter: Payer: Self-pay | Admitting: Surgery

## 2022-12-13 ENCOUNTER — Ambulatory Visit: Payer: Medicare Other | Admitting: Internal Medicine

## 2022-12-13 ENCOUNTER — Telehealth: Payer: Self-pay | Admitting: Internal Medicine

## 2022-12-13 ENCOUNTER — Ambulatory Visit: Payer: Self-pay | Admitting: *Deleted

## 2022-12-13 ENCOUNTER — Ambulatory Visit: Payer: Medicare Other | Admitting: Surgery

## 2022-12-13 ENCOUNTER — Ambulatory Visit (INDEPENDENT_AMBULATORY_CARE_PROVIDER_SITE_OTHER): Payer: Medicare Other | Admitting: Surgery

## 2022-12-13 VITALS — BP 201/96 | HR 111 | Temp 98.0°F | Wt 104.8 lb

## 2022-12-13 DIAGNOSIS — I10 Essential (primary) hypertension: Secondary | ICD-10-CM | POA: Diagnosis not present

## 2022-12-13 DIAGNOSIS — K810 Acute cholecystitis: Secondary | ICD-10-CM

## 2022-12-13 NOTE — Telephone Encounter (Signed)
Pt's daughter stated they have to reschedule today's appointment for tomorrow as pt is still in with her cardiologist.  Mentioned that pt needs refills on all active medications that Dr. Judithann Graves prescribes. Mentioned not being able to go to her brothers house as he is mentally unstable.Unsure if she mentioned this due to possibly leaving medication there.   Please advise.

## 2022-12-13 NOTE — Telephone Encounter (Signed)
  Chief Complaint: In Dr. Adelene Idler office.   Called in to say her BP is 200/94 and 201/94.  He is asking if pt can be seen today by Dr. Judithann Graves today.   She had a 3:40 appt so family is bring her to Dr. Karn Cassis office now from Dr. Adelene Idler office Symptoms: High BP, headache and fatigue Frequency: Now Pertinent Negatives: Patient denies N/A Disposition: [] ED /[] Urgent Care (no appt availability in office) / [x] Appointment(In office/virtual)/ []  [facility]/ [] Home Care/ [] Refused Recommended Disposition /[] [facility]/ []  Follow-up with PCP Additional Notes: See triage notes for Dr. Adelene Idler conversation with me regarding her BP.

## 2022-12-13 NOTE — Patient Instructions (Signed)
   Cholecystitis Cholecystitis is irritation and swelling (inflammation) of the gallbladder. The gallbladder: Is an organ that is shaped like a pear. Is under the liver on the right side of the body. Stores bile. Bile helps the body break down (digest) the fats in food. This condition can occur all of a sudden. It needs to be treated. What are the causes? This condition may be caused by stones or lumps that form in the gallbladder (gallstones). Gallstones can block the tube (duct) that carries bile out of your gallbladder. Other causes include: Damage to the gallbladder due to less blood flow. Germs in the bile ducts. Scars, kinks, or adhesions in the bile ducts. Abnormal growths (tumors) in the liver, pancreas, or gallbladder. What increases the risk? You are more likely to develop this condition if: You are female and between the ages of 24-62. You take birth control pills. You use estrogen. You take certain medicines that make you more likely to develop gallstones. You are overweight (obese). You have a very bad reaction to an infection (sepsis). You have been hospitalized due to a serious condition, such as a burn or illness. You have not eaten or drank for a long time. What are the signs or symptoms? Symptoms of this condition include: Pain in the upper right part of the belly (abdomen). A lump over the gallbladder. Bloating in the belly. Feeling sick to your stomach (nauseous). Vomiting. Fever. Chills. How is this treated? This condition may be treated with: Medicines to treat pain. Giving fluids through an IV tube. Not eating or drinking (fasting). Antibiotic medicines. Surgery to take out your gallbladder. Gallbladder drainage. Follow these instructions at home: Medicines  Take over-the-counter and prescription medicines only as told by your doctor. If you were prescribed an antibiotic medicine, take it as told by your doctor. Do not stop taking it even if you  start to feel better. General instructions Follow instructions from your doctor about what to eat or drink. Do not eat or drink anything that makes you sick again. Do not smoke or use any products that contain nicotine or tobacco. If you need help quitting, ask your doctor. Keep all follow-up visits. Contact a doctor if: You have pain and your medicine does not help. You have a fever. Get help right away if: Your pain moves to: Another part of your belly. Your back. Your symptoms do not go away. You have new symptoms. These symptoms may be an emergency. Get help right away. Call 911. Do not wait to see if the symptoms will go away. Do not drive yourself to the hospital. Summary This condition may be caused by stones or lumps that form in the gallbladder (gallstones). A common symptom is pain in your belly. This condition may be treated with surgery to take out your gallbladder. Follow instructions from your doctor about what to eat or drink. This information is not intended to replace advice given to you by your health care provider. Make sure you discuss any questions you have with your health care provider. Document Revised: 10/28/2020 Document Reviewed: 10/28/2020 Elsevier Patient Education  2024 [employer].

## 2022-12-13 NOTE — Progress Notes (Signed)
12/13/2022  History of Present Illness: Vanessa Farmer is a 87 y.o. female presenting for follow up of acute cholecystitis.  She was admitted on 10/11/22 with acute cholecystitis.  She has history of atrial fibrillation and DVT and is on Eliquis.  As such, she had a percutaneous drain placed on 10/12/22 instead of undergoing cholecystectomy.  She has had some issues with discomfort with the drain and has had a prior ED visit for this.  She recently had a follow up appointment with radiology and had a repeat CT scan and drain exchange on 12/09/22.  Her drain was in good position with a collapsed gallbladder around her drain.  Her cystic duct does not fill and upon contrast injection, it leaks around the drain.  The patient report today only discomfort at the drain insertion site.  She is eating and trying to regain strength but still feels weak.  She's had some nausea issues intermittently.  Past Medical History: Past Medical History:  Diagnosis Date   Anemia    Cataract    Depression    Fall    GERD (gastroesophageal reflux disease)    Hyperlipidemia    Hypertension    Myocardial infarction [facility])    "mild" many yrs ago     Past Surgical History: Past Surgical History:  Procedure Laterality Date   ABDOMINAL HYSTERECTOMY     CATARACT EXTRACTION, BILATERAL  2021   COLONOSCOPY WITH PROPOFOL N/A 12/05/2017   Procedure: COLONOSCOPY WITH PROPOFOL;  Surgeon: Midge Minium, MD;  Location: [facility];  Service: Endoscopy;  Laterality: N/A;   ESOPHAGOGASTRODUODENOSCOPY (EGD) WITH PROPOFOL N/A 12/05/2017   Procedure: ESOPHAGOGASTRODUODENOSCOPY (EGD) WITH PROPOFOL;  Surgeon: Midge Minium, MD;  Location: [facility];  Service: Endoscopy;  Laterality: N/A;   IR EXCHANGE BILIARY DRAIN  12/09/2022   IR PERC CHOLECYSTOSTOMY  10/12/2022   IR RADIOLOGIST EVAL & MGMT  12/09/2022   POLYPECTOMY  12/05/2017   Procedure: POLYPECTOMY;  Surgeon: Midge Minium, MD;  Location: [facility];  Service:  Endoscopy;;   TONSILLECTOMY      Home Medications: Prior to Admission medications   Medication Sig Start Date End Date Taking? Authorizing Provider  apixaban (ELIQUIS) 2.5 MG TABS tablet Take 1 tablet (2.5 mg total) by mouth 2 (two) times daily. 10/15/22  Yes Delfino Lovett, MD  atorvastatin (LIPITOR) 40 MG tablet TAKE 1 TABLET(40 MG) BY MOUTH DAILY 07/30/22  Yes Reubin Milan, MD  diltiazem (CARDIZEM CD) 240 MG 24 hr capsule Take 1 capsule (240 mg total) by mouth daily. 10/15/22  Yes Delfino Lovett, MD  furosemide (LASIX) 20 MG tablet TAKE 1 TABLET BY MOUTH TWICE A WEEK Patient taking differently: Take 20 mg by mouth daily. 09/21/22  Yes Reubin Milan, MD  lisinopril (ZESTRIL) 20 MG tablet TAKE 1 TABLET(20 MG) BY MOUTH DAILY 11/23/22  Yes Reubin Milan, MD  Multiple Vitamins-Calcium (ONE-A-DAY WOMENS PO) Take 1 tablet by mouth daily.   Yes [provider]  oxyCODONE-acetaminophen (PERCOCET) 5-325 MG tablet Take 1 tablet by mouth every 4 (four) hours as needed for severe pain. 11/12/22 11/12/23 Yes Willy Eddy, MD  pantoprazole (PROTONIX) 40 MG tablet TAKE 1 TABLET(40 MG) BY MOUTH DAILY 03/08/22  Yes Reubin Milan, MD  polyethylene glycol (MIRALAX / GLYCOLAX) 17 g packet Take 17 g by mouth daily. 10/16/22  Yes Delfino Lovett, MD  promethazine (PHENERGAN) 12.5 MG tablet Take by mouth. 10/22/22  Yes [provider]  senna-docusate (SENOKOT-S) 8.6-50 MG tablet Take 1  tablet by mouth at bedtime as needed for mild constipation. 10/15/22  Yes Delfino Lovett, MD  sertraline (ZOLOFT) 50 MG tablet TAKE 1 TABLET(50 MG) BY MOUTH DAILY 07/30/22  Yes Reubin Milan, MD  sodium chloride flush (NS) 0.9 % SOLN Inject 5 mLs into the vein daily. Flush 5 ccs of NS into cholecystostomy tube daily 10/14/22 12/13/22 Yes Donovan Kail, PA-C  traMADol (ULTRAM) 50 MG tablet Take 50 mg by mouth 2 (two) times daily.   Yes [provider]  Wound Dressings (MEDIHONEY WOUND/BURN DRESSING) GEL Apply  topically. 10/19/22  Yes [provider]  ENSURE (ENSURE) Take 237 mLs by mouth 2 (two) times daily between meals. 10/20/17 10/17/20  Reubin Milan, MD    Allergies: No Known Allergies  Review of Systems: Review of Systems  Constitutional:  Positive for malaise/fatigue. Negative for chills and fever.  Respiratory:  Negative for shortness of breath.   Cardiovascular:  Negative for chest pain.  Gastrointestinal:  Positive for abdominal pain (at drain insertion site), nausea and vomiting.    Physical Exam BP (!) 201/96   Pulse (!) 111   Temp 98 F (36.7 C) (Oral)   Wt 104 lb 12.8 oz (47.5 kg)   SpO2 94%   BMI 20.47 kg/m  CONSTITUTIONAL: No acute distress HEENT:  Normocephalic, atraumatic, extraocular motion intact. RESPIRATORY:  Normal respiratory effort without pathologic use of accessory muscles. CARDIOVASCULAR: Tachycardic initially, with very elevated BP, resolved tachycardia on recheck, but still hypertensive. GI: The abdomen is soft, non-distended, appropriately sore at the drain insertion site.  No evidence of complication or infection.  Drain with serous/bilious fluid in the bag.  NEUROLOGIC:  Motor and sensation is grossly normal.  Cranial nerves are grossly intact. PSYCH:  Alert and oriented to person, place and time. Affect is normal.  Labs/Imaging: CT abdomen/pelvis on 12/09/22: IMPRESSION: 1. Percutaneous cholecystostomy tube within the collapsed gallbladder. No evidence of complication. 2. No biliary ductal dilation. 3. Large sliding hiatal hernia. 4. Right pelvic kidney with variant venous anatomy. The right renal vein drains into the hepatic IVC. 5. Persistent left inferior vena cava. 6. Atrophied left kidney. 7. Severe atherosclerotic vascular calcifications. 8. Additional ancillary findings as above without significant interval change.  IR drain exchange on 12/09/22: IMPRESSION: 1. The gallbladder lumen is completely collapsed. No contrast extends  into the cystic or common bile ducts. 2. Successful exchange for a new 10 French percutaneous cholecystostomy tube.   Assessment and Plan: This is a 87 y.o. female with acute cholecystitis, s/p percutaneous cholecystostomy drain placement.  --Discussed with the patient the findings on her CT scan and drain exchange.  Her gallbladder is collapsed around the drain and there is no contrast that fills the cystic duct.  At this point, we would be unable to remove the drain as her symptoms would recur.  Discussed with her that the main options would be to leave the drain indefinitely vs proceeding with cholecystectomy.  However, the patient still does not feel full recovered and feels weak.  Today, she also is very hypertensive, despite her taking her home meds this morning.  Her BP is very elevated to 201/96 and similar on recheck.  Unclear why as her BP reads in prior office visits has been better.  She had an appointment with Dr. Judithann Graves today which was rescheduled as the patient came here early.  I discussed with Dr. Karn Cassis nurse about her hypertension and they rescheduled her appointment so she can go see her now.  She would need better BP control prior to surgery. --Given her not being fully recovered and her hypertension, for now I think it's appropriate to hold off on any surgery scheduling.  She can continue flushing her drain and doing dressing changes as instructed.  Will follow up with her in a month to reassess. --She's in agreement with this plan and also does want to wait for surgery so she can get stronger/better.  I spent 30 minutes dedicated to the care of this patient on the date of this encounter to include pre-visit review of records, face-to-face time with the patient discussing diagnosis and management, and any post-visit coordination of care.   Howie Ill, MD [facility]

## 2022-12-13 NOTE — Telephone Encounter (Signed)
Reason for Disposition  [1] Systolic BP  >= 200 OR Diastolic >= 120 AND [2] having NO cardiac or neurologic symptoms    Pt coming from Dr. Adelene Idler office to Dr. Karn Cassis office now.  Answer Assessment - Initial Assessment Questions 1. BLOOD PRESSURE: "What is the blood pressure?" "Did you take at least two measurements 5 minutes apart?"    Daughter Steward Drone called in.   BP 200/94.    I'm with the doctor now.   The dr got on the line Dr. Aleen Campi.   Her BP here in my office is  201/96   200/94   HR 111   now 96 pulse.    Not feeling well and fatigued with a headache.    She has an appt tomorrow with Dr. Judithann Graves.    Can she be seen today?   If not I'm going to send her to the ED.   I checked and Dr. Judithann Graves has a 3:40 appt so I scheduled her for that because the daughter said they could have her here by 3:40 to see Dr. Judithann Graves.   Dr Aleen Campi, surgeon thanked me for working her in.   I called Dr. Karn Cassis office and spoke with Marylene Land.   I let her know what is going on and that the pt and her daughter were on the way to the office now.       2. ONSET: "When did you take your blood pressure?"     Just now in Dr. Adelene Idler office. 3. HOW: "How did you take your blood pressure?" (e.g., automatic home BP monitor, visiting nurse)     Not asked 4. HISTORY: "Do you have a history of high blood pressure?"     Not asked 5. MEDICINES: "Are you taking any medicines for blood pressure?" "Have you missed any doses recently?"     Not asked 6. OTHER SYMPTOMS: "Do you have any symptoms?" (e.g., blurred vision, chest pain, difficulty breathing, headache, weakness)     Headache, fatigued and not feeling well 7. PREGNANCY: "Is there any chance you are pregnant?" "When was your last menstrual period?"     N/A due to age  Protocols used: Blood Pressure - High-A-AH

## 2022-12-13 NOTE — Telephone Encounter (Signed)
[facility] called to inform the office that patient had an appointment somewhere else but was told her bp is 200/95, and they checked it again later before she left but the bp was still the same. They suggested pt see her Primary care. Patient got scheduled an appt from [facility] but came in more than 20 mins late, and Dr. Judithann Graves was unable to see her at that time and requested that patient go to ED. Patient daughter said she would take her to ED but did state that she has a hard time getting pt In and out of car.

## 2022-12-14 ENCOUNTER — Encounter: Payer: Self-pay | Admitting: Internal Medicine

## 2022-12-14 ENCOUNTER — Ambulatory Visit: Payer: Medicare Other | Admitting: Internal Medicine

## 2022-12-14 NOTE — Progress Notes (Deleted)
Date:  12/14/2022   Name:  Vanessa Farmer   DOB:  [birth year]   MRN:  [id number]   Chief Complaint: No chief complaint on file.  Hypertension This is a chronic problem. The problem is uncontrolled (very high at surgeon's office yesterday.). Past treatments include calcium channel blockers, ACE inhibitors and diuretics. The current treatment provides moderate improvement.    Lab Results  Component Value Date   NA 131 (L) 11/12/2022   K 4.3 11/12/2022   CO2 26 11/12/2022   GLUCOSE 110 (H) 11/12/2022   BUN 28 (H) 11/12/2022   CREATININE 0.97 11/12/2022   CALCIUM 9.5 11/12/2022   EGFR 48 (L) 01/19/2022   GFRNONAA 57 (L) 11/12/2022   Lab Results  Component Value Date   CHOL 162 10/17/2020   HDL 71 10/17/2020   LDLCALC 80 10/17/2020   TRIG 52 10/17/2020   CHOLHDL 2.3 10/17/2020   Lab Results  Component Value Date   TSH 1.377 04/04/2022   No results found for: "HGBA1C" Lab Results  Component Value Date   WBC 5.2 11/12/2022   HGB 12.1 11/12/2022   HCT 36.6 11/12/2022   MCV 96.1 11/12/2022   PLT 218 11/12/2022   Lab Results  Component Value Date   ALT 18 11/12/2022   AST 20 11/12/2022   ALKPHOS 79 11/12/2022   BILITOT 0.5 11/12/2022   Lab Results  Component Value Date   VD25OH 43.1 06/10/2017     Review of Systems  Patient Active Problem List   Diagnosis Date Noted   Cholelithiasis 10/11/2022   Chronic kidney disease, stage 3a (HCC) 10/11/2022   Protein-calorie malnutrition, moderate (HCC) 10/11/2022   QT prolongation 10/11/2022   History of DVT of lower extremity 04/05/2022   (HFpEF) heart failure with preserved ejection fraction (HCC) 04/05/2022   Atrial fibrillation with RVR (HCC) 04/04/2022   Hypokalemia 12/31/2021   Aortic atherosclerosis (HCC) 10/20/2020   Ganglion cyst of wrist, right 03/20/2019   Iron deficiency anemia    Polyp of sigmoid colon    Pancytopenia (HCC) 09/30/2017   GERD (gastroesophageal reflux disease) 06/10/2017   Hyperlipidemia  06/08/2016   Scoliosis 06/07/2016   Osteoporosis 06/07/2016   Essential hypertension 06/07/2016   CAD (coronary artery disease) 06/07/2016   Depression, major, single episode, moderate (HCC) 06/07/2016    No Known Allergies  Past Surgical History:  Procedure Laterality Date   ABDOMINAL HYSTERECTOMY     CATARACT EXTRACTION, BILATERAL  2021   COLONOSCOPY WITH PROPOFOL N/A 12/05/2017   Procedure: COLONOSCOPY WITH PROPOFOL;  Surgeon: Midge Minium, MD;  Location: [facility];  Service: Endoscopy;  Laterality: N/A;   ESOPHAGOGASTRODUODENOSCOPY (EGD) WITH PROPOFOL N/A 12/05/2017   Procedure: ESOPHAGOGASTRODUODENOSCOPY (EGD) WITH PROPOFOL;  Surgeon: Midge Minium, MD;  Location: [facility];  Service: Endoscopy;  Laterality: N/A;   IR EXCHANGE BILIARY DRAIN  12/09/2022   IR PERC CHOLECYSTOSTOMY  10/12/2022   IR RADIOLOGIST EVAL & MGMT  12/09/2022   POLYPECTOMY  12/05/2017   Procedure: POLYPECTOMY;  Surgeon: Midge Minium, MD;  Location: [facility];  Service: Endoscopy;;   TONSILLECTOMY      Social History   Tobacco Use   Smoking status: Never    Passive exposure: Never   Smokeless tobacco: Never   Tobacco comments:    smoking cessation materials not required  Vaping Use   Vaping status: Never Used  Substance Use Topics   Alcohol use: No   Drug use: No     Medication list has  been reviewed and updated.  No outpatient medications have been marked as taking for the 12/14/22 encounter (Appointment) with Reubin Milan, MD.       02/22/2022    3:16 PM 01/19/2022    2:18 PM 01/24/2021    2:24 PM 10/17/2020    8:11 AM  GAD 7 : Generalized Anxiety Score  Nervous, Anxious, on Edge 1 0 1 0  Control/stop worrying 0 0 2 0  Worry too much - different things 0 0 2 0  Trouble relaxing 0 0 0 0  Restless 0 0 0 0  Easily annoyed or irritable 0 0 1 0  Afraid - awful might happen 0 0 0 0  Total GAD 7 Score 1 0 6 0  Anxiety Difficulty Not difficult at all Not difficult at  all Not difficult at all        02/22/2022    3:16 PM 01/29/2022    2:58 PM 01/19/2022    2:18 PM  Depression screen PHQ 2/9  Decreased Interest 0 0 1  Down, Depressed, Hopeless 0 0 1  PHQ - 2 Score 0 0 2  Altered sleeping 0 0 0  Tired, decreased energy 0 0 0  Change in appetite 0 0 0  Feeling bad or failure about yourself  0 0 0  Trouble concentrating 0 0 0  Moving slowly or fidgety/restless 0 0 0  Suicidal thoughts 0 0 0  PHQ-9 Score 0 0 2  Difficult doing work/chores Not difficult at all Not difficult at all Not difficult at all    BP Readings from Last 3 Encounters:  12/13/22 (!) 201/96  11/12/22 (!) 175/78  10/27/22 128/74    Physical Exam  Wt Readings from Last 3 Encounters:  12/13/22 104 lb 12.8 oz (47.5 kg)  11/12/22 120 lb (54.4 kg)  10/27/22 120 lb (54.4 kg)    There were no vitals taken for this visit.  Assessment and Plan:  Problem List Items Addressed This Visit   None   No follow-ups on file.    Reubin Milan, MD [facility] [place]

## 2022-12-15 ENCOUNTER — Ambulatory Visit: Payer: Self-pay

## 2022-12-15 DIAGNOSIS — I2510 Atherosclerotic heart disease of native coronary artery without angina pectoris: Secondary | ICD-10-CM | POA: Diagnosis not present

## 2022-12-15 DIAGNOSIS — I5030 Unspecified diastolic (congestive) heart failure: Secondary | ICD-10-CM | POA: Diagnosis not present

## 2022-12-15 DIAGNOSIS — K8020 Calculus of gallbladder without cholecystitis without obstruction: Secondary | ICD-10-CM | POA: Diagnosis not present

## 2022-12-15 DIAGNOSIS — Z48815 Encounter for surgical aftercare following surgery on the digestive system: Secondary | ICD-10-CM | POA: Diagnosis not present

## 2022-12-15 DIAGNOSIS — I130 Hypertensive heart and chronic kidney disease with heart failure and stage 1 through stage 4 chronic kidney disease, or unspecified chronic kidney disease: Secondary | ICD-10-CM | POA: Diagnosis not present

## 2022-12-15 DIAGNOSIS — N1831 Chronic kidney disease, stage 3a: Secondary | ICD-10-CM | POA: Diagnosis not present

## 2022-12-15 NOTE — Telephone Encounter (Signed)
Eliquis Lisinopril Furosemide Diltiazem  Sertraline  200/110 then today 178/82 Asymptomatic-  Called pt and VM full. Called [place] and unable to LM on VM. Called [place] and VM full. Unable to contact pt.   Pt needs appt and refills on above meds.  Reason for Disposition . Third attempt to contact caller AND no contact made. Phone number verified.  Protocols used: No Contact or Duplicate Contact Call-A-AH

## 2022-12-15 NOTE — Telephone Encounter (Signed)
Called pt could not leave VM. VM full.  KP

## 2022-12-16 NOTE — Telephone Encounter (Signed)
Vanessa Farmer, pt's daughter called back.  Pt has elevated BP. Current BP is 180/85. Pt was given lisinopril and lasix about 1 hour ago. Pt has no other s/s other than weakness, which is not new.  Appt for tomorrow morning. Daughter will continue to monitor pt and call back if BP goes up or pt shows worrisome s/s.

## 2022-12-16 NOTE — Telephone Encounter (Signed)
Reason for Disposition . [1] Systolic BP  >= 180 OR Diastolic >= 110 AND [2] missed most recent dose of blood pressure medication  Answer Assessment - Initial Assessment Questions 1. BLOOD PRESSURE: "What is the blood pressure?" "Did you take at least two measurements 5 minutes apart?"     180/85 2. ONSET: "When did you take your blood pressure?"     185/85 3. HOW: "How did you take your blood pressure?" (e.g., automatic home BP monitor, visiting nurse)     Home monitor 4. HISTORY: "Do you have a history of high blood pressure?"     Yes 5. MEDICINES: "Are you taking any medicines for blood pressure?" "Have you missed any doses recently?"     Yes - just took 1 hour ago lisinopril and lasix 6. OTHER SYMPTOMS: "Do you have any symptoms?" (e.g., blurred vision, chest pain, difficulty breathing, headache, weakness)     no  Protocols used: Blood Pressure - High-A-AH

## 2022-12-16 NOTE — Telephone Encounter (Signed)
Pt has an appt scheduled for tomorrow 12/17/22.  KP

## 2022-12-17 ENCOUNTER — Encounter: Payer: Self-pay | Admitting: Internal Medicine

## 2022-12-17 ENCOUNTER — Ambulatory Visit (INDEPENDENT_AMBULATORY_CARE_PROVIDER_SITE_OTHER): Payer: Medicare Other | Admitting: Internal Medicine

## 2022-12-17 VITALS — BP 150/78 | HR 97 | Ht 60.0 in | Wt 105.0 lb

## 2022-12-17 DIAGNOSIS — I482 Chronic atrial fibrillation, unspecified: Secondary | ICD-10-CM

## 2022-12-17 DIAGNOSIS — E785 Hyperlipidemia, unspecified: Secondary | ICD-10-CM | POA: Diagnosis not present

## 2022-12-17 DIAGNOSIS — F321 Major depressive disorder, single episode, moderate: Secondary | ICD-10-CM

## 2022-12-17 DIAGNOSIS — I10 Essential (primary) hypertension: Secondary | ICD-10-CM | POA: Diagnosis not present

## 2022-12-17 DIAGNOSIS — K8011 Calculus of gallbladder with chronic cholecystitis with obstruction: Secondary | ICD-10-CM

## 2022-12-17 DIAGNOSIS — N1832 Chronic kidney disease, stage 3b: Secondary | ICD-10-CM | POA: Diagnosis not present

## 2022-12-17 DIAGNOSIS — K219 Gastro-esophageal reflux disease without esophagitis: Secondary | ICD-10-CM

## 2022-12-17 MED ORDER — LISINOPRIL 20 MG PO TABS
20.0000 mg | ORAL_TABLET | Freq: Every day | ORAL | 0 refills | Status: DC
Start: 2022-12-17 — End: 2023-01-24

## 2022-12-17 MED ORDER — PANTOPRAZOLE SODIUM 40 MG PO TBEC
40.0000 mg | DELAYED_RELEASE_TABLET | Freq: Every day | ORAL | 0 refills | Status: DC
Start: 2022-12-17 — End: 2023-03-29

## 2022-12-17 MED ORDER — APIXABAN 2.5 MG PO TABS
2.5000 mg | ORAL_TABLET | Freq: Two times a day (BID) | ORAL | 0 refills | Status: DC
Start: 2022-12-17 — End: 2023-03-29

## 2022-12-17 MED ORDER — SERTRALINE HCL 50 MG PO TABS
50.0000 mg | ORAL_TABLET | Freq: Every day | ORAL | 0 refills | Status: DC
Start: 2022-12-17 — End: 2023-03-29

## 2022-12-17 MED ORDER — ATORVASTATIN CALCIUM 40 MG PO TABS
40.0000 mg | ORAL_TABLET | Freq: Every day | ORAL | 0 refills | Status: DC
Start: 2022-12-17 — End: 2023-03-29

## 2022-12-17 MED ORDER — DILTIAZEM HCL ER COATED BEADS 240 MG PO CP24
240.0000 mg | ORAL_CAPSULE | Freq: Every day | ORAL | 0 refills | Status: DC
Start: 2022-12-17 — End: 2023-03-29

## 2022-12-17 MED ORDER — FUROSEMIDE 20 MG PO TABS
20.0000 mg | ORAL_TABLET | Freq: Every day | ORAL | 0 refills | Status: DC
Start: 2022-12-17 — End: 2023-03-29

## 2022-12-17 NOTE — Assessment & Plan Note (Signed)
Clinically active with worsening of symptoms, No SI or HI. She has been out of Sertraline for an indeterminate amount of time. Will resume Sertraline

## 2022-12-17 NOTE — Assessment & Plan Note (Signed)
LDL is  Lab Results  Component Value Date   LDLCALC 80 10/17/2020   Currently being treated with atorvastatin with good compliance and no concerns.

## 2022-12-17 NOTE — Progress Notes (Signed)
Date:  12/17/2022   Name:  Vanessa Farmer   DOB:  06-12-1935   MRN:  [id number]   Chief Complaint: Hypertension  Hypertension This is a chronic problem. The problem is uncontrolled. Pertinent negatives include no chest pain, headaches, palpitations or shortness of breath. Past treatments include ACE inhibitors, calcium channel blockers and diuretics. The current treatment provides moderate improvement.  Depression        This is a chronic problem.The problem is unchanged.  Associated symptoms include no fatigue and no headaches.  Past treatments include SSRIs - Selective serotonin reuptake inhibitors. Hyperlipidemia This is a chronic problem. The problem is controlled. Pertinent negatives include no chest pain or shortness of breath. Current antihyperlipidemic treatment includes statins. The current treatment provides significant improvement of lipids.    Lab Results  Component Value Date   NA 131 (L) 11/12/2022   K 4.3 11/12/2022   CO2 26 11/12/2022   GLUCOSE 110 (H) 11/12/2022   BUN 28 (H) 11/12/2022   CREATININE 0.97 11/12/2022   CALCIUM 9.5 11/12/2022   EGFR 48 (L) 01/19/2022   GFRNONAA 57 (L) 11/12/2022   Lab Results  Component Value Date   CHOL 162 10/17/2020   HDL 71 10/17/2020   LDLCALC 80 10/17/2020   TRIG 52 10/17/2020   CHOLHDL 2.3 10/17/2020   Lab Results  Component Value Date   TSH 1.377 04/04/2022   No results found for: "HGBA1C" Lab Results  Component Value Date   WBC 5.2 11/12/2022   HGB 12.1 11/12/2022   HCT 36.6 11/12/2022   MCV 96.1 11/12/2022   PLT 218 11/12/2022   Lab Results  Component Value Date   ALT 18 11/12/2022   AST 20 11/12/2022   ALKPHOS 79 11/12/2022   BILITOT 0.5 11/12/2022   Lab Results  Component Value Date   VD25OH 43.1 06/10/2017     Review of Systems  Constitutional:  Negative for fatigue and unexpected weight change.  HENT:  Negative for trouble swallowing.   Eyes:  Negative for visual disturbance.  Respiratory:   Negative for cough, chest tightness, shortness of breath and wheezing.   Cardiovascular:  Positive for leg swelling. Negative for chest pain and palpitations.  Gastrointestinal:  Negative for abdominal distention, abdominal pain, constipation and diarrhea.  Musculoskeletal:  Positive for arthralgias, back pain and gait problem.  Neurological:  Positive for weakness. Negative for dizziness, light-headedness and headaches.  Psychiatric/Behavioral:  Positive for depression, dysphoric mood and sleep disturbance. The patient is nervous/anxious.     Patient Active Problem List   Diagnosis Date Noted   Stage 3b chronic kidney disease (HCC) 12/17/2022   Calculus of gallbladder with chronic cholecystitis with obstruction 10/11/2022   Chronic kidney disease, stage 3a (HCC) 10/11/2022   Protein-calorie malnutrition, moderate (HCC) 10/11/2022   QT prolongation 10/11/2022   History of DVT of lower extremity 04/05/2022   (HFpEF) heart failure with preserved ejection fraction (HCC) 04/05/2022   Atrial fibrillation with RVR (HCC) 04/04/2022   Hypokalemia 12/31/2021   Ganglion cyst of wrist, right 03/20/2019   Iron deficiency anemia    Polyp of sigmoid colon    GERD (gastroesophageal reflux disease) 06/10/2017   Hyperlipidemia 06/08/2016   Scoliosis 06/07/2016   Osteoporosis 06/07/2016   Essential hypertension 06/07/2016   CAD (coronary artery disease) 06/07/2016   Depression, major, single episode, moderate (HCC) 06/07/2016    No Known Allergies  Past Surgical History:  Procedure Laterality Date   ABDOMINAL HYSTERECTOMY     CATARACT EXTRACTION,  BILATERAL  2021   COLONOSCOPY WITH PROPOFOL N/A 12/05/2017   Procedure: COLONOSCOPY WITH PROPOFOL;  Surgeon: Midge Minium, MD;  Location: [facility];  Service: Endoscopy;  Laterality: N/A;   ESOPHAGOGASTRODUODENOSCOPY (EGD) WITH PROPOFOL N/A 12/05/2017   Procedure: ESOPHAGOGASTRODUODENOSCOPY (EGD) WITH PROPOFOL;  Surgeon: Midge Minium, MD;   Location: [facility];  Service: Endoscopy;  Laterality: N/A;   IR EXCHANGE BILIARY DRAIN  12/09/2022   IR PERC CHOLECYSTOSTOMY  10/12/2022   IR RADIOLOGIST EVAL & MGMT  12/09/2022   POLYPECTOMY  12/05/2017   Procedure: POLYPECTOMY;  Surgeon: Midge Minium, MD;  Location: [facility];  Service: Endoscopy;;   TONSILLECTOMY      Social History   Tobacco Use   Smoking status: Never    Passive exposure: Never   Smokeless tobacco: Never   Tobacco comments:    smoking cessation materials not required  Vaping Use   Vaping status: Never Used  Substance Use Topics   Alcohol use: No   Drug use: No     Medication list has been reviewed and updated.  Current Meds  Medication Sig   Multiple Vitamins-Calcium (ONE-A-DAY WOMENS PO) Take 1 tablet by mouth daily.   oxyCODONE-acetaminophen (PERCOCET) 5-325 MG tablet Take 1 tablet by mouth every 4 (four) hours as needed for severe pain.   polyethylene glycol (MIRALAX / GLYCOLAX) 17 g packet Take 17 g by mouth daily.   promethazine (PHENERGAN) 12.5 MG tablet Take by mouth.   senna-docusate (SENOKOT-S) 8.6-50 MG tablet Take 1 tablet by mouth at bedtime as needed for mild constipation.   traMADol (ULTRAM) 50 MG tablet Take 50 mg by mouth 2 (two) times daily.   Wound Dressings (MEDIHONEY WOUND/BURN DRESSING) GEL Apply topically.   [DISCONTINUED] apixaban (ELIQUIS) 2.5 MG TABS tablet Take 1 tablet (2.5 mg total) by mouth 2 (two) times daily.   [DISCONTINUED] atorvastatin (LIPITOR) 40 MG tablet TAKE 1 TABLET(40 MG) BY MOUTH DAILY   [DISCONTINUED] diltiazem (CARDIZEM CD) 240 MG 24 hr capsule Take 1 capsule (240 mg total) by mouth daily.   [DISCONTINUED] furosemide (LASIX) 20 MG tablet TAKE 1 TABLET BY MOUTH TWICE A WEEK (Patient taking differently: Take 20 mg by mouth daily.)   [DISCONTINUED] lisinopril (ZESTRIL) 20 MG tablet TAKE 1 TABLET(20 MG) BY MOUTH DAILY   [DISCONTINUED] pantoprazole (PROTONIX) 40 MG tablet TAKE 1 TABLET(40 MG) BY MOUTH  DAILY   [DISCONTINUED] sertraline (ZOLOFT) 50 MG tablet TAKE 1 TABLET(50 MG) BY MOUTH DAILY       02/22/2022    3:16 PM 01/19/2022    2:18 PM 01/24/2021    2:24 PM 10/17/2020    8:11 AM  GAD 7 : Generalized Anxiety Score  Nervous, Anxious, on Edge 1 0 1 0  Control/stop worrying 0 0 2 0  Worry too much - different things 0 0 2 0  Trouble relaxing 0 0 0 0  Restless 0 0 0 0  Easily annoyed or irritable 0 0 1 0  Afraid - awful might happen 0 0 0 0  Total GAD 7 Score 1 0 6 0  Anxiety Difficulty Not difficult at all Not difficult at all Not difficult at all        02/22/2022    3:16 PM 01/29/2022    2:58 PM 01/19/2022    2:18 PM  Depression screen PHQ 2/9  Decreased Interest 0 0 1  Down, Depressed, Hopeless 0 0 1  PHQ - 2 Score 0 0 2  Altered sleeping 0 0  0  Tired, decreased energy 0 0 0  Change in appetite 0 0 0  Feeling bad or failure about yourself  0 0 0  Trouble concentrating 0 0 0  Moving slowly or fidgety/restless 0 0 0  Suicidal thoughts 0 0 0  PHQ-9 Score 0 0 2  Difficult doing work/chores Not difficult at all Not difficult at all Not difficult at all    BP Readings from Last 3 Encounters:  12/17/22 (!) 150/78  12/13/22 (!) 201/96  11/12/22 (!) 175/78    Physical Exam Vitals and nursing note reviewed.  Constitutional:      General: She is not in acute distress.    Appearance: She is well-developed.  HENT:     Head: Normocephalic and atraumatic.  Neck:     Vascular: No carotid bruit.  Cardiovascular:     Rate and Rhythm: Normal rate. Rhythm irregular.  Pulmonary:     Effort: Pulmonary effort is normal. No respiratory distress.     Breath sounds: No wheezing or rhonchi.  Abdominal:     General: Abdomen is flat.     Comments: RUQ drain in place  Musculoskeletal:        General: Swelling and deformity (scoliosis of thoracic and lumbar spine) present.     Cervical back: Normal range of motion.  Lymphadenopathy:     Cervical: No cervical adenopathy.   Skin:    General: Skin is warm and dry.     Findings: No rash.  Neurological:     Mental Status: She is alert and oriented to person, place, and time.  Psychiatric:        Mood and Affect: Mood normal.        Behavior: Behavior normal.     Wt Readings from Last 3 Encounters:  12/17/22 105 lb (47.6 kg)  12/13/22 104 lb 12.8 oz (47.5 kg)  11/12/22 120 lb (54.4 kg)    BP (!) 150/78 (BP Location: Left Arm, Cuff Size: Normal)   Pulse 97   Ht 5' (1.524 m)   Wt 105 lb (47.6 kg)   SpO2 95%   BMI 20.51 kg/m   Assessment and Plan:  Problem List Items Addressed This Visit       Unprioritized   Stage 3b chronic kidney disease (HCC)   Hyperlipidemia (Chronic)    LDL is  Lab Results  Component Value Date   LDLCALC 80 10/17/2020   Currently being treated with atorvastatin with good compliance and no concerns.       Relevant Medications   furosemide (LASIX) 20 MG tablet   lisinopril (ZESTRIL) 20 MG tablet   diltiazem (CARDIZEM CD) 240 MG 24 hr capsule   apixaban (ELIQUIS) 2.5 MG TABS tablet   atorvastatin (LIPITOR) 40 MG tablet   GERD (gastroesophageal reflux disease) (Chronic)   Relevant Medications   pantoprazole (PROTONIX) 40 MG tablet   Essential hypertension - Primary (Chronic)    BP uncontrolled due to medication noncompliance Has need been seen here in some time - recently at Surgeon office and BP was very high. Will resume lisinopril, lasix and cardiazem. Follow up 3 months      Relevant Medications   furosemide (LASIX) 20 MG tablet   lisinopril (ZESTRIL) 20 MG tablet   diltiazem (CARDIZEM CD) 240 MG 24 hr capsule   apixaban (ELIQUIS) 2.5 MG TABS tablet   atorvastatin (LIPITOR) 40 MG tablet   Depression, major, single episode, moderate (HCC) (Chronic)    Clinically active with worsening of symptoms,  No SI or HI. She has been out of Sertraline for an indeterminate amount of time. Will resume Sertraline        Relevant Medications   sertraline (ZOLOFT)  50 MG tablet   Calculus of gallbladder with chronic cholecystitis with obstruction    Now has a cholecystostomy tube and will likely be maintained with that She remains too frail for surgery at this time.      Atrial fibrillation with RVR (HCC)    Chronic afib with controlled rate. Continue Eliquis anticoagulation      Relevant Medications   furosemide (LASIX) 20 MG tablet   lisinopril (ZESTRIL) 20 MG tablet   diltiazem (CARDIZEM CD) 240 MG 24 hr capsule   apixaban (ELIQUIS) 2.5 MG TABS tablet   atorvastatin (LIPITOR) 40 MG tablet    Return in about 3 months (around 03/19/2023).    Reubin Milan, MD [facility] [place]

## 2022-12-17 NOTE — Assessment & Plan Note (Signed)
Chronic afib with controlled rate. Continue Eliquis anticoagulation

## 2022-12-17 NOTE — Assessment & Plan Note (Addendum)
Now has a cholecystostomy tube and will likely be maintained with that She remains too frail for surgery at this time.

## 2022-12-17 NOTE — Assessment & Plan Note (Addendum)
BP uncontrolled due to medication noncompliance Has need been seen here in some time - recently at Surgeon office and BP was very high. Will resume lisinopril, lasix and cardiazem. Follow up 3 months

## 2022-12-20 DIAGNOSIS — I2510 Atherosclerotic heart disease of native coronary artery without angina pectoris: Secondary | ICD-10-CM | POA: Diagnosis not present

## 2022-12-20 DIAGNOSIS — I130 Hypertensive heart and chronic kidney disease with heart failure and stage 1 through stage 4 chronic kidney disease, or unspecified chronic kidney disease: Secondary | ICD-10-CM | POA: Diagnosis not present

## 2022-12-20 DIAGNOSIS — Z48815 Encounter for surgical aftercare following surgery on the digestive system: Secondary | ICD-10-CM | POA: Diagnosis not present

## 2022-12-20 DIAGNOSIS — K8020 Calculus of gallbladder without cholecystitis without obstruction: Secondary | ICD-10-CM | POA: Diagnosis not present

## 2022-12-20 DIAGNOSIS — I5030 Unspecified diastolic (congestive) heart failure: Secondary | ICD-10-CM | POA: Diagnosis not present

## 2022-12-20 DIAGNOSIS — N1831 Chronic kidney disease, stage 3a: Secondary | ICD-10-CM | POA: Diagnosis not present

## 2022-12-22 DIAGNOSIS — I130 Hypertensive heart and chronic kidney disease with heart failure and stage 1 through stage 4 chronic kidney disease, or unspecified chronic kidney disease: Secondary | ICD-10-CM | POA: Diagnosis not present

## 2022-12-22 DIAGNOSIS — E785 Hyperlipidemia, unspecified: Secondary | ICD-10-CM | POA: Diagnosis not present

## 2022-12-22 DIAGNOSIS — E440 Moderate protein-calorie malnutrition: Secondary | ICD-10-CM | POA: Diagnosis not present

## 2022-12-22 DIAGNOSIS — K5900 Constipation, unspecified: Secondary | ICD-10-CM | POA: Diagnosis not present

## 2022-12-22 DIAGNOSIS — I700 Atherosclerosis of aorta: Secondary | ICD-10-CM | POA: Diagnosis not present

## 2022-12-22 DIAGNOSIS — N1831 Chronic kidney disease, stage 3a: Secondary | ICD-10-CM | POA: Diagnosis not present

## 2022-12-22 DIAGNOSIS — Z4803 Encounter for change or removal of drains: Secondary | ICD-10-CM | POA: Diagnosis not present

## 2022-12-22 DIAGNOSIS — K8020 Calculus of gallbladder without cholecystitis without obstruction: Secondary | ICD-10-CM | POA: Diagnosis not present

## 2022-12-22 DIAGNOSIS — Z48815 Encounter for surgical aftercare following surgery on the digestive system: Secondary | ICD-10-CM | POA: Diagnosis not present

## 2022-12-22 DIAGNOSIS — Z7901 Long term (current) use of anticoagulants: Secondary | ICD-10-CM | POA: Diagnosis not present

## 2022-12-22 DIAGNOSIS — I2510 Atherosclerotic heart disease of native coronary artery without angina pectoris: Secondary | ICD-10-CM | POA: Diagnosis not present

## 2022-12-22 DIAGNOSIS — I252 Old myocardial infarction: Secondary | ICD-10-CM | POA: Diagnosis not present

## 2022-12-22 DIAGNOSIS — K219 Gastro-esophageal reflux disease without esophagitis: Secondary | ICD-10-CM | POA: Diagnosis not present

## 2022-12-22 DIAGNOSIS — R1311 Dysphagia, oral phase: Secondary | ICD-10-CM | POA: Diagnosis not present

## 2022-12-22 DIAGNOSIS — Z86718 Personal history of other venous thrombosis and embolism: Secondary | ICD-10-CM | POA: Diagnosis not present

## 2022-12-22 DIAGNOSIS — I5030 Unspecified diastolic (congestive) heart failure: Secondary | ICD-10-CM | POA: Diagnosis not present

## 2022-12-22 DIAGNOSIS — Z87891 Personal history of nicotine dependence: Secondary | ICD-10-CM | POA: Diagnosis not present

## 2022-12-22 DIAGNOSIS — F32A Depression, unspecified: Secondary | ICD-10-CM | POA: Diagnosis not present

## 2022-12-23 DIAGNOSIS — K8020 Calculus of gallbladder without cholecystitis without obstruction: Secondary | ICD-10-CM | POA: Diagnosis not present

## 2022-12-23 DIAGNOSIS — I2510 Atherosclerotic heart disease of native coronary artery without angina pectoris: Secondary | ICD-10-CM | POA: Diagnosis not present

## 2022-12-23 DIAGNOSIS — Z48815 Encounter for surgical aftercare following surgery on the digestive system: Secondary | ICD-10-CM | POA: Diagnosis not present

## 2022-12-23 DIAGNOSIS — N1831 Chronic kidney disease, stage 3a: Secondary | ICD-10-CM | POA: Diagnosis not present

## 2022-12-23 DIAGNOSIS — I130 Hypertensive heart and chronic kidney disease with heart failure and stage 1 through stage 4 chronic kidney disease, or unspecified chronic kidney disease: Secondary | ICD-10-CM | POA: Diagnosis not present

## 2022-12-23 DIAGNOSIS — I5030 Unspecified diastolic (congestive) heart failure: Secondary | ICD-10-CM | POA: Diagnosis not present

## 2022-12-27 DIAGNOSIS — I5030 Unspecified diastolic (congestive) heart failure: Secondary | ICD-10-CM | POA: Diagnosis not present

## 2022-12-27 DIAGNOSIS — K8020 Calculus of gallbladder without cholecystitis without obstruction: Secondary | ICD-10-CM | POA: Diagnosis not present

## 2022-12-27 DIAGNOSIS — N1831 Chronic kidney disease, stage 3a: Secondary | ICD-10-CM | POA: Diagnosis not present

## 2022-12-27 DIAGNOSIS — Z48815 Encounter for surgical aftercare following surgery on the digestive system: Secondary | ICD-10-CM | POA: Diagnosis not present

## 2022-12-27 DIAGNOSIS — I2510 Atherosclerotic heart disease of native coronary artery without angina pectoris: Secondary | ICD-10-CM | POA: Diagnosis not present

## 2022-12-27 DIAGNOSIS — I130 Hypertensive heart and chronic kidney disease with heart failure and stage 1 through stage 4 chronic kidney disease, or unspecified chronic kidney disease: Secondary | ICD-10-CM | POA: Diagnosis not present

## 2022-12-28 DIAGNOSIS — Z48815 Encounter for surgical aftercare following surgery on the digestive system: Secondary | ICD-10-CM | POA: Diagnosis not present

## 2022-12-28 DIAGNOSIS — K8020 Calculus of gallbladder without cholecystitis without obstruction: Secondary | ICD-10-CM | POA: Diagnosis not present

## 2022-12-28 DIAGNOSIS — I2510 Atherosclerotic heart disease of native coronary artery without angina pectoris: Secondary | ICD-10-CM | POA: Diagnosis not present

## 2022-12-28 DIAGNOSIS — N1831 Chronic kidney disease, stage 3a: Secondary | ICD-10-CM | POA: Diagnosis not present

## 2022-12-28 DIAGNOSIS — I130 Hypertensive heart and chronic kidney disease with heart failure and stage 1 through stage 4 chronic kidney disease, or unspecified chronic kidney disease: Secondary | ICD-10-CM | POA: Diagnosis not present

## 2022-12-28 DIAGNOSIS — I5030 Unspecified diastolic (congestive) heart failure: Secondary | ICD-10-CM | POA: Diagnosis not present

## 2022-12-31 ENCOUNTER — Ambulatory Visit
Admission: RE | Admit: 2022-12-31 | Discharge: 2022-12-31 | Disposition: A | Discharge status: Home / Self Care | Payer: Medicare Other | Source: Ambulatory Visit | Attending: Surgery | Admitting: Surgery

## 2022-12-31 ENCOUNTER — Ambulatory Visit (INDEPENDENT_AMBULATORY_CARE_PROVIDER_SITE_OTHER): Payer: Medicare Other | Admitting: Surgery

## 2022-12-31 ENCOUNTER — Encounter: Payer: Self-pay | Admitting: Surgery

## 2022-12-31 ENCOUNTER — Other Ambulatory Visit: Payer: Self-pay | Admitting: Surgery

## 2022-12-31 VITALS — BP 165/74 | HR 90 | Temp 98.3°F | Ht 60.0 in | Wt 102.6 lb

## 2022-12-31 DIAGNOSIS — K810 Acute cholecystitis: Secondary | ICD-10-CM

## 2022-12-31 DIAGNOSIS — Z434 Encounter for attention to other artificial openings of digestive tract: Secondary | ICD-10-CM | POA: Diagnosis not present

## 2022-12-31 HISTORY — PX: IR CHOLANGIOGRAM EXISTING TUBE: IMG6040

## 2022-12-31 MED ORDER — AMOXICILLIN-POT CLAVULANATE 875-125 MG PO TABS: 1.0000 | ORAL_TABLET | Freq: Two times a day (BID) | ORAL | 0 refills | Status: AC

## 2022-12-31 MED ORDER — IOHEXOL 300 MG/ML  SOLN
10.0000 mL | Freq: Once | INTRAMUSCULAR | Status: AC | PRN
  Administered 2022-12-31: 10 mL

## 2022-12-31 NOTE — Patient Instructions (Addendum)
Call Dr Glennis Brink office to reschedule your follow up appointment with them. [phone].  You are scheduled for a Gallbladder drain check at [facility] today. Please arrive at the Heart & Vascular entrance at [facility] at 12:45 pm.   We are sending in a prescription for Augmentin. Please start this medication.  Follow up here in 3 weeks.

## 2022-12-31 NOTE — Progress Notes (Signed)
12/31/2022  History of Present Illness: Vanessa Farmer is a 87 y.o. female presenting for follow-up of acute cholecystitis.  The patient was last seen on 12/13/2022.  She had had a cholangiogram and drain exchange on 12/09/2022.  At the time of my last visit, her blood pressure was very high and she was sent back to her PCP for further management.  She saw Dr. Judithann Graves on 12/17/2022.  The patient was initially scheduled for a month follow-up but she called because this week she started noticing foul-smelling drainage coming from the gallbladder drain.  She denies any nausea or any worsening pain in the right upper quadrant.  She has been trying to eat more and try to gain some weight.  Denies any fevers or chills.  She reports that her blood pressure is doing better now.  Past Medical History: Past Medical History:  Diagnosis Date   Acute cholecystitis 10/11/2022   Anemia    Cataract    Depression    Fall    GERD (gastroesophageal reflux disease)    Hyperlipidemia    Hypertension    Myocardial infarction [facility])    "mild" many yrs ago     Past Surgical History: Past Surgical History:  Procedure Laterality Date   ABDOMINAL HYSTERECTOMY     CATARACT EXTRACTION, BILATERAL  2021   COLONOSCOPY WITH PROPOFOL N/A 12/05/2017   Procedure: COLONOSCOPY WITH PROPOFOL;  Surgeon: Midge Minium, MD;  Location: [facility];  Service: Endoscopy;  Laterality: N/A;   ESOPHAGOGASTRODUODENOSCOPY (EGD) WITH PROPOFOL N/A 12/05/2017   Procedure: ESOPHAGOGASTRODUODENOSCOPY (EGD) WITH PROPOFOL;  Surgeon: Midge Minium, MD;  Location: [facility];  Service: Endoscopy;  Laterality: N/A;   IR EXCHANGE BILIARY DRAIN  12/09/2022   IR PERC CHOLECYSTOSTOMY  10/12/2022   IR RADIOLOGIST EVAL & MGMT  12/09/2022   POLYPECTOMY  12/05/2017   Procedure: POLYPECTOMY;  Surgeon: Midge Minium, MD;  Location: [facility];  Service: Endoscopy;;   TONSILLECTOMY      Home Medications: Prior to Admission medications    Medication Sig Start Date End Date Taking? Authorizing Provider  amoxicillin-clavulanate (AUGMENTIN) 875-125 MG tablet Take 1 tablet by mouth 2 (two) times daily for 7 days. 12/31/22 01/07/23 Yes Silvano Garofano, Elita Quick, MD  apixaban (ELIQUIS) 2.5 MG TABS tablet Take 1 tablet (2.5 mg total) by mouth 2 (two) times daily. 12/17/22  Yes Reubin Milan, MD  atorvastatin (LIPITOR) 40 MG tablet Take 1 tablet (40 mg total) by mouth daily. 12/17/22  Yes Reubin Milan, MD  diltiazem (CARDIZEM CD) 240 MG 24 hr capsule Take 1 capsule (240 mg total) by mouth daily. 12/17/22  Yes Reubin Milan, MD  furosemide (LASIX) 20 MG tablet Take 1 tablet (20 mg total) by mouth daily. 12/17/22  Yes Reubin Milan, MD  lisinopril (ZESTRIL) 20 MG tablet Take 1 tablet (20 mg total) by mouth daily. 12/17/22  Yes Reubin Milan, MD  Multiple Vitamins-Calcium (ONE-A-DAY WOMENS PO) Take 1 tablet by mouth daily.   Yes [provider]  oxyCODONE-acetaminophen (PERCOCET) 5-325 MG tablet Take 1 tablet by mouth every 4 (four) hours as needed for severe pain. 11/12/22 11/12/23 Yes Willy Eddy, MD  pantoprazole (PROTONIX) 40 MG tablet Take 1 tablet (40 mg total) by mouth daily. 12/17/22  Yes Reubin Milan, MD  polyethylene glycol (MIRALAX / GLYCOLAX) 17 g packet Take 17 g by mouth daily. 10/16/22  Yes Delfino Lovett, MD  promethazine (PHENERGAN) 12.5 MG tablet Take by mouth. 10/22/22  Yes [provider]  senna-docusate (SENOKOT-S) 8.6-50 MG tablet Take 1 tablet by mouth at bedtime as needed for mild constipation. 10/15/22  Yes Delfino Lovett, MD  sertraline (ZOLOFT) 50 MG tablet Take 1 tablet (50 mg total) by mouth daily. 12/17/22  Yes Reubin Milan, MD  traMADol (ULTRAM) 50 MG tablet Take 50 mg by mouth 2 (two) times daily.   Yes [provider]  Wound Dressings (MEDIHONEY WOUND/BURN DRESSING) GEL Apply topically. 10/19/22  Yes [provider]  ENSURE (ENSURE) Take 237 mLs by mouth 2 (two) times daily between  meals. 10/20/17 10/17/20  Reubin Milan, MD    Allergies: No Known Allergies  Review of Systems: Review of Systems  Constitutional:  Negative for chills and fever.  Respiratory:  Negative for shortness of breath.   Cardiovascular:  Negative for chest pain.  Gastrointestinal:  Negative for abdominal pain, nausea and vomiting.       Foul-smelling drainage from gallbladder drain    Physical Exam BP (!) 165/74   Pulse 90   Temp 98.3 F (36.8 C)   Ht 5' (1.524 m)   Wt 102 lb 9.6 oz (46.5 kg)   SpO2 97%   BMI 20.04 kg/m  CONSTITUTIONAL: No acute distress HEENT:  Normocephalic, atraumatic, extraocular motion intact. RESPIRATORY:  Normal respiratory effort without pathologic use of accessory muscles. CARDIOVASCULAR: Improved blood pressure today.  Regular rhythm and rate. GI: The abdomen is soft, nondistended, nontender to palpation.  Right upper quadrant percutaneous cholecystostomy drain in place and secured with clips dressing.  The tubing and bag has what appears to be purulent drainage.  The drain insertion site itself looks clean without any evidence of external infection.  NEUROLOGIC:  Motor and sensation is grossly normal.  Cranial nerves are grossly intact. PSYCH:  Alert and oriented to person, place and time. Affect is normal.  Assessment and Plan: This is a 87 y.o. female with acute cholecystitis history, status post percutaneous cholecystostomy drain placement.  - Discussed with patient that given the change and drainage coming from the gallbladder drain, it would be prudent to have her get evaluated by interventional radiology with a drain study to see if there is any potential complication going on from her gallbladder.  Order has been placed and appointment has been scheduled for later today. - Will also start the patient empirically on antibiotics and will start her on Augmentin twice daily for 10 days. - Patient will follow-up with me in 3 weeks to reassess how  everything is going make sure that things are improving. - The patient today appears in better spirits and health compared to the last time I saw her just 3 weeks ago.  My hope would be that over the next month or so she will continue to improve to be able to schedule her for surgery.  I spent 30 minutes dedicated to the care of this patient on the date of this encounter to include pre-visit review of records, face-to-face time with the patient discussing diagnosis and management, and any post-visit coordination of care.   Howie Ill, MD [facility]

## 2023-01-04 DIAGNOSIS — Z48815 Encounter for surgical aftercare following surgery on the digestive system: Secondary | ICD-10-CM | POA: Diagnosis not present

## 2023-01-04 DIAGNOSIS — N1831 Chronic kidney disease, stage 3a: Secondary | ICD-10-CM | POA: Diagnosis not present

## 2023-01-04 DIAGNOSIS — I2510 Atherosclerotic heart disease of native coronary artery without angina pectoris: Secondary | ICD-10-CM | POA: Diagnosis not present

## 2023-01-04 DIAGNOSIS — I130 Hypertensive heart and chronic kidney disease with heart failure and stage 1 through stage 4 chronic kidney disease, or unspecified chronic kidney disease: Secondary | ICD-10-CM | POA: Diagnosis not present

## 2023-01-04 DIAGNOSIS — I5030 Unspecified diastolic (congestive) heart failure: Secondary | ICD-10-CM | POA: Diagnosis not present

## 2023-01-04 DIAGNOSIS — K8020 Calculus of gallbladder without cholecystitis without obstruction: Secondary | ICD-10-CM | POA: Diagnosis not present

## 2023-01-17 ENCOUNTER — Ambulatory Visit: Payer: Medicare Other | Admitting: Surgery

## 2023-01-18 DIAGNOSIS — N1831 Chronic kidney disease, stage 3a: Secondary | ICD-10-CM | POA: Diagnosis not present

## 2023-01-18 DIAGNOSIS — I130 Hypertensive heart and chronic kidney disease with heart failure and stage 1 through stage 4 chronic kidney disease, or unspecified chronic kidney disease: Secondary | ICD-10-CM | POA: Diagnosis not present

## 2023-01-18 DIAGNOSIS — K8020 Calculus of gallbladder without cholecystitis without obstruction: Secondary | ICD-10-CM | POA: Diagnosis not present

## 2023-01-18 DIAGNOSIS — I2510 Atherosclerotic heart disease of native coronary artery without angina pectoris: Secondary | ICD-10-CM | POA: Diagnosis not present

## 2023-01-18 DIAGNOSIS — Z48815 Encounter for surgical aftercare following surgery on the digestive system: Secondary | ICD-10-CM | POA: Diagnosis not present

## 2023-01-18 DIAGNOSIS — I5030 Unspecified diastolic (congestive) heart failure: Secondary | ICD-10-CM | POA: Diagnosis not present

## 2023-01-19 ENCOUNTER — Other Ambulatory Visit: Payer: Self-pay

## 2023-01-19 ENCOUNTER — Emergency Department: Payer: Medicare Other

## 2023-01-19 ENCOUNTER — Emergency Department
Admission: EM | Admit: 2023-01-19 | Discharge: 2023-01-19 | Disposition: A | Discharge status: Home / Self Care | Payer: Medicare Other | Attending: Emergency Medicine | Admitting: Emergency Medicine

## 2023-01-19 DIAGNOSIS — I5030 Unspecified diastolic (congestive) heart failure: Secondary | ICD-10-CM | POA: Diagnosis not present

## 2023-01-19 DIAGNOSIS — K9189 Other postprocedural complications and disorders of digestive system: Secondary | ICD-10-CM | POA: Diagnosis not present

## 2023-01-19 DIAGNOSIS — Z9689 Presence of other specified functional implants: Secondary | ICD-10-CM | POA: Diagnosis not present

## 2023-01-19 DIAGNOSIS — N1831 Chronic kidney disease, stage 3a: Secondary | ICD-10-CM | POA: Diagnosis not present

## 2023-01-19 DIAGNOSIS — I2510 Atherosclerotic heart disease of native coronary artery without angina pectoris: Secondary | ICD-10-CM | POA: Diagnosis not present

## 2023-01-19 DIAGNOSIS — I130 Hypertensive heart and chronic kidney disease with heart failure and stage 1 through stage 4 chronic kidney disease, or unspecified chronic kidney disease: Secondary | ICD-10-CM | POA: Diagnosis not present

## 2023-01-19 DIAGNOSIS — Z48815 Encounter for surgical aftercare following surgery on the digestive system: Secondary | ICD-10-CM | POA: Diagnosis not present

## 2023-01-19 DIAGNOSIS — K8020 Calculus of gallbladder without cholecystitis without obstruction: Secondary | ICD-10-CM | POA: Diagnosis not present

## 2023-01-19 LAB — CBC WITH DIFFERENTIAL/PLATELET
Abs Immature Granulocytes: 0 10*3/uL (ref 0.00–0.07)
Basophils Absolute: 0 10*3/uL (ref 0.0–0.1)
Basophils Relative: 0 %
Eosinophils Absolute: 0.1 10*3/uL (ref 0.0–0.5)
Eosinophils Relative: 4 %
HCT: 37.9 % (ref 36.0–46.0)
Hemoglobin: 12.4 g/dL (ref 12.0–15.0)
Immature Granulocytes: 0 %
Lymphocytes Relative: 26 %
Lymphs Abs: 1 10*3/uL (ref 0.7–4.0)
MCH: 32 pg (ref 26.0–34.0)
MCHC: 32.7 g/dL (ref 30.0–36.0)
MCV: 97.9 fL (ref 80.0–100.0)
Monocytes Absolute: 0.3 10*3/uL (ref 0.1–1.0)
Monocytes Relative: 9 %
Neutro Abs: 2.5 10*3/uL (ref 1.7–7.7)
Neutrophils Relative %: 61 %
Platelets: 199 10*3/uL (ref 150–400)
RBC: 3.87 MIL/uL (ref 3.87–5.11)
RDW: 13.7 % (ref 11.5–15.5)
WBC: 4 10*3/uL (ref 4.0–10.5)
nRBC: 0 % (ref 0.0–0.2)

## 2023-01-19 LAB — COMPREHENSIVE METABOLIC PANEL
ALT: 17 U/L (ref 0–44)
AST: 21 U/L (ref 15–41)
Albumin: 3.7 g/dL (ref 3.5–5.0)
Alkaline Phosphatase: 76 U/L (ref 38–126)
Anion gap: 11 (ref 5–15)
BUN: 30 mg/dL — ABNORMAL HIGH (ref 8–23)
CO2: 26 mmol/L (ref 22–32)
Calcium: 8.9 mg/dL (ref 8.9–10.3)
Chloride: 99 mmol/L (ref 98–111)
Creatinine, Ser: 1.1 mg/dL — ABNORMAL HIGH (ref 0.44–1.00)
GFR, Estimated: 49 mL/min — ABNORMAL LOW (ref >60)
Glucose, Bld: 126 mg/dL — ABNORMAL HIGH (ref 70–99)
Potassium: 4 mmol/L (ref 3.5–5.1)
Sodium: 136 mmol/L (ref 135–145)
Total Bilirubin: 0.6 mg/dL (ref 0.3–1.2)
Total Protein: 7 g/dL (ref 6.5–8.1)

## 2023-01-19 MED ORDER — MORPHINE SULFATE (PF) 4 MG/ML IV SOLN
4.0000 mg | Freq: Once | INTRAVENOUS | Status: AC
  Administered 2023-01-19: 4 mg via INTRAVENOUS
  Filled 2023-01-19: qty 1

## 2023-01-19 NOTE — ED Provider Notes (Signed)
[facility] Provider Note    Event Date/Time   First MD Initiated Contact with Patient 01/19/23 1630     (approximate)   History   Post-op Problem   HPI Vanessa Farmer is a 87 y.o. female with cholecystitis in June with subsequent drain placed that she was not a candidate for cholecystectomy presenting today for postop problem.  Patient was getting care of her gallbladder drain today when it was pulled slightly out as well as breaking a portion of the drain.  Spoke with her general surgery team who recommended she come to the emergency department for further evaluation.  Drain was most recently replaced in the beginning of August by interventional radiology.  Patient has noted burning pain at the site since it was placed in early August.  Otherwise denying abdominal pain, nausea, or fevers.  Patient most recently seen by her surgery team on 12/31/2022 with concern for possible complication of the drain.  Empirically started on Augmentin.  Plan for reassessment in 3 weeks.     Physical Exam   Triage Vital Signs: ED Triage Vitals  Encounter Vitals Group     BP 01/19/23 1543 (!) 154/94     Systolic BP Percentile --      Diastolic BP Percentile --      Pulse Rate 01/19/23 1543 99     Resp 01/19/23 1543 16     Temp 01/19/23 1543 98.3 F (36.8 C)     Temp Source 01/19/23 1543 Oral     SpO2 01/19/23 1543 95 %     Weight 01/19/23 1548 101 lb 6.6 oz (46 kg)     Height 01/19/23 1548 5' (1.524 m)     Head Circumference --      Peak Flow --      Pain Score 01/19/23 1548 0     Pain Loc --      Pain Education --      Exclude from Growth Chart --     Most recent vital signs: Vitals:   01/19/23 1543 01/19/23 1710  BP: (!) 154/94 (!) 128/57  Pulse: 99 90  Resp: 16 (!) 30  Temp: 98.3 F (36.8 C)   SpO2: 95% 95%   I have reviewed the vital signs. General:  Awake, alert, no acute distress. Head:  Normocephalic, Atraumatic. EENT:  PERRL, EOMI, Oral mucosa  pink and moist, Neck is supple. Cardiovascular: Regular rate, 2+ distal pulses. Respiratory:  Normal respiratory effort, symmetrical expansion, no distress.   Abdomen: Drain in right upper quadrant leading to her gallbladder.  Stitches securing it down and are currently out.  Does appear dislodged slightly by couple centimeters.  Piece that connects to her drainage bag is broken off and no longer functional.  No significant erythema or other discharge at the site of the insertion.  No erythema surrounding the wound.  No tenderness palpation throughout the abdomen Extremities:  Moving all four extremities through full ROM without pain.   Neuro:  Alert and oriented.  Interacting appropriately.   Skin:  Warm, dry, no rash.   Psych: Appropriate affect.     ED Results / Procedures / Treatments   Labs (all labs ordered are listed, but only abnormal results are displayed) Labs Reviewed  COMPREHENSIVE METABOLIC PANEL - Abnormal; Notable for the following components:      Result Value   Glucose, Bld 126 (*)    BUN 30 (*)    Creatinine, Ser 1.10 (*)  GFR, Estimated 49 (*)    All other components within normal limits  CBC WITH DIFFERENTIAL/PLATELET     EKG    RADIOLOGY Abdominal x-ray shows appropriate placement of gallbladder drain.   PROCEDURES:  Critical Care performed: No  Procedures   MEDICATIONS ORDERED IN ED: Medications  morphine (PF) 4 MG/ML injection 4 mg (4 mg Intravenous Given 01/19/23 1727)     IMPRESSION / MDM / ASSESSMENT AND PLAN / ED COURSE  I reviewed the triage vital signs and the nursing notes.                              Differential diagnosis includes, but is not limited to, gallbladder drain dislodgment versus tube damage.  Patient's presentation is most consistent with exacerbation of chronic illness.  Patient is an 87 year old female presenting today for possible dislodgment of gallbladder drain along with breakage of one of the parts.  The  break appears at the collection bag and not the drain itself which will be replaced today.  Spoke with Dr. Aleen Campi with general surgery recommending x-ray to view positioning site.  He viewed x-ray and does feel it is in appropriate position at this time.  Resutured drain back in place here today.  Laboratory workup otherwise reassuring with no evidence of infection at the site.  Patient safe for discharge at this time and follow-up with Dr. Aleen Campi in 2 days for reevaluation.  The patient is on the cardiac monitor to evaluate for evidence of arrhythmia and/or significant heart rate changes. Clinical Course as of 01/19/23 1815  Wed Jan 19, 2023  1704 Surgery - XR of abdomen to see location of drain. Then may need CT (without contrast) afterwards. Can replaced clear cap at the end with bag. IR would have bag for this or OR [DW]  1735 CBC with Differential Unremarkable [DW]  1735 Spoke with Dr. Aleen Campi who reviewed abdominal x-ray.  Feels that it is still in the same spot with no dislodgment.  Recommends exchange of collection bag and throwing 1 stitch to suture it back down and patient can be discharged with outpatient follow-up. [DW]  1744 Comprehensive metabolic panel(!) Comparable with baseline [DW]    Clinical Course User Index [DW] Janith Lima, MD     FINAL CLINICAL IMPRESSION(S) / ED DIAGNOSES   Final diagnoses:  Postoperative surgical complication involving digestive system associated with digestive system procedure, unspecified complication     Rx / DC Orders   ED Discharge Orders     None        Note:  This document was prepared using Dragon voice recognition software and may include unintentional dictation errors.   Janith Lima, MD 01/19/23 1816

## 2023-01-19 NOTE — ED Triage Notes (Signed)
Pt to ED for problems with drain that goes to gallbladder, states drain broke off. Denies other complaints

## 2023-01-19 NOTE — Discharge Instructions (Addendum)
You were seen in the emergency department today for your gallbladder drain.  It does appear to be in the correct spot and I replaced suture.  Please follow-up with your surgery team as planned for reevaluation.

## 2023-01-19 NOTE — ED Notes (Signed)
Pt states she's a 6/10 pain from her biliary drain surgery she had. States it burns more than anything.

## 2023-01-21 ENCOUNTER — Ambulatory Visit
Admission: RE | Admit: 2023-01-21 | Discharge: 2023-01-21 | Disposition: A | Discharge status: Home / Self Care | Payer: Medicare Other | Source: Ambulatory Visit | Attending: Surgery | Admitting: Surgery

## 2023-01-21 ENCOUNTER — Other Ambulatory Visit: Payer: Self-pay | Admitting: Interventional Radiology

## 2023-01-21 ENCOUNTER — Ambulatory Visit (INDEPENDENT_AMBULATORY_CARE_PROVIDER_SITE_OTHER): Payer: Medicare Other | Admitting: Surgery

## 2023-01-21 ENCOUNTER — Other Ambulatory Visit: Payer: Self-pay | Admitting: Surgery

## 2023-01-21 ENCOUNTER — Encounter: Payer: Self-pay | Admitting: Surgery

## 2023-01-21 VITALS — BP 174/73 | HR 84 | Temp 98.0°F | Ht 60.0 in | Wt 104.4 lb

## 2023-01-21 DIAGNOSIS — K219 Gastro-esophageal reflux disease without esophagitis: Secondary | ICD-10-CM | POA: Diagnosis not present

## 2023-01-21 DIAGNOSIS — I2510 Atherosclerotic heart disease of native coronary artery without angina pectoris: Secondary | ICD-10-CM | POA: Diagnosis not present

## 2023-01-21 DIAGNOSIS — Z87891 Personal history of nicotine dependence: Secondary | ICD-10-CM | POA: Diagnosis not present

## 2023-01-21 DIAGNOSIS — T85590A Other mechanical complication of bile duct prosthesis, initial encounter: Secondary | ICD-10-CM | POA: Diagnosis not present

## 2023-01-21 DIAGNOSIS — K819 Cholecystitis, unspecified: Secondary | ICD-10-CM

## 2023-01-21 DIAGNOSIS — Z48815 Encounter for surgical aftercare following surgery on the digestive system: Secondary | ICD-10-CM | POA: Diagnosis not present

## 2023-01-21 DIAGNOSIS — K810 Acute cholecystitis: Secondary | ICD-10-CM

## 2023-01-21 DIAGNOSIS — I252 Old myocardial infarction: Secondary | ICD-10-CM | POA: Diagnosis not present

## 2023-01-21 DIAGNOSIS — R1311 Dysphagia, oral phase: Secondary | ICD-10-CM | POA: Diagnosis not present

## 2023-01-21 DIAGNOSIS — F32A Depression, unspecified: Secondary | ICD-10-CM | POA: Diagnosis not present

## 2023-01-21 DIAGNOSIS — E785 Hyperlipidemia, unspecified: Secondary | ICD-10-CM | POA: Diagnosis not present

## 2023-01-21 DIAGNOSIS — Z4803 Encounter for change or removal of drains: Secondary | ICD-10-CM | POA: Diagnosis not present

## 2023-01-21 DIAGNOSIS — T85518A Breakdown (mechanical) of other gastrointestinal prosthetic devices, implants and grafts, initial encounter: Secondary | ICD-10-CM

## 2023-01-21 DIAGNOSIS — E440 Moderate protein-calorie malnutrition: Secondary | ICD-10-CM | POA: Diagnosis not present

## 2023-01-21 DIAGNOSIS — I130 Hypertensive heart and chronic kidney disease with heart failure and stage 1 through stage 4 chronic kidney disease, or unspecified chronic kidney disease: Secondary | ICD-10-CM | POA: Diagnosis not present

## 2023-01-21 DIAGNOSIS — N1831 Chronic kidney disease, stage 3a: Secondary | ICD-10-CM | POA: Diagnosis not present

## 2023-01-21 DIAGNOSIS — Z7901 Long term (current) use of anticoagulants: Secondary | ICD-10-CM | POA: Diagnosis not present

## 2023-01-21 DIAGNOSIS — Z86718 Personal history of other venous thrombosis and embolism: Secondary | ICD-10-CM | POA: Diagnosis not present

## 2023-01-21 DIAGNOSIS — I700 Atherosclerosis of aorta: Secondary | ICD-10-CM | POA: Diagnosis not present

## 2023-01-21 DIAGNOSIS — I5030 Unspecified diastolic (congestive) heart failure: Secondary | ICD-10-CM | POA: Diagnosis not present

## 2023-01-21 DIAGNOSIS — K8020 Calculus of gallbladder without cholecystitis without obstruction: Secondary | ICD-10-CM | POA: Diagnosis not present

## 2023-01-21 DIAGNOSIS — K5900 Constipation, unspecified: Secondary | ICD-10-CM | POA: Diagnosis not present

## 2023-01-21 DIAGNOSIS — K828 Other specified diseases of gallbladder: Secondary | ICD-10-CM | POA: Diagnosis not present

## 2023-01-21 HISTORY — PX: IR EXCHANGE BILIARY DRAIN: IMG6046

## 2023-01-21 MED ORDER — LIDOCAINE HCL 1 % IJ SOLN
INTRAMUSCULAR | Status: AC
  Filled 2023-01-21: qty 20

## 2023-01-21 MED ORDER — IOHEXOL 300 MG/ML  SOLN
10.0000 mL | Freq: Once | INTRAMUSCULAR | Status: AC | PRN
  Administered 2023-01-21: 10 mL

## 2023-01-21 MED ORDER — GABAPENTIN 100 MG PO CAPS: 100.0000 mg | ORAL_CAPSULE | Freq: Three times a day (TID) | ORAL | 0 refills | Status: DC

## 2023-01-21 MED ORDER — LIDOCAINE HCL 1 % IJ SOLN: 10.0000 mL | Freq: Once | INTRAMUSCULAR | Status: DC

## 2023-01-21 NOTE — Patient Instructions (Addendum)
Call Dr Glennis Brink office to reschedule your follow up appointment with them. [phone].  It is important that you follow up with Cardiology.    You are scheduled with Interventional Radiology at [facility] today. You will need to go in through the new [facility] entrance of the hospital at 12:30 pm.    Follow up here in 3 weeks.

## 2023-01-21 NOTE — Progress Notes (Signed)
01/21/2023  History of Present Illness: Vanessa Farmer is a 87 y.o. female presenting for follow-up of cholecystitis status post percutaneous drain placement.  Patient presented to emergency room on 01/19/2023 because the tubing connecting the back to the drain itself had broken.  In the emergency room, KUB showed drain to be in appropriate position and a new drainage bag was reconnected.  Patient was discharged home.  Today, the patient reports that she has been experiencing significant burning sensation at the drain insertion site has been mostly ongoing since the drain was exchanged on 12/09/2022.  Patient reports that the pain is very significant and keeps her up at night.  Denies any nausea or vomiting.  She has been eating much better.  She has not seen cardiology yet for follow-up for clearance for surgery.  Past Medical History: Past Medical History:  Diagnosis Date   Acute cholecystitis 10/11/2022   Anemia    Cataract    Depression    Fall    GERD (gastroesophageal reflux disease)    Hyperlipidemia    Hypertension    Myocardial infarction [facility])    "mild" many yrs ago     Past Surgical History: Past Surgical History:  Procedure Laterality Date   ABDOMINAL HYSTERECTOMY     CATARACT EXTRACTION, BILATERAL  2021   COLONOSCOPY WITH PROPOFOL N/A 12/05/2017   Procedure: COLONOSCOPY WITH PROPOFOL;  Surgeon: Midge Minium, MD;  Location: [facility];  Service: Endoscopy;  Laterality: N/A;   ESOPHAGOGASTRODUODENOSCOPY (EGD) WITH PROPOFOL N/A 12/05/2017   Procedure: ESOPHAGOGASTRODUODENOSCOPY (EGD) WITH PROPOFOL;  Surgeon: Midge Minium, MD;  Location: [facility];  Service: Endoscopy;  Laterality: N/A;   IR CHOLANGIOGRAM EXISTING TUBE  12/31/2022   IR EXCHANGE BILIARY DRAIN  12/09/2022   IR PERC CHOLECYSTOSTOMY  10/12/2022   IR RADIOLOGIST EVAL & MGMT  12/09/2022   POLYPECTOMY  12/05/2017   Procedure: POLYPECTOMY;  Surgeon: Midge Minium, MD;  Location: [facility];  Service:  Endoscopy;;   TONSILLECTOMY      Home Medications: Prior to Admission medications   Medication Sig Start Date End Date Taking? Authorizing Provider  apixaban (ELIQUIS) 2.5 MG TABS tablet Take 1 tablet (2.5 mg total) by mouth 2 (two) times daily. 12/17/22  Yes Reubin Milan, MD  atorvastatin (LIPITOR) 40 MG tablet Take 1 tablet (40 mg total) by mouth daily. 12/17/22  Yes Reubin Milan, MD  diltiazem (CARDIZEM CD) 240 MG 24 hr capsule Take 1 capsule (240 mg total) by mouth daily. 12/17/22  Yes Reubin Milan, MD  furosemide (LASIX) 20 MG tablet Take 1 tablet (20 mg total) by mouth daily. 12/17/22  Yes Reubin Milan, MD  gabapentin (NEURONTIN) 100 MG capsule Take 1 capsule (100 mg total) by mouth 3 (three) times daily. 01/21/23  Yes Kathleena Freeman, Elita Quick, MD  lisinopril (ZESTRIL) 20 MG tablet Take 1 tablet (20 mg total) by mouth daily. 12/17/22  Yes Reubin Milan, MD  Multiple Vitamins-Calcium (ONE-A-DAY WOMENS PO) Take 1 tablet by mouth daily.   Yes [provider]  oxyCODONE-acetaminophen (PERCOCET) 5-325 MG tablet Take 1 tablet by mouth every 4 (four) hours as needed for severe pain. 11/12/22 11/12/23 Yes Willy Eddy, MD  pantoprazole (PROTONIX) 40 MG tablet Take 1 tablet (40 mg total) by mouth daily. 12/17/22  Yes Reubin Milan, MD  polyethylene glycol (MIRALAX / GLYCOLAX) 17 g packet Take 17 g by mouth daily. 10/16/22  Yes Delfino Lovett, MD  promethazine (PHENERGAN) 12.5 MG tablet Take by  mouth. 10/22/22  Yes [provider]  senna-docusate (SENOKOT-S) 8.6-50 MG tablet Take 1 tablet by mouth at bedtime as needed for mild constipation. 10/15/22  Yes Delfino Lovett, MD  sertraline (ZOLOFT) 50 MG tablet Take 1 tablet (50 mg total) by mouth daily. 12/17/22  Yes Reubin Milan, MD  traMADol (ULTRAM) 50 MG tablet Take 50 mg by mouth 2 (two) times daily.   Yes [provider]  Wound Dressings (MEDIHONEY WOUND/BURN DRESSING) GEL Apply topically. 10/19/22  Yes [provider]  ENSURE (ENSURE) Take 237 mLs by mouth 2 (two) times daily between meals. 10/20/17 10/17/20  Reubin Milan, MD    Allergies: No Known Allergies  Review of Systems: Review of Systems  Constitutional:  Negative for chills and fever.  Respiratory:  Negative for shortness of breath.   Cardiovascular:  Negative for chest pain.  Gastrointestinal:  Positive for abdominal pain. Negative for nausea and vomiting.    Physical Exam BP (!) 174/73   Pulse 84   Temp 98 F (36.7 C)   Ht 5' (1.524 m)   Wt 104 lb 6.4 oz (47.4 kg)   SpO2 97%   BMI 20.39 kg/m  CONSTITUTIONAL: Patient appears to be in mild pain distress HEENT:  Normocephalic, atraumatic, extraocular motion intact. RESPIRATORY:  Lungs are clear, and breath sounds are equal bilaterally. Normal respiratory effort without pathologic use of accessory muscles. CARDIOVASCULAR: Heart is regular without murmurs, gallops, or rubs. GI: The abdomen is soft, nondistended, with localized tenderness to palpation at the drain insertion site.  The patient reports constant significant burning sensation in that area.  Drain is appropriately connected to a new drainage bag which has some purulent fluid.  No evidence of external infection at the drain insertion site.   NEUROLOGIC:  Motor and sensation is grossly normal.  Cranial nerves are grossly intact. PSYCH:  Alert and oriented to person, place and time. Affect is normal.  Labs/Imaging: KUB on 01/19/23: IMPRESSION: 1. Catheter overlies the right upper quadrant. 2. Nonobstructive bowel gas pattern.  Assessment and Plan: This is a 87 y.o. female with acute close status status post percutaneous cholecystostomy drain placement.  - The patient reports significant burning sensation at the drain insertion site.  Discussed with her the potential this could be the drain irritating any of the cutaneous nerves in that area.  Discussed with her also that since the drain suture has been off at the time  of her emergency room visit, I will want to make sure that the drain is working well and is in appropriate position.  At the time of her visit to the emergency room, IR services were not available and so we will set her up for a new drain study and potential exchange and see if this can help with the burning sensation.  I will also prescribe her gabapentin low-dose to see if this can help with some of the nerve type of pain.  Patient is in agreement with this plan. - Patient will follow-up with me in about 3 weeks.  The patient's daughter will contact cardiology today to set up a follow-up appointment.  Hopefully if she is cleared for surgery, then we will be able to schedule her next time I see her.  I spent 20 minutes dedicated to the care of this patient on the date of this encounter to include pre-visit review of records, face-to-face time with the patient discussing diagnosis and management, and any post-visit coordination of care.   [employer]  Maximino Greenland, MD [facility]

## 2023-01-23 DIAGNOSIS — I130 Hypertensive heart and chronic kidney disease with heart failure and stage 1 through stage 4 chronic kidney disease, or unspecified chronic kidney disease: Secondary | ICD-10-CM | POA: Diagnosis not present

## 2023-01-23 DIAGNOSIS — Z4803 Encounter for change or removal of drains: Secondary | ICD-10-CM | POA: Diagnosis not present

## 2023-01-23 DIAGNOSIS — Z48815 Encounter for surgical aftercare following surgery on the digestive system: Secondary | ICD-10-CM | POA: Diagnosis not present

## 2023-01-23 DIAGNOSIS — I5030 Unspecified diastolic (congestive) heart failure: Secondary | ICD-10-CM | POA: Diagnosis not present

## 2023-01-23 DIAGNOSIS — K8020 Calculus of gallbladder without cholecystitis without obstruction: Secondary | ICD-10-CM | POA: Diagnosis not present

## 2023-01-23 DIAGNOSIS — N1831 Chronic kidney disease, stage 3a: Secondary | ICD-10-CM | POA: Diagnosis not present

## 2023-01-24 ENCOUNTER — Emergency Department
Admission: EM | Admit: 2023-01-24 | Discharge: 2023-01-24 | Discharge status: Against Medical Advice | Payer: Medicare Other | Attending: Emergency Medicine | Admitting: Emergency Medicine

## 2023-01-24 ENCOUNTER — Other Ambulatory Visit: Payer: Self-pay

## 2023-01-24 ENCOUNTER — Encounter: Payer: Self-pay | Admitting: *Deleted

## 2023-01-24 ENCOUNTER — Other Ambulatory Visit: Payer: Self-pay | Admitting: Internal Medicine

## 2023-01-24 DIAGNOSIS — T8189XA Other complications of procedures, not elsewhere classified, initial encounter: Secondary | ICD-10-CM | POA: Insufficient documentation

## 2023-01-24 DIAGNOSIS — I10 Essential (primary) hypertension: Secondary | ICD-10-CM | POA: Diagnosis not present

## 2023-01-24 DIAGNOSIS — R519 Headache, unspecified: Secondary | ICD-10-CM | POA: Insufficient documentation

## 2023-01-24 DIAGNOSIS — R1084 Generalized abdominal pain: Secondary | ICD-10-CM | POA: Diagnosis not present

## 2023-01-24 DIAGNOSIS — Z5321 Procedure and treatment not carried out due to patient leaving prior to being seen by health care provider: Secondary | ICD-10-CM | POA: Diagnosis not present

## 2023-01-24 DIAGNOSIS — Y732 Prosthetic and other implants, materials and accessory gastroenterology and urology devices associated with adverse incidents: Secondary | ICD-10-CM | POA: Diagnosis not present

## 2023-01-24 LAB — BASIC METABOLIC PANEL
Anion gap: 11 (ref 5–15)
BUN: 25 mg/dL — ABNORMAL HIGH (ref 8–23)
CO2: 28 mmol/L (ref 22–32)
Calcium: 9.2 mg/dL (ref 8.9–10.3)
Chloride: 98 mmol/L (ref 98–111)
Creatinine, Ser: 0.94 mg/dL (ref 0.44–1.00)
GFR, Estimated: 59 mL/min — ABNORMAL LOW (ref >60)
Glucose, Bld: 113 mg/dL — ABNORMAL HIGH (ref 70–99)
Potassium: 4.2 mmol/L (ref 3.5–5.1)
Sodium: 137 mmol/L (ref 135–145)

## 2023-01-24 LAB — CBC
HCT: 37.7 % (ref 36.0–46.0)
Hemoglobin: 12.3 g/dL (ref 12.0–15.0)
MCH: 32.1 pg (ref 26.0–34.0)
MCHC: 32.6 g/dL (ref 30.0–36.0)
MCV: 98.4 fL (ref 80.0–100.0)
Platelets: 187 10*3/uL (ref 150–400)
RBC: 3.83 MIL/uL — ABNORMAL LOW (ref 3.87–5.11)
RDW: 13.6 % (ref 11.5–15.5)
WBC: 6 10*3/uL (ref 4.0–10.5)
nRBC: 0 % (ref 0.0–0.2)

## 2023-01-24 NOTE — ED Triage Notes (Signed)
Pt is here due to pain from her gallbladder drain.  Drain has been in place for a long time but burning and draining around drain began today.  Denies trauma

## 2023-01-24 NOTE — ED Triage Notes (Signed)
First Nurse Note: Patient to ED via [facility] from home. Patient having drainage around gallbladder drain and burning sensation. VS WNL. Also has headache

## 2023-01-28 ENCOUNTER — Inpatient Hospital Stay: Admission: RE | Admit: 2023-01-28 | Payer: Medicare Other | Source: Ambulatory Visit | Admitting: Radiology

## 2023-02-01 ENCOUNTER — Ambulatory Visit: Payer: Medicare Other

## 2023-02-01 DIAGNOSIS — Z0000 Encounter for general adult medical examination without abnormal findings: Secondary | ICD-10-CM | POA: Diagnosis not present

## 2023-02-01 NOTE — Progress Notes (Signed)
Subjective:   Vanessa Farmer is a 87 y.o. female who presents for Medicare Annual (Subsequent) preventive examination.  Visit Complete: Virtual  I connected with  Vanessa Farmer on 02/01/23 by a audio enabled telemedicine application and verified that I am speaking with the correct person using two identifiers.  Patient Location: Home  Provider Location: Office/Clinic  I discussed the limitations of evaluation and management by telemedicine. The patient expressed understanding and agreed to proceed.  Vital Signs: Unable to obtain new vitals due to this being a telehealth visit.  Cardiac Risk Factors include: advanced age (>29men, >21 women);hypertension;dyslipidemia     Objective:    Today's Vitals   02/01/23 1508  PainSc: 0-No pain   There is no height or weight on file to calculate BMI.     02/01/2023    3:53 PM 01/24/2023    6:56 PM 01/19/2023    3:49 PM 10/11/2022    9:46 PM 10/11/2022    2:29 AM 04/04/2022   11:51 AM 12/31/2021    2:59 PM  Advanced Directives  Does Patient Have a Medical Advance Directive? No No No  No No   Would patient like information on creating a medical advance directive? No - Patient declined   No - Patient declined  No - Patient declined No - Patient declined    Current Medications (verified) Outpatient Encounter Medications as of 02/01/2023  Medication Sig   apixaban (ELIQUIS) 2.5 MG TABS tablet Take 1 tablet (2.5 mg total) by mouth 2 (two) times daily.   atorvastatin (LIPITOR) 40 MG tablet Take 1 tablet (40 mg total) by mouth daily.   diltiazem (CARDIZEM CD) 240 MG 24 hr capsule Take 1 capsule (240 mg total) by mouth daily.   furosemide (LASIX) 20 MG tablet Take 1 tablet (20 mg total) by mouth daily.   gabapentin (NEURONTIN) 100 MG capsule Take 1 capsule (100 mg total) by mouth 3 (three) times daily.   lisinopril (ZESTRIL) 20 MG tablet TAKE 1 TABLET(20 MG) BY MOUTH DAILY   Multiple Vitamins-Calcium (ONE-A-DAY WOMENS PO) Take 1 tablet by mouth  daily.   oxyCODONE-acetaminophen (PERCOCET) 5-325 MG tablet Take 1 tablet by mouth every 4 (four) hours as needed for severe pain.   pantoprazole (PROTONIX) 40 MG tablet Take 1 tablet (40 mg total) by mouth daily.   polyethylene glycol (MIRALAX / GLYCOLAX) 17 g packet Take 17 g by mouth daily.   promethazine (PHENERGAN) 12.5 MG tablet Take by mouth.   senna-docusate (SENOKOT-S) 8.6-50 MG tablet Take 1 tablet by mouth at bedtime as needed for mild constipation.   sertraline (ZOLOFT) 50 MG tablet Take 1 tablet (50 mg total) by mouth daily.   traMADol (ULTRAM) 50 MG tablet Take 50 mg by mouth 2 (two) times daily.   Wound Dressings (MEDIHONEY WOUND/BURN DRESSING) GEL Apply topically.   ENSURE (ENSURE) Take 237 mLs by mouth 2 (two) times daily between meals.   No facility-administered encounter medications on file as of 02/01/2023.    Allergies (verified) Patient has no known allergies.   History: Past Medical History:  Diagnosis Date   Acute cholecystitis 10/11/2022   Anemia    Cataract    Depression    Fall    GERD (gastroesophageal reflux disease)    Hyperlipidemia    Hypertension    Myocardial infarction (HCC)    "mild" many yrs ago   Past Surgical History:  Procedure Laterality Date   ABDOMINAL HYSTERECTOMY     CATARACT EXTRACTION, BILATERAL  2021   COLONOSCOPY WITH PROPOFOL N/A 12/05/2017   Procedure: COLONOSCOPY WITH PROPOFOL;  Surgeon: Midge Minium, MD;  Location: [facility];  Service: Endoscopy;  Laterality: N/A;   ESOPHAGOGASTRODUODENOSCOPY (EGD) WITH PROPOFOL N/A 12/05/2017   Procedure: ESOPHAGOGASTRODUODENOSCOPY (EGD) WITH PROPOFOL;  Surgeon: Midge Minium, MD;  Location: [facility];  Service: Endoscopy;  Laterality: N/A;   IR CHOLANGIOGRAM EXISTING TUBE  12/31/2022   IR EXCHANGE BILIARY DRAIN  12/09/2022   IR EXCHANGE BILIARY DRAIN  01/21/2023   IR PERC CHOLECYSTOSTOMY  10/12/2022   IR RADIOLOGIST EVAL & MGMT  12/09/2022   POLYPECTOMY  12/05/2017   Procedure:  POLYPECTOMY;  Surgeon: Midge Minium, MD;  Location: [facility];  Service: Endoscopy;;   TONSILLECTOMY     Family History  Problem Relation Age of Onset   Heart disease Mother    Brain cancer Mother    Heart attack Father    Liver disease Daughter    Throat cancer Brother    Cancer Maternal Grandfather        Unsure type   Social History   Socioeconomic History   Marital status: Widowed    Spouse name: Not on file   Number of children: 4   Years of education: 9 th grade   Highest education level: 9th grade  Occupational History   Occupation: Retired  Tobacco Use   Smoking status: Never    Passive exposure: Never   Smokeless tobacco: Never   Tobacco comments:    smoking cessation materials not required  Vaping Use   Vaping status: Never Used  Substance and Sexual Activity   Alcohol use: No   Drug use: No   Sexual activity: Not Currently  Other Topics Concern   Not on file  Social History Narrative   Pt lives with her son   Social Determinants of Health   Financial Resource Strain: Low Risk  (02/01/2023)   Overall Financial Resource Strain (CARDIA)    Difficulty of Paying Living Expenses: Not hard at all  Food Insecurity: No Food Insecurity (02/01/2023)   Hunger Vital Sign    Worried About Running Out of Food in the Last Year: Never true    Ran Out of Food in the Last Year: Never true  Transportation Needs: No Transportation Needs (02/01/2023)   PRAPARE - Administrator, Civil Service (Medical): No    Lack of Transportation (Non-Medical): No  Physical Activity: Insufficiently Active (02/01/2023)   Exercise Vital Sign    Days of Exercise per Week: 5 days    Minutes of Exercise per Session: 10 min  Stress: No Stress Concern Present (02/01/2023)   [employer] of Occupational Health - Occupational Stress Questionnaire    Feeling of Stress : Only a little  Social Connections: Socially Isolated (02/01/2023)   Social Connection and Isolation  Panel [NHANES]    Frequency of Communication with Friends and Family: Three times a week    Frequency of Social Gatherings with Friends and Family: More than three times a week    Attends Religious Services: Never    Database administrator or Organizations: No    Attends Banker Meetings: Never    Marital Status: Widowed    Tobacco Counseling Counseling given: Not Answered Tobacco comments: smoking cessation materials not required   Clinical Intake:  Pre-visit preparation completed: Yes  Pain : No/denies pain Pain Score: 0-No pain     Nutritional Status: BMI <19  Underweight Nutritional Risks: None Diabetes:  No  How often do you need to have someone help you when you read instructions, pamphlets, or other written materials from your doctor or pharmacy?: 1 - Never  Interpreter Needed?: No  Information entered by :: Kennedy Bucker, LPN   Activities of Daily Living    02/01/2023    3:55 PM 10/11/2022    9:45 PM  In your present state of health, do you have any difficulty performing the following activities:  Hearing? 0 0  Vision? 0 0  Difficulty concentrating or making decisions? 0 0  Walking or climbing stairs? 0 1  Dressing or bathing? 0 0  Doing errands, shopping? 0 0  Preparing Food and eating ? N   Using the Toilet? N   In the past six months, have you accidently leaked urine? N   Do you have problems with loss of bowel control? N   Managing your Medications? Y   Managing your Finances? Y   Housekeeping or managing your Housekeeping? Y     Patient Care Team: Reubin Milan, MD as PCP - General (Internal Medicine) Midge Minium, MD as Consulting Physician (Gastroenterology) Delma Freeze, FNP (Cardiology) Alwyn Pea, MD as Consulting Physician (Cardiology)  Indicate any recent Medical Services you may have received from other than [facility] providers in the past year (date may be approximate).     Assessment:   This is a routine  wellness examination for Vanessa Farmer.  Hearing/Vision screen Hearing Screening - Comments:: No aids Vision Screening - Comments:: Wears glasses- Dr.Woodard   Goals Addressed             This Visit's Progress    DIET - EAT MORE FRUITS AND VEGETABLES         Depression Screen    02/01/2023    3:52 PM 02/22/2022    3:16 PM 01/29/2022    2:58 PM 01/19/2022    2:18 PM 01/24/2021    2:19 PM 10/17/2020    8:10 AM 07/28/2020    3:08 PM  PHQ 2/9 Scores  PHQ - 2 Score 2 0 0 2 1 0 0  PHQ- 9 Score 4 0 0 2 2 0 0    Fall Risk    02/01/2023    3:54 PM 02/22/2022    3:16 PM 01/29/2022    2:59 PM 01/19/2022    2:18 PM 01/24/2021    2:08 PM  Fall Risk   Falls in the past year? 0 0 0 1 1  Number falls in past yr: 0 0 0 1 1  Injury with Fall? 0 0 0 1 1  Risk for fall due to : Impaired mobility No Fall Risks No Fall Risks History of fall(s) History of fall(s);Other (Comment)  Risk for fall due to: Comment     Per pt due to Dizziness  Follow up Falls prevention discussed;Falls evaluation completed Falls evaluation completed Falls evaluation completed Falls evaluation completed Falls evaluation completed;Follow up appointment  Comment     Informed patient to call office to inform them about her falls and schedule an appt    MEDICARE RISK AT HOME: Medicare Risk at Home Any stairs in or around the home?: No If so, are there any without handrails?: No Home free of loose throw rugs in walkways, pet beds, electrical cords, etc?: Yes Adequate lighting in your home to reduce risk of falls?: Yes Life alert?: No Use of a cane, walker or w/c?: Yes (walker) Grab bars in the bathroom?: No Shower chair  or bench in shower?: No Elevated toilet seat or a handicapped toilet?: No  TIMED UP AND GO:  Was the test performed?  No    Cognitive Function:        02/01/2023    3:56 PM 01/29/2022    3:03 PM 01/24/2021    2:14 PM 08/20/2019    9:47 AM 07/19/2018    9:17 AM  6CIT Screen  What Year? 0 points 0  points 4 points 0 points 0 points  What month? 3 points 3 points 3 points 0 points 0 points  What time? 0 points 0 points 0 points 0 points 0 points  Count back from 20 0 points 0 points 0 points 0 points 0 points  Months in reverse 4 points 4 points 4 points 4 points 4 points  Repeat phrase 10 points 10 points -- 10 points 10 points  Total Score 17 points 17 points  14 points 14 points    Immunizations Immunization History  Administered Date(s) Administered   Fluad Quad(high Dose 65+) 03/20/2019, 03/21/2020, 01/19/2022   Influenza, High Dose Seasonal PF 01/31/2017   Pneumococcal Conjugate-13 06/07/2016   Pneumococcal Polysaccharide-23 01/17/2018   Tdap 06/07/2016    TDAP status: Up to date  Flu Vaccine status: Up to date  Pneumococcal vaccine status: Up to date  Covid-19 vaccine status: Completed vaccines  Qualifies for Shingles Vaccine? Yes   Zostavax completed No   Shingrix Completed?: No.    Education has been provided regarding the importance of this vaccine. Patient has been advised to call insurance company to determine out of pocket expense if they have not yet received this vaccine. Advised may also receive vaccine at local pharmacy or Health Dept. Verbalized acceptance and understanding.  Screening Tests Health Maintenance  Topic Date Due   Zoster Vaccines- Shingrix (1 of 2) Never done   INFLUENZA VACCINE  12/09/2022   Medicare Annual Wellness (AWV)  02/01/2024   DTaP/Tdap/Td (2 - Td or Tdap) 06/07/2026   Pneumonia Vaccine 58+ Years old  Completed   DEXA SCAN  Completed   HPV VACCINES  Aged Out   COVID-19 Vaccine  Discontinued    Health Maintenance  Health Maintenance Due  Topic Date Due   Zoster Vaccines- Shingrix (1 of 2) Never done   INFLUENZA VACCINE  12/09/2022    Colorectal cancer screening: No longer required.   Mammogram status: No longer required due to age.   Lung Cancer Screening: (Low Dose CT Chest recommended if Age 63-80 years, 20  pack-year currently smoking OR have quit w/in 15years.) does not qualify.    Additional Screening:  Hepatitis C Screening: does not qualify; Completed no  Vision Screening: Recommended annual ophthalmology exams for early detection of glaucoma and other disorders of the eye. Is the patient up to date with their annual eye exam?  Yes  Who is the provider or what is the name of the office in which the patient attends annual eye exams? Dr.Woodard If pt is not established with a provider, would they like to be referred to a provider to establish care? No .   Dental Screening: Recommended annual dental exams for proper oral hygiene    Community Resource Referral / Chronic Care Management: CRR required this visit?  No   CCM required this visit?  No     Plan:     I have personally reviewed and noted the following in the patient's chart:   Medical and social history Use of alcohol, tobacco or  illicit drugs  Current medications and supplements including opioid prescriptions. Patient is currently taking opioid prescriptions. Information provided to patient regarding non-opioid alternatives. Patient advised to discuss non-opioid treatment plan with their provider. Functional ability and status Nutritional status Physical activity Advanced directives List of other physicians Hospitalizations, surgeries, and ER visits in previous 12 months Vitals Screenings to include cognitive, depression, and falls Referrals and appointments  In addition, I have reviewed and discussed with patient certain preventive protocols, quality metrics, and best practice recommendations. A written personalized care plan for preventive services as well as general preventive health recommendations were provided to patient.     Hal Hope, LPN   08/16/8117   After Visit Summary: (MyChart) Due to this being a telephonic visit, the after visit summary with patients personalized plan was offered to patient  via MyChart   Nurse Notes: none

## 2023-02-01 NOTE — Patient Instructions (Addendum)
Ms. Passalaqua , Thank you for taking time to come for your Medicare Wellness Visit. I appreciate your ongoing commitment to your health goals. Please review the following plan we discussed and let me know if I can assist you in the future.   Referrals/Orders/Follow-Ups/Clinician Recommendations: none  This is a list of the screening recommended for you and due dates:  Health Maintenance  Topic Date Due   Zoster (Shingles) Vaccine (1 of 2) Never done   Flu Shot  12/09/2022   Medicare Annual Wellness Visit  02/01/2024   DTaP/Tdap/Td vaccine (2 - Td or Tdap) 06/07/2026   Pneumonia Vaccine  Completed   DEXA scan (bone density measurement)  Completed   HPV Vaccine  Aged Out   COVID-19 Vaccine  Discontinued    Advanced directives: (ACP Link)Information on Advanced Care Planning can be found at [facility] Advance Health Care Directives Advance Health Care Directives ([email or web address]   Next Medicare Annual Wellness Visit scheduled for next year: Yes   02/07/24 @ 2:30 pm in person

## 2023-02-03 DIAGNOSIS — I5030 Unspecified diastolic (congestive) heart failure: Secondary | ICD-10-CM | POA: Diagnosis not present

## 2023-02-03 DIAGNOSIS — I130 Hypertensive heart and chronic kidney disease with heart failure and stage 1 through stage 4 chronic kidney disease, or unspecified chronic kidney disease: Secondary | ICD-10-CM | POA: Diagnosis not present

## 2023-02-03 DIAGNOSIS — Z4803 Encounter for change or removal of drains: Secondary | ICD-10-CM | POA: Diagnosis not present

## 2023-02-03 DIAGNOSIS — Z48815 Encounter for surgical aftercare following surgery on the digestive system: Secondary | ICD-10-CM | POA: Diagnosis not present

## 2023-02-03 DIAGNOSIS — K8020 Calculus of gallbladder without cholecystitis without obstruction: Secondary | ICD-10-CM | POA: Diagnosis not present

## 2023-02-03 DIAGNOSIS — N1831 Chronic kidney disease, stage 3a: Secondary | ICD-10-CM | POA: Diagnosis not present

## 2023-02-04 DIAGNOSIS — Z48815 Encounter for surgical aftercare following surgery on the digestive system: Secondary | ICD-10-CM | POA: Diagnosis not present

## 2023-02-04 DIAGNOSIS — I130 Hypertensive heart and chronic kidney disease with heart failure and stage 1 through stage 4 chronic kidney disease, or unspecified chronic kidney disease: Secondary | ICD-10-CM | POA: Diagnosis not present

## 2023-02-04 DIAGNOSIS — Z4803 Encounter for change or removal of drains: Secondary | ICD-10-CM | POA: Diagnosis not present

## 2023-02-04 DIAGNOSIS — K8020 Calculus of gallbladder without cholecystitis without obstruction: Secondary | ICD-10-CM | POA: Diagnosis not present

## 2023-02-04 DIAGNOSIS — N1831 Chronic kidney disease, stage 3a: Secondary | ICD-10-CM | POA: Diagnosis not present

## 2023-02-04 DIAGNOSIS — I5030 Unspecified diastolic (congestive) heart failure: Secondary | ICD-10-CM | POA: Diagnosis not present

## 2023-02-09 DIAGNOSIS — K8020 Calculus of gallbladder without cholecystitis without obstruction: Secondary | ICD-10-CM | POA: Diagnosis not present

## 2023-02-09 DIAGNOSIS — Z48815 Encounter for surgical aftercare following surgery on the digestive system: Secondary | ICD-10-CM | POA: Diagnosis not present

## 2023-02-09 DIAGNOSIS — N1831 Chronic kidney disease, stage 3a: Secondary | ICD-10-CM | POA: Diagnosis not present

## 2023-02-09 DIAGNOSIS — I130 Hypertensive heart and chronic kidney disease with heart failure and stage 1 through stage 4 chronic kidney disease, or unspecified chronic kidney disease: Secondary | ICD-10-CM | POA: Diagnosis not present

## 2023-02-09 DIAGNOSIS — I5030 Unspecified diastolic (congestive) heart failure: Secondary | ICD-10-CM | POA: Diagnosis not present

## 2023-02-09 DIAGNOSIS — Z4803 Encounter for change or removal of drains: Secondary | ICD-10-CM | POA: Diagnosis not present

## 2023-02-11 ENCOUNTER — Ambulatory Visit (INDEPENDENT_AMBULATORY_CARE_PROVIDER_SITE_OTHER): Payer: Medicare Other | Admitting: Surgery

## 2023-02-11 ENCOUNTER — Encounter: Payer: Self-pay | Admitting: Surgery

## 2023-02-11 VITALS — BP 125/71 | HR 86 | Ht 60.0 in | Wt 109.8 lb

## 2023-02-11 DIAGNOSIS — K810 Acute cholecystitis: Secondary | ICD-10-CM

## 2023-02-11 DIAGNOSIS — T85518A Breakdown (mechanical) of other gastrointestinal prosthetic devices, implants and grafts, initial encounter: Secondary | ICD-10-CM

## 2023-02-11 NOTE — Patient Instructions (Addendum)
PLEASE CONNECT THE DRAIN AS SOON AS YOU GET HOME. And change dressing every other day or sooner if it is getting soiled.   You need to see Cardiology before we can do surgery.    Cholecystitis  Cholecystitis is irritation and swelling (inflammation) of the gallbladder. The gallbladder: Is an organ that is shaped like a pear. Is under the liver on the right side of the body. Stores bile. Bile helps the body break down (digest) the fats in food. This condition can occur all of a sudden. It needs to be treated. What are the causes? This condition may be caused by stones or lumps that form in the gallbladder (gallstones). Gallstones can block the tube (duct) that carries bile out of your gallbladder. Other causes include: Damage to the gallbladder due to less blood flow. Germs in the bile ducts. Scars, kinks, or adhesions in the bile ducts. Abnormal growths (tumors) in the liver, pancreas, or gallbladder. What increases the risk? You are more likely to develop this condition if: You are female and between the ages of 76-62. You take birth control pills. You use estrogen. You take certain medicines that make you more likely to develop gallstones. You are overweight (obese). You have a very bad reaction to an infection (sepsis). You have been hospitalized due to a serious condition, such as a burn or illness. You have not eaten or drank for a long time. What are the signs or symptoms? Symptoms of this condition include: Pain in the upper right part of the belly (abdomen). A lump over the gallbladder. Bloating in the belly. Feeling sick to your stomach (nauseous). Vomiting. Fever. Chills. How is this treated? This condition may be treated with: Medicines to treat pain. Giving fluids through an IV tube. Not eating or drinking (fasting). Antibiotic medicines. Surgery to take out your gallbladder. Gallbladder drainage. Follow these instructions at home: Medicines  Take  over-the-counter and prescription medicines only as told by your doctor. If you were prescribed an antibiotic medicine, take it as told by your doctor. Do not stop taking it even if you start to feel better. General instructions Follow instructions from your doctor about what to eat or drink. Do not eat or drink anything that makes you sick again. Do not smoke or use any products that contain nicotine or tobacco. If you need help quitting, ask your doctor. Keep all follow-up visits. Contact a doctor if: You have pain and your medicine does not help. You have a fever. Get help right away if: Your pain moves to: Another part of your belly. Your back. Your symptoms do not go away. You have new symptoms. These symptoms may be an emergency. Get help right away. Call 911. Do not wait to see if the symptoms will go away. Do not drive yourself to the hospital. Summary This condition may be caused by stones or lumps that form in the gallbladder (gallstones). A common symptom is pain in your belly. This condition may be treated with surgery to take out your gallbladder. Follow instructions from your doctor about what to eat or drink. This information is not intended to replace advice given to you by your health care provider. Make sure you discuss any questions you have with your health care provider. Document Revised: 10/28/2020 Document Reviewed: 10/28/2020 Elsevier Patient Education  2024 [employer].

## 2023-02-11 NOTE — Progress Notes (Unsigned)
02/11/2023  History of Present Illness: Vanessa Farmer is a 87 y.o. female presenting for follow-up of acute cholecystitis with percutaneous cholecystostomy drain.  She was last seen on 01/21/2023 at which time she was having burning sensation in the drain site as well as pain and not being able to sleep.  She was sent to interventional radiology and her drain was exchanged.  At that time her drain was capped with instructions to be connected to bag as needed.  The patient today reports that she continues to have pain and now is having more itching and pain at the drain insertion site itself.  She has not reconnected the drain to the bag yet.  She has yet to see cardiology on follow-up.  On her last visit in July, the patient was supposed to have an echocardiogram and follow-up in August but this was never done.  Past Medical History: Past Medical History:  Diagnosis Date   Acute cholecystitis 10/11/2022   Anemia    Cataract    Depression    Fall    GERD (gastroesophageal reflux disease)    Hyperlipidemia    Hypertension    Myocardial infarction [facility])    "mild" many yrs ago     Past Surgical History: Past Surgical History:  Procedure Laterality Date   ABDOMINAL HYSTERECTOMY     CATARACT EXTRACTION, BILATERAL  2021   COLONOSCOPY WITH PROPOFOL N/A 12/05/2017   Procedure: COLONOSCOPY WITH PROPOFOL;  Surgeon: Midge Minium, MD;  Location: [facility];  Service: Endoscopy;  Laterality: N/A;   ESOPHAGOGASTRODUODENOSCOPY (EGD) WITH PROPOFOL N/A 12/05/2017   Procedure: ESOPHAGOGASTRODUODENOSCOPY (EGD) WITH PROPOFOL;  Surgeon: Midge Minium, MD;  Location: [facility];  Service: Endoscopy;  Laterality: N/A;   IR CHOLANGIOGRAM EXISTING TUBE  12/31/2022   IR EXCHANGE BILIARY DRAIN  12/09/2022   IR EXCHANGE BILIARY DRAIN  01/21/2023   IR PERC CHOLECYSTOSTOMY  10/12/2022   IR RADIOLOGIST EVAL & MGMT  12/09/2022   POLYPECTOMY  12/05/2017   Procedure: POLYPECTOMY;  Surgeon: Midge Minium, MD;   Location: [facility];  Service: Endoscopy;;   TONSILLECTOMY      Home Medications: Prior to Admission medications   Medication Sig Start Date End Date Taking? Authorizing Provider  apixaban (ELIQUIS) 2.5 MG TABS tablet Take 1 tablet (2.5 mg total) by mouth 2 (two) times daily. 12/17/22  Yes Reubin Milan, MD  atorvastatin (LIPITOR) 40 MG tablet Take 1 tablet (40 mg total) by mouth daily. 12/17/22  Yes Reubin Milan, MD  diltiazem (CARDIZEM CD) 240 MG 24 hr capsule Take 1 capsule (240 mg total) by mouth daily. 12/17/22  Yes Reubin Milan, MD  furosemide (LASIX) 20 MG tablet Take 1 tablet (20 mg total) by mouth daily. 12/17/22  Yes Reubin Milan, MD  gabapentin (NEURONTIN) 100 MG capsule Take 1 capsule (100 mg total) by mouth 3 (three) times daily. 01/21/23  Yes Haani Bakula, Elita Quick, MD  lisinopril (ZESTRIL) 20 MG tablet TAKE 1 TABLET(20 MG) BY MOUTH DAILY 01/24/23  Yes Reubin Milan, MD  Multiple Vitamins-Calcium (ONE-A-DAY WOMENS PO) Take 1 tablet by mouth daily.   Yes [provider]  oxyCODONE-acetaminophen (PERCOCET) 5-325 MG tablet Take 1 tablet by mouth every 4 (four) hours as needed for severe pain. 11/12/22 11/12/23 Yes Willy Eddy, MD  pantoprazole (PROTONIX) 40 MG tablet Take 1 tablet (40 mg total) by mouth daily. 12/17/22  Yes Reubin Milan, MD  polyethylene glycol (MIRALAX / GLYCOLAX) 17 g packet Take 17  g by mouth daily. 10/16/22  Yes Delfino Lovett, MD  promethazine (PHENERGAN) 12.5 MG tablet Take by mouth. 10/22/22  Yes [provider]  senna-docusate (SENOKOT-S) 8.6-50 MG tablet Take 1 tablet by mouth at bedtime as needed for mild constipation. 10/15/22  Yes Delfino Lovett, MD  sertraline (ZOLOFT) 50 MG tablet Take 1 tablet (50 mg total) by mouth daily. 12/17/22  Yes Reubin Milan, MD  traMADol (ULTRAM) 50 MG tablet Take 50 mg by mouth 2 (two) times daily.   Yes [provider]  Wound Dressings (MEDIHONEY WOUND/BURN DRESSING) GEL Apply topically.  10/19/22  Yes [provider]  ENSURE (ENSURE) Take 237 mLs by mouth 2 (two) times daily between meals. 10/20/17 10/17/20  Reubin Milan, MD    Allergies: No Known Allergies  Review of Systems: Review of Systems  Constitutional:  Negative for chills and fever.  Respiratory:  Negative for shortness of breath.   Cardiovascular:  Negative for chest pain.  Gastrointestinal:  Positive for abdominal pain. Negative for nausea and vomiting.  Musculoskeletal:  Positive for back pain and joint pain.    Physical Exam BP 125/71 (BP Location: Right Arm, Patient Position: Sitting, Cuff Size: Small)   Pulse 86   Ht 5' (1.524 m)   Wt 109 lb 12.8 oz (49.8 kg)   SpO2 92%   BMI 21.44 kg/m  CONSTITUTIONAL: No acute distress, well-nourished HEENT:  Normocephalic, atraumatic, extraocular motion intact. RESPIRATORY:  Normal respiratory effort without pathologic use of accessory muscles. CARDIOVASCULAR: Regular rhythm and rate. GI: The abdomen is soft, nondistended, with tenderness localized to the drain site in the right upper quadrant.  The patient has had drainage around the drain which has caused some excoriation of the surrounding skin.  New gauze dressing applied.  NEUROLOGIC:  Motor and sensation is grossly normal.  Cranial nerves are grossly intact. PSYCH:  Alert and oriented to person, place and time. Affect is normal.  Labs/Imaging: IR biliary drain exchange on 01/21/2023: IMPRESSION: 1. The gallbladder is completely contracted down around the cholecystostomy tube. Unable to opacify the gallbladder lumen, cystic duct or central biliary ducts. 2. Successful exchange for a new 10 French percutaneous cholecystostomy tube. 3. The tube was left capped. Patient to return in 2 weeks to assess success or failure of this physiologic trial. If successful, consider tube removal.  Assessment and Plan: This is a 87 y.o. female with acute cholecystitis status post percutaneous  cholecystostomy drain placement.  - Discussed with patient that as soon as she gets home, she needs to reconnect her drain to the drainage bag.  The reason her skin is getting so excoriated is because fluid is leaking around the drain and getting her skin excoriated.  She needs to change her dressing at least every other day to keep that area clean and dry. - Also discussed with patient that she still needs to go see cardiology.  Will not be able to do any surgery for her unless she is cleared by them.  She is currently pending echocardiogram and follow-up after that. - Patient will call us when she has been seen by cardiology so that we can see her again and discuss surgery.  I spent 20 minutes dedicated to the care of this patient on the date of this encounter to include pre-visit review of records, face-to-face time with the patient discussing diagnosis and management, and any post-visit coordination of care.   Howie Ill, MD [facility]

## 2023-02-12 ENCOUNTER — Other Ambulatory Visit: Payer: Self-pay | Admitting: Surgery

## 2023-02-15 DIAGNOSIS — Z48815 Encounter for surgical aftercare following surgery on the digestive system: Secondary | ICD-10-CM | POA: Diagnosis not present

## 2023-02-15 DIAGNOSIS — K8020 Calculus of gallbladder without cholecystitis without obstruction: Secondary | ICD-10-CM | POA: Diagnosis not present

## 2023-02-15 DIAGNOSIS — I130 Hypertensive heart and chronic kidney disease with heart failure and stage 1 through stage 4 chronic kidney disease, or unspecified chronic kidney disease: Secondary | ICD-10-CM | POA: Diagnosis not present

## 2023-02-15 DIAGNOSIS — I5030 Unspecified diastolic (congestive) heart failure: Secondary | ICD-10-CM | POA: Diagnosis not present

## 2023-02-15 DIAGNOSIS — N1831 Chronic kidney disease, stage 3a: Secondary | ICD-10-CM | POA: Diagnosis not present

## 2023-02-15 DIAGNOSIS — Z4803 Encounter for change or removal of drains: Secondary | ICD-10-CM | POA: Diagnosis not present

## 2023-02-16 DIAGNOSIS — I4891 Unspecified atrial fibrillation: Secondary | ICD-10-CM | POA: Diagnosis not present

## 2023-02-16 DIAGNOSIS — I5030 Unspecified diastolic (congestive) heart failure: Secondary | ICD-10-CM | POA: Diagnosis not present

## 2023-02-16 DIAGNOSIS — N1831 Chronic kidney disease, stage 3a: Secondary | ICD-10-CM | POA: Diagnosis not present

## 2023-02-16 DIAGNOSIS — I700 Atherosclerosis of aorta: Secondary | ICD-10-CM | POA: Diagnosis not present

## 2023-02-16 DIAGNOSIS — I2510 Atherosclerotic heart disease of native coronary artery without angina pectoris: Secondary | ICD-10-CM | POA: Diagnosis not present

## 2023-02-16 DIAGNOSIS — Z86718 Personal history of other venous thrombosis and embolism: Secondary | ICD-10-CM | POA: Diagnosis not present

## 2023-02-16 DIAGNOSIS — I10 Essential (primary) hypertension: Secondary | ICD-10-CM | POA: Diagnosis not present

## 2023-02-16 DIAGNOSIS — E785 Hyperlipidemia, unspecified: Secondary | ICD-10-CM | POA: Diagnosis not present

## 2023-02-16 DIAGNOSIS — K219 Gastro-esophageal reflux disease without esophagitis: Secondary | ICD-10-CM | POA: Diagnosis not present

## 2023-02-16 DIAGNOSIS — R079 Chest pain, unspecified: Secondary | ICD-10-CM | POA: Diagnosis not present

## 2023-02-17 DIAGNOSIS — K8020 Calculus of gallbladder without cholecystitis without obstruction: Secondary | ICD-10-CM | POA: Diagnosis not present

## 2023-02-17 DIAGNOSIS — N1831 Chronic kidney disease, stage 3a: Secondary | ICD-10-CM | POA: Diagnosis not present

## 2023-02-17 DIAGNOSIS — I5030 Unspecified diastolic (congestive) heart failure: Secondary | ICD-10-CM | POA: Diagnosis not present

## 2023-02-17 DIAGNOSIS — I130 Hypertensive heart and chronic kidney disease with heart failure and stage 1 through stage 4 chronic kidney disease, or unspecified chronic kidney disease: Secondary | ICD-10-CM | POA: Diagnosis not present

## 2023-02-17 DIAGNOSIS — Z4803 Encounter for change or removal of drains: Secondary | ICD-10-CM | POA: Diagnosis not present

## 2023-02-17 DIAGNOSIS — Z48815 Encounter for surgical aftercare following surgery on the digestive system: Secondary | ICD-10-CM | POA: Diagnosis not present

## 2023-02-20 DIAGNOSIS — N1831 Chronic kidney disease, stage 3a: Secondary | ICD-10-CM | POA: Diagnosis not present

## 2023-02-20 DIAGNOSIS — E785 Hyperlipidemia, unspecified: Secondary | ICD-10-CM | POA: Diagnosis not present

## 2023-02-20 DIAGNOSIS — E440 Moderate protein-calorie malnutrition: Secondary | ICD-10-CM | POA: Diagnosis not present

## 2023-02-20 DIAGNOSIS — I700 Atherosclerosis of aorta: Secondary | ICD-10-CM | POA: Diagnosis not present

## 2023-02-20 DIAGNOSIS — I5030 Unspecified diastolic (congestive) heart failure: Secondary | ICD-10-CM | POA: Diagnosis not present

## 2023-02-20 DIAGNOSIS — K219 Gastro-esophageal reflux disease without esophagitis: Secondary | ICD-10-CM | POA: Diagnosis not present

## 2023-02-20 DIAGNOSIS — Z86718 Personal history of other venous thrombosis and embolism: Secondary | ICD-10-CM | POA: Diagnosis not present

## 2023-02-20 DIAGNOSIS — Z87891 Personal history of nicotine dependence: Secondary | ICD-10-CM | POA: Diagnosis not present

## 2023-02-20 DIAGNOSIS — I252 Old myocardial infarction: Secondary | ICD-10-CM | POA: Diagnosis not present

## 2023-02-20 DIAGNOSIS — K8020 Calculus of gallbladder without cholecystitis without obstruction: Secondary | ICD-10-CM | POA: Diagnosis not present

## 2023-02-20 DIAGNOSIS — K5900 Constipation, unspecified: Secondary | ICD-10-CM | POA: Diagnosis not present

## 2023-02-20 DIAGNOSIS — Z48815 Encounter for surgical aftercare following surgery on the digestive system: Secondary | ICD-10-CM | POA: Diagnosis not present

## 2023-02-20 DIAGNOSIS — R1311 Dysphagia, oral phase: Secondary | ICD-10-CM | POA: Diagnosis not present

## 2023-02-20 DIAGNOSIS — Z7901 Long term (current) use of anticoagulants: Secondary | ICD-10-CM | POA: Diagnosis not present

## 2023-02-20 DIAGNOSIS — I130 Hypertensive heart and chronic kidney disease with heart failure and stage 1 through stage 4 chronic kidney disease, or unspecified chronic kidney disease: Secondary | ICD-10-CM | POA: Diagnosis not present

## 2023-02-20 DIAGNOSIS — Z4803 Encounter for change or removal of drains: Secondary | ICD-10-CM | POA: Diagnosis not present

## 2023-02-20 DIAGNOSIS — I2510 Atherosclerotic heart disease of native coronary artery without angina pectoris: Secondary | ICD-10-CM | POA: Diagnosis not present

## 2023-02-20 DIAGNOSIS — F32A Depression, unspecified: Secondary | ICD-10-CM | POA: Diagnosis not present

## 2023-02-21 DIAGNOSIS — I130 Hypertensive heart and chronic kidney disease with heart failure and stage 1 through stage 4 chronic kidney disease, or unspecified chronic kidney disease: Secondary | ICD-10-CM | POA: Diagnosis not present

## 2023-02-21 DIAGNOSIS — Z4803 Encounter for change or removal of drains: Secondary | ICD-10-CM | POA: Diagnosis not present

## 2023-02-21 DIAGNOSIS — Z48815 Encounter for surgical aftercare following surgery on the digestive system: Secondary | ICD-10-CM | POA: Diagnosis not present

## 2023-02-21 DIAGNOSIS — K8020 Calculus of gallbladder without cholecystitis without obstruction: Secondary | ICD-10-CM | POA: Diagnosis not present

## 2023-02-21 DIAGNOSIS — N1831 Chronic kidney disease, stage 3a: Secondary | ICD-10-CM | POA: Diagnosis not present

## 2023-02-21 DIAGNOSIS — I5030 Unspecified diastolic (congestive) heart failure: Secondary | ICD-10-CM | POA: Diagnosis not present

## 2023-02-24 DIAGNOSIS — Z4803 Encounter for change or removal of drains: Secondary | ICD-10-CM | POA: Diagnosis not present

## 2023-02-24 DIAGNOSIS — I5030 Unspecified diastolic (congestive) heart failure: Secondary | ICD-10-CM | POA: Diagnosis not present

## 2023-02-24 DIAGNOSIS — K8020 Calculus of gallbladder without cholecystitis without obstruction: Secondary | ICD-10-CM | POA: Diagnosis not present

## 2023-02-24 DIAGNOSIS — N1831 Chronic kidney disease, stage 3a: Secondary | ICD-10-CM | POA: Diagnosis not present

## 2023-02-24 DIAGNOSIS — Z48815 Encounter for surgical aftercare following surgery on the digestive system: Secondary | ICD-10-CM | POA: Diagnosis not present

## 2023-02-24 DIAGNOSIS — I130 Hypertensive heart and chronic kidney disease with heart failure and stage 1 through stage 4 chronic kidney disease, or unspecified chronic kidney disease: Secondary | ICD-10-CM | POA: Diagnosis not present

## 2023-02-25 ENCOUNTER — Telehealth: Payer: Self-pay

## 2023-02-25 NOTE — Progress Notes (Signed)
Cardiology Clearance has been received from Dr Juliann Pares. The patient is cleared at Low risk for surgery. She may hold her Eliquis for 3 days prior to surgery.

## 2023-02-25 NOTE — Telephone Encounter (Signed)
Patient needs to be scheduled for a follow up with Dr Aleen Campi for surgery discussion. Unable to leave a message.

## 2023-02-28 DIAGNOSIS — I5030 Unspecified diastolic (congestive) heart failure: Secondary | ICD-10-CM | POA: Diagnosis not present

## 2023-02-28 DIAGNOSIS — Z48815 Encounter for surgical aftercare following surgery on the digestive system: Secondary | ICD-10-CM | POA: Diagnosis not present

## 2023-02-28 DIAGNOSIS — Z4803 Encounter for change or removal of drains: Secondary | ICD-10-CM | POA: Diagnosis not present

## 2023-02-28 DIAGNOSIS — I130 Hypertensive heart and chronic kidney disease with heart failure and stage 1 through stage 4 chronic kidney disease, or unspecified chronic kidney disease: Secondary | ICD-10-CM | POA: Diagnosis not present

## 2023-02-28 DIAGNOSIS — K8020 Calculus of gallbladder without cholecystitis without obstruction: Secondary | ICD-10-CM | POA: Diagnosis not present

## 2023-02-28 DIAGNOSIS — N1831 Chronic kidney disease, stage 3a: Secondary | ICD-10-CM | POA: Diagnosis not present

## 2023-03-03 DIAGNOSIS — Z48815 Encounter for surgical aftercare following surgery on the digestive system: Secondary | ICD-10-CM | POA: Diagnosis not present

## 2023-03-03 DIAGNOSIS — N1831 Chronic kidney disease, stage 3a: Secondary | ICD-10-CM | POA: Diagnosis not present

## 2023-03-03 DIAGNOSIS — I130 Hypertensive heart and chronic kidney disease with heart failure and stage 1 through stage 4 chronic kidney disease, or unspecified chronic kidney disease: Secondary | ICD-10-CM | POA: Diagnosis not present

## 2023-03-03 DIAGNOSIS — K8020 Calculus of gallbladder without cholecystitis without obstruction: Secondary | ICD-10-CM | POA: Diagnosis not present

## 2023-03-03 DIAGNOSIS — Z4803 Encounter for change or removal of drains: Secondary | ICD-10-CM | POA: Diagnosis not present

## 2023-03-03 DIAGNOSIS — I5030 Unspecified diastolic (congestive) heart failure: Secondary | ICD-10-CM | POA: Diagnosis not present

## 2023-03-04 ENCOUNTER — Telehealth: Payer: Self-pay

## 2023-03-04 NOTE — Telephone Encounter (Signed)
2nd attempt made to contact the patient and her daughter. No answer and unable to leave a message due to the mailbox being full.

## 2023-03-11 ENCOUNTER — Emergency Department: Payer: Medicare Other

## 2023-03-11 ENCOUNTER — Emergency Department
Admission: EM | Admit: 2023-03-11 | Discharge: 2023-03-11 | Disposition: A | Discharge status: Home / Self Care | Payer: Medicare Other | Attending: Emergency Medicine | Admitting: Emergency Medicine

## 2023-03-11 ENCOUNTER — Other Ambulatory Visit: Payer: Self-pay

## 2023-03-11 DIAGNOSIS — T85528A Displacement of other gastrointestinal prosthetic devices, implants and grafts, initial encounter: Secondary | ICD-10-CM

## 2023-03-11 DIAGNOSIS — T85590A Other mechanical complication of bile duct prosthesis, initial encounter: Secondary | ICD-10-CM | POA: Diagnosis not present

## 2023-03-11 DIAGNOSIS — K838 Other specified diseases of biliary tract: Secondary | ICD-10-CM | POA: Diagnosis not present

## 2023-03-11 DIAGNOSIS — R1084 Generalized abdominal pain: Secondary | ICD-10-CM | POA: Diagnosis not present

## 2023-03-11 DIAGNOSIS — K8020 Calculus of gallbladder without cholecystitis without obstruction: Secondary | ICD-10-CM | POA: Diagnosis not present

## 2023-03-11 DIAGNOSIS — Y69 Unspecified misadventure during surgical and medical care: Secondary | ICD-10-CM | POA: Diagnosis not present

## 2023-03-11 DIAGNOSIS — R1011 Right upper quadrant pain: Secondary | ICD-10-CM | POA: Diagnosis not present

## 2023-03-11 DIAGNOSIS — I10 Essential (primary) hypertension: Secondary | ICD-10-CM | POA: Diagnosis not present

## 2023-03-11 DIAGNOSIS — T85520A Displacement of bile duct prosthesis, initial encounter: Secondary | ICD-10-CM | POA: Insufficient documentation

## 2023-03-11 DIAGNOSIS — R58 Hemorrhage, not elsewhere classified: Secondary | ICD-10-CM | POA: Diagnosis not present

## 2023-03-11 LAB — CBC WITH DIFFERENTIAL/PLATELET
Abs Immature Granulocytes: 0.02 10*3/uL (ref 0.00–0.07)
Basophils Absolute: 0 10*3/uL (ref 0.0–0.1)
Basophils Relative: 0 %
Eosinophils Absolute: 0.1 10*3/uL (ref 0.0–0.5)
Eosinophils Relative: 3 %
HCT: 32.4 % — ABNORMAL LOW (ref 36.0–46.0)
Hemoglobin: 10.8 g/dL — ABNORMAL LOW (ref 12.0–15.0)
Immature Granulocytes: 0 %
Lymphocytes Relative: 17 %
Lymphs Abs: 0.9 10*3/uL (ref 0.7–4.0)
MCH: 32.4 pg (ref 26.0–34.0)
MCHC: 33.3 g/dL (ref 30.0–36.0)
MCV: 97.3 fL (ref 80.0–100.0)
Monocytes Absolute: 0.4 10*3/uL (ref 0.1–1.0)
Monocytes Relative: 8 %
Neutro Abs: 3.7 10*3/uL (ref 1.7–7.7)
Neutrophils Relative %: 72 %
Platelets: 179 10*3/uL (ref 150–400)
RBC: 3.33 MIL/uL — ABNORMAL LOW (ref 3.87–5.11)
RDW: 13.2 % (ref 11.5–15.5)
WBC: 5.2 10*3/uL (ref 4.0–10.5)
nRBC: 0 % (ref 0.0–0.2)

## 2023-03-11 LAB — COMPREHENSIVE METABOLIC PANEL
ALT: 15 U/L (ref 0–44)
AST: 18 U/L (ref 15–41)
Albumin: 3.2 g/dL — ABNORMAL LOW (ref 3.5–5.0)
Alkaline Phosphatase: 71 U/L (ref 38–126)
Anion gap: 10 (ref 5–15)
BUN: 26 mg/dL — ABNORMAL HIGH (ref 8–23)
CO2: 27 mmol/L (ref 22–32)
Calcium: 8.8 mg/dL — ABNORMAL LOW (ref 8.9–10.3)
Chloride: 99 mmol/L (ref 98–111)
Creatinine, Ser: 1.18 mg/dL — ABNORMAL HIGH (ref 0.44–1.00)
GFR, Estimated: 45 mL/min — ABNORMAL LOW (ref >60)
Glucose, Bld: 123 mg/dL — ABNORMAL HIGH (ref 70–99)
Potassium: 4.1 mmol/L (ref 3.5–5.1)
Sodium: 136 mmol/L (ref 135–145)
Total Bilirubin: 0.6 mg/dL (ref 0.3–1.2)
Total Protein: 6.6 g/dL (ref 6.5–8.1)

## 2023-03-11 LAB — LIPASE, BLOOD: Lipase: 38 U/L (ref 11–51)

## 2023-03-11 MED ORDER — IOHEXOL 300 MG/ML  SOLN
65.0000 mL | Freq: Once | INTRAMUSCULAR | Status: AC | PRN
  Administered 2023-03-11: 65 mL via INTRAVENOUS

## 2023-03-11 NOTE — Discharge Instructions (Signed)
Where your drain was removed, there will be a small tract/opening that will last for a few days  It is OK for this to drain, and you should keep a clean dressing over the site.  IF you notice significant bleeding, foul smell, redness, or thick, purulent (pus) drainage, return to the ER immediately.  For now, follow-up with Dr. Aleen Campi in 2-3 days for check-up of your tract/site.  There is a chance that this will heal on its own, or may need repeat placement. It's too soon to tell. Keep an eye on it, and if you get fever, worsening pain, or any concerning symptoms, return to the ER immediately.

## 2023-03-11 NOTE — ED Triage Notes (Signed)
Patient's daughter was changing the bag to the biliary tube, and the tube became dislodged and was removed.  Patient is on blood thinners.   [facility] noted that bleeding was uncontrolled upon their arrival, but is controlled at this time.  Patient says she is having burning pain at the site where the tube was.  Patient has also been taking antibiotics for infection.

## 2023-03-11 NOTE — ED Provider Notes (Signed)
86 yo F with PMHx CAD, HTN, HLD, DVT of LE, HFpEF, cholecystitis s/p perc chole drain placement, here w/ dislodgement of tube. Imaging pending at time of sign out.  CT scan reviewed and shows that the cholecystostomy drain is no longer seen.  There is a focal area of hematoma.  A gallstone is likely in the tract of the dislodged catheter.  I discussed with Dr. Claudine Mouton, who reviewed the imaging.  He reviewed her lab work.  We had a long discussion regarding patient's clinical course.  He recommends holding on any antibiotics at this time, treating supportively, with outpatient follow-up.  It is unclear whether this is a sterile stone, or may ultimately cause issues, but at this time, do  not want to complicate the clinical picture with antibiotics.  Patient seems comfortable with this and feels overall well.  There is minimal drainage from the site.  She has no leukocytosis, fever, tachycardia, or signs to suggest sepsis.  Will discharge with family and good return precautions with outpatient surgical follow-up.   Shaune Pollack, MD 03/11/23 1242

## 2023-03-11 NOTE — ED Notes (Signed)
Pt aox4 requires assistance to ambulate is on 2L O2 via [place] RUQ abd drain site dressed with complete cover dressing

## 2023-03-11 NOTE — ED Provider Notes (Signed)
[facility] Provider Note    Event Date/Time   First MD Initiated Contact with Patient 03/11/23 0254     (approximate)   History   Biliary tube displacement   HPI  Vanessa Farmer is a 87 year old female with history of cholecystitis in June with biliary drain in place presenting to the emergency department for evaluation of dislodged biliary tube.  When her daughter was trying to change her bag, her tube was accidentally discharged. Daughter did report some ongoing abdominal pain with non-bloody emesis earlier today prior to tube dislodgement. Last dose of Eliquis was Thursday night. On EMS arrival, patient was noted to have bleeding from the site but this was able to be controlled.  Patient does report burning pain at the site of her tube.   I did review her ER visit from 01/19/2023.  At that time, there is concern for possible drain dislodgment, but x-Deejay Koppelman demonstrated adequate positioning and her bag was changed with a suture placed to keep the drain in place.  It does appear that her most recent drain was placed on 12/09/2022 with exchange of a 10 French percutaneous cholecystostomy tube with plans for exchange with IR in 3 months.    Physical Exam   Triage Vital Signs: ED Triage Vitals  Encounter Vitals Group     BP 03/11/23 0254 (!) 140/48     Systolic BP Percentile --      Diastolic BP Percentile --      Pulse Rate 03/11/23 0254 69     Resp 03/11/23 0254 20     Temp 03/11/23 0254 98.1 F (36.7 C)     Temp Source 03/11/23 0254 Oral     SpO2 03/11/23 0254 91 %     Weight 03/11/23 0257 115 lb 1.3 oz (52.2 kg)     Height 03/11/23 0257 5' (1.524 m)     Head Circumference --      Peak Flow --      Pain Score 03/11/23 0256 7     Pain Loc --      Pain Education --      Exclude from Growth Chart --     Most recent vital signs: Vitals:   03/11/23 0254 03/11/23 0724  BP: (!) 140/48 130/85  Pulse: 69 67  Resp: 20 20  Temp: 98.1 F (36.7 C)    SpO2: 91% 95%     General: Awake, interactive  CV:  Regular rate, good peripheral perfusion.  Resp:  Unlabored respirations.  Abd:  Soft, tender to palpation diffusely, tract and right upper abdomen consistent with location of patient's prior cholecystostomy tube Neuro:  Symmetric facial movement, fluid speech   ED Results / Procedures / Treatments   Labs (all labs ordered are listed, but only abnormal results are displayed) Labs Reviewed  CBC WITH DIFFERENTIAL/PLATELET - Abnormal; Notable for the following components:      Result Value   RBC 3.33 (*)    Hemoglobin 10.8 (*)    HCT 32.4 (*)    All other components within normal limits  COMPREHENSIVE METABOLIC PANEL - Abnormal; Notable for the following components:   Glucose, Bld 123 (*)    BUN 26 (*)    Creatinine, Ser 1.18 (*)    Calcium 8.8 (*)    Albumin 3.2 (*)    GFR, Estimated 45 (*)    All other components within normal limits  LIPASE, BLOOD     EKG EKG independently reviewed interpreted by myself (  ER attending) demonstrates:    RADIOLOGY Imaging independently reviewed and interpreted by myself demonstrates:  CT abdomen pelvis pending  PROCEDURES:  Critical Care performed: No  Procedures   MEDICATIONS ORDERED IN ED: Medications - No data to display   IMPRESSION / MDM / ASSESSMENT AND PLAN / ED COURSE  I reviewed the triage vital signs and the nursing notes.  Differential diagnosis includes, but is not limited to, dislodged tube without complication, worsening biliary pathology, other acute intra-abdominal pathology  Patient's presentation is most consistent with acute presentation with potential threat to life or bodily function.  87 year old female presenting to the emergency department for evaluation of dislodged cholecystostomy tube, also reporting worsening pain at her tube site and ongoing abdominal pain.  Vital stable on presentation.  Will review with general surgery.  Case reviewed with  Dr. Maurine Minister with general surgery.  Given patient's complaints of abdominal pain and vomiting does not think is unreasonable to obtain labs and a CT scan, but regarding her cholecystostomy tube, does not think it needs any emergent replacement. Reports sometimes patients are monitored to see how they do without it.  With this, if remainder of workup is otherwise reassuring, does feel that patient can be discharged with plans to follow-up with her surgeon.  Initial labs without critical derangement.  Signed out oncoming physician pending CT scan and disposition.     FINAL CLINICAL IMPRESSION(S) / ED DIAGNOSES   Final diagnoses:  Detachment of percutaneous cholecystostomy tube  Generalized abdominal pain     Rx / DC Orders   ED Discharge Orders     None        Note:  This document was prepared using Dragon voice recognition software and may include unintentional dictation errors.   Trinna Post, MD 03/11/23 [phone]

## 2023-03-11 NOTE — ED Notes (Signed)
Daughter and granddaughter updated. Daughter Steward Drone on her way to pick pt up

## 2023-03-17 DIAGNOSIS — K8020 Calculus of gallbladder without cholecystitis without obstruction: Secondary | ICD-10-CM | POA: Diagnosis not present

## 2023-03-17 DIAGNOSIS — I130 Hypertensive heart and chronic kidney disease with heart failure and stage 1 through stage 4 chronic kidney disease, or unspecified chronic kidney disease: Secondary | ICD-10-CM | POA: Diagnosis not present

## 2023-03-17 DIAGNOSIS — Z48815 Encounter for surgical aftercare following surgery on the digestive system: Secondary | ICD-10-CM | POA: Diagnosis not present

## 2023-03-17 DIAGNOSIS — Z4803 Encounter for change or removal of drains: Secondary | ICD-10-CM | POA: Diagnosis not present

## 2023-03-17 DIAGNOSIS — I5030 Unspecified diastolic (congestive) heart failure: Secondary | ICD-10-CM | POA: Diagnosis not present

## 2023-03-17 DIAGNOSIS — N1831 Chronic kidney disease, stage 3a: Secondary | ICD-10-CM | POA: Diagnosis not present

## 2023-03-23 ENCOUNTER — Ambulatory Visit: Payer: Medicare Other | Admitting: Surgery

## 2023-03-28 ENCOUNTER — Other Ambulatory Visit: Payer: Self-pay | Admitting: Internal Medicine

## 2023-03-28 DIAGNOSIS — K219 Gastro-esophageal reflux disease without esophagitis: Secondary | ICD-10-CM

## 2023-03-28 DIAGNOSIS — I10 Essential (primary) hypertension: Secondary | ICD-10-CM

## 2023-03-28 DIAGNOSIS — I482 Chronic atrial fibrillation, unspecified: Secondary | ICD-10-CM

## 2023-03-28 DIAGNOSIS — F321 Major depressive disorder, single episode, moderate: Secondary | ICD-10-CM

## 2023-03-28 DIAGNOSIS — E785 Hyperlipidemia, unspecified: Secondary | ICD-10-CM

## 2023-03-29 ENCOUNTER — Telehealth: Payer: Self-pay | Admitting: Surgery

## 2023-03-29 NOTE — Telephone Encounter (Signed)
Requested Prescriptions  Pending Prescriptions Disp Refills   ELIQUIS 2.5 MG TABS tablet [Pharmacy Med Name: ELIQUIS 2.5MG  TABLETS] 180 tablet 0    Sig: TAKE 1 TABLET(2.5 MG) BY MOUTH TWICE DAILY     Hematology:  Anticoagulants - apixaban Failed - 03/28/2023 10:13 AM      Failed - HGB in normal range and within 360 days    Hemoglobin  Date Value Ref Range Status  03/11/2023 10.8 (L) 12.0 - 15.0 g/dL Final  16/02/9603 54.0 11.1 - 15.9 g/dL Final         Failed - HCT in normal range and within 360 days    HCT  Date Value Ref Range Status  03/11/2023 32.4 (L) 36.0 - 46.0 % Final   Hematocrit  Date Value Ref Range Status  01/19/2022 37.5 34.0 - 46.6 % Final         Failed - Cr in normal range and within 360 days    Creatinine, Ser  Date Value Ref Range Status  03/11/2023 1.18 (H) 0.44 - 1.00 mg/dL Final         Passed - PLT in normal range and within 360 days    Platelets  Date Value Ref Range Status  03/11/2023 179 150 - 400 K/uL Final  01/19/2022 169 150 - 450 x10E3/uL Final         Passed - AST in normal range and within 360 days    AST  Date Value Ref Range Status  03/11/2023 18 15 - 41 U/L Final         Passed - ALT in normal range and within 360 days    ALT  Date Value Ref Range Status  03/11/2023 15 0 - 44 U/L Final         Passed - Valid encounter within last 12 months    Recent Outpatient Visits           3 months ago Essential hypertension   [place] [facility] at [facility], Nyoka Cowden, MD   1 year ago Essential hypertension   [place] [facility] at [facility], Nyoka Cowden, MD   1 year ago Acute deep vein thrombosis (DVT) of both peroneal veins [facility])   [place] [facility] at [facility], Nyoka Cowden, MD   2 years ago Essential hypertension   [place] [facility] at [facility], Nyoka Cowden, MD   2 years ago Essential  hypertension   [place] [facility] at [facility], Nyoka Cowden, MD       Future Appointments             In 1 week Reubin Milan, MD [facility] at [facility]             diltiazem (CARDIZEM CD) 240 MG 24 hr capsule [Pharmacy Med Name: DILTIAZEM CD 240MG  CAPSULES (24 HR)] 90 capsule 0    Sig: TAKE 1 CAPSULE(240 MG) BY MOUTH DAILY     Cardiovascular: Calcium Channel Blockers 3 Failed - 03/28/2023 10:13 AM      Failed - Cr in normal range and within 360 days    Creatinine, Ser  Date Value Ref Range Status  03/11/2023 1.18 (H) 0.44 - 1.00 mg/dL Final         Passed - ALT in normal range and within 360  days    ALT  Date Value Ref Range Status  03/11/2023 15 0 - 44 U/L Final         Passed - AST in normal range and within 360 days    AST  Date Value Ref Range Status  03/11/2023 18 15 - 41 U/L Final         Passed - Last BP in normal range    BP Readings from Last 1 Encounters:  03/11/23 124/77         Passed - Last Heart Rate in normal range    Pulse Readings from Last 1 Encounters:  03/11/23 60         Passed - Valid encounter within last 6 months    Recent Outpatient Visits           3 months ago Essential hypertension   [place] [facility] at [facility], Nyoka Cowden, MD   1 year ago Essential hypertension   [place] [facility] at [facility], Nyoka Cowden, MD   1 year ago Acute deep vein thrombosis (DVT) of both peroneal veins [facility])   [place] [facility] at [facility], Nyoka Cowden, MD   2 years ago Essential hypertension   [place] [facility] at [facility], Nyoka Cowden, MD   2 years ago Essential hypertension   [place] [facility] at [facility], Nyoka Cowden, MD       Future Appointments             In 1 week  Judithann Graves Nyoka Cowden, MD [facility] at [facility], [facility]             furosemide (LASIX) 20 MG tablet [Pharmacy Med Name: FUROSEMIDE 20MG  TABLETS] 90 tablet 0    Sig: TAKE 1 TABLET(20 MG) BY MOUTH DAILY     Cardiovascular:  Diuretics - Loop Failed - 03/28/2023 10:13 AM      Failed - Ca in normal range and within 180 days    Calcium  Date Value Ref Range Status  03/11/2023 8.8 (L) 8.9 - 10.3 mg/dL Final         Failed - Cr in normal range and within 180 days    Creatinine, Ser  Date Value Ref Range Status  03/11/2023 1.18 (H) 0.44 - 1.00 mg/dL Final         Passed - K in normal range and within 180 days    Potassium  Date Value Ref Range Status  03/11/2023 4.1 3.5 - 5.1 mmol/L Final         Passed - Na in normal range and within 180 days    Sodium  Date Value Ref Range Status  03/11/2023 136 135 - 145 mmol/L Final  01/19/2022 138 134 - 144 mmol/L Final         Passed - Cl in normal range and within 180 days    Chloride  Date Value Ref Range Status  03/11/2023 99 98 - 111 mmol/L Final         Passed - Mg Level in normal range and within 180 days    Magnesium  Date Value Ref Range Status  10/13/2022 1.9 1.7 - 2.4 mg/dL Final    Comment:    Performed at [facility], [address], [place], [place] [address]  Passed - Last BP in normal range    BP Readings from Last 1 Encounters:  03/11/23 124/77         Passed - Valid encounter within last 6 months    Recent Outpatient Visits           3 months ago Essential hypertension   [place] [facility] at [facility], Nyoka Cowden, MD   1 year ago Essential hypertension   [place] [facility] at [facility], Nyoka Cowden, MD   1 year ago Acute deep vein thrombosis (DVT) of both peroneal veins [facility])   [place] [facility] at [facility], Nyoka Cowden, MD   2 years ago  Essential hypertension   [place] [facility] at [facility], Nyoka Cowden, MD   2 years ago Essential hypertension   [place] [facility] Medicine at [facility], Nyoka Cowden, MD       Future Appointments             In 1 week Reubin Milan, MD [facility] Primary Care & Sports Medicine at [facility], [facility]             sertraline (ZOLOFT) 50 MG tablet [Pharmacy Med Name: SERTRALINE 50MG  TABLETS] 90 tablet 0    Sig: TAKE 1 TABLET(50 MG) BY MOUTH DAILY     Psychiatry:  Antidepressants - SSRI - sertraline Passed - 03/28/2023 10:13 AM      Passed - AST in normal range and within 360 days    AST  Date Value Ref Range Status  03/11/2023 18 15 - 41 U/L Final         Passed - ALT in normal range and within 360 days    ALT  Date Value Ref Range Status  03/11/2023 15 0 - 44 U/L Final         Passed - Completed PHQ-2 or PHQ-9 in the last 360 days      Passed - Valid encounter within last 6 months    Recent Outpatient Visits           3 months ago Essential hypertension   [place] [facility] at [facility], Nyoka Cowden, MD   1 year ago Essential hypertension   [place] [facility] at [facility], Nyoka Cowden, MD   1 year ago Acute deep vein thrombosis (DVT) of both peroneal veins [facility])   [place] [facility] at [facility], Nyoka Cowden, MD   2 years ago Essential hypertension    Primary Care & Sports Medicine at [facility], Nyoka Cowden, MD   2 years ago Essential hypertension    Primary Care & Sports Medicine at [facility], Nyoka Cowden, MD       Future Appointments             In 1 week Judithann Graves Nyoka Cowden, MD [facility] at [facility], [facility]             pantoprazole (PROTONIX) 40 MG tablet [Pharmacy Med Name: PANTOPRAZOLE 40MG   TABLETS] 90 tablet 0    Sig: TAKE 1 TABLET(40 MG) BY MOUTH DAILY     Gastroenterology: Proton Pump Inhibitors Passed - 03/28/2023 10:13 AM      Passed - Valid encounter  within last 12 months    Recent Outpatient Visits           3 months ago Essential hypertension   [place] [facility] at [facility], Nyoka Cowden, MD   1 year ago Essential hypertension   [place] [facility] at [facility], Nyoka Cowden, MD   1 year ago Acute deep vein thrombosis (DVT) of both peroneal veins [facility])   [place] [facility] at [facility], Nyoka Cowden, MD   2 years ago Essential hypertension   [place] [facility] at [facility], Nyoka Cowden, MD   2 years ago Essential hypertension   [place] [facility] at [facility], Nyoka Cowden, MD       Future Appointments             In 1 week Judithann Graves Nyoka Cowden, MD [facility] at [facility], [facility]             atorvastatin (LIPITOR) 40 MG tablet [Pharmacy Med Name: ATORVASTATIN 40MG  TABLETS] 90 tablet 0    Sig: TAKE 1 TABLET(40 MG) BY MOUTH DAILY     Cardiovascular:  Antilipid - Statins Failed - 03/28/2023 10:13 AM      Failed - Lipid Panel in normal range within the last 12 months    Cholesterol, Total  Date Value Ref Range Status  10/17/2020 162 100 - 199 mg/dL Final   LDL Chol Calc (NIH)  Date Value Ref Range Status  10/17/2020 80 0 - 99 mg/dL Final   HDL  Date Value Ref Range Status  10/17/2020 71 >39 mg/dL Final   Triglycerides  Date Value Ref Range Status  10/17/2020 52 0 - 149 mg/dL Final         Passed - Patient is not pregnant      Passed - Valid encounter within last 12 months    Recent Outpatient Visits           3 months ago Essential hypertension   [place] [facility] at [facility], Nyoka Cowden, MD    1 year ago Essential hypertension   [place] [facility] at [facility], Nyoka Cowden, MD   1 year ago Acute deep vein thrombosis (DVT) of both peroneal veins [facility])   [place] [facility] at [facility], Nyoka Cowden, MD   2 years ago Essential hypertension   [place] [facility] at [facility], Nyoka Cowden, MD   2 years ago Essential hypertension   [place] [facility] Primary Care & Sports Medicine at [facility], Nyoka Cowden, MD       Future Appointments             In 1 week Judithann Graves, Nyoka Cowden, MD [facility] Primary Care & Sports Medicine at [facility], [facility]

## 2023-03-29 NOTE — Telephone Encounter (Signed)
Multiple attempts have been made to contact patient and/or her daughter so that we can schedule follow up in office for her gallbladder. To date, no return call.

## 2023-03-31 ENCOUNTER — Telehealth: Payer: Self-pay | Admitting: Surgery

## 2023-03-31 NOTE — Telephone Encounter (Signed)
Received call from [facility] physical therapy and they have not been able to get in touch with patient either for her therapy treatments.

## 2023-04-05 ENCOUNTER — Encounter: Payer: Self-pay | Admitting: Internal Medicine

## 2023-04-05 ENCOUNTER — Telehealth: Payer: Self-pay | Admitting: Internal Medicine

## 2023-04-05 ENCOUNTER — Ambulatory Visit (INDEPENDENT_AMBULATORY_CARE_PROVIDER_SITE_OTHER): Payer: Medicare Other | Admitting: Internal Medicine

## 2023-04-05 VITALS — BP 128/60 | HR 96 | Ht 60.0 in | Wt 109.0 lb

## 2023-04-05 DIAGNOSIS — I4891 Unspecified atrial fibrillation: Secondary | ICD-10-CM

## 2023-04-05 DIAGNOSIS — F321 Major depressive disorder, single episode, moderate: Secondary | ICD-10-CM

## 2023-04-05 DIAGNOSIS — I10 Essential (primary) hypertension: Secondary | ICD-10-CM

## 2023-04-05 DIAGNOSIS — Z23 Encounter for immunization: Secondary | ICD-10-CM | POA: Diagnosis not present

## 2023-04-05 DIAGNOSIS — K8011 Calculus of gallbladder with chronic cholecystitis with obstruction: Secondary | ICD-10-CM

## 2023-04-05 MED ORDER — LISINOPRIL 20 MG PO TABS: 20.0000 mg | ORAL_TABLET | Freq: Every day | ORAL | 1 refills | Status: DC

## 2023-04-05 NOTE — Progress Notes (Signed)
Date:  04/05/2023   Name:  Vanessa Farmer   DOB:  27-Mar-1936   MRN:  [id number]   Chief Complaint: Hypertension  Hypertension This is a chronic problem. The problem is controlled. Pertinent negatives include no chest pain or shortness of breath. Past treatments include calcium channel blockers, ACE inhibitors and diuretics. The current treatment provides significant improvement. Hypertensive end-organ damage includes kidney disease. There is no history of CAD/MI or CVA.  Depression        This is a chronic problem.The problem is unchanged.  Associated symptoms include no fatigue.  Past treatments include SSRIs - Selective serotonin reuptake inhibitors. Afib - rated controlled on CCB.  Chronic cholecystitis - drain is now out.  Cardiology has cleared her for surgery but Dr. Adelene Idler office has been unable to get in touch with patient or daughter. She continues to have pain and itching.  No N/V or diarrhea.  Reflux is more frequent.   Review of Systems  Constitutional:  Negative for chills, fatigue and fever.  HENT:  Negative for trouble swallowing.   Eyes:  Negative for visual disturbance.  Respiratory:  Negative for chest tightness, shortness of breath and wheezing.   Cardiovascular:  Negative for chest pain.  Gastrointestinal:  Positive for abdominal pain. Negative for constipation, diarrhea and nausea.       Reflux  Psychiatric/Behavioral:  Positive for depression. Negative for dysphoric mood and sleep disturbance. The patient is not nervous/anxious.      Lab Results  Component Value Date   NA 136 03/11/2023   K 4.1 03/11/2023   CO2 27 03/11/2023   GLUCOSE 123 (H) 03/11/2023   BUN 26 (H) 03/11/2023   CREATININE 1.18 (H) 03/11/2023   CALCIUM 8.8 (L) 03/11/2023   EGFR 48 (L) 01/19/2022   GFRNONAA 45 (L) 03/11/2023   Lab Results  Component Value Date   CHOL 162 10/17/2020   HDL 71 10/17/2020   LDLCALC 80 10/17/2020   TRIG 52 10/17/2020   CHOLHDL 2.3 10/17/2020   Lab  Results  Component Value Date   TSH 1.377 04/04/2022   No results found for: "HGBA1C" Lab Results  Component Value Date   WBC 5.2 03/11/2023   HGB 10.8 (L) 03/11/2023   HCT 32.4 (L) 03/11/2023   MCV 97.3 03/11/2023   PLT 179 03/11/2023   Lab Results  Component Value Date   ALT 15 03/11/2023   AST 18 03/11/2023   ALKPHOS 71 03/11/2023   BILITOT 0.6 03/11/2023   Lab Results  Component Value Date   VD25OH 43.1 06/10/2017     Patient Active Problem List   Diagnosis Date Noted   Stage 3b chronic kidney disease (HCC) 12/17/2022   Calculus of gallbladder with chronic cholecystitis with obstruction 10/11/2022   Protein-calorie malnutrition, moderate (HCC) 10/11/2022   QT prolongation 10/11/2022   History of DVT of lower extremity 04/05/2022   (HFpEF) heart failure with preserved ejection fraction (HCC) 04/05/2022   Atrial fibrillation with RVR (HCC) 04/04/2022   Ganglion cyst of wrist, right 03/20/2019   Iron deficiency anemia    Polyp of sigmoid colon    GERD (gastroesophageal reflux disease) 06/10/2017   Hyperlipidemia 06/08/2016   Scoliosis 06/07/2016   Osteoporosis 06/07/2016   Essential hypertension 06/07/2016   CAD (coronary artery disease) 06/07/2016   Depression, major, single episode, moderate (HCC) 06/07/2016    No Known Allergies  Past Surgical History:  Procedure Laterality Date   ABDOMINAL HYSTERECTOMY     CATARACT EXTRACTION, BILATERAL  2021   COLONOSCOPY WITH PROPOFOL N/A 12/05/2017   Procedure: COLONOSCOPY WITH PROPOFOL;  Surgeon: Midge Minium, MD;  [facility];  Service: Endoscopy;  Laterality: N/A;   ESOPHAGOGASTRODUODENOSCOPY (EGD) WITH PROPOFOL N/A 12/05/2017   Procedure: ESOPHAGOGASTRODUODENOSCOPY (EGD) WITH PROPOFOL;  Surgeon: Midge Minium, MD;  Location[facility];  Service: Endoscopy;  Laterality: N/A;   IR CHOLANGIOGRAM EXISTING TUBE  12/31/2022   IR EXCHANGE BILIARY DRAIN  12/09/2022   IR EXCHANGE BILIARY DRAIN   01/21/2023   IR PERC CHOLECYSTOSTOMY  10/12/2022   IR RADIOLOGIST EVAL & MGMT  12/09/2022   POLYPECTOMY  12/05/2017   Procedure: POLYPECTOMY;  Surgeon: Midge Minium, MD;  [facility];  Service: Endoscopy;;   TONSILLECTOMY      Social History   Tobacco Use   Smoking status: Never    Passive exposure: Never   Smokeless tobacco: Never   Tobacco comments:    smoking cessation materials not required  Vaping Use   Vaping status: Never Used  Substance Use Topics   Alcohol use: No   Drug use: No     Medication list has been reviewed and updated.  Current Meds  Medication Sig   atorvastatin (LIPITOR) 40 MG tablet TAKE 1 TABLET(40 MG) BY MOUTH DAILY   diltiazem (CARDIZEM CD) 240 MG 24 hr capsule TAKE 1 CAPSULE(240 MG) BY MOUTH DAILY   ELIQUIS 2.5 MG TABS tablet TAKE 1 TABLET(2.5 MG) BY MOUTH TWICE DAILY   ENSURE (ENSURE) Take 237 mLs by mouth 2 (two) times daily between meals.   furosemide (LASIX) 20 MG tablet TAKE 1 TABLET(20 MG) BY MOUTH DAILY   gabapentin (NEURONTIN) 100 MG capsule Take 1 capsule (100 mg total) by mouth 3 (three) times daily.   Multiple Vitamins-Calcium (ONE-A-DAY WOMENS PO) Take 1 tablet by mouth daily.   pantoprazole (PROTONIX) 40 MG tablet TAKE 1 TABLET(40 MG) BY MOUTH DAILY   polyethylene glycol (MIRALAX / GLYCOLAX) 17 g packet Take 17 g by mouth daily.   promethazine (PHENERGAN) 12.5 MG tablet Take by mouth.   senna-docusate (SENOKOT-S) 8.6-50 MG tablet Take 1 tablet by mouth at bedtime as needed for mild constipation.   sertraline (ZOLOFT) 50 MG tablet TAKE 1 TABLET(50 MG) BY MOUTH DAILY   traMADol (ULTRAM) 50 MG tablet Take 50 mg by mouth 2 (two) times daily.   Wound Dressings (MEDIHONEY WOUND/BURN DRESSING) GEL Apply topically.   [DISCONTINUED] lisinopril (ZESTRIL) 20 MG tablet TAKE 1 TABLET(20 MG) BY MOUTH DAILY       04/05/2023    1:37 PM 02/22/2022    3:16 PM 01/19/2022    2:18 PM 01/24/2021    2:24 PM  GAD 7 : Generalized Anxiety  Score  Nervous, Anxious, on Edge 3 1 0 1  Control/stop worrying 3 0 0 2  Worry too much - different things 3 0 0 2  Trouble relaxing 0 0 0 0  Restless 0 0 0 0  Easily annoyed or irritable 0 0 0 1  Afraid - awful might happen 0 0 0 0  Total GAD 7 Score 9 1 0 6  Anxiety Difficulty Not difficult at all Not difficult at all Not difficult at all Not difficult at all       04/05/2023    1:37 PM 02/01/2023    3:52 PM 02/22/2022    3:16 PM  Depression screen PHQ 2/9  Decreased Interest 3 1 0  Down, Depressed, Hopeless 3 1 0  PHQ - 2 Score 6  2 0  Altered sleeping 0 1 0  Tired, decreased energy 0 1 0  Change in appetite 2 0 0  Feeling bad or failure about yourself  0 0 0  Trouble concentrating 0 0 0  Moving slowly or fidgety/restless 0 0 0  Suicidal thoughts 0 0 0  PHQ-9 Score 8 4 0  Difficult doing work/chores Not difficult at all Not difficult at all Not difficult at all    BP Readings from Last 3 Encounters:  04/05/23 128/60  03/11/23 124/77  02/11/23 125/71    Physical Exam Vitals and nursing note reviewed.  Constitutional:      General: She is not in acute distress.    Appearance: Normal appearance. She is well-developed.  HENT:     Head: Normocephalic and atraumatic.  Cardiovascular:     Rate and Rhythm: Normal rate and regular rhythm.     Pulses: Normal pulses.  Pulmonary:     Effort: Pulmonary effort is normal. No respiratory distress.     Breath sounds: No wheezing or rhonchi.  Abdominal:     General: Abdomen is flat.     Palpations: Abdomen is soft.     Comments: RUQ tube site with serous drainage.  Musculoskeletal:     Cervical back: Normal range of motion.     Right lower leg: No edema.     Left lower leg: No edema.  Lymphadenopathy:     Cervical: No cervical adenopathy.  Skin:    General: Skin is warm and dry.     Findings: No rash.  Neurological:     Mental Status: She is alert and oriented to person, place, and time. Mental status is at baseline.   Psychiatric:        Mood and Affect: Mood normal.        Behavior: Behavior normal.     Wt Readings from Last 3 Encounters:  04/05/23 109 lb (49.4 kg)  03/11/23 115 lb 1.3 oz (52.2 kg)  02/11/23 109 lb 12.8 oz (49.8 kg)    BP 128/60   Pulse 96   Ht 5' (1.524 m)   Wt 109 lb (49.4 kg)   SpO2 97%   BMI 21.29 kg/m   Assessment and Plan:  Problem List Items Addressed This Visit       Unprioritized   Atrial fibrillation with RVR (HCC)    Rate controlled with Diltiazem On Eliquis for anticoagulation      Relevant Medications   lisinopril (ZESTRIL) 20 MG tablet   Calculus of gallbladder with chronic cholecystitis with obstruction    Cleared by Cardiology for surgery.  Still having pain and drainage from the site. Daughter to call surgeons office ASAP to schedule.      Depression, major, single episode, moderate (HCC) (Chronic)    Clinically stable on Sertraline with good response.  Medications resumed last visit. No SI or HI reported. No change in management at this time.       Essential hypertension - Primary (Chronic)    Controlled BP with normal exam. Current regimen is lisinopril, Diltiazem and lasix. Will continue same medications; encourage continued reduced sodium diet.       Relevant Medications   lisinopril (ZESTRIL) 20 MG tablet   Other Visit Diagnoses     Need for influenza vaccination       Relevant Orders   Flu Vaccine Trivalent High Dose (Fluad) (Completed)       Return in about 6 months (around 10/03/2023) for HTN, Depression.  Reubin Milan, MD [facility] [place]

## 2023-04-05 NOTE — Patient Instructions (Addendum)
Team Member Role and Visual merchandiser Info Address Start End Comments  Henrene Dodge, MD Consulting Physician (General Surgery) Phone: [phone] Fax: [phone] [address] [place] [address] 04/05/2023 - -    Be sure to take Protonix every day.  If you still have heart burn you can take a TUMS or Rolaids.

## 2023-04-05 NOTE — Assessment & Plan Note (Signed)
Clinically stable on Sertraline with good response.  Medications resumed last visit. No SI or HI reported. No change in management at this time.

## 2023-04-05 NOTE — Telephone Encounter (Signed)
Returned call - NA and then left message.

## 2023-04-05 NOTE — Assessment & Plan Note (Signed)
Rate controlled with Diltiazem On Eliquis for anticoagulation

## 2023-04-05 NOTE — Telephone Encounter (Signed)
Patient's daughter Steward Drone, would like Dr. Judithann Graves to give her a call. She would like to inform her of some things that's going on with her mom. Steward Drone stated that she didn't want to speak in front of her mom that's why she is asking for a phone call. [phone]

## 2023-04-05 NOTE — Assessment & Plan Note (Signed)
Controlled BP with normal exam. Current regimen is lisinopril, Diltiazem and lasix. Will continue same medications; encourage continued reduced sodium diet.

## 2023-04-05 NOTE — Assessment & Plan Note (Signed)
Cleared by Cardiology for surgery.  Still having pain and drainage from the site. Daughter to call surgeons office ASAP to schedule.

## 2023-04-24 ENCOUNTER — Other Ambulatory Visit: Payer: Self-pay | Admitting: Internal Medicine

## 2023-04-24 DIAGNOSIS — I10 Essential (primary) hypertension: Secondary | ICD-10-CM

## 2023-04-25 NOTE — Telephone Encounter (Signed)
No longer on current medication list Requested Prescriptions  Pending Prescriptions Disp Refills   amLODipine (NORVASC) 10 MG tablet [Pharmacy Med Name: AMLODIPINE BESYLATE 10MG  TABLETS] 90 tablet 1    Sig: TAKE 1 TABLET(10 MG) BY MOUTH DAILY     Cardiovascular: Calcium Channel Blockers 2 Passed - 04/25/2023  3:16 PM      Passed - Last BP in normal range    BP Readings from Last 1 Encounters:  04/05/23 128/60         Passed - Last Heart Rate in normal range    Pulse Readings from Last 1 Encounters:  04/05/23 96         Passed - Valid encounter within last 6 months    Recent Outpatient Visits           2 weeks ago Essential hypertension   [place] [facility] at [facility], Nyoka Cowden, MD   4 months ago Essential hypertension   [place] [facility] at [facility], Nyoka Cowden, MD   1 year ago Essential hypertension   [place] Primary Care & Sports Medicine at [facility], Nyoka Cowden, MD   1 year ago Acute deep vein thrombosis (DVT) of both peroneal veins [facility])   [place] Primary Care [facility] Sports Medicine at [facility], Nyoka Cowden, MD   2 years ago Essential hypertension   [facility] Primary [facility] at [facility], Nyoka Cowden, MD

## 2023-05-09 ENCOUNTER — Encounter: Payer: Self-pay | Admitting: Surgery

## 2023-05-09 ENCOUNTER — Telehealth: Payer: Self-pay | Admitting: Surgery

## 2023-05-09 ENCOUNTER — Ambulatory Visit (INDEPENDENT_AMBULATORY_CARE_PROVIDER_SITE_OTHER): Payer: Medicare Other | Admitting: Surgery

## 2023-05-09 VITALS — BP 155/70 | HR 87 | Temp 98.0°F | Ht 60.0 in | Wt 115.2 lb

## 2023-05-09 DIAGNOSIS — K810 Acute cholecystitis: Secondary | ICD-10-CM

## 2023-05-09 NOTE — H&P (View-Only) (Signed)
 05/09/2023  History of Present Illness: Vanessa Farmer is a 87 y.o. female presenting for follow up of acute cholecystitis.  She was initially admitted on 10/11/22 with acute cholecystitis and given that she is on Eliquis for atrial fibrillation and DVT history, she had a percutaneous cholecystostomy drain placed on 10/12/22.  There's been issues about getting her to go see her cardiologist and so cardiology clearance was delayed.  She was cleared by cardiology on 02/16/23 at low risk for surgery.  She could hold her Eliquis for 3 days.  She was seen in the ED last on 03/11/23 because her drain became dislodged.  Decision was made not to replace and it's taken some time again to get her back in our office.  Today, she reports that she's been doing better than before without the drain.  The area is still sore and she's had one episode of RUQ pain.  Her main issues more recently is heart burn.  She is gaining weight and eating better.  The drain insertion site still drains and has some itching sensation.  Past Medical History: Past Medical History:  Diagnosis Date   Acute cholecystitis 10/11/2022   Anemia    Cataract    Depression    Fall    GERD (gastroesophageal reflux disease)    Hyperlipidemia    Hypertension    Myocardial infarction [facility])    "mild" many yrs ago     Past Surgical History: Past Surgical History:  Procedure Laterality Date   ABDOMINAL HYSTERECTOMY     CATARACT EXTRACTION, BILATERAL  2021   COLONOSCOPY WITH PROPOFOL N/A 12/05/2017   Procedure: COLONOSCOPY WITH PROPOFOL;  Surgeon: Midge Minium, MD;  Location: [facility];  Service: Endoscopy;  Laterality: N/A;   ESOPHAGOGASTRODUODENOSCOPY (EGD) WITH PROPOFOL N/A 12/05/2017   Procedure: ESOPHAGOGASTRODUODENOSCOPY (EGD) WITH PROPOFOL;  Surgeon: Midge Minium, MD;  Location: [facility];  Service: Endoscopy;  Laterality: N/A;   IR CHOLANGIOGRAM EXISTING TUBE  12/31/2022   IR EXCHANGE BILIARY DRAIN  12/09/2022   IR  EXCHANGE BILIARY DRAIN  01/21/2023   IR PERC CHOLECYSTOSTOMY  10/12/2022   IR RADIOLOGIST EVAL & MGMT  12/09/2022   POLYPECTOMY  12/05/2017   Procedure: POLYPECTOMY;  Surgeon: Midge Minium, MD;  Location: [facility];  Service: Endoscopy;;   TONSILLECTOMY      Home Medications: Prior to Admission medications   Medication Sig Start Date End Date Taking? Authorizing Provider  atorvastatin (LIPITOR) 40 MG tablet TAKE 1 TABLET(40 MG) BY MOUTH DAILY 03/29/23  Yes Reubin Milan, MD  diltiazem (CARDIZEM CD) 240 MG 24 hr capsule TAKE 1 CAPSULE(240 MG) BY MOUTH DAILY 03/29/23  Yes Reubin Milan, MD  ELIQUIS 2.5 MG TABS tablet TAKE 1 TABLET(2.5 MG) BY MOUTH TWICE DAILY 03/29/23  Yes Reubin Milan, MD  furosemide (LASIX) 20 MG tablet TAKE 1 TABLET(20 MG) BY MOUTH DAILY 03/29/23  Yes Reubin Milan, MD  gabapentin (NEURONTIN) 100 MG capsule Take 1 capsule (100 mg total) by mouth 3 (three) times daily. 01/21/23  Yes Marikay Roads, Elita Quick, MD  lisinopril (ZESTRIL) 20 MG tablet Take 1 tablet (20 mg total) by mouth daily. 04/05/23  Yes Reubin Milan, MD  Multiple Vitamins-Calcium (ONE-A-DAY WOMENS PO) Take 1 tablet by mouth daily.   Yes [provider]  pantoprazole (PROTONIX) 40 MG tablet TAKE 1 TABLET(40 MG) BY MOUTH DAILY 03/29/23  Yes Reubin Milan, MD  polyethylene glycol (MIRALAX / GLYCOLAX) 17 g packet Take 17 g by mouth  daily. 10/16/22  Yes Delfino Lovett, MD  promethazine (PHENERGAN) 12.5 MG tablet Take by mouth. 10/22/22  Yes [provider]  senna-docusate (SENOKOT-S) 8.6-50 MG tablet Take 1 tablet by mouth at bedtime as needed for mild constipation. 10/15/22  Yes Delfino Lovett, MD  sertraline (ZOLOFT) 50 MG tablet TAKE 1 TABLET(50 MG) BY MOUTH DAILY 03/29/23  Yes Reubin Milan, MD  traMADol (ULTRAM) 50 MG tablet Take 50 mg by mouth 2 (two) times daily.   Yes [provider]  Wound Dressings (MEDIHONEY WOUND/BURN DRESSING) GEL Apply topically. 10/19/22  Yes  [provider]  ENSURE (ENSURE) Take 237 mLs by mouth 2 (two) times daily between meals. 10/20/17 04/05/23  Reubin Milan, MD    Allergies: No Known Allergies  Review of Systems: Review of Systems  Constitutional:  Negative for chills and fever.  HENT:  Negative for hearing loss.   Respiratory:  Negative for shortness of breath.   Cardiovascular:  Negative for chest pain.  Gastrointestinal:  Positive for abdominal pain and heartburn. Negative for nausea and vomiting.  Genitourinary:  Negative for dysuria.  Musculoskeletal:  Negative for myalgias.  Skin:  Positive for itching and rash.  Neurological:  Negative for dizziness.  Psychiatric/Behavioral:  Negative for depression.     Physical Exam BP (!) 155/70   Pulse 87   Temp 98 F (36.7 C)   Ht 5' (1.524 m)   Wt 115 lb 3.2 oz (52.3 kg)   SpO2 98%   BMI 22.50 kg/m  CONSTITUTIONAL: No acute distress, well-nourished HEENT:  Normocephalic, atraumatic, extraocular motion intact. NECK: Trachea is midline, no jugular venous distention. RESPIRATORY:  Lungs are clear, and breath sounds are equal bilaterally. Normal respiratory effort without pathologic use of accessory muscles. CARDIOVASCULAR: Heart is regular without murmurs, gallops, or rubs. GI: The abdomen is soft, nondistended, with mild discomfort to palpation in the right upper quadrant at the drain insertion site.  The drain insertion site itself has some subcutaneous tissue protruding through which is causing her discomfort.  On palpation of this, this will be oozing so this was treated with silver nitrate.  Dry gauze dressing applied.  MUSCULOSKELETAL: Significant scoliosis.  Uses a walker for ambulation. NEUROLOGIC:  Motor and sensation is grossly normal.  Cranial nerves are grossly intact. PSYCH:  Alert and oriented to person, place and time. Affect is normal.  Labs/Imaging: Labs from 03/11/2023: Sodium 136, potassium 4.1, chloride 99, CO2 27, BUN 26,  creatinine 1.18.  Total bilirubin 0.6, AST 18, ALT 15, albumin 3.2, alkaline phosphatase 71.  WBC 5.2, hemoglobin 10.8, hematocrit 32.4, platelets 179.  CT abdomen/pelvis on 03/11/2023: IMPRESSION: 1. Percutaneous cholecystostomy drain is no longer seen. Focal soft tissue thickening of the abdominal wall at the site of prior cholecystostomy tube insertion may reflect a small hematoma. A 4 mm radiodensity is seen within the presumed tract from the dislodged catheter, likely gallstone. 2. Contracted gallbladder demonstrates cholelithiasis and circumferential mural thickening with trace pericholecystic free fluid. 3. Similar left hiatal hernia containing the majority of the stomach and a loop of nonobstructed transverse colon. 4. Aortic Atherosclerosis (ICD10-I70.0). Coronary artery calcifications. Assessment for potential risk factor modification, dietary therapy or pharmacologic therapy may be warranted, if clinically indicated.  Assessment and Plan: This is a 87 y.o. female with a history of acute cholecystitis, status post percutaneous cholecystostomy drain placement, now since dislodged.  - Discussed with patient that she has cardiology clearance we can proceed with scheduling her for surgery.  This will help  [place] with cholecystectomy as well as management of the drain insertion site to help it heal.  Discussed with her that we can proceed with minimally invasive, robotic assisted cholecystectomy.  She is in agreement and wants to proceed with this plan as we had initially started. - Discussed with her then the plan for robotic assisted cholecystectomy and reviewed the surgery at length with her including the planned incisions, risks of bleeding, infection, injury to surrounding structures, the use of ICG to better evaluate the biliary anatomy, that this would be an outpatient procedure, the potential for a new percutaneous drain, postoperative activity restrictions, pain control, and she is willing to  proceed. - She has been cleared by both her medical and cardiology teams.  Will schedule her for surgery on 05/18/2023.  She currently is on Eliquis which we will hold prior to surgery.  Last dose on 05/15/2023. - All of her questions have been answered.  I spent 40 minutes dedicated to the care of this patient on the date of this encounter to include pre-visit review of records, face-to-face time with the patient discussing diagnosis and management, and any post-visit coordination of care.   Howie Ill, MD [facility]

## 2023-05-09 NOTE — Patient Instructions (Signed)
You will need to hold your Eliquis for 3 days prior to surgery.   You have requested to have your gallbladder removed. This will be done at [facility] with Dr. Aleen Campi.  You will most likely be out of work 1-2 weeks for this surgery.  If you have FMLA or disability paperwork that needs filled out you may drop this off at our office or this can be faxed to [phone].  You will return after your post-op appointment with a lifting restriction for approximately 4 more weeks.  You will be able to eat anything you would like to following surgery. But, start by eating a bland diet and advance this as tolerated. The Gallbladder diet is below, please go as closely by this diet as possible prior to surgery to avoid any further attacks.  Please see the (blue)pre-care form that you have been given today. Our surgery scheduler will call you to verify surgery date and to go over information.   If you have any questions, please call our office.  Laparoscopic Cholecystectomy Laparoscopic cholecystectomy is surgery to remove the gallbladder. The gallbladder is located in the upper right part of the abdomen, behind the liver. It is a storage sac for bile, which is produced in the liver. Bile aids in the digestion and absorption of fats. Cholecystectomy is often done for inflammation of the gallbladder (cholecystitis). This condition is usually caused by a buildup of gallstones (cholelithiasis) in the gallbladder. Gallstones can block the flow of bile, and that can result in inflammation and pain. In severe cases, emergency surgery may be required. If emergency surgery is not required, you will have time to prepare for the procedure. Laparoscopic surgery is an alternative to open surgery. Laparoscopic surgery has a shorter recovery time. Your common bile duct may also need to be examined during the procedure. If stones are found in the common bile duct, they may be removed. LET [facility] CARE PROVIDER  KNOW ABOUT: Any allergies you have. All medicines you are taking, including vitamins, herbs, eye drops, creams, and over-the-counter medicines. Previous problems you or members of your family have had with the use of anesthetics. Any blood disorders you have. Previous surgeries you have had.  Any medical conditions you have. RISKS AND COMPLICATIONS Generally, this is a safe procedure. However, problems may occur, including: Infection. Bleeding. Allergic reactions to medicines. Damage to other structures or organs. A stone remaining in the common bile duct. A bile leak from the cyst duct that is clipped when your gallbladder is removed. The need to convert to open surgery, which requires a larger incision in the abdomen. This may be necessary if your surgeon thinks that it is not safe to continue with a laparoscopic procedure. BEFORE THE PROCEDURE Ask your health care provider about: Changing or stopping your regular medicines. This is especially important if you are taking diabetes medicines or blood thinners. Taking medicines such as aspirin and ibuprofen. These medicines can thin your blood. Do not take these medicines before your procedure if your health care provider instructs you not to. Follow instructions from your health care provider about eating or drinking restrictions. Let your health care provider know if you develop a cold or an infection before surgery. Plan to have someone take you home after the procedure. Ask your health care provider how your surgical site will be marked or identified. You may be given antibiotic medicine to help prevent infection. PROCEDURE To reduce your risk of infection: Your health care team  will wash or sanitize their hands. Your skin will be washed with soap. An IV tube may be inserted into one of your veins. You will be given a medicine to make you fall asleep (general anesthetic). A breathing tube will be placed in your mouth. The surgeon  will make several small cuts (incisions) in your abdomen. A thin, lighted tube (laparoscope) that has a tiny camera on the end will be inserted through one of the small incisions. The camera on the laparoscope will send a picture to a TV screen (monitor) in the operating room. This will give the surgeon a good view inside your abdomen. A gas will be pumped into your abdomen. This will expand your abdomen to give the surgeon more room to perform the surgery. Other tools that are needed for the procedure will be inserted through the other incisions. The gallbladder will be removed through one of the incisions. After your gallbladder has been removed, the incisions will be closed with stitches (sutures), staples, or skin glue. Your incisions may be covered with a bandage (dressing). The procedure may vary among health care providers and hospitals. AFTER THE PROCEDURE Your blood pressure, heart rate, breathing rate, and blood oxygen level will be monitored often until the medicines you were given have worn off. You will be given medicines as needed to control your pain.   This information is not intended to replace advice given to you by your health care provider. Make sure you discuss any questions you have with your health care provider.   Document Released: 04/26/2005 Document Revised: 01/15/2015 Document Reviewed: 12/06/2012 Elsevier Interactive Patient Education 2016 [employer] Inc[employer]   Low-Fat Diet for Gallbladder Conditions A low-fat diet can be helpful if you have pancreatitis or a gallbladder condition. With these conditions, your pancreas and gallbladder have trouble digesting fats. A healthy eating plan with less fat will help rest your pancreas and gallbladder and reduce your symptoms. WHAT DO I NEED TO KNOW ABOUT THIS DIET? Eat a low-fat diet. Reduce your fat intake to less than 20-30% of your total daily calories. This is less than 50-60 g of fat per day. Remember that you need some fat  in your diet. Ask your dietician what your daily goal should be. Choose nonfat and low-fat healthy foods. Look for the words "nonfat," "low fat," or "fat free." As a guide, look on the label and choose foods with less than 3 g of fat per serving. Eat only one serving. Avoid alcohol. Do not smoke. If you need help quitting, talk with your health care provider. Eat small frequent meals instead of three large heavy meals. WHAT FOODS CAN I EAT? Grains Include healthy grains and starches such as potatoes, wheat bread, fiber-rich cereal, and brown rice. Choose whole grain options whenever possible. In adults, whole grains should account for 45-65% of your daily calories.  Fruits and Vegetables Eat plenty of fruits and vegetables. Fresh fruits and vegetables add fiber to your diet. Meats and Other Protein Sources Eat lean meat such as chicken and pork. Trim any fat off of meat before cooking it. Eggs, fish, and beans are other sources of protein. In adults, these foods should account for 10-35% of your daily calories. Dairy Choose low-fat milk and dairy options. Dairy includes fat and protein, as well as calcium.  Fats and Oils Limit high-fat foods such as fried foods, sweets, baked goods, sugary drinks.  Other Creamy sauces and condiments, such as mayonnaise, can add extra fat. Think about whether  or not you need to use them, or use smaller amounts or low fat options. WHAT FOODS ARE NOT RECOMMENDED? High fat foods, such as: [employer]. Ice cream. [place] toast. Sweet rolls. Pizza. Cheese bread. Foods covered with batter, butter, creamy sauces, or cheese. Fried foods. Sugary drinks and desserts. Foods that cause gas or bloating   This information is not intended to replace advice given to you by your health care provider. Make sure you discuss any questions you have with your health care provider.   Document Released: 05/01/2013 Document Reviewed: 05/01/2013 Elsevier Interactive Patient  Education [employer]

## 2023-05-09 NOTE — Telephone Encounter (Signed)
Outgoing call, tried calling daughter, [place] mailbox full, other numbers not available.  If calls back, please inform her of the following regarding scheduled surgery for her mom.   Pre-Admission date/time, and Surgery date at [facility].  Surgery Date: 05/18/23 Preadmission Testing Date: 05/13/23 (phone 8a-1p)  Also they will need to call at [phone], between 1-3:00pm the day before surgery, to find out what time to arrive for surgery.    Also, need to verify health insurance for 2025.

## 2023-05-09 NOTE — Progress Notes (Signed)
05/09/2023  History of Present Illness: Vanessa Farmer is a 87 y.o. female presenting for follow up of acute cholecystitis.  She was initially admitted on 10/11/22 with acute cholecystitis and given that she is on Eliquis for atrial fibrillation and DVT history, she had a percutaneous cholecystostomy drain placed on 10/12/22.  There's been issues about getting her to go see her cardiologist and so cardiology clearance was delayed.  She was cleared by cardiology on 02/16/23 at low risk for surgery.  She could hold her Eliquis for 3 days.  She was seen in the ED last on 03/11/23 because her drain became dislodged.  Decision was made not to replace and it's taken some time again to get her back in our office.  Today, she reports that she's been doing better than before without the drain.  The area is still sore and she's had one episode of RUQ pain.  Her main issues more recently is heart burn.  She is gaining weight and eating better.  The drain insertion site still drains and has some itching sensation.  Past Medical History: Past Medical History:  Diagnosis Date   Acute cholecystitis 10/11/2022   Anemia    Cataract    Depression    Fall    GERD (gastroesophageal reflux disease)    Hyperlipidemia    Hypertension    Myocardial infarction [facility])    "mild" many yrs ago     Past Surgical History: Past Surgical History:  Procedure Laterality Date   ABDOMINAL HYSTERECTOMY     CATARACT EXTRACTION, BILATERAL  2021   COLONOSCOPY WITH PROPOFOL N/A 12/05/2017   Procedure: COLONOSCOPY WITH PROPOFOL;  Surgeon: Midge Minium, MD;  Location: [facility];  Service: Endoscopy;  Laterality: N/A;   ESOPHAGOGASTRODUODENOSCOPY (EGD) WITH PROPOFOL N/A 12/05/2017   Procedure: ESOPHAGOGASTRODUODENOSCOPY (EGD) WITH PROPOFOL;  Surgeon: Midge Minium, MD;  Location: [facility];  Service: Endoscopy;  Laterality: N/A;   IR CHOLANGIOGRAM EXISTING TUBE  12/31/2022   IR EXCHANGE BILIARY DRAIN  12/09/2022   IR  EXCHANGE BILIARY DRAIN  01/21/2023   IR PERC CHOLECYSTOSTOMY  10/12/2022   IR RADIOLOGIST EVAL & MGMT  12/09/2022   POLYPECTOMY  12/05/2017   Procedure: POLYPECTOMY;  Surgeon: Midge Minium, MD;  Location: [facility];  Service: Endoscopy;;   TONSILLECTOMY      Home Medications: Prior to Admission medications   Medication Sig Start Date End Date Taking? Authorizing Provider  atorvastatin (LIPITOR) 40 MG tablet TAKE 1 TABLET(40 MG) BY MOUTH DAILY 03/29/23  Yes Reubin Milan, MD  diltiazem (CARDIZEM CD) 240 MG 24 hr capsule TAKE 1 CAPSULE(240 MG) BY MOUTH DAILY 03/29/23  Yes Reubin Milan, MD  ELIQUIS 2.5 MG TABS tablet TAKE 1 TABLET(2.5 MG) BY MOUTH TWICE DAILY 03/29/23  Yes Reubin Milan, MD  furosemide (LASIX) 20 MG tablet TAKE 1 TABLET(20 MG) BY MOUTH DAILY 03/29/23  Yes Reubin Milan, MD  gabapentin (NEURONTIN) 100 MG capsule Take 1 capsule (100 mg total) by mouth 3 (three) times daily. 01/21/23  Yes Marikay Roads, Elita Quick, MD  lisinopril (ZESTRIL) 20 MG tablet Take 1 tablet (20 mg total) by mouth daily. 04/05/23  Yes Reubin Milan, MD  Multiple Vitamins-Calcium (ONE-A-DAY WOMENS PO) Take 1 tablet by mouth daily.   Yes [provider]  pantoprazole (PROTONIX) 40 MG tablet TAKE 1 TABLET(40 MG) BY MOUTH DAILY 03/29/23  Yes Reubin Milan, MD  polyethylene glycol (MIRALAX / GLYCOLAX) 17 g packet Take 17 g by mouth  daily. 10/16/22  Yes Delfino Lovett, MD  promethazine (PHENERGAN) 12.5 MG tablet Take by mouth. 10/22/22  Yes [provider]  senna-docusate (SENOKOT-S) 8.6-50 MG tablet Take 1 tablet by mouth at bedtime as needed for mild constipation. 10/15/22  Yes Delfino Lovett, MD  sertraline (ZOLOFT) 50 MG tablet TAKE 1 TABLET(50 MG) BY MOUTH DAILY 03/29/23  Yes Reubin Milan, MD  traMADol (ULTRAM) 50 MG tablet Take 50 mg by mouth 2 (two) times daily.   Yes [provider]  Wound Dressings (MEDIHONEY WOUND/BURN DRESSING) GEL Apply topically. 10/19/22  Yes  [provider]  ENSURE (ENSURE) Take 237 mLs by mouth 2 (two) times daily between meals. 10/20/17 04/05/23  Reubin Milan, MD    Allergies: No Known Allergies  Review of Systems: Review of Systems  Constitutional:  Negative for chills and fever.  HENT:  Negative for hearing loss.   Respiratory:  Negative for shortness of breath.   Cardiovascular:  Negative for chest pain.  Gastrointestinal:  Positive for abdominal pain and heartburn. Negative for nausea and vomiting.  Genitourinary:  Negative for dysuria.  Musculoskeletal:  Negative for myalgias.  Skin:  Positive for itching and rash.  Neurological:  Negative for dizziness.  Psychiatric/Behavioral:  Negative for depression.     Physical Exam BP (!) 155/70   Pulse 87   Temp 98 F (36.7 C)   Ht 5' (1.524 m)   Wt 115 lb 3.2 oz (52.3 kg)   SpO2 98%   BMI 22.50 kg/m  CONSTITUTIONAL: No acute distress, well-nourished HEENT:  Normocephalic, atraumatic, extraocular motion intact. NECK: Trachea is midline, no jugular venous distention. RESPIRATORY:  Lungs are clear, and breath sounds are equal bilaterally. Normal respiratory effort without pathologic use of accessory muscles. CARDIOVASCULAR: Heart is regular without murmurs, gallops, or rubs. GI: The abdomen is soft, nondistended, with mild discomfort to palpation in the right upper quadrant at the drain insertion site.  The drain insertion site itself has some subcutaneous tissue protruding through which is causing her discomfort.  On palpation of this, this will be oozing so this was treated with silver nitrate.  Dry gauze dressing applied.  MUSCULOSKELETAL: Significant scoliosis.  Uses a walker for ambulation. NEUROLOGIC:  Motor and sensation is grossly normal.  Cranial nerves are grossly intact. PSYCH:  Alert and oriented to person, place and time. Affect is normal.  Labs/Imaging: Labs from 03/11/2023: Sodium 136, potassium 4.1, chloride 99, CO2 27, BUN 26,  creatinine 1.18.  Total bilirubin 0.6, AST 18, ALT 15, albumin 3.2, alkaline phosphatase 71.  WBC 5.2, hemoglobin 10.8, hematocrit 32.4, platelets 179.  CT abdomen/pelvis on 03/11/2023: IMPRESSION: 1. Percutaneous cholecystostomy drain is no longer seen. Focal soft tissue thickening of the abdominal wall at the site of prior cholecystostomy tube insertion may reflect a small hematoma. A 4 mm radiodensity is seen within the presumed tract from the dislodged catheter, likely gallstone. 2. Contracted gallbladder demonstrates cholelithiasis and circumferential mural thickening with trace pericholecystic free fluid. 3. Similar left hiatal hernia containing the majority of the stomach and a loop of nonobstructed transverse colon. 4. Aortic Atherosclerosis (ICD10-I70.0). Coronary artery calcifications. Assessment for potential risk factor modification, dietary therapy or pharmacologic therapy may be warranted, if clinically indicated.  Assessment and Plan: This is a 87 y.o. female with a history of acute cholecystitis, status post percutaneous cholecystostomy drain placement, now since dislodged.  - Discussed with patient that she has cardiology clearance we can proceed with scheduling her for surgery.  This will help  [place] with cholecystectomy as well as management of the drain insertion site to help it heal.  Discussed with her that we can proceed with minimally invasive, robotic assisted cholecystectomy.  She is in agreement and wants to proceed with this plan as we had initially started. - Discussed with her then the plan for robotic assisted cholecystectomy and reviewed the surgery at length with her including the planned incisions, risks of bleeding, infection, injury to surrounding structures, the use of ICG to better evaluate the biliary anatomy, that this would be an outpatient procedure, the potential for a new percutaneous drain, postoperative activity restrictions, pain control, and she is willing to  proceed. - She has been cleared by both her medical and cardiology teams.  Will schedule her for surgery on 05/18/2023.  She currently is on Eliquis which we will hold prior to surgery.  Last dose on 05/15/2023. - All of her questions have been answered.  I spent 40 minutes dedicated to the care of this patient on the date of this encounter to include pre-visit review of records, face-to-face time with the patient discussing diagnosis and management, and any post-visit coordination of care.   Howie Ill, MD [facility]

## 2023-05-13 ENCOUNTER — Telehealth: Payer: Self-pay | Admitting: Surgery

## 2023-05-13 ENCOUNTER — Encounter
Admission: RE | Admit: 2023-05-13 | Discharge: 2023-05-13 | Disposition: A | Discharge status: Home / Self Care | Payer: Medicare Other | Source: Ambulatory Visit | Attending: Surgery

## 2023-05-13 ENCOUNTER — Other Ambulatory Visit: Payer: Self-pay

## 2023-05-13 NOTE — Progress Notes (Signed)
 Progress Notes - documented in this encounter  Florencio Cara Endow, MD - 02/16/2023 10:45 AM EDT Formatting of this note is different from the original. Established Patient Visit  Chief Complaint: Chief Complaint Patient presents with Follow-up poc Shortness of Breath Date of Service: 02/16/2023 Date of Birth: 19-May-1935 PCP: Leita Nani History of Present Illness: [name] is a 88 y.o.female patient who preop for gallbladder surgery patient appears to be at moderate risk is on Eliquis  will have to be held 5 days prior to procedure she is on chronic pain and is asked for some pain medication of breath symptoms she has history of atrial fibrillation chronic renal sufficiency DVT coronary disease hypertension GERD stable otherwise for preop for gallbladder surgery she has had significant pain  Past Medical and Surgical History Past Medical History No past medical history on file.  Past Surgical History She has no past surgical history on file.  Medications and Allergies Current Medications  Current Outpatient Medications Medication Sig Dispense Refill amLODIPine  (NORVASC ) 10 MG tablet Take 10 mg by mouth once daily apixaban  (ELIQUIS ) 2.5 mg tablet Take 2.5 mg by mouth 2 (two) times daily atorvastatin  (LIPITOR) 40 MG tablet Take 40 mg by mouth once daily dilTIAZem  (CARDIZEM  CD) 240 MG CD capsule Take 1 capsule by mouth once daily FUROsemide  (LASIX ) 20 MG tablet Take 1 tablet by mouth twice a week gabapentin  (NEURONTIN ) 100 MG capsule Take 100 mg by mouth 3 (three) times daily lisinopriL  (ZESTRIL ) 20 MG tablet Take 20 mg by mouth once daily multivit-minerals/folic acid  (MULTIVITAMIN GUMMIES ORAL) Take 1 tablet by mouth once daily pantoprazole  (PROTONIX ) 40 MG DR tablet Take 40 mg by mouth once daily sertraline  (ZOLOFT ) 50 MG tablet Take 50 mg by mouth once daily traMADoL  (ULTRAM ) 50 mg tablet Take 1 tablet (50 mg total) by mouth 2 (two) times daily 60 tablet 1 albuterol   MDI, PROVENTIL , VENTOLIN , PROAIR , HFA 90 mcg/actuation inhaler Inhale 2 inhalations into the lungs (Patient not taking: Reported on 11/15/2022) carvediloL (COREG) 25 MG tablet Take 25 mg by mouth 2 (two) times daily with meals (Patient not taking: Reported on 11/15/2022) cetirizine (ZYRTEC) 10 mg capsule Take 10 mg by mouth once daily (Patient not taking: Reported on 11/15/2022) guaiFENesin  (MUCINEX ) 600 mg SR tablet Take 600 mg by mouth 2 (two) times daily (Patient not taking: Reported on 11/15/2022) lidocaine  (LIDODERM ) 5 % patch Place 1 patch onto the skin daily Apply patch to the most painful area for up to 12 hours in a 24 hour period. (Patient not taking: Reported on 02/16/2023) losartan (COZAAR) 100 MG tablet Take 100 mg by mouth once daily (Patient not taking: Reported on 11/15/2022) naloxone (NARCAN) 4 mg/actuation nasal spray Place 4 mg into one nostril once as needed (Patient not taking: Reported on 11/15/2022) oxyCODONE -acetaminophen  (PERCOCET) 5-325 mg tablet Take 1 tablet by mouth every 4 (four) hours as needed (Patient not taking: Reported on 02/16/2023) polyethylene glycol (MIRALAX ) packet Take 17 g by mouth once daily (Patient not taking: Reported on 02/16/2023) promethazine (PHENERGAN) 12.5 MG tablet Take by mouth (Patient not taking: Reported on 02/16/2023) sennosides-docusate (SENOKOT-S) 8.6-50 mg tablet Take 1 tablet by mouth once daily (Patient not taking: Reported on 02/16/2023) spironolactone (ALDACTONE) 25 MG tablet Take 25 mg by mouth once daily (Patient not taking: Reported on 11/15/2022) tamsulosin (FLOMAX) 0.4 mg capsule Take 0.4 mg by mouth once daily Take 30 minutes after same meal each day. (Patient not taking: Reported on 11/15/2022)  No current facility-administered medications for this visit.  Allergies: Patient has no known allergies.  Social and Family History Social History reports that she has never smoked. She has never used smokeless tobacco.  Family History No family  history on file.  Review of Systems  Review of Systems: The patient denies chest pain, shortness of breath, orthopnea, paroxysmal nocturnal dyspnea, pedal edema, palpitations, heart racing, presyncope, syncope. Review of 12 Systems is negative except as described above.  Physical Examination  Vitals:BP 126/64 (BP Location: Left upper arm, Patient Position: Sitting, BP Cuff Size: Adult)  Pulse 94  Resp 14  Ht 157.5 cm (5' 2)  Wt 49.4 kg (109 lb)  LMP (LMP Unknown)  SpO2 95%  BMI 19.94 kg/m Ht:157.5 cm (5' 2) Wt:49.4 kg (109 lb) ADJ:Anib surface area is 1.47 meters squared. Body mass index is 19.94 kg/m.  HEENT: Pupils equally reactive to light and accomodation Neck: Supple without thyromegaly, carotid pulses 2+ Lungs: clear to auscultation bilaterally; no wheezes, rales, rhonchi Heart: Regular rate and rhythm. No gallops, murmurs or rub Abdomen: soft nontender, nondistended, with normal bowel sounds Extremities: no cyanosis, clubbing, or edema Peripheral Pulses: 2+ in all extremities, 2+ femoral pulses bilaterally Neurologic: Alert and oriented X3; speech intact; face symmetrical; moves all extremities well  Assessment  88 y.o. female with 1. Atrial fibrillation with RVR (CMS/HHS-HCC) 2. Coronary artery disease involving native coronary artery of native heart without angina pectoris 3. Aortic atherosclerosis (CMS-HCC) 4. Heart failure with preserved ejection fraction, unspecified HF chronicity (CMS/HHS-HCC) 5. Hyperlipidemia, unspecified hyperlipidemia type 6. Gastroesophageal reflux disease, unspecified whether esophagitis present 7. Essential hypertension 8. History of DVT of lower extremity 9. Chest pain, unspecified type 10. Stage 3a chronic kidney disease (CMS/HHS-HCC)  Plan Preoperative clearance for gallbladder surgery is a moderate risk we will have the patient hold Eliquis  5 days prior to procedure Add tramadol  low-dose to help with pain management and  control Atrial fibrillation paroxysmal reasonably stable currently on Eliquis  for anticoagulation diltiazem  Coreg for rate Coronary artery disease reasonably stable on amlodipine  statin diltiazem  lisinopril  coronary spironolactone Chronic congestive heart failure with preserved left ventricular function Hyperlipidemia hyperlipidemia management with Lipitor therapy diet and exercise Hypertension hypertension control amlodipine  diltiazem  losartan Coreg spironolactone History of DVT continue anticoagulation with Eliquis  Chest pain possible anginal type symptoms maintain calcium  blocker statin beta-blocker consider adding nitrates Chronic renal insufficiency maintain adequate hydration follow-up with nephrology Have the patient follow-up in 6 months  Return in about 3 months (around 05/19/2023).  DWAYNE D CALLWOOD, MD  This dictation was prepared with dragon dictation. Any transcription errors that result from this process are unintentional.  Electronically signed by Florencio Cara Endow, MD at 02/20/2023 8:07 PM EDT

## 2023-05-13 NOTE — Progress Notes (Signed)
 Cardiac clearance in Care Everywhere from 10-9-4 from Dr Juliann Pares (copy and pasted clearance note into epic)-Moderate Risk

## 2023-05-13 NOTE — Patient Instructions (Addendum)
 Your procedure is scheduled on: Wednesday 05/18/23 To find out your arrival time, please call [phone] between 1PM - 3PM on:  Tuesday 05/17/23  Report to the Registration Desk on the 1st floor of the [facility]. FREE Valet parking is available.  If your arrival time is 6:00 am, do not arrive before that time as the [facility] entrance doors do not open until 6:00 am.  REMEMBER: Instructions that are not followed completely may result in serious medical risk, up to and including death; or upon the discretion of your surgeon and anesthesiologist your surgery may need to be rescheduled.  Do not eat food after midnight the night before surgery.  No gum chewing or hard candies.  You may however, drink CLEAR liquids up to 2 hours before you are scheduled to arrive for your surgery. Do not drink anything within 2 hours of your scheduled arrival time.  Clear liquids include: - water   - apple juice without pulp - gatorade (not RED colors) - black coffee or tea (Do NOT add milk or creamers to the coffee or tea) Do NOT drink anything that is not on this list.  Type 1 and Type 2 diabetics should only drink water .  One week prior to surgery: Stop Anti-inflammatories (NSAIDS) such as Advil , Aleve, Ibuprofen , Motrin , Naproxen, Naprosyn and Aspirin  based products such as Excedrin, Goody's Powder, BC Powder. You may however, continue to take Tylenol  if needed for pain up until the day of surgery.  Stop ANY OVER THE COUNTER supplements and vitamins for 7 days until after surgery.  Continue taking all prescribed medications with the exception of the following: Eliquis  last dose 05/15/23 per Dr Desiderio (will likely restart after surgery)  TAKE ONLY THESE MEDICATIONS THE MORNING OF SURGERY WITH A SIP OF WATER :  diltiazem  (CARDIZEM  CD) 240 MG 24 hr capsule  pantoprazole  (PROTONIX ) 40 MG tablet Antacid (take one the night before and one on the morning of surgery - helps to prevent nausea after  surgery.) sertraline  (ZOLOFT ) 50 MG tablet   No Alcohol for 24 hours before or after surgery.  No Smoking including e-cigarettes for 24 hours before surgery.  No chewable tobacco products for at least 6 hours before surgery.  No nicotine patches on the day of surgery.  Do not use any recreational drugs for at least a week (preferably 2 weeks) before your surgery.  Please be advised that the combination of cocaine and anesthesia may have negative outcomes, up to and including death. If you test positive for cocaine, your surgery will be cancelled.  On the morning of surgery brush your teeth with toothpaste and water , you may rinse your mouth with mouthwash if you wish. Do not swallow any toothpaste or mouthwash.  Use CHG Soap or wipes as directed on instruction sheet.  Do not wear lotions, powders, or perfumes.   Do not shave body hair from the neck down 48 hours before surgery.  Wear clean comfortable clothing (specific to your surgery type) to the hospital.  Do not wear jewelry, make-up, hairpins, clips or nail polish.  For welded (permanent) jewelry: bracelets, anklets, waist bands, etc.  Please have this removed prior to surgery.  If it is not removed, there is a chance that hospital personnel will need to cut it off on the day of surgery. Contact lenses, hearing aids and dentures may not be worn into surgery.  Do not bring valuables to the hospital. [facility] is not responsible for any missing/lost belongings or valuables.  Notify your doctor if there is any change in your medical condition (cold, fever, infection).  If you are being discharged the day of surgery, you will not be allowed to drive home. You will need a responsible individual to drive you home and stay with you for 24 hours after surgery.   If you are taking public transportation, you will need to have a responsible individual with you.  If you are being admitted to the hospital overnight, leave your  suitcase in the car. After surgery it may be brought to your room.  In case of increased patient census, it may be necessary for you, the patient, to continue your postoperative care in the Same Day Surgery department.  After surgery, you can help prevent lung complications by doing breathing exercises.  Take deep breaths and cough every 1-2 hours. Your doctor may order a device called an Incentive Spirometer to help you take deep breaths. When coughing or sneezing, hold a pillow firmly against your incision with both hands. This is called "splinting." Doing this helps protect your incision. It also decreases belly discomfort.  Surgery Visitation Policy:  Patients undergoing a surgery or procedure may have two family members or support persons with them as long as the person is not COVID-19 positive or experiencing its symptoms.   Inpatient Visitation:    Visiting hours are 7 a.m. to 8 p.m. Up to four visitors are allowed at one time in a patient room. The visitors may rotate out with other people during the day. One designated support person (adult) may remain overnight.  Please call the Pre-admissions Testing Dept. at [phone] if you have any questions about these instructions.     Preparing for Surgery with CHLORHEXIDINE  GLUCONATE (CHG) Soap  Chlorhexidine  Gluconate (CHG) Soap  o An antiseptic cleaner that kills germs and bonds with the skin to continue killing germs even after washing  o Used for showering the night before surgery and morning of surgery  Before surgery, you can play an important role by reducing the number of germs on your skin.  CHG (Chlorhexidine  gluconate) soap is an antiseptic cleanser which kills germs and bonds with the skin to continue killing germs even after washing.  Please do not use if you have an allergy to CHG or antibacterial soaps. If your skin becomes reddened/irritated stop using the CHG.  1. Shower the NIGHT BEFORE SURGERY and the  MORNING OF SURGERY with CHG soap.  2. If you choose to wash your hair, wash your hair first as usual with your normal shampoo.  3. After shampooing, rinse your hair and body thoroughly to remove the shampoo.  4. Use CHG as you would any other liquid soap. You can apply CHG directly to the skin and wash gently with a scrungie or a clean washcloth.  5. Apply the CHG soap to your body only from the neck down. Do not use on open wounds or open sores. Avoid contact with your eyes, ears, mouth, and genitals (private parts). Wash face and genitals (private parts) with your normal soap.  6. Wash thoroughly, paying special attention to the area where your surgery will be performed.  7. Thoroughly rinse your body with warm water .  8. Do not shower/wash with your normal soap after using and rinsing off the CHG soap.  9. Pat yourself dry with a clean towel.  10. Wear clean pajamas to bed the night before surgery.  12. Place clean sheets on your bed the night of your  first shower and do not sleep with pets.  13. Shower again with the CHG soap on the day of surgery prior to arriving at the hospital.  14. Do not apply any deodorants/lotions/powders.  15. Please wear clean clothes to the hospital.

## 2023-05-13 NOTE — Telephone Encounter (Signed)
 Multiple attempts have been made to contact daughter, Steward Drone regarding her mom's surgery.  All numbers in chart, either mailbox full or # to try again later.  Unable to leave message.

## 2023-05-18 ENCOUNTER — Telehealth: Payer: Self-pay | Admitting: Surgery

## 2023-05-18 NOTE — Telephone Encounter (Signed)
 Spoke with daughter, [name] she is now informed of all dates regarding scheduled surgery for her mom regarding surgery with Dr. Desiderio.    Patient has been advised of Pre-Admission date/time, and Surgery date at [facility].  Surgery Date: 05/31/23 Preadmission Testing Date: 05/23/23 (phone 1p-4p)  Patient has been made aware to call [phone], between 1-3:00pm the day before surgery, to find out what time to arrive for surgery.

## 2023-05-23 ENCOUNTER — Inpatient Hospital Stay: Admission: RE | Admit: 2023-05-23 | Payer: Medicare Other | Source: Ambulatory Visit

## 2023-05-27 ENCOUNTER — Encounter
Admission: RE | Admit: 2023-05-27 | Discharge: 2023-05-27 | Disposition: A | Discharge status: Home / Self Care | Payer: Medicare Other | Source: Ambulatory Visit | Attending: Surgery | Admitting: Surgery

## 2023-05-27 VITALS — Ht 60.0 in | Wt 119.0 lb

## 2023-05-27 DIAGNOSIS — Z01810 Encounter for preprocedural cardiovascular examination: Secondary | ICD-10-CM

## 2023-05-27 DIAGNOSIS — Z79899 Other long term (current) drug therapy: Secondary | ICD-10-CM

## 2023-05-27 DIAGNOSIS — I5030 Unspecified diastolic (congestive) heart failure: Secondary | ICD-10-CM

## 2023-05-27 DIAGNOSIS — Z01812 Encounter for preprocedural laboratory examination: Secondary | ICD-10-CM

## 2023-05-27 DIAGNOSIS — I4891 Unspecified atrial fibrillation: Secondary | ICD-10-CM

## 2023-05-27 DIAGNOSIS — I10 Essential (primary) hypertension: Secondary | ICD-10-CM

## 2023-05-27 HISTORY — DX: Polyp of colon: K63.5

## 2023-05-27 HISTORY — DX: Long term (current) use of anticoagulants: Z79.01

## 2023-05-27 HISTORY — DX: Personal history of other venous thrombosis and embolism: Z86.718

## 2023-05-27 HISTORY — DX: Chronic obstructive pulmonary disease, unspecified: J44.9

## 2023-05-27 HISTORY — DX: Unspecified asthma, uncomplicated: J45.909

## 2023-05-27 HISTORY — DX: Unspecified diastolic (congestive) heart failure: I50.30

## 2023-05-27 HISTORY — DX: Chronic kidney disease, stage 3b: N18.32

## 2023-05-27 HISTORY — DX: Abnormal electrocardiogram (ECG) (EKG): R94.31

## 2023-05-27 HISTORY — DX: Atherosclerotic heart disease of native coronary artery without angina pectoris: I25.10

## 2023-05-27 HISTORY — DX: Moderate protein-calorie malnutrition: E44.0

## 2023-05-27 HISTORY — DX: Scoliosis, unspecified: M41.9

## 2023-05-27 HISTORY — DX: Unspecified atrial fibrillation: I48.91

## 2023-05-27 NOTE — Progress Notes (Signed)
Spoke with pts daughter to do anesthesia interview for upcoming surgery on 05-31-23. I asked daughter why pts surgery got cancelled on 1-8 and then rescheduled. She said her mom came down with GI bug (n/v, diarrhea, chills, fever). Pt's daughter states pt is much better now.

## 2023-05-27 NOTE — Patient Instructions (Addendum)
Your procedure is scheduled on:05-31-23 Tuesday Report to the Registration Desk on the 1st floor of the [facility].Then proceed to the 2nd floor Surgery Desk To find out your arrival time, please call [phone] between 1PM - 3PM on:05-30-23 Monday If your arrival time is 6:00 am, do not arrive before that time as the [facility] entrance doors do not open until 6:00 am.  REMEMBER: Instructions that are not followed completely may result in serious medical risk, up to and including death; or upon the discretion of your surgeon and anesthesiologist your surgery may need to be rescheduled.  Do not eat food after midnight the night before surgery.  No gum chewing or hard candies.  You may however, drink CLEAR liquids up to 2 hours before you are scheduled to arrive for your surgery. Do not drink anything within 2 hours of your scheduled arrival time.  Clear liquids include: - water  - apple juice without pulp - gatorade (not RED colors) - black coffee or tea (Do NOT add milk or creamers to the coffee or tea) Do NOT drink anything that is not on this list.  One week prior to surgery:Stop NOW (05-27-23) Stop Anti-inflammatories (NSAIDS) such as Advil, Aleve, Ibuprofen, Motrin, Naproxen, Naprosyn and Aspirin based products such as Excedrin, Goody's Powder, BC Powder. Stop ANY OVER THE COUNTER supplements until after surgery (Multivitamin)  You may however, continue to take Tylenol if needed for pain up until the day of surgery.  Stop ELIQUIS 3 days prior to surgery-Last dose will be on 05-27-23  Continue taking all of your other prescription medications up until the day of surgery.  ON THE DAY OF SURGERY ONLY TAKE THESE MEDICATIONS WITH SIPS OF WATER: -diltiazem (CARDIZEM CD)   No Alcohol for 24 hours before or after surgery.  No Smoking including e-cigarettes for 24 hours before surgery.  No chewable tobacco products for at least 6 hours before surgery.  No nicotine patches on the  day of surgery.  Do not use any "recreational" drugs for at least a week (preferably 2 weeks) before your surgery.  Please be advised that the combination of cocaine and anesthesia may have negative outcomes, up to and including death. If you test positive for cocaine, your surgery will be cancelled.  On the morning of surgery brush your teeth with toothpaste and water, you may rinse your mouth with mouthwash if you wish. Do not swallow any toothpaste or mouthwash.  Use CHG wipes as directed on instruction sheet.  Do not wear jewelry, make-up, hairpins, clips or nail polish.  For welded (permanent) jewelry: bracelets, anklets, waist bands, etc.  Please have this removed prior to surgery.  If it is not removed, there is a chance that hospital personnel will need to cut it off on the day of surgery.  Do not wear lotions, powders, or perfumes.   Do not shave body hair from the neck down 48 hours before surgery.  Contact lenses, hearing aids and dentures may not be worn into surgery.  Do not bring valuables to the hospital. [facility] is not responsible for any missing/lost belongings or valuables.   Notify your doctor if there is any change in your medical condition (cold, fever, infection).  Wear comfortable clothing (specific to your surgery type) to the hospital.  After surgery, you can help prevent lung complications by doing breathing exercises.  Take deep breaths and cough every 1-2 hours. Your doctor may order a device called an Incentive Spirometer to help  you take deep breaths. When coughing or sneezing, hold a pillow firmly against your incision with both hands. This is called "splinting." Doing this helps protect your incision. It also decreases belly discomfort.  If you are being admitted to the hospital overnight, leave your suitcase in the car. After surgery it may be brought to your room.  In case of increased patient census, it may be necessary for you, the patient, to  continue your postoperative care in the Same Day Surgery department.  If you are being discharged the day of surgery, you will not be allowed to drive home. You will need a responsible individual to drive you home and stay with you for 24 hours after surgery.   If you are taking public transportation, you will need to have a responsible individual with you.  Please call the Pre-admissions Testing Dept. at [phone] if you have any questions about these instructions.  Surgery Visitation Policy:  Patients having surgery or a procedure may have two visitors.  Children under the age of 24 must have an adult with them who is not the patient.  Temporary Visitor Restrictions Due to increasing cases of flu, RSV and COVID-19: Children ages 68 and under will not be able to visit patients in [facility] hospitals under most circumstances.  Preparing the Skin Before Surgery     To help prevent the risk of infection at your surgical site, we are now providing you with rinse-free Sage 2% Chlorhexidine Gluconate (CHG) disposable wipes.  Chlorhexidine Gluconate (CHG) Soap  o An antiseptic cleaner that kills germs and bonds with the skin to continue killing germs even after washing  o Used for showering the night before surgery and morning of surgery  The night before surgery: Shower or bathe with warm water. Do not apply perfume, lotions, powders. Wait one hour after shower. Skin should be dry and cool. Open Sage wipe package - use 6 disposable cloths. Wipe body using one cloth for the right arm, one cloth for the left arm, one cloth for the right leg, one cloth for the left leg, one cloth for the chest/abdomen area, and one cloth for the back. Do not use on open wounds or sores. Do not use on face or genitals (private parts). If you are breast feeding, do not use on breasts. 5. Do not rinse, allow to dry. 6. Skin may feel "tacky" for several minutes. 7. Dress in clean clothes. 8.  Place clean sheets on your bed and do not sleep with pets.  REPEAT ABOVE ON THE MORNING OF SURGERY BEFORE ARRIVING TO THE HOSPITAL.

## 2023-05-30 ENCOUNTER — Encounter: Payer: Self-pay | Admitting: Surgery

## 2023-05-30 ENCOUNTER — Encounter
Admission: RE | Admit: 2023-05-30 | Discharge: 2023-05-30 | Disposition: A | Discharge status: Home / Self Care | Payer: Medicare Other | Source: Ambulatory Visit | Attending: Surgery | Admitting: Surgery

## 2023-05-30 DIAGNOSIS — Z01810 Encounter for preprocedural cardiovascular examination: Secondary | ICD-10-CM

## 2023-05-30 DIAGNOSIS — Z79899 Other long term (current) drug therapy: Secondary | ICD-10-CM

## 2023-05-30 DIAGNOSIS — Z01818 Encounter for other preprocedural examination: Secondary | ICD-10-CM | POA: Diagnosis not present

## 2023-05-30 DIAGNOSIS — I10 Essential (primary) hypertension: Secondary | ICD-10-CM | POA: Diagnosis not present

## 2023-05-30 DIAGNOSIS — I4891 Unspecified atrial fibrillation: Secondary | ICD-10-CM | POA: Insufficient documentation

## 2023-05-30 DIAGNOSIS — Z01812 Encounter for preprocedural laboratory examination: Secondary | ICD-10-CM

## 2023-05-30 DIAGNOSIS — I5030 Unspecified diastolic (congestive) heart failure: Secondary | ICD-10-CM | POA: Diagnosis not present

## 2023-05-30 LAB — BASIC METABOLIC PANEL
Anion gap: 11 (ref 5–15)
BUN: 34 mg/dL — ABNORMAL HIGH (ref 8–23)
CO2: 28 mmol/L (ref 22–32)
Calcium: 9.1 mg/dL (ref 8.9–10.3)
Chloride: 101 mmol/L (ref 98–111)
Creatinine, Ser: 1.31 mg/dL — ABNORMAL HIGH (ref 0.44–1.00)
GFR, Estimated: 40 mL/min — ABNORMAL LOW (ref >60)
Glucose, Bld: 94 mg/dL (ref 70–99)
Potassium: 4.2 mmol/L (ref 3.5–5.1)
Sodium: 140 mmol/L (ref 135–145)

## 2023-05-30 NOTE — Progress Notes (Signed)
Perioperative / Anesthesia Services  Pre-Admission Testing Clinical Review / Pre-Operative Anesthesia Consult  Date: 05/30/23  Patient Demographics:  Name: Vanessa Farmer DOB: [birth year] MRN:   [id number]  Planned Surgical Procedure(s):    Case: [id number] Date/Time: 05/31/23 0715   Procedures:      XI ROBOTIC ASSISTED LAPAROSCOPIC CHOLECYSTECTOMY     INDOCYANINE GREEN FLUORESCENCE IMAGING (ICG)   Anesthesia type: General   Pre-op diagnosis: cholecystitis   Location: [facility] OR ROOM 07 / [facility]   Surgeons: Henrene Dodge, MD      NOTE: Available PAT nursing documentation and vital signs have been reviewed. Clinical nursing staff has updated patient's PMH/PSHx, current medication list, and drug allergies/intolerances to ensure comprehensive history available to assist in medical decision making as it pertains to the aforementioned surgical procedure and anticipated anesthetic course. Extensive review of available clinical information personally performed. Vanessa Farmer PMH and PSHx updated with any diagnoses/procedures that  may have been inadvertently omitted during his intake with the pre-admission testing department's nursing staff.  Clinical Discussion:  Vanessa Farmer is a 88 y.o. female who is submitted for pre-surgical anesthesia review and clearance prior to her undergoing the above procedure. Patient has never been a smoker in the past. Pertinent PMH includes: CAD, MI, atrial fibrillation with RVR, HFpEF, QTc prolongation, angina, aortic atherosclerosis, DVT, HTN, HLD, CKD-III, asthma, COPD, GERD (no daily Tx), hiatal hernia, anemia, protein-calorie malnutrition, scoliosis, depression.   Patient is followed by cardiology Juliann Pares, MD). She was last seen in the cardiology clinic on 02/16/2023; notes reviewed. At the time of her clinic visit, patient doing well overall from a cardiovascular perspective. Patient denied any chest pain, shortness of breath, PND,  orthopnea, palpitations, significant peripheral edema, weakness, fatigue, vertiginous symptoms, or presyncope/syncope. Patient with a past medical history significant for cardiovascular diagnoses. Documented physical exam was grossly benign, providing no evidence of acute exacerbation and/or decompensation of the patient's known cardiovascular conditions.  TTE performed on 12/30/2021 revealed normal left ventricular systolic function with an EF of 55-60%. There were no regional wall motion abnormalities. Moderate LVH observed. Left ventricular diastolic Doppler parameters consistent with abnormal relaxation (G1DD). Right ventricular size and function normal; RVSP 19.9 mmHg. Left atrium moderated dilated. There was mild mitral valve regurgitation. Moderate mitral annular calcification. Aortic valve sclerotic. All transvalvular gradients were noted to be normal providing no evidence suggestive of valvular stenosis. Aorta normal in size with no evidence of ectasia or aneurysmal dilatation.  Patient with an atrial fibrillation diagnosis; CHA2DS2-VASc Score = 8 ((age x 2, sex, HFpEF, HTN, DVT x 2, vascular disease history ). Her rate and rhythm are currently being maintained on oral diltiazem. She is chronically anticoagulated using dose reduced apixaban; reported to be compliant with therapy with no evidence or reports of GI/GU bleeding.  Blood pressure well controlled at 126/64 mmHg on currently prescribed diuretic (furosemide) and ACEi (lisinopril) therapies. She is on atorvastatin for her HLD diagnosis and further ASCVD prevention. Patient is not diabetic. Patient does not have an OSAH diagnosis. Functional capacity is somewhat limited by patient's age and multiple medical comorbidities.  With that said, patient is  able to complete all of her ADL/IADLs without cardiovascular limitation.  Per the DASI, patient is able to achieve at least 4 METS of physical activity without experiencing any significant degree of  angina/anginal equivalent symptoms. No changes were made to her medication regimen during her visit with cardiology.  Patient scheduled to follow-up with outpatient cardiology in 3 months  or sooner if needed.  Vanessa Farmer is scheduled for an elective XI ROBOTIC ASSISTED LAPAROSCOPIC CHOLECYSTECTOMY on 05/31/2023 with Dr. Henrene Dodge, MD. Given patient's past medical history significant for cardiovascular diagnoses, presurgical cardiac clearance was sought by the PAT team. Per cardiology, "this patient is optimized for surgery and may proceed with the planned procedural course with a MODERATE risk of significant perioperative cardiovascular complications".  Again, this patient is on daily oral anticoagulation therapy using a DOAC medication.  She has been instructed on recommendations for holding her apixaban for 3 days prior to her procedure with plans to restart as soon as postoperative bleeding risk felt to be minimized by his attending surgeon. The patient has been instructed that her last dose of apixaban should be on 05/27/2023.  Patient denies previous perioperative complications with anesthesia in the past. In review her EMR, it is noted that patient underwent a general anesthetic course at [facility] in 11/2017 without documented complications.      05/27/2023    8:00 AM 05/13/2023    8:47 AM 05/09/2023    9:37 AM  Vitals with BMI  Height 5\' 0"  5\' 0"  5\' 0"   Weight 119 lbs 1 oz 119 lbs 115 lbs 3 oz  BMI 23.25 23.24 22.5  Systolic   155  Diastolic   70  Pulse   87   Providers/Specialists:  NOTE: Primary physician provider listed below. Patient may have been seen by APP or partner within same practice.   PROVIDER ROLE / SPECIALTY LAST Eligha Bridegroom, MD General Surgery (Surgeon) 05/09/2023  Reubin Milan, MD Primary Care Provider 04/05/2023  Rudean Hitt, MD Cardiology 02/16/2023   Allergies:  No Known Allergies Current Home Medications:   No  current facility-administered medications for this encounter.    atorvastatin (LIPITOR) 40 MG tablet   diltiazem (CARDIZEM CD) 240 MG 24 hr capsule   ELIQUIS 2.5 MG TABS tablet   ENSURE (ENSURE)   furosemide (LASIX) 20 MG tablet   lisinopril (ZESTRIL) 20 MG tablet   Multiple Vitamins-Calcium (ONE-A-DAY WOMENS PO)   sertraline (ZOLOFT) 50 MG tablet   History:   Past Medical History:  Diagnosis Date   (HFpEF) heart failure with preserved ejection fraction (HCC)    a.) TTE 12/31/2021: EF 55-60%, no RWMAs, G1DD, mod LAE, mod MAC, norm RVSF, RVSP 19.9, mild MR.   Acute cholecystitis 10/11/2022   Anemia    Anginal pain (HCC)    Aortic atherosclerosis (HCC)    Asthma    Atrial fibrillation with RVR (HCC)    a.) CHA2DS2-VASc = 8 (age x2, sex, HFpEF, HTN, DVT x2, vascular disease history) as of 05/30/2023; b.) cardiac rate/rhythm maintained on oral diltiazem; chronically anticoagulated using dose reduced apixaban   CAD (coronary artery disease)    CKD stage 3b, GFR 30-44 ml/min (HCC)    COPD (chronic obstructive pulmonary disease) (HCC)    Coronary artery disease    Depression    Fall    GERD (gastroesophageal reflux disease)    Hiatal hernia    History of bilateral cataract extraction 2021   History of DVT of lower extremity    Hyperlipidemia    Hypertension    Myocardial infarction [facility])    a.) approx 2003; reported to be "mild"; details unclear   On apixaban therapy    Osteopenia    Polyp of sigmoid colon    Protein-calorie malnutrition, moderate (HCC)    QT prolongation    Scoliosis  Past Surgical History:  Procedure Laterality Date   ABDOMINAL HYSTERECTOMY     pt denies 05/13/23   CATARACT EXTRACTION, BILATERAL  2021   COLONOSCOPY WITH PROPOFOL N/A 12/05/2017   Procedure: COLONOSCOPY WITH PROPOFOL;  Surgeon: Midge Minium, MD;  Location: [facility];  Service: Endoscopy;  Laterality: N/A;   ESOPHAGOGASTRODUODENOSCOPY (EGD) WITH PROPOFOL N/A 12/05/2017    Procedure: ESOPHAGOGASTRODUODENOSCOPY (EGD) WITH PROPOFOL;  Surgeon: Midge Minium, MD;  Location: [facility];  Service: Endoscopy;  Laterality: N/A;   IR CHOLANGIOGRAM EXISTING TUBE  12/31/2022   IR EXCHANGE BILIARY DRAIN  12/09/2022   IR EXCHANGE BILIARY DRAIN  01/21/2023   IR PERC CHOLECYSTOSTOMY  10/12/2022   IR RADIOLOGIST EVAL & MGMT  12/09/2022   POLYPECTOMY  12/05/2017   Procedure: POLYPECTOMY;  Surgeon: Midge Minium, MD;  Location: [facility];  Service: Endoscopy;;   TONSILLECTOMY     TUBAL LIGATION     Family History  Problem Relation Age of Onset   Heart disease Mother    Brain cancer Mother    Heart attack Father    Liver disease Daughter    Throat cancer Brother    Cancer Maternal Grandfather        Unsure type   Social History   Tobacco Use   Smoking status: Never    Passive exposure: Never   Smokeless tobacco: Never   Tobacco comments:    smoking cessation materials not required  Substance Use Topics   Alcohol use: No   Pertinent Clinical Results:  LABS:  No visits with results within 30 Day(s) from this visit.  Latest known visit with results is:  Admission on 03/11/2023, Discharged on 03/11/2023  Component Date Value Ref Range Status   WBC 03/11/2023 5.2  4.0 - 10.5 K/uL Final   RBC 03/11/2023 3.33 (L)  3.87 - 5.11 MIL/uL Final   Hemoglobin 03/11/2023 10.8 (L)  12.0 - 15.0 g/dL Final   HCT 09/81/1914 32.4 (L)  36.0 - 46.0 % Final   MCV 03/11/2023 97.3  80.0 - 100.0 fL Final   MCH 03/11/2023 32.4  26.0 - 34.0 pg Final   MCHC 03/11/2023 33.3  30.0 - 36.0 g/dL Final   RDW 78/29/5621 13.2  11.5 - 15.5 % Final   Platelets 03/11/2023 179  150 - 400 K/uL Final   nRBC 03/11/2023 0.0  0.0 - 0.2 % Final   Neutrophils Relative % 03/11/2023 72  % Final   Neutro Abs 03/11/2023 3.7  1.7 - 7.7 K/uL Final   Lymphocytes Relative 03/11/2023 17  % Final   Lymphs Abs 03/11/2023 0.9  0.7 - 4.0 K/uL Final   Monocytes Relative 03/11/2023 8  % Final    Monocytes Absolute 03/11/2023 0.4  0.1 - 1.0 K/uL Final   Eosinophils Relative 03/11/2023 3  % Final   Eosinophils Absolute 03/11/2023 0.1  0.0 - 0.5 K/uL Final   Basophils Relative 03/11/2023 0  % Final   Basophils Absolute 03/11/2023 0.0  0.0 - 0.1 K/uL Final   Immature Granulocytes 03/11/2023 0  % Final   Abs Immature Granulocytes 03/11/2023 0.02  0.00 - 0.07 K/uL Final   Performed at [facility], [address] [place], [place] [address]   Sodium 03/11/2023 136  135 - 145 mmol/L Final   Potassium 03/11/2023 4.1  3.5 - 5.1 mmol/L Final   Chloride 03/11/2023 99  98 - 111 mmol/L Final   CO2 03/11/2023 27  22 - 32 mmol/L Final   Glucose,  Bld 03/11/2023 123 (H)  70 - 99 mg/dL Final   Glucose reference range applies only to samples taken after fasting for at least 8 hours.   BUN 03/11/2023 26 (H)  8 - 23 mg/dL Final   Creatinine, Ser 03/11/2023 1.18 (H)  0.44 - 1.00 mg/dL Final   Calcium 82/95/6213 8.8 (L)  8.9 - 10.3 mg/dL Final   Total Protein 08/65/7846 6.6  6.5 - 8.1 g/dL Final   Albumin 96/29/5284 3.2 (L)  3.5 - 5.0 g/dL Final   AST 13/24/4010 18  15 - 41 U/L Final   ALT 03/11/2023 15  0 - 44 U/L Final   Alkaline Phosphatase 03/11/2023 71  38 - 126 U/L Final   Total Bilirubin 03/11/2023 0.6  0.3 - 1.2 mg/dL Final   GFR, Estimated 03/11/2023 45 (L)  >60 mL/min Final   Comment: (NOTE) Calculated using the CKD-EPI Creatinine Equation (2021)    Anion gap 03/11/2023 10  5 - 15 Final   Performed at [facility], [address] [place], [place] [address]   Lipase 03/11/2023 38  11 - 51 U/L Final   Performed at [facility], [address] [place], [place] [address]    ECG: Date: 05/30/2023  Time ECG obtained: 1124 AM Rate: 78 bpm Rhythm:  NSR; LBBB Axis (leads I and aVF): normal Intervals: PR 180 ms. QRS 130 ms. QTc 485 ms. ST segment and T wave changes: No evidence of acute T wave abnormalities or significant ST segment elevation or depression.  Evidence of an age undetermined septal infarct noted.  Comparison: Similar to previous tracing obtained on 10/11/2022   IMAGING / PROCEDURES: CT ABDOMEN PELVIS W CONTRAST performed on 03/11/2023 Percutaneous cholecystostomy drain is no longer seen. Focal soft tissue thickening of the abdominal wall at the site of prior cholecystostomy tube insertion may reflect a small hematoma. A 4 mm radiodensity is seen within the presumed tract from the dislodged catheter, likely gallstone. Contracted gallbladder demonstrates cholelithiasis and circumferential mural thickening with trace pericholecystic free fluid. Similar left hiatal hernia containing the majority of the stomach and a loop of nonobstructed transverse colon. Aortic atherosclerosis  Coronary artery calcifications. Assessment for potential risk factor modification, dietary therapy or pharmacologic therapy may be warranted, if clinically indicated.  CT ANGIO CHEST PE W AND/OR WO CONTRAST performed on 10/11/2022 No evidence of arterial dilatation or embolus. Cardiomegaly with left ventricular and septal wall hypertrophy and prominent pulmonary veins, but no overt edema. Trace pleural effusions, decreased since the last CTA. Coronary and heavy aortic atherosclerosis. Large hiatal hernia. Cholelithiasis and findings worrisome for acute cholecystitis. Further evaluation recommended. Osteopenia, scoliosis and degenerative change. Small amount of air in the right subclavian vein continuing up the IJ vein, most likely either injected with contrast injection or with insertion of the patient's IV. There is no air in the right heart and main pulmonary arteries. Aortic atherosclerosis  TRANSTHORACIC ECHOCARDIOGRAM performed on 12/31/2021 Left ventricular ejection fraction, by estimation, is 55 to 60%. The left ventricle has normal function. The left ventricle has no regional wall motion abnormalities. There is moderate left ventricular hypertrophy.  Left ventricular diastolic parameters are consistent with Grade I diastolic dysfunction (impaired relaxation).  Right ventricular systolic function is normal. The right ventricular size is normal. There is normal pulmonary artery systolic pressure. The estimated right ventricular systolic pressure is 19.9 mmHg.  Left atrial size was moderately dilated.  The mitral valve is normal in structure. Mild mitral valve regurgitation. No evidence of  mitral stenosis. Moderate mitral annular calcification.  The aortic valve is normal in structure. Aortic valve regurgitation is not visualized. Aortic valve sclerosis is present, with no evidence of aortic valve stenosis.  The inferior vena cava is normal in size with greater than 50% respiratory variability, suggesting right atrial pressure of 3 mmHg.   Impression and Plan:  Vanessa Farmer has been referred for pre-anesthesia review and clearance prior to her undergoing the planned anesthetic and procedural courses. Available labs, pertinent testing, and imaging results were personally reviewed by me in preparation for upcoming operative/procedural course. [facility] medical record has been updated following extensive record review and patient interview with PAT staff.   This patient has been appropriately cleared by cardiology with an overall MODERATE risk of experiencing significant perioperative cardiovascular complications. Based on clinical review performed today (05/30/23), barring any significant acute changes in the patient's overall condition, it is anticipated that she will be able to proceed with the planned surgical intervention. Any acute changes in clinical condition may necessitate her procedure being postponed and/or cancelled. Patient will meet with anesthesia team (MD and/or CRNA) on the day of her procedure for preoperative evaluation/assessment. Questions regarding anesthetic course will be fielded at that time.   Pre-surgical instructions were  reviewed with the patient during his PAT appointment, and questions were fielded to satisfaction by PAT clinical staff. She has been instructed on which medications that she will need to hold prior to surgery, as well as the ones that have been deemed safe/appropriate to take on the day of his procedure. As part of the general education provided by PAT, patient made aware both verbally and in writing, that she would need to abstain from the use of any illegal substances during his perioperative course. She was advised that failure to follow the provided instructions could necessitate case cancellation or result in serious perioperative complications up to and including death. Patient encouraged to contact PAT and/or her surgeon's office to discuss any questions or concerns that may arise prior to surgery; verbalized understanding.   Quentin Mulling, MSN, APRN, FNP-C, CEN [facility]  Perioperative Services Nurse Practitioner Phone: [phone] Fax: [phone] 05/30/23 3:01 PM  NOTE: This note has been prepared using Dragon dictation software. Despite my best ability to proofread, there is always the potential that unintentional transcriptional errors may still occur from this process.

## 2023-05-31 ENCOUNTER — Encounter: Payer: Self-pay | Admitting: Surgery

## 2023-05-31 ENCOUNTER — Other Ambulatory Visit: Payer: Self-pay

## 2023-05-31 ENCOUNTER — Ambulatory Visit: Payer: Medicare Other | Admitting: Urgent Care

## 2023-05-31 ENCOUNTER — Encounter
Admission: RE | Disposition: A | Discharge status: Home / Self Care | Payer: Self-pay | Source: Ambulatory Visit | Attending: Surgery

## 2023-05-31 ENCOUNTER — Ambulatory Visit
Admission: RE | Admit: 2023-05-31 | Discharge: 2023-05-31 | Disposition: A | Discharge status: Home / Self Care | Payer: Medicare Other | Source: Ambulatory Visit | Attending: Surgery | Admitting: Surgery

## 2023-05-31 ENCOUNTER — Ambulatory Visit: Payer: Self-pay | Admitting: Urgent Care

## 2023-05-31 DIAGNOSIS — K449 Diaphragmatic hernia without obstruction or gangrene: Secondary | ICD-10-CM | POA: Diagnosis not present

## 2023-05-31 DIAGNOSIS — K219 Gastro-esophageal reflux disease without esophagitis: Secondary | ICD-10-CM | POA: Insufficient documentation

## 2023-05-31 DIAGNOSIS — I700 Atherosclerosis of aorta: Secondary | ICD-10-CM | POA: Diagnosis not present

## 2023-05-31 DIAGNOSIS — I130 Hypertensive heart and chronic kidney disease with heart failure and stage 1 through stage 4 chronic kidney disease, or unspecified chronic kidney disease: Secondary | ICD-10-CM | POA: Diagnosis not present

## 2023-05-31 DIAGNOSIS — K810 Acute cholecystitis: Secondary | ICD-10-CM | POA: Diagnosis not present

## 2023-05-31 DIAGNOSIS — I4891 Unspecified atrial fibrillation: Secondary | ICD-10-CM | POA: Insufficient documentation

## 2023-05-31 DIAGNOSIS — I252 Old myocardial infarction: Secondary | ICD-10-CM | POA: Insufficient documentation

## 2023-05-31 DIAGNOSIS — K819 Cholecystitis, unspecified: Secondary | ICD-10-CM | POA: Diagnosis not present

## 2023-05-31 DIAGNOSIS — Z8249 Family history of ischemic heart disease and other diseases of the circulatory system: Secondary | ICD-10-CM | POA: Diagnosis not present

## 2023-05-31 DIAGNOSIS — I2510 Atherosclerotic heart disease of native coronary artery without angina pectoris: Secondary | ICD-10-CM | POA: Diagnosis not present

## 2023-05-31 DIAGNOSIS — N1832 Chronic kidney disease, stage 3b: Secondary | ICD-10-CM | POA: Insufficient documentation

## 2023-05-31 DIAGNOSIS — I5032 Chronic diastolic (congestive) heart failure: Secondary | ICD-10-CM | POA: Diagnosis not present

## 2023-05-31 DIAGNOSIS — K812 Acute cholecystitis with chronic cholecystitis: Secondary | ICD-10-CM | POA: Diagnosis not present

## 2023-05-31 DIAGNOSIS — E785 Hyperlipidemia, unspecified: Secondary | ICD-10-CM | POA: Diagnosis not present

## 2023-05-31 DIAGNOSIS — Z86718 Personal history of other venous thrombosis and embolism: Secondary | ICD-10-CM | POA: Insufficient documentation

## 2023-05-31 DIAGNOSIS — J4489 Other specified chronic obstructive pulmonary disease: Secondary | ICD-10-CM | POA: Diagnosis not present

## 2023-05-31 DIAGNOSIS — F32A Depression, unspecified: Secondary | ICD-10-CM | POA: Diagnosis not present

## 2023-05-31 HISTORY — DX: Other specified disorders of bone density and structure, unspecified site: M85.80

## 2023-05-31 HISTORY — DX: Diaphragmatic hernia without obstruction or gangrene: K44.9

## 2023-05-31 HISTORY — DX: Unspecified diastolic (congestive) heart failure: I50.30

## 2023-05-31 HISTORY — DX: Atherosclerotic heart disease of native coronary artery without angina pectoris: I25.10

## 2023-05-31 HISTORY — DX: Atherosclerosis of aorta: I70.0

## 2023-05-31 HISTORY — DX: Angina pectoris, unspecified: I20.9

## 2023-05-31 HISTORY — DX: Long term (current) use of anticoagulants: Z79.01

## 2023-05-31 SURGERY — CHOLECYSTECTOMY, ROBOT-ASSISTED, LAPAROSCOPIC
Anesthesia: General

## 2023-05-31 MED ORDER — PROPOFOL 10 MG/ML IV BOLUS
INTRAVENOUS | Status: DC | PRN
  Administered 2023-05-31: 50 mg via INTRAVENOUS

## 2023-05-31 MED ORDER — ONDANSETRON HCL 4 MG/2ML IJ SOLN
INTRAMUSCULAR | Status: DC | PRN
  Administered 2023-05-31: 4 mg via INTRAVENOUS

## 2023-05-31 MED ORDER — ESMOLOL HCL 100 MG/10ML IV SOLN
INTRAVENOUS | Status: AC
  Filled 2023-05-31: qty 10

## 2023-05-31 MED ORDER — IPRATROPIUM-ALBUTEROL 0.5-2.5 (3) MG/3ML IN SOLN
3.0000 mL | RESPIRATORY_TRACT | Status: DC
  Administered 2023-05-31: 3 mL via RESPIRATORY_TRACT

## 2023-05-31 MED ORDER — MIDAZOLAM HCL 2 MG/2ML IJ SOLN
INTRAMUSCULAR | Status: DC | PRN
  Administered 2023-05-31: 1 mg via INTRAVENOUS

## 2023-05-31 MED ORDER — OXYCODONE HCL 5 MG PO TABS
5.0000 mg | ORAL_TABLET | Freq: Once | ORAL | Status: AC | PRN
  Administered 2023-05-31: 5 mg via ORAL

## 2023-05-31 MED ORDER — SILVER NITRATE-POT NITRATE 75-25 % EX MISC
CUTANEOUS | Status: AC
  Filled 2023-05-31: qty 10

## 2023-05-31 MED ORDER — OXYCODONE HCL 5 MG/5ML PO SOLN
5.0000 mg | Freq: Once | ORAL | Status: AC | PRN
Start: 2023-05-31 — End: 2023-05-31

## 2023-05-31 MED ORDER — DEXAMETHASONE SODIUM PHOSPHATE 10 MG/ML IJ SOLN
INTRAMUSCULAR | Status: DC | PRN
  Administered 2023-05-31: 8 mg via INTRAVENOUS

## 2023-05-31 MED ORDER — INDOCYANINE GREEN 25 MG IV SOLR
1.2500 mg | INTRAVENOUS | Status: AC
  Administered 2023-05-31: 1.25 mg via INTRAVENOUS
  Filled 2023-05-31: qty 0.5

## 2023-05-31 MED ORDER — ORAL CARE MOUTH RINSE: 15.0000 mL | Freq: Once | OROMUCOSAL | Status: AC

## 2023-05-31 MED ORDER — DEXAMETHASONE SODIUM PHOSPHATE 10 MG/ML IJ SOLN
INTRAMUSCULAR | Status: AC
  Filled 2023-05-31: qty 1

## 2023-05-31 MED ORDER — PHENYLEPHRINE 80 MCG/ML (10ML) SYRINGE FOR IV PUSH (FOR BLOOD PRESSURE SUPPORT)
PREFILLED_SYRINGE | INTRAVENOUS | Status: DC | PRN
  Administered 2023-05-31: 100 ug via INTRAVENOUS

## 2023-05-31 MED ORDER — CEFAZOLIN SODIUM-DEXTROSE 2-4 GM/100ML-% IV SOLN
2.0000 g | INTRAVENOUS | Status: AC
  Administered 2023-05-31: 2 g via INTRAVENOUS

## 2023-05-31 MED ORDER — LACTATED RINGERS IV SOLN: INTRAVENOUS | Status: DC

## 2023-05-31 MED ORDER — BUPIVACAINE-EPINEPHRINE (PF) 0.25% -1:200000 IJ SOLN
INTRAMUSCULAR | Status: DC | PRN
  Administered 2023-05-31: 30 mL via PERINEURAL

## 2023-05-31 MED ORDER — PHENYLEPHRINE 80 MCG/ML (10ML) SYRINGE FOR IV PUSH (FOR BLOOD PRESSURE SUPPORT)
PREFILLED_SYRINGE | INTRAVENOUS | Status: AC
  Filled 2023-05-31: qty 10

## 2023-05-31 MED ORDER — LIDOCAINE HCL (CARDIAC) PF 100 MG/5ML IV SOSY
PREFILLED_SYRINGE | INTRAVENOUS | Status: DC | PRN
  Administered 2023-05-31: 100 mg via INTRAVENOUS

## 2023-05-31 MED ORDER — BUPIVACAINE-EPINEPHRINE (PF) 0.25% -1:200000 IJ SOLN
INTRAMUSCULAR | Status: AC
  Filled 2023-05-31: qty 30

## 2023-05-31 MED ORDER — ACETAMINOPHEN 500 MG PO TABS: 1000.0000 mg | ORAL_TABLET | Freq: Four times a day (QID) | ORAL | Status: AC | PRN

## 2023-05-31 MED ORDER — FENTANYL CITRATE (PF) 100 MCG/2ML IJ SOLN
25.0000 ug | INTRAMUSCULAR | Status: DC | PRN
Start: 2023-05-31 — End: 2023-05-31

## 2023-05-31 MED ORDER — PROPOFOL 10 MG/ML IV BOLUS
INTRAVENOUS | Status: AC
  Filled 2023-05-31: qty 20

## 2023-05-31 MED ORDER — IPRATROPIUM-ALBUTEROL 0.5-2.5 (3) MG/3ML IN SOLN
RESPIRATORY_TRACT | Status: AC
  Filled 2023-05-31: qty 3

## 2023-05-31 MED ORDER — GABAPENTIN 100 MG PO CAPS
ORAL_CAPSULE | ORAL | Status: AC
  Filled 2023-05-31: qty 2

## 2023-05-31 MED ORDER — OXYCODONE HCL 5 MG PO TABS: 5.0000 mg | ORAL_TABLET | Freq: Four times a day (QID) | ORAL | 0 refills | Status: DC | PRN

## 2023-05-31 MED ORDER — 0.9 % SODIUM CHLORIDE (POUR BTL) OPTIME
TOPICAL | Status: DC | PRN
  Administered 2023-05-31: 500 mL

## 2023-05-31 MED ORDER — ACETAMINOPHEN 500 MG PO TABS
1000.0000 mg | ORAL_TABLET | ORAL | Status: AC
Start: 2023-05-31 — End: 2023-05-31
  Administered 2023-05-31: 1000 mg via ORAL

## 2023-05-31 MED ORDER — SUGAMMADEX SODIUM 200 MG/2ML IV SOLN
INTRAVENOUS | Status: DC | PRN
  Administered 2023-05-31: 200 mg via INTRAVENOUS

## 2023-05-31 MED ORDER — ROCURONIUM BROMIDE 10 MG/ML (PF) SYRINGE
PREFILLED_SYRINGE | INTRAVENOUS | Status: AC
  Filled 2023-05-31: qty 10

## 2023-05-31 MED ORDER — ESMOLOL HCL 100 MG/10ML IV SOLN
INTRAVENOUS | Status: DC | PRN
  Administered 2023-05-31 (×2): 10 mg via INTRAVENOUS

## 2023-05-31 MED ORDER — CHLORHEXIDINE GLUCONATE 0.12 % MT SOLN
15.0000 mL | Freq: Once | OROMUCOSAL | Status: AC
  Administered 2023-05-31: 15 mL via OROMUCOSAL

## 2023-05-31 MED ORDER — GABAPENTIN 100 MG PO CAPS
200.0000 mg | ORAL_CAPSULE | ORAL | Status: AC
  Administered 2023-05-31: 200 mg via ORAL

## 2023-05-31 MED ORDER — MIDAZOLAM HCL 2 MG/2ML IJ SOLN
INTRAMUSCULAR | Status: AC
  Filled 2023-05-31: qty 2

## 2023-05-31 MED ORDER — CHLORHEXIDINE GLUCONATE CLOTH 2 % EX PADS
6.0000 | MEDICATED_PAD | Freq: Once | CUTANEOUS | Status: DC
Start: 2023-05-31 — End: 2023-05-31

## 2023-05-31 MED ORDER — CHLORHEXIDINE GLUCONATE CLOTH 2 % EX PADS: 6.0000 | MEDICATED_PAD | Freq: Once | CUTANEOUS | Status: DC

## 2023-05-31 MED ORDER — CEFAZOLIN SODIUM-DEXTROSE 2-4 GM/100ML-% IV SOLN
INTRAVENOUS | Status: AC
  Filled 2023-05-31: qty 100

## 2023-05-31 MED ORDER — SILVER SULFADIAZINE 1 % EX CREA
TOPICAL_CREAM | CUTANEOUS | Status: AC
  Filled 2023-05-31: qty 20

## 2023-05-31 MED ORDER — FENTANYL CITRATE (PF) 100 MCG/2ML IJ SOLN
INTRAMUSCULAR | Status: AC
  Filled 2023-05-31: qty 2

## 2023-05-31 MED ORDER — BUPIVACAINE LIPOSOME 1.3 % IJ SUSP
INTRAMUSCULAR | Status: DC | PRN
  Administered 2023-05-31: 10 mL

## 2023-05-31 MED ORDER — LIDOCAINE HCL (PF) 2 % IJ SOLN
INTRAMUSCULAR | Status: AC
  Filled 2023-05-31: qty 5

## 2023-05-31 MED ORDER — BUPIVACAINE LIPOSOME 1.3 % IJ SUSP: 20.0000 mL | Freq: Once | INTRAMUSCULAR | Status: DC

## 2023-05-31 MED ORDER — FENTANYL CITRATE (PF) 100 MCG/2ML IJ SOLN
INTRAMUSCULAR | Status: DC | PRN
  Administered 2023-05-31: 25 ug via INTRAVENOUS
  Administered 2023-05-31 (×2): 12.5 ug via INTRAVENOUS
  Administered 2023-05-31 (×2): 25 ug via INTRAVENOUS
  Administered 2023-05-31: 50 ug via INTRAVENOUS
  Administered 2023-05-31: 25 ug via INTRAVENOUS

## 2023-05-31 MED ORDER — EPHEDRINE 5 MG/ML INJ
INTRAVENOUS | Status: AC
  Filled 2023-05-31: qty 5

## 2023-05-31 MED ORDER — ACETAMINOPHEN 10 MG/ML IV SOLN: 1000.0000 mg | Freq: Once | INTRAVENOUS | Status: DC | PRN

## 2023-05-31 MED ORDER — ONDANSETRON HCL 4 MG/2ML IJ SOLN
INTRAMUSCULAR | Status: AC
  Filled 2023-05-31: qty 2

## 2023-05-31 MED ORDER — CHLORHEXIDINE GLUCONATE 0.12 % MT SOLN
OROMUCOSAL | Status: AC
  Filled 2023-05-31: qty 15

## 2023-05-31 MED ORDER — BUPIVACAINE LIPOSOME 1.3 % IJ SUSP
INTRAMUSCULAR | Status: AC
  Filled 2023-05-31: qty 10

## 2023-05-31 MED ORDER — ACETAMINOPHEN 500 MG PO TABS
ORAL_TABLET | ORAL | Status: AC
  Filled 2023-05-31: qty 2

## 2023-05-31 MED ORDER — OXYCODONE HCL 5 MG PO TABS
ORAL_TABLET | ORAL | Status: AC
  Filled 2023-05-31: qty 1

## 2023-05-31 MED ORDER — ROCURONIUM BROMIDE 100 MG/10ML IV SOLN
INTRAVENOUS | Status: DC | PRN
  Administered 2023-05-31: 20 mg via INTRAVENOUS
  Administered 2023-05-31: 40 mg via INTRAVENOUS

## 2023-05-31 SURGICAL SUPPLY — 43 items
BAG PRESSURE INF REUSE 1000 (BAG) IMPLANT
CANNULA CAP OBTURATR AIRSEAL 8 (CAP) IMPLANT
CAUTERY HOOK MNPLR 1.6 DVNC XI (INSTRUMENTS) ×1 IMPLANT
CLIP LIGATING HEMO O LOK GREEN (MISCELLANEOUS) ×1 IMPLANT
DERMABOND ADVANCED .7 DNX12 (GAUZE/BANDAGES/DRESSINGS) ×1 IMPLANT
DRAPE ARM DVNC X/XI (DISPOSABLE) ×4 IMPLANT
DRAPE COLUMN DVNC XI (DISPOSABLE) ×1 IMPLANT
ELECT CAUTERY BLADE TIP 2.5 (TIP) ×1
ELECT REM PT RETURN 9FT ADLT (ELECTROSURGICAL) ×1
ELECTRODE CAUTERY BLDE TIP 2.5 (TIP) ×1 IMPLANT
ELECTRODE REM PT RTRN 9FT ADLT (ELECTROSURGICAL) ×1 IMPLANT
FORCEPS BPLR R/ABLATION 8 DVNC (INSTRUMENTS) ×1 IMPLANT
FORCEPS PROGRASP DVNC XI (FORCEP) ×1 IMPLANT
GAUZE SPONGE 2X2 STRL 8-PLY (GAUZE/BANDAGES/DRESSINGS) IMPLANT
GLOVE SURG SYN 7.0 (GLOVE) ×2
GLOVE SURG SYN 7.0 PF PI (GLOVE) ×2 IMPLANT
GLOVE SURG SYN 7.5 E (GLOVE) ×2
GLOVE SURG SYN 7.5 PF PI (GLOVE) ×2 IMPLANT
GOWN STRL REUS W/ TWL LRG LVL3 (GOWN DISPOSABLE) ×4 IMPLANT
IRRIGATOR SUCT 8 DISP DVNC XI (IRRIGATION / IRRIGATOR) IMPLANT
IV NS 1000ML BAXH (IV SOLUTION) IMPLANT
KIT PINK PAD W/HEAD ARE REST (MISCELLANEOUS) ×1
KIT PINK PAD W/HEAD ARM REST (MISCELLANEOUS) ×1 IMPLANT
LABEL OR SOLS (LABEL) ×1 IMPLANT
MANIFOLD NEPTUNE II (INSTRUMENTS) ×1 IMPLANT
NDL HYPO 22X1.5 SAFETY MO (MISCELLANEOUS) ×1 IMPLANT
NEEDLE HYPO 22X1.5 SAFETY MO (MISCELLANEOUS) ×1
NS IRRIG 500ML POUR BTL (IV SOLUTION) ×1 IMPLANT
OBTURATOR OPTICAL STND 8 DVNC (TROCAR) ×1
OBTURATOR OPTICALSTD 8 DVNC (TROCAR) ×1 IMPLANT
PACK LAP CHOLECYSTECTOMY (MISCELLANEOUS) ×1 IMPLANT
SEAL UNIV 5-12 XI (MISCELLANEOUS) ×4 IMPLANT
SET TUBE FILTERED XL AIRSEAL (SET/KITS/TRAYS/PACK) IMPLANT
SET TUBE SMOKE EVAC HIGH FLOW (TUBING) ×1 IMPLANT
SOL ELECTROSURG ANTI STICK (MISCELLANEOUS) ×1
SOLUTION ELECTROSURG ANTI STCK (MISCELLANEOUS) ×1 IMPLANT
SPIKE FLUID TRANSFER (MISCELLANEOUS) ×1 IMPLANT
SUT MNCRL AB 4-0 PS2 18 (SUTURE) ×1 IMPLANT
SUT VIC AB 3-0 SH 27X BRD (SUTURE) IMPLANT
SUT VICRYL 0 UR6 27IN ABS (SUTURE) ×2 IMPLANT
SYS BAG RETRIEVAL 10MM (BASKET) ×1
SYSTEM BAG RETRIEVAL 10MM (BASKET) ×1 IMPLANT
WATER STERILE IRR 500ML POUR (IV SOLUTION) ×1 IMPLANT

## 2023-05-31 NOTE — Discharge Instructions (Signed)
Discharge Instructions: 1.  Patient may shower, but do not scrub wounds heavily and dab dry only. 2.  Do not submerge wounds in pool/tub until fully healed. 3.  Do not apply ointments or hydrogen peroxide to the wounds. 4.  May apply ice packs to the wounds for comfort. 5.  Please change right upper quadrant dressing once daily.  May apply BandAid or Mepilex foam dressing. 6.  May resume your Eliquis on 06/02/23. 7.  Do not drive while taking narcotics for pain control.  Prior to driving, make sure you are able to rotate right and left to look at blindspots without significant pain or discomfort. 8.  No heavy lifting or pushing of more than 10-15 lbs for 4 weeks.

## 2023-05-31 NOTE — Anesthesia Preprocedure Evaluation (Addendum)
Anesthesia Evaluation  Patient identified by MRN, date of birth, ID band Patient awake    Reviewed: Allergy & Precautions, H&P , NPO status , Patient's Chart, lab work & pertinent test results, reviewed documented beta blocker date and time   Airway Mallampati: II  TM Distance: >3 FB Neck ROM: full    Dental  (+) Teeth Intact   Pulmonary asthma , COPD   Pulmonary exam normal        Cardiovascular Exercise Tolerance: Poor hypertension, On Medications + angina with exertion + CAD, + Past MI and +CHF  (-) Orthopnea, (-) PND and (-) DOE (-) dysrhythmias Atrial Fibrillation (-) Valvular Problems/Murmurs Rhythm:regular Rate:Normal  See clearance.   Neuro/Psych  PSYCHIATRIC DISORDERS  Depression     Neuromuscular disease    GI/Hepatic Neg liver ROS, hiatal hernia,GERD  ,,  Endo/Other  negative endocrine ROS    Renal/GU Renal disease  negative genitourinary   Musculoskeletal   Abdominal   Peds  Hematology  (+) Blood dyscrasia, anemia   Anesthesia Other Findings Past Medical History: No date: (HFpEF) heart failure with preserved ejection fraction (HCC)     Comment:  a.) TTE 12/31/2021: EF 55-60%, no RWMAs, G1DD, mod LAE,               mod MAC, norm RVSF, RVSP 19.9, mild MR. 10/11/2022: Acute cholecystitis No date: Anemia No date: Anginal pain (HCC) No date: Aortic atherosclerosis (HCC) No date: Asthma No date: Atrial fibrillation with RVR (HCC)     Comment:  a.) CHA2DS2-VASc = 8 (age x2, sex, HFpEF, HTN, DVT x2,               vascular disease history) as of 05/30/2023; b.) cardiac               rate/rhythm maintained on oral diltiazem; chronically               anticoagulated using dose reduced apixaban No date: CAD (coronary artery disease) No date: CKD stage 3b, GFR 30-44 ml/min (HCC) No date: COPD (chronic obstructive pulmonary disease) (HCC) No date: Coronary artery disease No date: Depression No date: Fall No  date: GERD (gastroesophageal reflux disease) No date: Hiatal hernia 2021: History of bilateral cataract extraction No date: History of DVT of lower extremity No date: Hyperlipidemia No date: Hypertension No date: Myocardial infarction [facility])     Comment:  a.) approx 2003; reported to be "mild"; details unclear No date: On apixaban therapy No date: Osteopenia No date: Polyp of sigmoid colon No date: Protein-calorie malnutrition, moderate (HCC) No date: QT prolongation No date: Scoliosis Past Surgical History: No date: ABDOMINAL HYSTERECTOMY     Comment:  pt denies 05/13/23 2021: CATARACT EXTRACTION, BILATERAL 12/05/2017: COLONOSCOPY WITH PROPOFOL; N/A     Comment:  Procedure: COLONOSCOPY WITH PROPOFOL;  Surgeon: Midge Minium, MD;  Location: [facility];  Service:               Endoscopy;  Laterality: N/A; 12/05/2017: ESOPHAGOGASTRODUODENOSCOPY (EGD) WITH PROPOFOL; N/A     Comment:  Procedure: ESOPHAGOGASTRODUODENOSCOPY (EGD) WITH               PROPOFOL;  Surgeon: Midge Minium, MD;  Location: [facility]               SURGERY CNTR;  Service: Endoscopy;  Laterality: N/A; 12/31/2022: IR CHOLANGIOGRAM EXISTING TUBE 12/09/2022: IR EXCHANGE BILIARY DRAIN 01/21/2023: IR EXCHANGE BILIARY  DRAIN 10/12/2022: IR PERC CHOLECYSTOSTOMY 12/09/2022: IR RADIOLOGIST EVAL & MGMT 12/05/2017: POLYPECTOMY     Comment:  Procedure: POLYPECTOMY;  Surgeon: Midge Minium, MD;                Location: [facility];  Service: Endoscopy;; No date: TONSILLECTOMY No date: TUBAL LIGATION BMI    Body Mass Index: 23.24 kg/m     Reproductive/Obstetrics negative OB ROS                             Anesthesia Physical Anesthesia Plan  ASA: 3  Anesthesia Plan: General ETT   Post-op Pain Management:    Induction:   PONV Risk Score and Plan: 4 or greater  Airway Management Planned:   Additional Equipment:   Intra-op Plan:   Post-operative Plan:    Informed Consent: I have reviewed the patients History and Physical, chart, labs and discussed the procedure including the risks, benefits and alternatives for the proposed anesthesia with the patient or authorized representative who has indicated his/her understanding and acceptance.     Dental Advisory Given  Plan Discussed with: CRNA  Anesthesia Plan Comments:        Anesthesia Quick Evaluation

## 2023-05-31 NOTE — Anesthesia Procedure Notes (Signed)
Procedure Name: Intubation Date/Time: 05/31/2023 7:38 AM  Performed by: Rich Brave, CRNAPre-anesthesia Checklist: Patient identified, Emergency Drugs available, Patient being monitored, Suction available and Timeout performed Patient Re-evaluated:Patient Re-evaluated prior to induction Oxygen Delivery Method: Circle system utilized Preoxygenation: Pre-oxygenation with 100% oxygen Induction Type: IV induction Ventilation: Mask ventilation without difficulty Grade View: Grade I Tube type: Oral Tube size: 6.0 mm Number of attempts: 1 Airway Equipment and Method: Stylet and Video-laryngoscopy Placement Confirmation: positive ETCO2, breath sounds checked- equal and bilateral and ETT inserted through vocal cords under direct vision Secured at: 17 cm Tube secured with: Tape Dental Injury: Teeth and Oropharynx as per pre-operative assessment

## 2023-05-31 NOTE — Interval H&P Note (Signed)
History and Physical Interval Note:  05/31/2023 7:06 AM  Vanessa Farmer  has presented today for surgery, with the diagnosis of cholecystitis.  The various methods of treatment have been discussed with the patient and family. After consideration of risks, benefits and other options for treatment, the patient has consented to  Procedure(s): XI ROBOTIC ASSISTED LAPAROSCOPIC CHOLECYSTECTOMY (N/A) INDOCYANINE GREEN FLUORESCENCE IMAGING (ICG) (N/A) as a surgical intervention.  The patient's history has been reviewed, patient examined, no change in status, stable for surgery.  I have reviewed the patient's chart and labs.  Questions were answered to the patient's satisfaction.     Vanessa Farmer

## 2023-05-31 NOTE — Transfer of Care (Signed)
Immediate Anesthesia Transfer of Care Note  Patient: Vanessa Farmer  Procedure(s) Performed: XI ROBOTIC ASSISTED LAPAROSCOPIC CHOLECYSTECTOMY INDOCYANINE GREEN FLUORESCENCE IMAGING (ICG)  Patient Location: PACU  Anesthesia Type:General  Level of Consciousness: drowsy and patient cooperative  Airway & Oxygen Therapy: Patient connected to face mask oxygen  Post-op Assessment: Report given to RN and Post -op Vital signs reviewed and stable  Post vital signs: Reviewed and stable  Last Vitals:  Vitals Value Taken Time  BP 176/66 05/31/23 0954  Temp 97.1   Pulse 94 05/31/23 0958  Resp 23 05/31/23 0958  SpO2 100 % 05/31/23 0958  Vitals shown include unfiled device data.  Last Pain:  Vitals:   05/31/23 0651  TempSrc: Oral  PainSc: 0-No pain         Complications: No notable events documented.

## 2023-05-31 NOTE — Op Note (Signed)
  Procedure Date:  05/31/2023  Pre-operative Diagnosis:  Acute cholecystitis  Post-operative Diagnosis: Acute cholecystitis  Procedure:  Robotic assisted cholecystectomy with ICG FireFly cholangiogram  Surgeon:  Howie Ill, MD  Anesthesia:  General endotracheal  Estimated Blood Loss:  10 ml  Specimens:  gallbladder  Complications:  None  Indications for Procedure:  This is a 88 y.o. female with a history of acute cholecystitis, s/p percutaneous cholecystostomy drain placement.  The drain was dislodged 2 months ago, and the patient presents for cholecystectomy.  She has been cleared by cardiology and stopped her Eliquis as instructed.  The benefits, complications, treatment options, and expected outcomes were discussed with the patient. The risks of bleeding, infection, recurrence of symptoms, failure to resolve symptoms, bile duct damage, bile duct leak, retained common bile duct stone, bowel injury, and need for further procedures were all discussed with the patient and she was willing to proceed.  Description of Procedure: The patient was correctly identified in the preoperative area and brought into the operating room.  The patient was placed supine with VTE prophylaxis in place.  Appropriate time-outs were performed.  Anesthesia was induced and the patient was intubated.  Appropriate antibiotics were infused.  The abdomen was prepped and draped in a sterile fashion. An infraumbilical incision was made. A cutdown technique was used to enter the abdominal cavity without injury, and a 12 mm robotic port was inserted.  Pneumoperitoneum was obtained with appropriate opening pressures.  Three 8-mm ports were placed in the mid abdomen at the level of the umbilicus under direct visualization.  The DaVinci platform was docked, camera targeted, and instruments were placed under direct visualization.  The gallbladder was identified.  It was very contracted and scarred onto the anterior  abdominal wall.   Adhesions were lysed bluntly and with electrocautery. The infundibulum was grasped and retracted laterally, exposing the peritoneum overlying the gallbladder.  This was incised with electrocautery and extended on either side of the gallbladder.  FireFly cholangiogram was then obtained, and we were able to clearly identify the cystic duct and common bile duct.  The cystic duct and cystic artery were carefully dissected with combination of cautery and blunt dissection.  Both were clipped twice proximally and once distally, cutting in between.  The gallbladder was taken from the gallbladder fossa in a retrograde fashion with electrocautery.  The gallbladder fundus was separated from the abdominal wall using cautery.  The gallbladder was placed in an Endocatch bag. The liver bed was inspected and any bleeding was controlled with electrocautery. The right upper quadrant was then inspected again revealing intact clips, no bleeding, and no ductal injury.  The 8 mm ports were removed under direct visualization and the 12 mm port was removed.  The Endocatch bag was brought out via the umbilical incision. The fascial opening was closed using 0 vicryl suture.  Local anesthetic was infused in all incisions and the incisions were closed with 4-0 Monocryl.  The wounds were cleaned and sealed with DermaBond.  The patient was emerged from anesthesia and extubated and brought to the recovery room for further management.  The patient tolerated the procedure well and all counts were correct at the end of the case.   Howie Ill, MD

## 2023-06-01 LAB — SURGICAL PATHOLOGY

## 2023-06-01 NOTE — Anesthesia Postprocedure Evaluation (Signed)
Anesthesia Post Note  Patient: VENIS BANKE  Procedure(s) Performed: XI ROBOTIC ASSISTED LAPAROSCOPIC CHOLECYSTECTOMY INDOCYANINE GREEN FLUORESCENCE IMAGING (ICG)  Patient location during evaluation: PACU Anesthesia Type: General Level of consciousness: awake and alert Pain management: pain level controlled Vital Signs Assessment: post-procedure vital signs reviewed and stable Respiratory status: spontaneous breathing, nonlabored ventilation, respiratory function stable and patient connected to nasal cannula oxygen Cardiovascular status: blood pressure returned to baseline and stable Postop Assessment: no apparent nausea or vomiting Anesthetic complications: no   No notable events documented.   Last Vitals:  Vitals:   05/31/23 1112 05/31/23 1133  BP:  (!) 144/80  Pulse: 87   Resp: (!) 23 20  Temp: 36.9 C   SpO2: 97% 94%    Last Pain:  Vitals:   05/31/23 1030  TempSrc:   PainSc: 0-No pain                 Yevette Edwards

## 2023-06-15 ENCOUNTER — Encounter: Payer: Medicare Other | Admitting: Physician Assistant

## 2023-06-22 ENCOUNTER — Ambulatory Visit (INDEPENDENT_AMBULATORY_CARE_PROVIDER_SITE_OTHER): Payer: Medicare Other | Admitting: Physician Assistant

## 2023-06-22 ENCOUNTER — Encounter: Payer: Self-pay | Admitting: Physician Assistant

## 2023-06-22 VITALS — BP 165/65 | HR 101 | Temp 97.8°F | Ht 60.0 in | Wt 113.4 lb

## 2023-06-22 DIAGNOSIS — K810 Acute cholecystitis: Secondary | ICD-10-CM

## 2023-06-22 DIAGNOSIS — Z09 Encounter for follow-up examination after completed treatment for conditions other than malignant neoplasm: Secondary | ICD-10-CM

## 2023-06-22 NOTE — Progress Notes (Signed)
[name] POST-OP OFFICE VISIT  06/22/2023  HPI: Vanessa Farmer is a 88 y.o. female 22 days s/p robotic assisted laparoscopic cholecystectomy for acute cholecystitis with Dr Aleen Campi   She has done well given the circumstances No abdominal pain, fever, chills, nausea, emesis Daughter reports her appetite has been good; eating well She does have occasional serous drainage from cholecystostomy tube site; using superficial dressings as needed Ambulating with walker; not walking as much  Vital signs: BP (!) 165/65   Pulse (!) 101   Temp 97.8 F (36.6 C) (Oral)   Ht 5' (1.524 m)   Wt 113 lb 6.4 oz (51.4 kg)   SpO2 96%   BMI 22.15 kg/m    Physical Exam: Constitutional: Well appearing female, NAD Abdomen: Soft, non-tender, non-distended, no rebound/guarding Skin: Laparoscopic incisions are healing well, no erythema or drainage. She does have scant serous drainage from cholecystostomy tube site in RUQ; no erythema. Superficial dressing placed   Assessment/Plan: This is a 88 y.o. female 22 days s/p robotic assisted laparoscopic cholecystectomy for acute cholecystitis with Dr Aleen Campi    - Pain control prn  - Reviewed wound care recommendation; okay to cover RUQ drain site with superficial dressing as needed  - Reviewed lifting restrictions; 4 weeks total  - Reviewed surgical pathology; Acute on chronic cholecystitis   - She can follow up on as needed basis; She understands to call with questions/concerns  -- Lynden Oxford, PA-C [facility] 06/22/2023, 2:42 PM M-F: 7am - 4pm

## 2023-06-22 NOTE — Patient Instructions (Signed)

## 2023-06-23 ENCOUNTER — Other Ambulatory Visit: Payer: Self-pay | Admitting: Internal Medicine

## 2023-06-23 DIAGNOSIS — K219 Gastro-esophageal reflux disease without esophagitis: Secondary | ICD-10-CM

## 2023-06-23 DIAGNOSIS — I10 Essential (primary) hypertension: Secondary | ICD-10-CM

## 2023-06-23 DIAGNOSIS — F321 Major depressive disorder, single episode, moderate: Secondary | ICD-10-CM

## 2023-06-23 DIAGNOSIS — I482 Chronic atrial fibrillation, unspecified: Secondary | ICD-10-CM

## 2023-06-23 DIAGNOSIS — E785 Hyperlipidemia, unspecified: Secondary | ICD-10-CM

## 2023-06-23 NOTE — Telephone Encounter (Signed)
Requested Prescriptions  Pending Prescriptions Disp Refills   diltiazem (CARDIZEM CD) 240 MG 24 hr capsule [Pharmacy Med Name: DILTIAZEM CD 240MG  CAPSULES (24 HR)] 90 capsule 0    Sig: TAKE 1 CAPSULE(240 MG) BY MOUTH DAILY     Cardiovascular: Calcium Channel Blockers 3 Failed - 06/23/2023  4:04 PM      Failed - Cr in normal range and within 360 days    Creatinine, Ser  Date Value Ref Range Status  05/30/2023 1.31 (H) 0.44 - 1.00 mg/dL Final         Failed - Last BP in normal range    BP Readings from Last 1 Encounters:  06/22/23 (!) 165/65         Passed - ALT in normal range and within 360 days    ALT  Date Value Ref Range Status  03/11/2023 15 0 - 44 U/L Final         Passed - AST in normal range and within 360 days    AST  Date Value Ref Range Status  03/11/2023 18 15 - 41 U/L Final         Passed - Last Heart Rate in normal range    Pulse Readings from Last 1 Encounters:  06/22/23 (!) 101         Passed - Valid encounter within last 6 months    Recent Outpatient Visits           2 months ago Essential hypertension   [place] [facility], Nyoka Cowden, MD   6 months ago Essential hypertension   [place] [facility] at [facility], Nyoka Cowden, MD   1 year ago Essential hypertension   [place] [facility] at [facility], Nyoka Cowden, MD   1 year ago Acute deep vein thrombosis (DVT) of both peroneal veins [facility])   [place] [facility] at [facility], Nyoka Cowden, MD   2 years ago Essential hypertension   [place] [facility] at [facility], Nyoka Cowden, MD       Future Appointments             In 1 week Evelene Croon, Atilano Median, MD  [facility]             ELIQUIS 2.5 MG TABS tablet [Pharmacy Med Name: ELIQUIS 2.5MG  TABLETS] 180 tablet 0    Sig: TAKE 1 TABLET(2.5  MG) BY MOUTH TWICE DAILY     Hematology:  Anticoagulants - apixaban Failed - 06/23/2023  4:04 PM      Failed - HGB in normal range and within 360 days    Hemoglobin  Date Value Ref Range Status  03/11/2023 10.8 (L) 12.0 - 15.0 g/dL Final  62/13/0865 78.4 11.1 - 15.9 g/dL Final         Failed - HCT in normal range and within 360 days    HCT  Date Value Ref Range Status  03/11/2023 32.4 (L) 36.0 - 46.0 % Final   Hematocrit  Date Value Ref Range Status  01/19/2022 37.5 34.0 - 46.6 % Final         Failed - Cr in normal range and within 360 days    Creatinine, Ser  Date Value Ref Range Status  05/30/2023 1.31 (H) 0.44 - 1.00 mg/dL Final  Passed - PLT in normal range and within 360 days    Platelets  Date Value Ref Range Status  03/11/2023 179 150 - 400 K/uL Final  01/19/2022 169 150 - 450 x10E3/uL Final         Passed - AST in normal range and within 360 days    AST  Date Value Ref Range Status  03/11/2023 18 15 - 41 U/L Final         Passed - ALT in normal range and within 360 days    ALT  Date Value Ref Range Status  03/11/2023 15 0 - 44 U/L Final         Passed - Valid encounter within last 12 months    Recent Outpatient Visits           2 months ago Essential hypertension   [place] [facility] at [facility], Nyoka Cowden, MD   6 months ago Essential hypertension   [place] [facility] at [facility], Nyoka Cowden, MD   1 year ago Essential hypertension   [place] [facility] at [facility], Nyoka Cowden, MD   1 year ago Acute deep vein thrombosis (DVT) of both peroneal veins [facility])   [place] [facility] at [facility], Nyoka Cowden, MD   2 years ago Essential hypertension   [place] [facility] at [facility], Nyoka Cowden, MD       Future Appointments             In 1 week Evelene Croon, Atilano Median,  MD [place] [facility]             furosemide (LASIX) 20 MG tablet [Pharmacy Med Name: FUROSEMIDE 20MG  TABLETS] 90 tablet 0    Sig: TAKE 1 TABLET(20 MG) BY MOUTH DAILY     Cardiovascular:  Diuretics - Loop Failed - 06/23/2023  4:04 PM      Failed - Cr in normal range and within 180 days    Creatinine, Ser  Date Value Ref Range Status  05/30/2023 1.31 (H) 0.44 - 1.00 mg/dL Final         Failed - Mg Level in normal range and within 180 days    Magnesium  Date Value Ref Range Status  10/13/2022 1.9 1.7 - 2.4 mg/dL Final    Comment:    Performed at [facility], [address] [place], [place] [address]         Failed - Last BP in normal range    BP Readings from Last 1 Encounters:  06/22/23 (!) 165/65         Passed - K in normal range and within 180 days    Potassium  Date Value Ref Range Status  05/30/2023 4.2 3.5 - 5.1 mmol/L Final         Passed - Ca in normal range and within 180 days    Calcium  Date Value Ref Range Status  05/30/2023 9.1 8.9 - 10.3 mg/dL Final         Passed - Na in normal range and within 180 days    Sodium  Date Value Ref Range Status  05/30/2023 140 135 - 145 mmol/L Final  01/19/2022 138 134 - 144 mmol/L Final         Passed - Cl in normal range and within 180 days  Chloride  Date Value Ref Range Status  05/30/2023 101 98 - 111 mmol/L Final         Passed - Valid encounter within last 6 months    Recent Outpatient Visits           2 months ago Essential hypertension   [place] [facility] at [facility], Nyoka Cowden, MD   6 months ago Essential hypertension   [place] [facility] at [facility], Nyoka Cowden, MD   1 year ago Essential hypertension   [place] [facility] at [facility], Nyoka Cowden, MD   1 year ago Acute deep vein thrombosis (DVT) of both peroneal veins [facility]   [place] [facility] at [facility], Nyoka Cowden, MD   2 years ago Essential hypertension   [place] [facility] at [facility], Nyoka Cowden, MD       Future Appointments             In 1 week Evelene Croon Atilano Median, MD [facility]             sertraline (ZOLOFT) 50 MG tablet [Pharmacy Med Name: SERTRALINE 50MG  TABLETS] 90 tablet 0    Sig: TAKE 1 TABLET(50 MG) BY MOUTH DAILY     Psychiatry:  Antidepressants - SSRI - sertraline Passed - 06/23/2023  4:04 PM      Passed - AST in normal range and within 360 days    AST  Date Value Ref Range Status  03/11/2023 18 15 - 41 U/L Final         Passed - ALT in normal range and within 360 days    ALT  Date Value Ref Range Status  03/11/2023 15 0 - 44 U/L Final         Passed - Completed PHQ-2 or PHQ-9 in the last 360 days      Passed - Valid encounter within last 6 months    Recent Outpatient Visits           2 months ago Essential hypertension   [place] [facility] at [facility], Nyoka Cowden, MD   6 months ago Essential hypertension   [place] [facility] at [facility], Nyoka Cowden, MD   1 year ago Essential hypertension   [place] [facility] at [facility], Nyoka Cowden, MD   1 year ago Acute deep vein thrombosis (DVT) of both peroneal veins [facility])   [place] [facility] at [facility], Nyoka Cowden, MD   2 years ago Essential hypertension   [place] [facility] at [facility] Nyoka Cowden, MD       Future Appointments             In 1 week Evelene Croon Atilano Median, MD  [facility]             atorvastatin (LIPITOR) 40 MG tablet [Pharmacy Med Name: ATORVASTATIN 40MG  TABLETS] 90 tablet 0    Sig: TAKE 1 TABLET(40 MG) BY MOUTH DAILY     Cardiovascular:  Antilipid - Statins  Failed - 06/23/2023  4:04 PM      Failed - Lipid Panel in normal range within the last 12 months  Cholesterol, Total  Date Value Ref Range Status  10/17/2020 162 100 - 199 mg/dL Final   LDL Chol Calc (NIH)  Date Value Ref Range Status  10/17/2020 80 0 - 99 mg/dL Final   HDL  Date Value Ref Range Status  10/17/2020 71 >39 mg/dL Final   Triglycerides  Date Value Ref Range Status  10/17/2020 52 0 - 149 mg/dL Final         Passed - Patient is not pregnant      Passed - Valid encounter within last 12 months    Recent Outpatient Visits           2 months ago Essential hypertension   [place] [facility] at [facility], Nyoka Cowden, MD   6 months ago Essential hypertension   [place] [facility] at [facility], Nyoka Cowden, MD   1 year ago Essential hypertension   [place] [facility] at [facility], Nyoka Cowden, MD   1 year ago Acute deep vein thrombosis (DVT) of both peroneal veins [facility])   [place] [facility] at [facility], Nyoka Cowden, MD   2 years ago Essential hypertension   [place] [facility] at [facility], Nyoka Cowden, MD       Future Appointments             In 1 week Evelene Croon Atilano Median, MD [place] [facility]            Refused Prescriptions Disp Refills   pantoprazole (PROTONIX) 40 MG tablet [Pharmacy Med Name: PANTOPRAZOLE 40MG  TABLETS] 90 tablet 0    Sig: TAKE 1 TABLET(40 MG) BY MOUTH DAILY     Gastroenterology: Proton Pump Inhibitors Passed - 06/23/2023  4:04 PM      Passed - Valid encounter within last 12 months    Recent Outpatient Visits           2 months ago Essential hypertension   [place] [facility], Nyoka Cowden, MD   6 months ago Essential hypertension   [place] [facility] at [facility], Nyoka Cowden, MD   1 year ago Essential hypertension   [place] [facility] at [facility], Nyoka Cowden, MD   1 year ago Acute deep vein thrombosis (DVT) of both peroneal veins [facility])   [place] [facility] at [facility], Nyoka Cowden, MD   2 years ago Essential hypertension   [place] [facility] at [facility] Nyoka Cowden, MD       Future Appointments             In 1 week Evelene Croon, Atilano Median, MD [facility], [facility]

## 2023-06-23 NOTE — Telephone Encounter (Signed)
 Requested medication (s) are due for refill today - yes  Requested medication (s) are on the active medication list -yes  Future visit scheduled -no  Last refill: 03/29/23 #90  Notes to clinic: fails lab protocol- over 1 year-10/17/20  Requested Prescriptions  Pending Prescriptions Disp Refills   atorvastatin (LIPITOR) 40 MG tablet [Pharmacy Med Name: ATORVASTATIN 40MG  TABLETS] 90 tablet 0    Sig: TAKE 1 TABLET(40 MG) BY MOUTH DAILY     Cardiovascular:  Antilipid - Statins Failed - 06/23/2023  4:04 PM      Failed - Lipid Panel in normal range within the last 12 months    Cholesterol, Total  Date Value Ref Range Status  10/17/2020 162 100 - 199 mg/dL Final   LDL Chol Calc (NIH)  Date Value Ref Range Status  10/17/2020 80 0 - 99 mg/dL Final   HDL  Date Value Ref Range Status  10/17/2020 71 >39 mg/dL Final   Triglycerides  Date Value Ref Range Status  10/17/2020 52 0 - 149 mg/dL Final         Passed - Patient is not pregnant      Passed - Valid encounter within last 12 months    Recent Outpatient Visits           2 months ago Essential hypertension   [place] [facility], Nyoka Cowden, MD   6 months ago Essential hypertension   [place] [facility] at [facility], Nyoka Cowden, MD   1 year ago Essential hypertension   [place] [facility] at [facility], Nyoka Cowden, MD   1 year ago Acute deep vein thrombosis (DVT) of both peroneal veins [facility])   [place] [facility] at [facility], Nyoka Cowden, MD   2 years ago Essential hypertension   [place] [facility] at [facility], Nyoka Cowden, MD       Future Appointments             In 1 week Evelene Croon Atilano Median, MD [facility]            Signed Prescriptions Disp Refills   diltiazem (CARDIZEM CD) 240 MG 24 hr capsule  90 capsule 0    Sig: TAKE 1 CAPSULE(240 MG) BY MOUTH DAILY     Cardiovascular: Calcium Channel Blockers 3 Failed - 06/23/2023  4:04 PM      Failed - Cr in normal range and within 360 days    Creatinine, Ser  Date Value Ref Range Status  05/30/2023 1.31 (H) 0.44 - 1.00 mg/dL Final         Failed - Last BP in normal range    BP Readings from Last 1 Encounters:  06/22/23 (!) 165/65         Passed - ALT in normal range and within 360 days    ALT  Date Value Ref Range Status  03/11/2023 15 0 - 44 U/L Final         Passed - AST in normal range and within 360 days    AST  Date Value Ref Range Status  03/11/2023 18 15 - 41 U/L Final         Passed - Last Heart Rate in normal range    Pulse Readings from Last 1 Encounters:  06/22/23 (!) 101  Passed - Valid encounter within last 6 months    Recent Outpatient Visits           2 months ago Essential hypertension   [place] [facility] at [facility], Nyoka Cowden, MD   6 months ago Essential hypertension   [place] [facility] at [facility], Nyoka Cowden, MD   1 year ago Essential hypertension   [place] [facility] at [facility], Nyoka Cowden, MD   1 year ago Acute deep vein thrombosis (DVT) of both peroneal veins [facility])   [place] [facility] at [facility], Nyoka Cowden, MD   2 years ago Essential hypertension   [place] [facility] at [facility], Nyoka Cowden, MD       Future Appointments             In 1 week Evelene Croon Atilano Median, MD [facility], [facility]             apixaban (ELIQUIS) 2.5 MG TABS tablet 180 tablet 0    Sig: TAKE 1 TABLET(2.5 MG) BY MOUTH TWICE DAILY     Hematology:  Anticoagulants - apixaban Failed - 06/23/2023  4:04 PM      Failed - HGB in normal range and within 360 days    Hemoglobin  Date Value Ref Range Status   03/11/2023 10.8 (L) 12.0 - 15.0 g/dL Final  04/54/0981 19.1 11.1 - 15.9 g/dL Final         Failed - HCT in normal range and within 360 days    HCT  Date Value Ref Range Status  03/11/2023 32.4 (L) 36.0 - 46.0 % Final   Hematocrit  Date Value Ref Range Status  01/19/2022 37.5 34.0 - 46.6 % Final         Failed - Cr in normal range and within 360 days    Creatinine, Ser  Date Value Ref Range Status  05/30/2023 1.31 (H) 0.44 - 1.00 mg/dL Final         Passed - PLT in normal range and within 360 days    Platelets  Date Value Ref Range Status  03/11/2023 179 150 - 400 K/uL Final  01/19/2022 169 150 - 450 x10E3/uL Final         Passed - AST in normal range and within 360 days    AST  Date Value Ref Range Status  03/11/2023 18 15 - 41 U/L Final         Passed - ALT in normal range and within 360 days    ALT  Date Value Ref Range Status  03/11/2023 15 0 - 44 U/L Final         Passed - Valid encounter within last 12 months    Recent Outpatient Visits           2 months ago Essential hypertension   [place] [facility] at [facility], Nyoka Cowden, MD   6 months ago Essential hypertension   [place] [facility] at [facility], Nyoka Cowden, MD   1 year ago Essential hypertension   [place] [facility] at [facility], Nyoka Cowden, MD   1 year ago Acute deep vein thrombosis (DVT) of both peroneal veins [facility])   [place] [facility]  at [facility], Nyoka Cowden, MD   2 years ago Essential hypertension   [place] [facility] at [facility], Nyoka Cowden, MD       Future Appointments             In 1 week Evelene Croon Atilano Median, MD [facility], [facility]             furosemide (LASIX) 20 MG tablet 90 tablet 0    Sig: TAKE 1 TABLET(20 MG) BY MOUTH DAILY     Cardiovascular:  Diuretics - Loop Failed  - 06/23/2023  4:04 PM      Failed - Cr in normal range and within 180 days    Creatinine, Ser  Date Value Ref Range Status  05/30/2023 1.31 (H) 0.44 - 1.00 mg/dL Final         Failed - Mg Level in normal range and within 180 days    Magnesium  Date Value Ref Range Status  10/13/2022 1.9 1.7 - 2.4 mg/dL Final    Comment:    Performed at [facility], [address] [place], [place] [address]         Failed - Last BP in normal range    BP Readings from Last 1 Encounters:  06/22/23 (!) 165/65         Passed - K in normal range and within 180 days    Potassium  Date Value Ref Range Status  05/30/2023 4.2 3.5 - 5.1 mmol/L Final         Passed - Ca in normal range and within 180 days    Calcium  Date Value Ref Range Status  05/30/2023 9.1 8.9 - 10.3 mg/dL Final         Passed - Na in normal range and within 180 days    Sodium  Date Value Ref Range Status  05/30/2023 140 135 - 145 mmol/L Final  01/19/2022 138 134 - 144 mmol/L Final         Passed - Cl in normal range and within 180 days    Chloride  Date Value Ref Range Status  05/30/2023 101 98 - 111 mmol/L Final         Passed - Valid encounter within last 6 months    Recent Outpatient Visits           2 months ago Essential hypertension   [place] [facility] at [facility], Nyoka Cowden, MD   6 months ago Essential hypertension   [place] [facility] at [facility], Nyoka Cowden, MD   1 year ago Essential hypertension   [place] [facility] at [facility], Nyoka Cowden, MD   1 year ago Acute deep vein thrombosis (DVT) of both peroneal veins [facility])   [place] [facility] at [facility], Nyoka Cowden, MD   2 years ago Essential hypertension   [place] [facility] at [facility], Nyoka Cowden, MD       Future Appointments             In 1 week  Evelene Croon Atilano Median, MD  [facility], [facility]             sertraline (ZOLOFT) 50 MG tablet 90 tablet 0    Sig: TAKE 1 TABLET(50 MG) BY MOUTH DAILY  Psychiatry:  Antidepressants - SSRI - sertraline Passed - 06/23/2023  4:04 PM      Passed - AST in normal range and within 360 days    AST  Date Value Ref Range Status  03/11/2023 18 15 - 41 U/L Final         Passed - ALT in normal range and within 360 days    ALT  Date Value Ref Range Status  03/11/2023 15 0 - 44 U/L Final         Passed - Completed PHQ-2 or PHQ-9 in the last 360 days      Passed - Valid encounter within last 6 months    Recent Outpatient Visits           2 months ago Essential hypertension   [place] [facility] at [facility], Nyoka Cowden, MD   6 months ago Essential hypertension   [place] [facility] at [facility], Nyoka Cowden, MD   1 year ago Essential hypertension   [place] [facility] at [facility], Nyoka Cowden, MD   1 year ago Acute deep vein thrombosis (DVT) of both peroneal veins [facility])   [place] [facility] at [facility], Nyoka Cowden, MD   2 years ago Essential hypertension   [place] [facility] at [facility], Nyoka Cowden, MD       Future Appointments             In 1 week Evelene Croon, Atilano Median, MD [place] [facility], [facility]            Refused Prescriptions Disp Refills   pantoprazole (PROTONIX) 40 MG tablet [Pharmacy Med Name: PANTOPRAZOLE 40MG  TABLETS] 90 tablet 0    Sig: TAKE 1 TABLET(40 MG) BY MOUTH DAILY     Gastroenterology: Proton Pump Inhibitors Passed - 06/23/2023  4:04 PM      Passed - Valid encounter within last 12 months    Recent Outpatient Visits           2 months ago Essential hypertension   [place] [facility] at [facility], Nyoka Cowden, MD    6 months ago Essential hypertension   [place] [facility] at [facility], Nyoka Cowden, MD   1 year ago Essential hypertension   [place] [facility] at [facility], Nyoka Cowden, MD   1 year ago Acute deep vein thrombosis (DVT) of both peroneal veins [facility])   [place] [facility] at [facility], Nyoka Cowden, MD   2 years ago Essential hypertension   [place] [facility] at [facility], Nyoka Cowden, MD       Future Appointments             In 1 week Evelene Croon Atilano Median, MD  [facility]               Requested Prescriptions  Pending Prescriptions Disp Refills   atorvastatin (LIPITOR) 40 MG tablet [Pharmacy Med Name: ATORVASTATIN 40MG  TABLETS] 90 tablet 0    Sig: TAKE 1 TABLET(40 MG) BY MOUTH DAILY     Cardiovascular:  Antilipid - Statins Failed - 06/23/2023  4:04 PM      Failed - Lipid Panel in normal range within  the last 12 months    Cholesterol, Total  Date Value Ref Range Status  10/17/2020 162 100 - 199 mg/dL Final   LDL Chol Calc (NIH)  Date Value Ref Range Status  10/17/2020 80 0 - 99 mg/dL Final   HDL  Date Value Ref Range Status  10/17/2020 71 >39 mg/dL Final   Triglycerides  Date Value Ref Range Status  10/17/2020 52 0 - 149 mg/dL Final         Passed - Patient is not pregnant      Passed - Valid encounter within last 12 months    Recent Outpatient Visits           2 months ago Essential hypertension   [place] [facility] at [facility], Nyoka Cowden, MD   6 months ago Essential hypertension   [place] [facility] at [facility], Nyoka Cowden, MD   1 year ago Essential hypertension   [place] [facility] at [facility], Nyoka Cowden, MD   1 year ago Acute deep vein thrombosis (DVT) of both peroneal veins  [facility])   [place] [facility] at [facility], Nyoka Cowden, MD   2 years ago Essential hypertension   [place] [facility] at [facility], Nyoka Cowden, MD       Future Appointments             In 1 week Evelene Croon, Atilano Median, MD [facility], [facility]            Signed Prescriptions Disp Refills   diltiazem (CARDIZEM CD) 240 MG 24 hr capsule 90 capsule 0    Sig: TAKE 1 CAPSULE(240 MG) BY MOUTH DAILY     Cardiovascular: Calcium Channel Blockers 3 Failed - 06/23/2023  4:04 PM      Failed - Cr in normal range and within 360 days    Creatinine, Ser  Date Value Ref Range Status  05/30/2023 1.31 (H) 0.44 - 1.00 mg/dL Final         Failed - Last BP in normal range    BP Readings from Last 1 Encounters:  06/22/23 (!) 165/65         Passed - ALT in normal range and within 360 days    ALT  Date Value Ref Range Status  03/11/2023 15 0 - 44 U/L Final         Passed - AST in normal range and within 360 days    AST  Date Value Ref Range Status  03/11/2023 18 15 - 41 U/L Final         Passed - Last Heart Rate in normal range    Pulse Readings from Last 1 Encounters:  06/22/23 (!) 101         Passed - Valid encounter within last 6 months    Recent Outpatient Visits           2 months ago Essential hypertension   [place] [facility] at [facility], Nyoka Cowden, MD   6 months ago Essential hypertension   [place] [facility] at [facility], Nyoka Cowden, MD   1 year ago Essential hypertension   [place] [facility] at [facility], Nyoka Cowden, MD   1 year ago Acute deep vein thrombosis (DVT)  of both peroneal veins [facility])   [place] [facility] at [facility], Nyoka Cowden, MD   2 years ago Essential hypertension   [place] [facility] at [facility], Nyoka Cowden, MD       Future Appointments             In 1 week Evelene Croon, Atilano Median, MD [place] [facility], [facility]             apixaban (ELIQUIS) 2.5 MG TABS tablet 180 tablet 0    Sig: TAKE 1 TABLET(2.5 MG) BY MOUTH TWICE DAILY     Hematology:  Anticoagulants - apixaban Failed - 06/23/2023  4:04 PM      Failed - HGB in normal range and within 360 days    Hemoglobin  Date Value Ref Range Status  03/11/2023 10.8 (L) 12.0 - 15.0 g/dL Final  16/02/9603 54.0 11.1 - 15.9 g/dL Final         Failed - HCT in normal range and within 360 days    HCT  Date Value Ref Range Status  03/11/2023 32.4 (L) 36.0 - 46.0 % Final   Hematocrit  Date Value Ref Range Status  01/19/2022 37.5 34.0 - 46.6 % Final         Failed - Cr in normal range and within 360 days    Creatinine, Ser  Date Value Ref Range Status  05/30/2023 1.31 (H) 0.44 - 1.00 mg/dL Final         Passed - PLT in normal range and within 360 days    Platelets  Date Value Ref Range Status  03/11/2023 179 150 - 400 K/uL Final  01/19/2022 169 150 - 450 x10E3/uL Final         Passed - AST in normal range and within 360 days    AST  Date Value Ref Range Status  03/11/2023 18 15 - 41 U/L Final         Passed - ALT in normal range and within 360 days    ALT  Date Value Ref Range Status  03/11/2023 15 0 - 44 U/L Final         Passed - Valid encounter within last 12 months    Recent Outpatient Visits           2 months ago Essential hypertension   [place] [facility] at [facility], Nyoka Cowden, MD   6 months ago Essential hypertension   [place] [facility] at [facility], Nyoka Cowden, MD   1 year ago Essential hypertension   [place] [facility] at [facility], Nyoka Cowden, MD   1 year ago Acute deep vein thrombosis (DVT) of both peroneal veins [facility])   [place] [facility] at [facility], Nyoka Cowden, MD   2 years ago Essential hypertension   [place] [facility] at [facility], Nyoka Cowden, MD       Future Appointments             In 1 week Evelene Croon Atilano Median, MD  [facility]             furosemide (LASIX) 20 MG tablet 90 tablet 0    Sig: TAKE 1 TABLET(20 MG) BY MOUTH DAILY     Cardiovascular:  Diuretics - Loop Failed - 06/23/2023  4:04 PM      Failed - Cr in normal range and within 180 days    Creatinine, Ser  Date Value Ref Range Status  05/30/2023 1.31 (H) 0.44 - 1.00 mg/dL Final         Failed - Mg Level in normal range and within 180 days    Magnesium  Date Value Ref Range Status  10/13/2022 1.9 1.7 - 2.4 mg/dL Final    Comment:    Performed at [facility], [address] [place], [place] [address]         Failed - Last BP in normal range    BP Readings from Last 1 Encounters:  06/22/23 (!) 165/65         Passed - K in normal range and within 180 days    Potassium  Date Value Ref Range Status  05/30/2023 4.2 3.5 - 5.1 mmol/L Final         Passed - Ca in normal range and within 180 days    Calcium  Date Value Ref Range Status  05/30/2023 9.1 8.9 - 10.3 mg/dL Final         Passed - Na in normal range and within 180 days    Sodium  Date Value Ref Range Status  05/30/2023 140 135 - 145 mmol/L Final  01/19/2022 138 134 - 144 mmol/L Final         Passed - Cl in normal range and within 180 days    Chloride  Date Value Ref Range Status  05/30/2023 101 98 - 111 mmol/L Final         Passed - Valid encounter within last 6 months    Recent Outpatient Visits           2 months ago Essential hypertension   [place] [facility] at [facility], Nyoka Cowden, MD   6 months ago Essential hypertension   [place] [facility] at [facility], Nyoka Cowden, MD   1 year ago Essential  hypertension   [place] [facility] at [facility], Nyoka Cowden, MD   1 year ago Acute deep vein thrombosis (DVT) of both peroneal veins [facility])   [place] [facility] at [facility], Nyoka Cowden, MD   2 years ago Essential hypertension    [place] [facility] at [facility], Nyoka Cowden, MD       Future Appointments             In 1 week Evelene Croon Atilano Median, MD [place] [facility], [facility]             sertraline (ZOLOFT) 50 MG tablet 90 tablet 0    Sig: TAKE 1 TABLET(50 MG) BY MOUTH DAILY     Psychiatry:  Antidepressants - SSRI - sertraline Passed - 06/23/2023  4:04 PM      Passed - AST in normal range and within 360 days    AST  Date Value Ref Range Status  03/11/2023 18 15 - 41 U/L Final         Passed - ALT in normal range and within 360 days    ALT  Date Value Ref Range Status  03/11/2023 15 0 - 44 U/L Final         Passed - Completed PHQ-2 or PHQ-9 in the  last 360 days      Passed - Valid encounter within last 6 months    Recent Outpatient Visits           2 months ago Essential hypertension   [place] [facility] at [facility], Nyoka Cowden, MD   6 months ago Essential hypertension   [place] [facility] at [facility], Nyoka Cowden, MD   1 year ago Essential hypertension   [place] [facility] at [facility], Nyoka Cowden, MD   1 year ago Acute deep vein thrombosis (DVT) of both peroneal veins [facility])   [place] [facility] at [facility], Nyoka Cowden, MD   2 years ago Essential hypertension   [place] [facility] at [facility], Nyoka Cowden, MD       Future Appointments             In 1 week Evelene Croon Atilano Median, MD [place] [facility], [facility]            Refused Prescriptions Disp  Refills   pantoprazole (PROTONIX) 40 MG tablet [Pharmacy Med Name: PANTOPRAZOLE 40MG  TABLETS] 90 tablet 0    Sig: TAKE 1 TABLET(40 MG) BY MOUTH DAILY     Gastroenterology: Proton Pump Inhibitors Passed - 06/23/2023  4:04 PM      Passed - Valid encounter within last 12 months    Recent Outpatient Visits           2 months ago Essential hypertension   [place] [facility], Nyoka Cowden, MD   6 months ago Essential hypertension   [place] [facility] at [facility], Nyoka Cowden, MD   1 year ago Essential hypertension   [place] [facility] at [facility], Nyoka Cowden, MD   1 year ago Acute deep vein thrombosis (DVT) of both peroneal veins [facility])   [place] [facility] at [facility], Nyoka Cowden, MD   2 years ago Essential hypertension    Primary Care [facility] Sports Medicine at [facility], Nyoka Cowden, MD       Future Appointments             In 1 week Evelene Croon, Atilano Median, MD [facility], [facility]

## 2023-07-06 ENCOUNTER — Emergency Department: Payer: Medicare Other

## 2023-07-06 ENCOUNTER — Emergency Department
Admission: EM | Admit: 2023-07-06 | Discharge: 2023-07-07 | Disposition: A | Discharge status: Home / Self Care | Payer: Medicare Other | Attending: Emergency Medicine | Admitting: Emergency Medicine

## 2023-07-06 ENCOUNTER — Other Ambulatory Visit: Payer: Self-pay

## 2023-07-06 ENCOUNTER — Ambulatory Visit: Payer: Self-pay | Admitting: Internal Medicine

## 2023-07-06 ENCOUNTER — Ambulatory Visit: Payer: Medicare Other | Admitting: Pediatrics

## 2023-07-06 ENCOUNTER — Encounter: Payer: Medicare Other | Admitting: Physician Assistant

## 2023-07-06 DIAGNOSIS — R1084 Generalized abdominal pain: Secondary | ICD-10-CM | POA: Insufficient documentation

## 2023-07-06 DIAGNOSIS — R197 Diarrhea, unspecified: Secondary | ICD-10-CM | POA: Diagnosis not present

## 2023-07-06 DIAGNOSIS — R140 Abdominal distension (gaseous): Secondary | ICD-10-CM | POA: Diagnosis not present

## 2023-07-06 DIAGNOSIS — I447 Left bundle-branch block, unspecified: Secondary | ICD-10-CM | POA: Diagnosis not present

## 2023-07-06 DIAGNOSIS — K449 Diaphragmatic hernia without obstruction or gangrene: Secondary | ICD-10-CM | POA: Diagnosis not present

## 2023-07-06 DIAGNOSIS — K838 Other specified diseases of biliary tract: Secondary | ICD-10-CM | POA: Diagnosis not present

## 2023-07-06 DIAGNOSIS — R112 Nausea with vomiting, unspecified: Secondary | ICD-10-CM | POA: Diagnosis not present

## 2023-07-06 DIAGNOSIS — R531 Weakness: Secondary | ICD-10-CM | POA: Diagnosis not present

## 2023-07-06 DIAGNOSIS — R109 Unspecified abdominal pain: Secondary | ICD-10-CM | POA: Diagnosis not present

## 2023-07-06 DIAGNOSIS — R079 Chest pain, unspecified: Secondary | ICD-10-CM | POA: Diagnosis not present

## 2023-07-06 DIAGNOSIS — R0689 Other abnormalities of breathing: Secondary | ICD-10-CM | POA: Diagnosis not present

## 2023-07-06 DIAGNOSIS — N2889 Other specified disorders of kidney and ureter: Secondary | ICD-10-CM | POA: Diagnosis not present

## 2023-07-06 LAB — HEPATIC FUNCTION PANEL
ALT: 217 U/L — ABNORMAL HIGH (ref 0–44)
AST: 337 U/L — ABNORMAL HIGH (ref 15–41)
Albumin: 3.6 g/dL (ref 3.5–5.0)
Alkaline Phosphatase: 132 U/L — ABNORMAL HIGH (ref 38–126)
Bilirubin, Direct: 0.3 mg/dL — ABNORMAL HIGH (ref 0.0–0.2)
Indirect Bilirubin: 1.2 mg/dL — ABNORMAL HIGH (ref 0.3–0.9)
Total Bilirubin: 1.5 mg/dL — ABNORMAL HIGH (ref 0.0–1.2)
Total Protein: 7.2 g/dL (ref 6.5–8.1)

## 2023-07-06 LAB — BASIC METABOLIC PANEL
Anion gap: 5 (ref 5–15)
BUN: 19 mg/dL (ref 8–23)
CO2: 28 mmol/L (ref 22–32)
Calcium: 9 mg/dL (ref 8.9–10.3)
Chloride: 104 mmol/L (ref 98–111)
Creatinine, Ser: 1.2 mg/dL — ABNORMAL HIGH (ref 0.44–1.00)
GFR, Estimated: 44 mL/min — ABNORMAL LOW (ref >60)
Glucose, Bld: 117 mg/dL — ABNORMAL HIGH (ref 70–99)
Potassium: 3.7 mmol/L (ref 3.5–5.1)
Sodium: 137 mmol/L (ref 135–145)

## 2023-07-06 LAB — CBC
HCT: 36.1 % (ref 36.0–46.0)
Hemoglobin: 12 g/dL (ref 12.0–15.0)
MCH: 33.5 pg (ref 26.0–34.0)
MCHC: 33.2 g/dL (ref 30.0–36.0)
MCV: 100.8 fL — ABNORMAL HIGH (ref 80.0–100.0)
Platelets: 191 10*3/uL (ref 150–400)
RBC: 3.58 MIL/uL — ABNORMAL LOW (ref 3.87–5.11)
RDW: 13.9 % (ref 11.5–15.5)
WBC: 5 10*3/uL (ref 4.0–10.5)
nRBC: 0 % (ref 0.0–0.2)

## 2023-07-06 LAB — TROPONIN I (HIGH SENSITIVITY)
Troponin I (High Sensitivity): 9 ng/L (ref <18)
Troponin I (High Sensitivity): 10 ng/L (ref <18)

## 2023-07-06 LAB — LIPASE, BLOOD: Lipase: 34 U/L (ref 11–51)

## 2023-07-06 MED ORDER — SODIUM CHLORIDE 0.9 % IV BOLUS
500.0000 mL | Freq: Once | INTRAVENOUS | Status: AC
  Administered 2023-07-06: 500 mL via INTRAVENOUS

## 2023-07-06 MED ORDER — IOHEXOL 300 MG/ML  SOLN
75.0000 mL | Freq: Once | INTRAMUSCULAR | Status: AC | PRN
  Administered 2023-07-06: 75 mL via INTRAVENOUS

## 2023-07-06 NOTE — ED Triage Notes (Signed)
 First Nurse Note: Patient to ED via [facility] from home for N/V/D that started yesterday with abd pain. Pain radiating into chest- reproducible. Gallbladder removed 3 weeks ago- having discharge from wound.   141 cbg 136/74 86 HR 92% RA- placed on 3L 98% for comfort  20 L Hand

## 2023-07-06 NOTE — ED Provider Notes (Signed)
 [facility] Provider Note    Event Date/Time   First MD Initiated Contact with Patient 07/06/23 2015     (approximate)   History   Nausea and vomiting  HPI  Vanessa Farmer is a 88 y.o. female presents to the urgency department today because of concerns for nausea and vomiting.  The symptoms started yesterday.  She has had multiple episodes of vomiting.  She states she has had decreased oral intake.  Patient did undergo cholecystectomy about 3 weeks ago.  She has been having abdominal discomfort since then.  Does not appear that this is gotten worse.  Additionally she has noticed continued drainage from right percutaneous tract.  Patient denies any fevers although has had some chills.     Physical Exam   Triage Vital Signs: ED Triage Vitals  Encounter Vitals Group     BP 07/06/23 1320 (!) 125/58     Systolic BP Percentile --      Diastolic BP Percentile --      Pulse Rate 07/06/23 1320 88     Resp 07/06/23 1320 19     Temp 07/06/23 1320 97.7 F (36.5 C)     Temp Source 07/06/23 1320 Oral     SpO2 07/06/23 1320 93 %     Weight 07/06/23 1317 113 lb 5.1 oz (51.4 kg)     Height 07/06/23 1317 5' (1.524 m)     Head Circumference --      Peak Flow --      Pain Score 07/06/23 1317 10     Pain Loc --      Pain Education --      Exclude from Growth Chart --     Most recent vital signs: Vitals:   07/06/23 1320 07/06/23 1836  BP: (!) 125/58 121/70  Pulse: 88 78  Resp: 19 17  Temp: 97.7 F (36.5 C) 97.8 F (36.6 C)  SpO2: 93% 97%   General: Awake, alert, oriented. CV:  Good peripheral perfusion. Regular rate and rhythm. Resp:  Normal effort. Lungs clear. Abd:  No distention. Diffusely tender to palpation.    ED Results / Procedures / Treatments   Labs (all labs ordered are listed, but only abnormal results are displayed) Labs Reviewed  BASIC METABOLIC PANEL - Abnormal; Notable for the following components:      Result Value   Glucose, Bld  117 (*)    Creatinine, Ser 1.20 (*)    GFR, Estimated 44 (*)    All other components within normal limits  CBC - Abnormal; Notable for the following components:   RBC 3.58 (*)    MCV 100.8 (*)    All other components within normal limits  HEPATIC FUNCTION PANEL - Abnormal; Notable for the following components:   AST 337 (*)    ALT 217 (*)    Alkaline Phosphatase 132 (*)    Total Bilirubin 1.5 (*)    Bilirubin, Direct 0.3 (*)    Indirect Bilirubin 1.2 (*)    All other components within normal limits  LIPASE, BLOOD  URINALYSIS, ROUTINE W REFLEX MICROSCOPIC  TROPONIN I (HIGH SENSITIVITY)  TROPONIN I (HIGH SENSITIVITY)     EKG  I, Phineas Semen, attending physician, personally viewed and interpreted this EKG  EKG Time: 1319 Rate: 90 Rhythm: normal sinus rhythm Axis: left axis deviation Intervals: qtc 506 QRS: LBBB ST changes: no st elevation Impression: abnormal ekg   RADIOLOGY I independently interpreted and visualized the CXR. My interpretation:  Large hernia. No pneumonia Radiology interpretation:  IMPRESSION:  1. Large hiatal hernia, increased in size since prior study. The  hernia peers to contain distended gas-filled transverse colon as  well.  2. No acute airspace disease.  3. Evaluation limited by patient positioning.      PROCEDURES:  Critical Care performed: No   MEDICATIONS ORDERED IN ED: Medications - No data to display   IMPRESSION / MDM / ASSESSMENT AND PLAN / ED COURSE  I reviewed the triage vital signs and the nursing notes.                              Differential diagnosis includes, but is not limited to, post op complication, gastroenteritis, obstruction  Patient's presentation is most consistent with acute presentation with potential threat to life or bodily function.   Patient presented to the emergency department today with concerns for nausea vomiting and diarrhea.  On exam patient's abdomen was slightly diffusely tender to  palpation.  She did undergo cholecystectomy a little over 1 month ago.  Blood work and CT abdomen pelvis were obtained.  Blood work without concerning leukocytosis.  His CT did show small fluid collection in the gallbladder fossa.  I discussed with Dr. Claudine Mouton with surgery.  At this time he felt this was a normal postoperative finding I did not feel patient needed observation for this or the LFT elevation.  Patient felt better after IV fluids.  At this time I think likely gastroenteritis.  Will plan on discharging.  Did discuss with patient importance of close surgery follow-up.     FINAL CLINICAL IMPRESSION(S) / ED DIAGNOSES   Final diagnoses:  Nausea vomiting and diarrhea     Note:  This document was prepared using Dragon voice recognition software and may include unintentional dictation errors.    Phineas Semen, MD 07/06/23 [phone]

## 2023-07-06 NOTE — ED Triage Notes (Signed)
 Pt here with weakness and abd pain. Pt states pain is centered and is sharp. Pt states she had NVD all day yesterday. Pt had her gallbladder removed 3 weeks ago.

## 2023-07-06 NOTE — Progress Notes (Signed)
   07/06/23 1830  Spiritual Encounters  Type of Visit Initial  Care provided to: Pt and family  Reason for visit Routine spiritual support  OnCall Visit Yes   Chaplain offered a compassionate presence, empathy and reflective listening to patient and daughter while they waited in the ED Lobby.    Rev. Rana M. Earlene Plater, MDiv Chaplain Resident [facility]

## 2023-07-06 NOTE — Discharge Instructions (Addendum)
 Please seek medical attention for any high fevers, chest pain, shortness of breath, change in behavior, persistent vomiting, bloody stool or any other new or concerning symptoms.

## 2023-07-06 NOTE — Telephone Encounter (Signed)
  Chief Complaint: Vomiting Symptoms: vomiting, diarrhea, abdominal pain, pain around incision, low grade fever Frequency: Began yesterday Pertinent Negatives: Patient denies CP, SOB, blood in stool or emesis Disposition: [x] ED /[] Urgent Care (no appt availability in office) / [] Appointment(In office/virtual)/ []  [facility]/ [] Home Care/ [] Refused Recommended Disposition /[] [place]/ []  Follow-up with PCP Additional Notes: Patient's daughter, Steward Drone on [place], calls reporting the patient has vomited well over six times in the last 24 hours, is experiencing significant diarrhea as well, low grade fever that began yesterday. She reports the patient recently had surgery and there are concerns for the wound as well as reports patient had a fall last week. Patient was scheduled to be seen for [facility] today, appt rescheduled at caller request. Per protocol, patient to present to ED now for evaluation based of symptoms. Care advice reviewed, patient verbalized understanding and denies further questions at this time.  Alerting PCP for review.    Copied from CRM [id number]. Topic: Clinical - Red Word Triage >> Jul 06, 2023  7:41 AM Payton Doughty wrote: Red Word that prompted transfer to Nurse Triage: vomiting/diarrhea/nausea all night Reason for Disposition  [1] SEVERE vomiting (e.g., 6 or more times/day) AND [2] present > 8 hours (Exception: Patient sounds well, is drinking liquids, does not sound dehydrated, and vomiting has lasted less than 24 hours.)  Answer Assessment - Initial Assessment Questions 1. VOMITING SEVERITY: "How many times have you vomited in the past 24 hours?"     - MILD:  1 - 2 times/day    - MODERATE: 3 - 5 times/day, decreased oral intake without significant weight loss or symptoms of dehydration    - SEVERE: 6 or more times/day, vomits everything or nearly everything, with significant weight loss, symptoms of dehydration      Greater than 6 2. ONSET: "When  did the vomiting begin?"      Yesterday evening 3. FLUIDS: "What fluids or food have you vomited up today?" "Have you been able to keep any fluids down?"     Drinking fluids, not eating much 4. ABDOMEN PAIN: "Are your having any abdomen pain?" If Yes : "How bad is it and what does it feel like?" (e.g., crampy, dull, intermittent, constant)      Yes, near incision from gallbladder surgery 5. DIARRHEA: "Is there any diarrhea?" If Yes, ask: "How many times today?"      Yes, in the last hour she has gone twice, watery 6. CONTACTS: "Is there anyone else in the family with the same symptoms?"      Denies 7. CAUSE: "What do you think is causing your vomiting?"     Unsure if it is a virus or complications from surgery 8. HYDRATION STATUS: "Any signs of dehydration?" (e.g., dry mouth [not only dry lips], too weak to stand) "When did you last urinate?"     Denies, but states she had a fall last week Tuesday. 9. OTHER SYMPTOMS: "Do you have any other symptoms?" (e.g., fever, headache, vertigo, vomiting blood or coffee grounds, recent head injury)     Low grade fever two days ago 10. PREGNANCY: "Is there any chance you are pregnant?" "When was your last menstrual period?"       NA  Protocols used: Vomiting-A-AH

## 2023-07-08 ENCOUNTER — Telehealth: Payer: Self-pay

## 2023-07-08 NOTE — Transitions of Care (Post Inpatient/ED Visit) (Signed)
 07/08/2023  Name: Vanessa Farmer MRN: [id number] DOB: February 16, 1936  Today's TOC FU Call Status: Today's TOC FU Call Status:: Successful TOC FU Call Completed TOC FU Call Complete Date: 07/08/23 Patient's Name and Date of Birth confirmed.  Transition Care Management Follow-up Telephone Call Date of Discharge: 07/07/23 Discharge Facility: [facility]) Type of Discharge: Emergency Department Reason for ED Visit: Other: (Nausea vomiting and diarrhea) How have you been since you were released from the hospital?: Better Any questions or concerns?: No  Items Reviewed: Did you receive and understand the discharge instructions provided?: Yes Medications obtained,verified, and reconciled?: Yes (Medications Reviewed) Any new allergies since your discharge?: No Dietary orders reviewed?: NA Do you have support at home?: Yes People in Home: child(ren), adult  Medications Reviewed Today: Medications Reviewed Today     Reviewed by Leigh Aurora, CMA (Certified Medical Assistant) on 07/08/23 at 1024  Med List Status: <None>   Medication Order Taking? Sig Documenting Provider Last Dose Status Informant  acetaminophen (TYLENOL) 500 MG tablet [id number] No Take 2 tablets (1,000 mg total) by mouth every 6 (six) hours as needed for mild pain (pain score 1-3). Henrene Dodge, MD Taking Active   apixaban (ELIQUIS) 2.5 MG TABS tablet [id number]  TAKE 1 TABLET(2.5 MG) BY MOUTH TWICE DAILY Reubin Milan, MD  Active   atorvastatin (LIPITOR) 40 MG tablet [id number]  TAKE 1 TABLET(40 MG) BY MOUTH DAILY Reubin Milan, MD  Active   diltiazem (CARDIZEM CD) 240 MG 24 hr capsule [id number]  TAKE 1 CAPSULE(240 MG) BY MOUTH DAILY Reubin Milan, MD  Active   ENSURE [facility]) [id number] No Take 237 mLs by mouth 2 (two) times daily between meals.  Patient taking differently: Take 237 mLs by mouth 3 (three) times daily between meals.   Reubin Milan, MD Morning Expired 05/27/23  2359 Family Member  furosemide (LASIX) 20 MG tablet [id number]  TAKE 1 TABLET(20 MG) BY MOUTH DAILY Reubin Milan, MD  Active   lisinopril (ZESTRIL) 20 MG tablet [id number] No Take 1 tablet (20 mg total) by mouth daily.  Patient taking differently: Take 20 mg by mouth every morning.   Reubin Milan, MD Taking Active Family Member  Multiple Vitamins-Calcium (ONE-A-DAY WOMENS PO) [id number] No Take 1 tablet by mouth 2 (two) times daily. [provider] Taking Active Family Member  oxyCODONE (OXY IR/ROXICODONE) 5 MG immediate release tablet [id number] No Take 1 tablet (5 mg total) by mouth every 6 (six) hours as needed for severe pain (pain score 7-10). Henrene Dodge, MD Taking Active   sertraline (ZOLOFT) 50 MG tablet [id number]  TAKE 1 TABLET(50 MG) BY MOUTH DAILY Reubin Milan, MD  Active             Home Care and Equipment/Supplies: Were Home Health Services Ordered?: NA Any new equipment or medical supplies ordered?: NA  Functional Questionnaire: Do you need assistance with bathing/showering or dressing?: No Do you need assistance with meal preparation?: No Do you need assistance with eating?: No Do you have difficulty maintaining continence: No Do you need assistance with getting out of bed/getting out of a chair/moving?: No Do you have difficulty managing or taking your medications?: No  Follow up appointments reviewed: PCP Follow-up appointment confirmed?: Yes (declined will wait til April appointment) Date of PCP follow-up appointment?: 07/12/23 Follow-up Provider: Bari Edward, MD Specialist Hospital Follow-up appointment confirmed?: NA Do you need transportation to your follow-up appointment?: No Do you understand care options if  your condition(s) worsen?: Yes-patient verbalized understanding    SIGNATURE Agnes Lawrence, CMA (AAMA)  [facility] [phone]

## 2023-07-12 ENCOUNTER — Inpatient Hospital Stay: Payer: Medicare Other | Admitting: Internal Medicine

## 2023-07-12 ENCOUNTER — Telehealth: Payer: Self-pay

## 2023-07-12 NOTE — Telephone Encounter (Signed)
 Attempted to contact patient's daughter Steward Drone, to ask if patient has received a follow up appointment with surgeon after her recent surgery. There was no answer to my call. Will call back later.

## 2023-07-13 NOTE — Telephone Encounter (Signed)
 Attempted to contact patient's daughter Steward Drone, to ask if patient has received a follow up appointment with surgeon after her recent surgery once again. There was no answer to my call.

## 2023-07-14 NOTE — Telephone Encounter (Signed)
 Attempted to contact patient's daughter Steward Drone, to ask if patient has received a follow up appointment with surgeon after her recent surgery once again. There was no answer to my call. No VM was left.

## 2023-07-18 ENCOUNTER — Encounter: Admitting: Surgery

## 2023-07-20 ENCOUNTER — Encounter: Admitting: Surgery

## 2023-08-15 ENCOUNTER — Encounter: Admitting: Surgery

## 2023-08-19 ENCOUNTER — Ambulatory Visit: Payer: Medicare Other | Admitting: Pediatrics

## 2023-09-21 ENCOUNTER — Encounter: Payer: Self-pay | Admitting: Surgery

## 2023-09-21 ENCOUNTER — Ambulatory Visit (INDEPENDENT_AMBULATORY_CARE_PROVIDER_SITE_OTHER): Admitting: Surgery

## 2023-09-21 VITALS — BP 163/72 | HR 80 | Temp 97.8°F | Ht 60.0 in | Wt 117.4 lb

## 2023-09-21 DIAGNOSIS — L929 Granulomatous disorder of the skin and subcutaneous tissue, unspecified: Secondary | ICD-10-CM

## 2023-09-21 DIAGNOSIS — K810 Acute cholecystitis: Secondary | ICD-10-CM

## 2023-09-21 DIAGNOSIS — Z09 Encounter for follow-up examination after completed treatment for conditions other than malignant neoplasm: Secondary | ICD-10-CM | POA: Diagnosis not present

## 2023-09-21 DIAGNOSIS — T148XXA Other injury of unspecified body region, initial encounter: Secondary | ICD-10-CM

## 2023-09-21 NOTE — Patient Instructions (Addendum)
 Please change bandage daily.   Minimally Invasive Cholecystectomy, Care After What can I expect after the procedure? After the procedure, it is common to: Have pain at the areas of surgery. You will be given medicines for pain. Vomit or feel like you may vomit. Feel fullness in the belly (bloating) or have pain in the shoulder. This comes from the gas that was used during the surgery. Follow these instructions at home: Medicines Take over-the-counter and prescription medicines only as told by your doctor. If you were prescribed an antibiotic medicine, take it as told by your doctor. Do not stop taking it even if you start to feel better. If told, take steps to prevent problems with pooping (constipation). You may need to: Drink enough fluid to keep your pee (urine) pale yellow. Take medicines. You will be told what medicines to take. Eat foods that are high in fiber. These include beans, whole grains, and fresh fruits and vegetables. Limit foods that are high in fat and sugar. These include fried or sweet foods. Ask your doctor if you should avoid driving or using machines while you are taking your medicine. Incision care  Follow instructions from your doctor about how to take care of your cuts from surgery (incisions). Make sure you: Wash your hands with soap and water  for at least 20 seconds before and after you change your bandage (dressing). If you cannot use soap and water , use hand sanitizer. Change your bandage. Leave stitches (sutures) or skin glue in place for at least 2 weeks. Leave tape strips alone unless you are told to take them off. You may trim the edges of the tape strips if they curl up. Do not take baths, swim, or use a hot tub. Ask your doctor about taking showers or sponge baths. Check your incision area every day for signs of infection. Check for: More redness, swelling, or pain. Fluid or blood. Warmth. Pus or a bad smell. Activity Rest as told by your doctor.  Do not do activities that require a lot of effort. Get up to take short walks every 1 to 2 hours. Ask for help if you feel weak or unsteady. Do not lift anything that is heavier than 10 lb (4.5 kg), or the limit that you are told. Do not play contact sports until your doctor says it is okay. Do not return to work or school until your doctor says it is okay. Return to your normal activities when your doctor says that it is safe. General instructions If you were given a sedative during your procedure, do not drive or use machines until your doctor says that it is safe. A sedative is a medicine that helps you relax. Keep all follow-up visits. Contact a doctor if: You get a rash. You have more redness, swelling, or pain around your incisions. You have fluid or blood coming from your incisions. Your incisions feel warm to the touch. You have pus or a bad smell coming from your incisions. You have a fever. One or more of your incisions breaks open. Get help right away if: You have trouble breathing. You have chest pain. You have pain that is getting worse in your shoulders. You faint or feel dizzy when you stand. You have very bad pain in your belly (abdomen). You feel like you may vomit or you vomit, and this lasts for more than one day. You have leg pain. These symptoms may be an emergency. Get help right away. Call 911. Do not wait  to see if the symptoms will go away. Do not drive yourself to the hospital. Summary After your surgery, it is common to have pain at the areas of surgery. You may also vomit or feel fullness in the belly. Follow your doctor's instructions about medicine, activity restrictions, and caring for your surgery areas. Do not do activities that require a lot of effort. Contact a doctor if you have a fever or other signs of infection, such as more redness, swelling, or pain around your incisions. Get help right away if you have chest pain, increasing pain in the  shoulders, or trouble breathing. This information is not intended to replace advice given to you by your health care provider. Make sure you discuss any questions you have with your health care provider. Document Revised: 10/27/2020 Document Reviewed: 10/28/2020 Elsevier Patient Education  2024 [employer].

## 2023-09-21 NOTE — Progress Notes (Signed)
 09/21/2023  History of Present Illness: Vanessa Farmer is a 88 y.o. female status post robotic assisted cholecystectomy on 05/31/2023 for prior history of acute cholecystitis.  The patient was admitted on 10/11/2022 with acute cholecystitis but given that she was on Eliquis  a percutaneous cholecystostomy drain was placed on 6//24.  Eventually her drain became dislodged in November 2024 and was not replaced at the time.  She underwent robotic cholecystectomy after being cleared by cardiology on 05/31/2023.  However she continues having issues with the prior drain site and has continued to drain.  She presented to the emergency room on 07/06/2023 with likely gastroenteritis symptoms but a CT scan done that day showed a small fluid collection in the gallbladder fossa measuring about 1.9 x 1.3 cm which was possibly communicating with the drain tract itself.  It has been difficult to reach the patient and get her to come to the office as she has had multiple no-show visits in March and April.  The patient denies any foul-smelling odor from the drain site but reports tenderness intermittently in that area.  Her daughter has been trying to keep it dressed.  She has not had any fevers or chills.  She is otherwise eating well and having bowel function.  Past Medical History: Past Medical History:  Diagnosis Date   (HFpEF) heart failure with preserved ejection fraction (HCC)    a.) TTE 12/31/2021: EF 55-60%, no RWMAs, G1DD, mod LAE, mod MAC, norm RVSF, RVSP 19.9, mild MR.   Acute cholecystitis 10/11/2022   Anemia    Anginal pain (HCC)    Aortic atherosclerosis (HCC)    Asthma    Atrial fibrillation with RVR (HCC)    a.) CHA2DS2-VASc = 8 (age x2, sex, HFpEF, HTN, DVT x2, vascular disease history) as of 05/30/2023; b.) cardiac rate/rhythm maintained on oral diltiazem ; chronically anticoagulated using dose reduced apixaban    CAD (coronary artery disease)    CKD stage 3b, GFR 30-44 ml/min (HCC)    COPD (chronic  obstructive pulmonary disease) (HCC)    Coronary artery disease    Depression    Fall    GERD (gastroesophageal reflux disease)    Hiatal hernia    History of bilateral cataract extraction 2021   History of DVT of lower extremity    Hyperlipidemia    Hypertension    Myocardial infarction [facility])    a.) approx 2003; reported to be "mild"; details unclear   On apixaban  therapy    Osteopenia    Polyp of sigmoid colon    Protein-calorie malnutrition, moderate (HCC)    QT prolongation    Scoliosis      Past Surgical History: Past Surgical History:  Procedure Laterality Date   ABDOMINAL HYSTERECTOMY     pt denies 05/13/23   CATARACT EXTRACTION, BILATERAL  2021   COLONOSCOPY WITH PROPOFOL  N/A 12/05/2017   Procedure: COLONOSCOPY WITH PROPOFOL ;  Surgeon: Marnee Sink, MD;  Location: [facility];  Service: Endoscopy;  Laterality: N/A;   ESOPHAGOGASTRODUODENOSCOPY (EGD) WITH PROPOFOL  N/A 12/05/2017   Procedure: ESOPHAGOGASTRODUODENOSCOPY (EGD) WITH PROPOFOL ;  Surgeon: Marnee Sink, MD;  Location: [facility];  Service: Endoscopy;  Laterality: N/A;   IR CHOLANGIOGRAM EXISTING TUBE  12/31/2022   IR EXCHANGE BILIARY DRAIN  12/09/2022   IR EXCHANGE BILIARY DRAIN  01/21/2023   IR PERC CHOLECYSTOSTOMY  10/12/2022   IR RADIOLOGIST EVAL & MGMT  12/09/2022   POLYPECTOMY  12/05/2017   Procedure: POLYPECTOMY;  Surgeon: Marnee Sink, MD;  Location: [facility];  Service: Endoscopy;;   TONSILLECTOMY     TUBAL LIGATION      Home Medications: Prior to Admission medications   Medication Sig Start Date End Date Taking? Authorizing Provider  acetaminophen  (TYLENOL ) 500 MG tablet Take 2 tablets (1,000 mg total) by mouth every 6 (six) hours as needed for mild pain (pain score 1-3). 05/31/23  Yes Tamyrah Burbage, Volanda Gruber, MD  apixaban  (ELIQUIS ) 2.5 MG TABS tablet TAKE 1 TABLET(2.5 MG) BY MOUTH TWICE DAILY 06/23/23  Yes Sheron Dixons, MD  atorvastatin  (LIPITOR) 40 MG tablet TAKE 1 TABLET(40  MG) BY MOUTH DAILY 06/23/23  Yes Sheron Dixons, MD  diltiazem  (CARDIZEM  CD) 240 MG 24 hr capsule TAKE 1 CAPSULE(240 MG) BY MOUTH DAILY 06/23/23  Yes Sheron Dixons, MD  furosemide  (LASIX ) 20 MG tablet TAKE 1 TABLET(20 MG) BY MOUTH DAILY 06/23/23  Yes Sheron Dixons, MD  lisinopril  (ZESTRIL ) 20 MG tablet Take 1 tablet (20 mg total) by mouth daily. Patient taking differently: Take 20 mg by mouth every morning. 04/05/23  Yes Sheron Dixons, MD  Multiple Vitamins-Calcium  (ONE-A-DAY WOMENS PO) Take 1 tablet by mouth 2 (two) times daily.   Yes [provider]  oxyCODONE  (OXY IR/ROXICODONE ) 5 MG immediate release tablet Take 1 tablet (5 mg total) by mouth every 6 (six) hours as needed for severe pain (pain score 7-10). 05/31/23  Yes Dontell Mian, MD  sertraline  (ZOLOFT ) 50 MG tablet TAKE 1 TABLET(50 MG) BY MOUTH DAILY 06/23/23  Yes Sheron Dixons, MD  ENSURE (ENSURE) Take 237 mLs by mouth 2 (two) times daily between meals. Patient taking differently: Take 237 mLs by mouth 3 (three) times daily between meals. 10/20/17 05/27/23  Sheron Dixons, MD    Allergies: No Known Allergies  Review of Systems: Review of Systems  Constitutional:  Negative for chills and fever.  Respiratory:  Negative for shortness of breath.   Cardiovascular:  Negative for chest pain.  Gastrointestinal:  Positive for abdominal pain (at drain site). Negative for nausea and vomiting.    Physical Exam BP (!) 163/72   Pulse 80   Temp 97.8 F (36.6 C) (Oral)   Ht 5' (1.524 m)   Wt 117 lb 6.4 oz (53.3 kg)   SpO2 93%   BMI 22.93 kg/m  CONSTITUTIONAL: No acute distress, well-nourished HEENT:  Normocephalic, atraumatic, extraocular motion intact. RESPIRATORY:  Normal respiratory effort without pathologic use of accessory muscles. CARDIOVASCULAR: Regular rhythm and rate. GI: The abdomen is soft, nondistended, with localized tenderness at the patient's prior drain site.  That area has surrounding skin  moisture changes likely from the amount of drainage fluid.  The skin also follows inwards where the drain used to go through and there is a small amount of subcutaneous tissue that is poking through the skin.  When probing with a Q-tip, the tract itself is very small and I am unable to probe it too deeply.  Silver  nitrate was applied to the area and a dry gauze dressing applied.Aaron Aas  NEUROLOGIC:  Motor and sensation is grossly normal.  Cranial nerves are grossly intact. PSYCH:  Alert and oriented to person, place and time. Affect is normal.  Labs/Imaging: CT abdomen/pelvis on 07/06/2023: IMPRESSION: 1. Small fluid collection the gallbladder fossa, likely communicating with a prior cholecystostomy drain tract as above. Findings are consistent with postoperative hematoma, developing abscess, or biloma in this patient with a reported history of recent cholecystectomy. 2. Stable intrahepatic and extrahepatic biliary duct dilation. 3. Large hiatal hernia containing the  stomach and a long segment of the mid transverse colon. 4.  Aortic Atherosclerosis (ICD10-I70.0).  Assessment and Plan: This is a 88 y.o. female status post robotic cholecystectomy, with a persistent draining wound from prior drain site.  - Discussed with patient that the area where the drain was located before continues to remain open and is draining serous fluid.  There is no purulence and no evidence of infection at this point.  Potentially she may have developed a more thorough track given that the drain was in place for so long before it became dislodged.  After surgery, the wound continues to drain.  Cannot assess fully how deep this wound may be as Q-tip probe does not go too deeply.  Silver  nitrate was applied with efforts to try to create inflammatory response and scar tissue to form and seal this wound.  Dry gauze dressing applied and instructed the patient on daily dressing changes. - Patient will follow-up with me in 2 weeks to  reassess the wound.  If it continues to persist, discussed with patient that we may have to schedule her for office procedure to explore the wound little bit more deeply. - Follow-up in 2 weeks.  I spent 20 minutes dedicated to the care of this patient on the date of this encounter to include pre-visit review of records, face-to-face time with the patient discussing diagnosis and management, and any post-visit coordination of care.   Marene Shape, MD [facility]

## 2023-09-26 ENCOUNTER — Other Ambulatory Visit: Payer: Self-pay | Admitting: Internal Medicine

## 2023-09-26 DIAGNOSIS — I10 Essential (primary) hypertension: Secondary | ICD-10-CM

## 2023-09-26 DIAGNOSIS — F321 Major depressive disorder, single episode, moderate: Secondary | ICD-10-CM

## 2023-09-26 DIAGNOSIS — I482 Chronic atrial fibrillation, unspecified: Secondary | ICD-10-CM

## 2023-09-26 DIAGNOSIS — E785 Hyperlipidemia, unspecified: Secondary | ICD-10-CM

## 2023-09-28 NOTE — Telephone Encounter (Signed)
 Requested Prescriptions  Pending Prescriptions Disp Refills   diltiazem  (CARDIZEM  CD) 240 MG 24 hr capsule [Pharmacy Med Name: DILTIAZEM  CD 240MG  CAPSULES (24 HR)] 90 capsule 0    Sig: TAKE 1 CAPSULE(240 MG) BY MOUTH DAILY     Cardiovascular: Calcium  Channel Blockers 3 Failed - 09/28/2023  9:39 AM      Failed - ALT in normal range and within 360 days    ALT  Date Value Ref Range Status  07/06/2023 217 (H) 0 - 44 U/L Final         Failed - AST in normal range and within 360 days    AST  Date Value Ref Range Status  07/06/2023 337 (H) 15 - 41 U/L Final         Failed - Cr in normal range and within 360 days    Creatinine, Ser  Date Value Ref Range Status  07/06/2023 1.20 (H) 0.44 - 1.00 mg/dL Final         Failed - Last BP in normal range    BP Readings from Last 1 Encounters:  09/21/23 (!) 163/72         Failed - Valid encounter within last 6 months    Recent Outpatient Visits   None     Future Appointments             In 4 weeks [name], MD [facility]            Passed - Last Heart Rate in normal range    Pulse Readings from Last 1 Encounters:  09/21/23 80          ELIQUIS  2.5 MG TABS tablet [Pharmacy Med Name: ELIQUIS  2.5MG  TABLETS] 180 tablet 0    Sig: TAKE 1 TABLET(2.5 MG) BY MOUTH TWICE DAILY     Hematology:  Anticoagulants - apixaban  Failed - 09/28/2023  9:39 AM      Failed - Cr in normal range and within 360 days    Creatinine, Ser  Date Value Ref Range Status  07/06/2023 1.20 (H) 0.44 - 1.00 mg/dL Final         Failed - AST in normal range and within 360 days    AST  Date Value Ref Range Status  07/06/2023 337 (H) 15 - 41 U/L Final         Failed - ALT in normal range and within 360 days    ALT  Date Value Ref Range Status  07/06/2023 217 (H) 0 - 44 U/L Final         Failed - Valid encounter within last 12 months    Recent Outpatient Visits   None     Future Appointments             In 4 weeks  [name], MD [place] [facility]            Passed - PLT in normal range and within 360 days    Platelets  Date Value Ref Range Status  07/06/2023 191 150 - 400 K/uL Final  01/19/2022 169 150 - 450 x10E3/uL Final         Passed - HGB in normal range and within 360 days    Hemoglobin  Date Value Ref Range Status  07/06/2023 12.0 12.0 - 15.0 g/dL Final  65/78/4696 29.5 11.1 - 15.9 g/dL Final         Passed - HCT in normal range  and within 360 days    HCT  Date Value Ref Range Status  07/06/2023 36.1 36.0 - 46.0 % Final   Hematocrit  Date Value Ref Range Status  01/19/2022 37.5 34.0 - 46.6 % Final          furosemide  (LASIX ) 20 MG tablet [Pharmacy Med Name: FUROSEMIDE  20MG  TABLETS] 90 tablet 0    Sig: TAKE 1 TABLET(20 MG) BY MOUTH DAILY     Cardiovascular:  Diuretics - Loop Failed - 09/28/2023  9:39 AM      Failed - Cr in normal range and within 180 days    Creatinine, Ser  Date Value Ref Range Status  07/06/2023 1.20 (H) 0.44 - 1.00 mg/dL Final         Failed - Mg Level in normal range and within 180 days    Magnesium   Date Value Ref Range Status  10/13/2022 1.9 1.7 - 2.4 mg/dL Final    Comment:    Performed at [facility], [address] [place], [place] [address]         Failed - Last BP in normal range    BP Readings from Last 1 Encounters:  09/21/23 (!) 163/72         Failed - Valid encounter within last 6 months    Recent Outpatient Visits   None     Future Appointments             In 4 weeks [name], MD [place] [facility]            Passed - K in normal range and within 180 days    Potassium  Date Value Ref Range Status  07/06/2023 3.7 3.5 - 5.1 mmol/L Final         Passed - Ca in normal range and within 180 days    Calcium   Date Value Ref Range Status  07/06/2023 9.0 8.9 - 10.3 mg/dL Final         Passed - Na in normal range and within 180 days    Sodium  Date  Value Ref Range Status  07/06/2023 137 135 - 145 mmol/L Final  01/19/2022 138 134 - 144 mmol/L Final         Passed - Cl in normal range and within 180 days    Chloride  Date Value Ref Range Status  07/06/2023 104 98 - 111 mmol/L Final          sertraline  (ZOLOFT ) 50 MG tablet [Pharmacy Med Name: SERTRALINE  50MG  TABLETS] 90 tablet 0    Sig: TAKE 1 TABLET(50 MG) BY MOUTH DAILY     Psychiatry:  Antidepressants - SSRI - sertraline  Failed - 09/28/2023  9:39 AM      Failed - AST in normal range and within 360 days    AST  Date Value Ref Range Status  07/06/2023 337 (H) 15 - 41 U/L Final         Failed - ALT in normal range and within 360 days    ALT  Date Value Ref Range Status  07/06/2023 217 (H) 0 - 44 U/L Final         Failed - Valid encounter within last 6 months    Recent Outpatient Visits   None     Future Appointments             In 4 weeks Juliette Oh, Stephannie Ehlers, MD [place] [facility]  Passed - Completed PHQ-2 or PHQ-9 in the last 360 days       atorvastatin  (LIPITOR) 40 MG tablet [Pharmacy Med Name: ATORVASTATIN  40MG  TABLETS] 90 tablet 0    Sig: TAKE 1 TABLET(40 MG) BY MOUTH DAILY     Cardiovascular:  Antilipid - Statins Failed - 09/28/2023  9:39 AM      Failed - Valid encounter within last 12 months    Recent Outpatient Visits   None     Future Appointments             In 4 weeks [name], MD  [facility]            Failed - Lipid Panel in normal range within the last 12 months    Cholesterol, Total  Date Value Ref Range Status  10/17/2020 162 100 - 199 mg/dL Final   LDL Chol Calc (NIH)  Date Value Ref Range Status  10/17/2020 80 0 - 99 mg/dL Final   HDL  Date Value Ref Range Status  10/17/2020 71 >39 mg/dL Final   Triglycerides  Date Value Ref Range Status  10/17/2020 52 0 - 149 mg/dL Final         Passed - Patient is not pregnant

## 2023-10-05 ENCOUNTER — Ambulatory Visit (INDEPENDENT_AMBULATORY_CARE_PROVIDER_SITE_OTHER): Admitting: Surgery

## 2023-10-05 ENCOUNTER — Encounter: Payer: Self-pay | Admitting: Surgery

## 2023-10-05 VITALS — BP 177/83 | HR 77 | Temp 98.0°F | Ht 60.0 in | Wt 118.2 lb

## 2023-10-05 DIAGNOSIS — L929 Granulomatous disorder of the skin and subcutaneous tissue, unspecified: Secondary | ICD-10-CM | POA: Diagnosis not present

## 2023-10-05 DIAGNOSIS — K810 Acute cholecystitis: Secondary | ICD-10-CM | POA: Diagnosis not present

## 2023-10-05 DIAGNOSIS — Z09 Encounter for follow-up examination after completed treatment for conditions other than malignant neoplasm: Secondary | ICD-10-CM

## 2023-10-05 DIAGNOSIS — T148XXA Other injury of unspecified body region, initial encounter: Secondary | ICD-10-CM

## 2023-10-05 NOTE — Patient Instructions (Addendum)
 Continue to change dressing daily  Please call the office if you have any questions or concerns

## 2023-10-05 NOTE — Progress Notes (Signed)
 10/05/2023  History of Present Illness: Vanessa Farmer is a 88 y.o. female status post robotic assisted cholecystectomy on 05/31/2023.  The patient has been following recently with still an open wound in the right upper quadrant at the prior site of cholecystostomy drain.  On her last visit on 09/21/2023, the skin wound had some subcutaneous tissue protruding and a small track could be seen from the prior drain.  Silver  nitrate was applied to this area.  Today she reports that the wound has been healing well and is almost healed.  Yesterday the dressing did not have any drainage.  Denies any significant pain in that area.  Past Medical History: Past Medical History:  Diagnosis Date   (HFpEF) heart failure with preserved ejection fraction (HCC)    a.) TTE 12/31/2021: EF 55-60%, no RWMAs, G1DD, mod LAE, mod MAC, norm RVSF, RVSP 19.9, mild MR.   Acute cholecystitis 10/11/2022   Anemia    Anginal pain (HCC)    Aortic atherosclerosis (HCC)    Asthma    Atrial fibrillation with RVR (HCC)    a.) CHA2DS2-VASc = 8 (age x2, sex, HFpEF, HTN, DVT x2, vascular disease history) as of 05/30/2023; b.) cardiac rate/rhythm maintained on oral diltiazem ; chronically anticoagulated using dose reduced apixaban    CAD (coronary artery disease)    CKD stage 3b, GFR 30-44 ml/min (HCC)    COPD (chronic obstructive pulmonary disease) (HCC)    Coronary artery disease    Depression    Fall    GERD (gastroesophageal reflux disease)    Hiatal hernia    History of bilateral cataract extraction 2021   History of DVT of lower extremity    Hyperlipidemia    Hypertension    Myocardial infarction [facility])    a.) approx 2003; reported to be "mild"; details unclear   On apixaban  therapy    Osteopenia    Polyp of sigmoid colon    Protein-calorie malnutrition, moderate (HCC)    QT prolongation    Scoliosis      Past Surgical History: Past Surgical History:  Procedure Laterality Date   ABDOMINAL HYSTERECTOMY     pt  denies 05/13/23   CATARACT EXTRACTION, BILATERAL  2021   COLONOSCOPY WITH PROPOFOL  N/A 12/05/2017   Procedure: COLONOSCOPY WITH PROPOFOL ;  Surgeon: Marnee Sink, MD;  Location: [facility];  Service: Endoscopy;  Laterality: N/A;   ESOPHAGOGASTRODUODENOSCOPY (EGD) WITH PROPOFOL  N/A 12/05/2017   Procedure: ESOPHAGOGASTRODUODENOSCOPY (EGD) WITH PROPOFOL ;  Surgeon: Marnee Sink, MD;  Location: [facility];  Service: Endoscopy;  Laterality: N/A;   IR CHOLANGIOGRAM EXISTING TUBE  12/31/2022   IR EXCHANGE BILIARY DRAIN  12/09/2022   IR EXCHANGE BILIARY DRAIN  01/21/2023   IR PERC CHOLECYSTOSTOMY  10/12/2022   IR RADIOLOGIST EVAL & MGMT  12/09/2022   POLYPECTOMY  12/05/2017   Procedure: POLYPECTOMY;  Surgeon: Marnee Sink, MD;  Location: [facility];  Service: Endoscopy;;   TONSILLECTOMY     TUBAL LIGATION      Home Medications: Prior to Admission medications   Medication Sig Start Date End Date Taking? Authorizing Provider  acetaminophen  (TYLENOL ) 500 MG tablet Take 2 tablets (1,000 mg total) by mouth every 6 (six) hours as needed for mild pain (pain score 1-3). 05/31/23   Emmalene Hare, MD  atorvastatin  (LIPITOR) 40 MG tablet TAKE 1 TABLET(40 MG) BY MOUTH DAILY 09/28/23   Berglund, Laura H, MD  diltiazem  (CARDIZEM  CD) 240 MG 24 hr capsule TAKE 1 CAPSULE(240 MG) BY MOUTH DAILY 09/28/23  Sheron Dixons, MD  ELIQUIS  2.5 MG TABS tablet TAKE 1 TABLET(2.5 MG) BY MOUTH TWICE DAILY 09/28/23   Berglund, Laura H, MD  ENSURE (ENSURE) Take 237 mLs by mouth 2 (two) times daily between meals. Patient taking differently: Take 237 mLs by mouth 3 (three) times daily between meals. 10/20/17 05/27/23  Sheron Dixons, MD  furosemide  (LASIX ) 20 MG tablet TAKE 1 TABLET(20 MG) BY MOUTH DAILY 09/28/23   Sheron Dixons, MD  lisinopril  (ZESTRIL ) 20 MG tablet Take 1 tablet (20 mg total) by mouth daily. Patient taking differently: Take 20 mg by mouth every morning. 04/05/23   Sheron Dixons, MD   Multiple Vitamins-Calcium  (ONE-A-DAY WOMENS PO) Take 1 tablet by mouth 2 (two) times daily.    [provider]  sertraline  (ZOLOFT ) 50 MG tablet TAKE 1 TABLET(50 MG) BY MOUTH DAILY 09/28/23   Sheron Dixons, MD    Allergies: No Known Allergies  Review of Systems: Review of Systems  Constitutional:  Negative for chills and fever.  Respiratory:  Negative for shortness of breath.   Cardiovascular:  Negative for chest pain.  Gastrointestinal:  Negative for nausea and vomiting.  Skin:  Negative for rash.    Physical Exam BP (!) 177/83   Pulse 77   Temp 98 F (36.7 C) (Oral)   Ht 5' (1.524 m)   Wt 118 lb 3.2 oz (53.6 kg)   SpO2 94%   BMI 23.08 kg/m  CONSTITUTIONAL: No acute distress HEENT:  Normocephalic, atraumatic, extraocular motion intact. RESPIRATORY:  Normal respiratory effort without pathologic use of accessory muscles. GI: The abdomen is soft, nondistended, nontender to palpation.  The right upper quadrant wound is healing very appropriately and is very small at this point now measuring about 3 mm in size.  Very minimal drainage coming from it.  Silver  nitrate was applied again to this area and a dry gauze dressing applied. NEUROLOGIC:  Motor and sensation is grossly normal.  Cranial nerves are grossly intact. PSYCH:  Alert and oriented to person, place and time. Affect is normal.  Assessment and Plan: This is a 88 y.o. female status post robotic assisted cholecystectomy with a persistent right upper quadrant wound prior cholecystostomy drain site.  - Discussed with patient that the right upper quadrant wound is healing very well at this point and is almost healed up.  I did reapply silver  nitrate 1 more time to make sure that this continues to heal appropriately.  There is no further skin excoriation from moisture in this area and I think she is doing very well at this point. - Instructed her on continued doing daily dressing changes until the wound finishes  healing. - Given that she is doing so well at this point, she may follow-up as needed.  Return precautions given.  I spent 10 minutes dedicated to the care of this patient on the date of this encounter to include pre-visit review of records, face-to-face time with the patient discussing diagnosis and management, and any post-visit coordination of care.   Marene Shape, MD [facility]

## 2023-10-21 ENCOUNTER — Other Ambulatory Visit: Payer: Self-pay | Admitting: Internal Medicine

## 2023-10-21 DIAGNOSIS — I10 Essential (primary) hypertension: Secondary | ICD-10-CM

## 2023-10-21 NOTE — Telephone Encounter (Signed)
 Requested Prescriptions  Pending Prescriptions Disp Refills   lisinopril  (ZESTRIL ) 20 MG tablet [Pharmacy Med Name: LISINOPRIL  20MG  TABLETS] 90 tablet 0    Sig: Take 1 tablet (20 mg total) by mouth every morning.     Cardiovascular:  ACE Inhibitors Failed - 10/21/2023 12:05 PM      Failed - Cr in normal range and within 180 days    Creatinine, Ser  Date Value Ref Range Status  07/06/2023 1.20 (H) 0.44 - 1.00 mg/dL Final         Failed - Last BP in normal range    BP Readings from Last 1 Encounters:  10/05/23 (!) 177/83         Failed - Valid encounter within last 6 months    Recent Outpatient Visits   None     Future Appointments             In 5 days [name], MD [facility]            Passed - K in normal range and within 180 days    Potassium  Date Value Ref Range Status  07/06/2023 3.7 3.5 - 5.1 mmol/L Final         Passed - Patient is not pregnant

## 2023-10-26 ENCOUNTER — Ambulatory Visit: Admitting: Pediatrics

## 2023-12-25 ENCOUNTER — Other Ambulatory Visit: Payer: Self-pay | Admitting: Internal Medicine

## 2023-12-25 DIAGNOSIS — I10 Essential (primary) hypertension: Secondary | ICD-10-CM

## 2023-12-25 DIAGNOSIS — I482 Chronic atrial fibrillation, unspecified: Secondary | ICD-10-CM

## 2023-12-25 DIAGNOSIS — F321 Major depressive disorder, single episode, moderate: Secondary | ICD-10-CM

## 2023-12-25 DIAGNOSIS — E785 Hyperlipidemia, unspecified: Secondary | ICD-10-CM

## 2023-12-27 NOTE — Telephone Encounter (Signed)
 Requested Prescriptions  Pending Prescriptions Disp Refills   diltiazem  (CARDIZEM  CD) 240 MG 24 hr capsule [Pharmacy Med Name: DILTIAZEM  CD 240MG  CAPSULES (24 HR)] 90 capsule 0    Sig: TAKE 1 CAPSULE(240 MG) BY MOUTH DAILY     Cardiovascular: Calcium  Channel Blockers 3 Failed - 12/27/2023  3:56 PM      Failed - ALT in normal range and within 360 days    ALT  Date Value Ref Range Status  07/06/2023 217 (H) 0 - 44 U/L Final         Failed - AST in normal range and within 360 days    AST  Date Value Ref Range Status  07/06/2023 337 (H) 15 - 41 U/L Final         Failed - Cr in normal range and within 360 days    Creatinine, Ser  Date Value Ref Range Status  07/06/2023 1.20 (H) 0.44 - 1.00 mg/dL Final         Failed - Last BP in normal range    BP Readings from Last 1 Encounters:  10/05/23 (!) 177/83         Failed - Valid encounter within last 6 months    Recent Outpatient Visits   None            Passed - Last Heart Rate in normal range    Pulse Readings from Last 1 Encounters:  10/05/23 77          ELIQUIS  2.5 MG TABS tablet [Pharmacy Med Name: ELIQUIS  2.5MG  TABLETS] 180 tablet 0    Sig: TAKE 1 TABLET(2.5 MG) BY MOUTH TWICE DAILY     Hematology:  Anticoagulants - apixaban  Failed - 12/27/2023  3:56 PM      Failed - Cr in normal range and within 360 days    Creatinine, Ser  Date Value Ref Range Status  07/06/2023 1.20 (H) 0.44 - 1.00 mg/dL Final         Failed - AST in normal range and within 360 days    AST  Date Value Ref Range Status  07/06/2023 337 (H) 15 - 41 U/L Final         Failed - ALT in normal range and within 360 days    ALT  Date Value Ref Range Status  07/06/2023 217 (H) 0 - 44 U/L Final         Failed - Valid encounter within last 12 months    Recent Outpatient Visits   None            Passed - PLT in normal range and within 360 days    Platelets  Date Value Ref Range Status  07/06/2023 191 150 - 400 K/uL Final  01/19/2022 169  150 - 450 x10E3/uL Final         Passed - HGB in normal range and within 360 days    Hemoglobin  Date Value Ref Range Status  07/06/2023 12.0 12.0 - 15.0 g/dL Final  90/87/7976 87.3 11.1 - 15.9 g/dL Final         Passed - HCT in normal range and within 360 days    HCT  Date Value Ref Range Status  07/06/2023 36.1 36.0 - 46.0 % Final   Hematocrit  Date Value Ref Range Status  01/19/2022 37.5 34.0 - 46.6 % Final          furosemide  (LASIX ) 20 MG tablet [Pharmacy Med Name: FUROSEMIDE  20MG  TABLETS] 90 tablet 0  Sig: TAKE 1 TABLET(20 MG) BY MOUTH DAILY     Cardiovascular:  Diuretics - Loop Failed - 12/27/2023  3:56 PM      Failed - Cr in normal range and within 180 days    Creatinine, Ser  Date Value Ref Range Status  07/06/2023 1.20 (H) 0.44 - 1.00 mg/dL Final         Failed - Mg Level in normal range and within 180 days    Magnesium   Date Value Ref Range Status  10/13/2022 1.9 1.7 - 2.4 mg/dL Final    Comment:    Performed at [facility], [address] [place], [place] [address]         Failed - Last BP in normal range    BP Readings from Last 1 Encounters:  10/05/23 (!) 177/83         Failed - Valid encounter within last 6 months    Recent Outpatient Visits   None            Passed - K in normal range and within 180 days    Potassium  Date Value Ref Range Status  07/06/2023 3.7 3.5 - 5.1 mmol/L Final         Passed - Ca in normal range and within 180 days    Calcium   Date Value Ref Range Status  07/06/2023 9.0 8.9 - 10.3 mg/dL Final         Passed - Na in normal range and within 180 days    Sodium  Date Value Ref Range Status  07/06/2023 137 135 - 145 mmol/L Final  01/19/2022 138 134 - 144 mmol/L Final         Passed - Cl in normal range and within 180 days    Chloride  Date Value Ref Range Status  07/06/2023 104 98 - 111 mmol/L Final          sertraline  (ZOLOFT ) 50 MG tablet [Pharmacy Med Name: SERTRALINE  50MG  TABLETS] 90  tablet 0    Sig: TAKE 1 TABLET(50 MG) BY MOUTH DAILY     Psychiatry:  Antidepressants - SSRI - sertraline  Failed - 12/27/2023  3:56 PM      Failed - AST in normal range and within 360 days    AST  Date Value Ref Range Status  07/06/2023 337 (H) 15 - 41 U/L Final         Failed - ALT in normal range and within 360 days    ALT  Date Value Ref Range Status  07/06/2023 217 (H) 0 - 44 U/L Final         Failed - Valid encounter within last 6 months    Recent Outpatient Visits   None            Passed - Completed PHQ-2 or PHQ-9 in the last 360 days       atorvastatin  (LIPITOR) 40 MG tablet [Pharmacy Med Name: ATORVASTATIN  40MG  TABLETS] 90 tablet 0    Sig: TAKE 1 TABLET(40 MG) BY MOUTH DAILY     Cardiovascular:  Antilipid - Statins Failed - 12/27/2023  3:56 PM      Failed - Valid encounter within last 12 months    Recent Outpatient Visits   None            Failed - Lipid Panel in normal range within the last 12 months    Cholesterol, Total  Date Value Ref Range Status  10/17/2020 162 100 - 199  mg/dL Final   LDL Chol Calc (NIH)  Date Value Ref Range Status  10/17/2020 80 0 - 99 mg/dL Final   HDL  Date Value Ref Range Status  10/17/2020 71 >39 mg/dL Final   Triglycerides  Date Value Ref Range Status  10/17/2020 52 0 - 149 mg/dL Final         Passed - Patient is not pregnant

## 2023-12-29 ENCOUNTER — Other Ambulatory Visit: Payer: Self-pay | Admitting: Internal Medicine

## 2023-12-29 DIAGNOSIS — I10 Essential (primary) hypertension: Secondary | ICD-10-CM

## 2023-12-30 NOTE — Telephone Encounter (Signed)
 Requested medications are due for refill today.  yes  Requested medications are on the active medications list.  yes  Last refill. 10/21/2023 #90 0 rf  Future visit scheduled.   Yes - both at [place] and [facility]  Notes to clinic.  Pt last seen 04/05/2023. Pt is 3 months over due for OV.    Requested Prescriptions  Pending Prescriptions Disp Refills   lisinopril  (ZESTRIL ) 20 MG tablet [Pharmacy Med Name: LISINOPRIL  20MG  TABLETS] 90 tablet 0    Sig: TAKE 1 TABLET(20 MG) BY MOUTH EVERY MORNING     Cardiovascular:  ACE Inhibitors Failed - 12/30/2023  3:04 PM      Failed - Cr in normal range and within 180 days    Creatinine, Ser  Date Value Ref Range Status  07/06/2023 1.20 (H) 0.44 - 1.00 mg/dL Final         Failed - Last BP in normal range    BP Readings from Last 1 Encounters:  10/05/23 (!) 177/83         Failed - Valid encounter within last 6 months    Recent Outpatient Visits   None            Passed - K in normal range and within 180 days    Potassium  Date Value Ref Range Status  07/06/2023 3.7 3.5 - 5.1 mmol/L Final         Passed - Patient is not pregnant

## 2024-01-31 ENCOUNTER — Other Ambulatory Visit: Payer: Self-pay

## 2024-01-31 ENCOUNTER — Emergency Department

## 2024-01-31 ENCOUNTER — Emergency Department
Admission: EM | Admit: 2024-01-31 | Discharge: 2024-02-01 | Disposition: A | Discharge status: Home / Self Care | Attending: Emergency Medicine | Admitting: Emergency Medicine

## 2024-01-31 DIAGNOSIS — N8500 Endometrial hyperplasia, unspecified: Secondary | ICD-10-CM | POA: Diagnosis not present

## 2024-01-31 DIAGNOSIS — I5030 Unspecified diastolic (congestive) heart failure: Secondary | ICD-10-CM | POA: Insufficient documentation

## 2024-01-31 DIAGNOSIS — R9389 Abnormal findings on diagnostic imaging of other specified body structures: Secondary | ICD-10-CM

## 2024-01-31 DIAGNOSIS — R0602 Shortness of breath: Secondary | ICD-10-CM | POA: Diagnosis not present

## 2024-01-31 DIAGNOSIS — Z7901 Long term (current) use of anticoagulants: Secondary | ICD-10-CM | POA: Insufficient documentation

## 2024-01-31 DIAGNOSIS — N189 Chronic kidney disease, unspecified: Secondary | ICD-10-CM | POA: Insufficient documentation

## 2024-01-31 DIAGNOSIS — I2510 Atherosclerotic heart disease of native coronary artery without angina pectoris: Secondary | ICD-10-CM | POA: Diagnosis not present

## 2024-01-31 DIAGNOSIS — N939 Abnormal uterine and vaginal bleeding, unspecified: Secondary | ICD-10-CM | POA: Diagnosis not present

## 2024-01-31 DIAGNOSIS — R0609 Other forms of dyspnea: Secondary | ICD-10-CM

## 2024-01-31 DIAGNOSIS — I4891 Unspecified atrial fibrillation: Secondary | ICD-10-CM | POA: Insufficient documentation

## 2024-01-31 LAB — BASIC METABOLIC PANEL WITH GFR
Anion gap: 13 (ref 5–15)
BUN: 22 mg/dL (ref 8–23)
CO2: 24 mmol/L (ref 22–32)
Calcium: 9 mg/dL (ref 8.9–10.3)
Chloride: 99 mmol/L (ref 98–111)
Creatinine, Ser: 1.2 mg/dL — ABNORMAL HIGH (ref 0.44–1.00)
GFR, Estimated: 44 mL/min — ABNORMAL LOW (ref >60)
Glucose, Bld: 124 mg/dL — ABNORMAL HIGH (ref 70–99)
Potassium: 4.1 mmol/L (ref 3.5–5.1)
Sodium: 136 mmol/L (ref 135–145)

## 2024-01-31 LAB — CBC
HCT: 36.6 % (ref 36.0–46.0)
Hemoglobin: 12.2 g/dL (ref 12.0–15.0)
MCH: 36.3 pg — ABNORMAL HIGH (ref 26.0–34.0)
MCHC: 33.3 g/dL (ref 30.0–36.0)
MCV: 108.9 fL — ABNORMAL HIGH (ref 80.0–100.0)
Platelets: 149 K/uL — ABNORMAL LOW (ref 150–400)
RBC: 3.36 MIL/uL — ABNORMAL LOW (ref 3.87–5.11)
RDW: 13 % (ref 11.5–15.5)
WBC: 2.6 K/uL — ABNORMAL LOW (ref 4.0–10.5)
nRBC: 0 % (ref 0.0–0.2)

## 2024-01-31 LAB — TROPONIN I (HIGH SENSITIVITY): Troponin I (High Sensitivity): 11 ng/L (ref <18)

## 2024-01-31 LAB — BRAIN NATRIURETIC PEPTIDE: B Natriuretic Peptide: 97 pg/mL (ref 0.0–100.0)

## 2024-01-31 MED ORDER — FUROSEMIDE 10 MG/ML IJ SOLN
40.0000 mg | Freq: Once | INTRAMUSCULAR | Status: AC
  Administered 2024-02-01: 40 mg via INTRAVENOUS
  Filled 2024-01-31: qty 4

## 2024-01-31 NOTE — ED Provider Notes (Signed)
 [facility] Provider Note    Event Date/Time   First MD Initiated Contact with Patient 01/31/24 2259     (approximate)   History   Chief Complaint Vaginal Bleeding and Shortness of Breath   HPI  Vanessa Farmer is a 88 y.o. female with past medical history of hyperlipidemia, CAD, HFpEF, atrial fibrillation on Eliquis , CKD, GERD, and iron deficiency anemia who presents to the ED complaining of vaginal bleeding.  Patient reports that she has had about 2 days of persistent vaginal bleeding, denies any associated abdominal pain.  She does report some pain in her back, but states this has been ongoing since her gallbladder surgery about 1 year ago.  She reports having to change a pad twice over the past 12 hours, has not had any dysuria or hematuria.  She denies any history of similar bleeding, does report some increasing dyspnea on exertion over the past few weeks.  She has not had any pain in her chest and denies any fevers or cough.  She denies any shortness of breath currently while at rest.     Physical Exam   Triage Vital Signs: ED Triage Vitals [01/31/24 2232]  Encounter Vitals Group     BP (!) 188/93     Girls Systolic BP Percentile      Girls Diastolic BP Percentile      Boys Systolic BP Percentile      Boys Diastolic BP Percentile      Pulse Rate 80     Resp 18     Temp 98.2 F (36.8 C)     Temp Source Oral     SpO2 96 %     Weight      Height      Head Circumference      Peak Flow      Pain Score      Pain Loc      Pain Education      Exclude from Growth Chart     Most recent vital signs: Vitals:   02/01/24 0045 02/01/24 0059  BP:  (!) 163/69  Pulse: 69   Resp: 14   Temp:    SpO2: 97%     Constitutional: Alert and oriented. Eyes: Conjunctivae are normal. Head: Atraumatic. Nose: No congestion/rhinnorhea. Mouth/Throat: Mucous membranes are moist.  Cardiovascular: Normal rate, regular rhythm. Grossly normal heart sounds.  2+  radial pulses bilaterally. Respiratory: Normal respiratory effort.  No retractions. Lungs CTAB. Gastrointestinal: Soft and nontender. No distention. Genitourinary: Pelvic exam with small amount of dried blood, no active bleeding noted and appearance of cervix is unremarkable. Musculoskeletal: No lower extremity tenderness, 1+ pitting edema to mid shins bilaterally. Neurologic:  Normal speech and language. No gross focal neurologic deficits are appreciated.    ED Results / Procedures / Treatments   Labs (all labs ordered are listed, but only abnormal results are displayed) Labs Reviewed  BASIC METABOLIC PANEL WITH GFR - Abnormal; Notable for the following components:      Result Value   Glucose, Bld 124 (*)    Creatinine, Ser 1.20 (*)    GFR, Estimated 44 (*)    All other components within normal limits  CBC - Abnormal; Notable for the following components:   WBC 2.6 (*)    RBC 3.36 (*)    MCV 108.9 (*)    MCH 36.3 (*)    Platelets 149 (*)    All other components within normal limits  BRAIN NATRIURETIC PEPTIDE  TROPONIN I (HIGH SENSITIVITY)     EKG  ED ECG REPORT I, Carlin Palin, the attending physician, personally viewed and interpreted this ECG.   Date: 01/31/2024  EKG Time: 22:40  Rate: 82  Rhythm: normal sinus rhythm, occasional PVC noted, unifocal  Axis: LAD  Intervals:left bundle branch block  ST&T Change: None  RADIOLOGY Chest x-ray reviewed and interpreted by me with no infiltrate, edema, or effusion.  PROCEDURES:  Critical Care performed: No  Procedures   MEDICATIONS ORDERED IN ED: Medications  furosemide  (LASIX ) injection 40 mg (40 mg Intravenous Given 02/01/24 0049)  morphine  (PF) 2 MG/ML injection 2 mg (2 mg Intravenous Given 02/01/24 0049)     IMPRESSION / MDM / ASSESSMENT AND PLAN / ED COURSE  I reviewed the triage vital signs and the nursing notes.                              88 y.o. female with past medical history of hyperlipidemia,  CAD, HFpEF, atrial fibrillation on Eliquis , CKD, GERD, and iron deficiency who presents to the ED complaining of 2 days of vaginal bleeding with a few weeks of increasing dyspnea on exertion.  Patient's presentation is most consistent with acute presentation with potential threat to life or bodily function.  Differential diagnosis includes, but is not limited to, malignancy, uterine fibroids, anemia, UTI, electrolyte abnormality, AKI, CHF exacerbation, COPD, pneumonia, ACS, PE.  Patient nontoxic-appearing and in no acute distress, vital signs are unremarkable.  She is not in any respiratory distress and maintaining oxygen saturations at 96% on room air.  EKG shows no evidence of arrhythmia or ischemia and chest x-ray unremarkable, troponin pending but BNP within normal limits.  She does have some edema in her legs and may have a mild CHF exacerbation, will give IV Lasix .  Hemoglobin stable compared to previous with no significant leukocytosis, electrolyte abnormality, or AKI.  Pelvic exam shows with small amount of dried blood but no active bleeding, will further assess with pelvic ultrasound.  Pelvic ultrasound with thickened endometrium, patient will need outpatient biopsy to further assess this.  She continues to breathe comfortably on room air and is appropriate for outpatient management, referral provided to OB/GYN and she was counseled to follow-up with her PCP regarding acute on chronic dyspnea.  She was counseled to return to the ED for new or worsening symptoms, patient and family agrees with plan.      FINAL CLINICAL IMPRESSION(S) / ED DIAGNOSES   Final diagnoses:  Vaginal bleeding  Endometrial thickening on ultrasound  Dyspnea on exertion     Rx / DC Orders   ED Discharge Orders     None        Note:  This document was prepared using Dragon voice recognition software and may include unintentional dictation errors.   Palin Carlin, MD 02/01/24 [phone]

## 2024-01-31 NOTE — ED Notes (Signed)
 Blue, green, and lav top tubes sent to lab

## 2024-01-31 NOTE — ED Triage Notes (Addendum)
 Pt arrives POV c/o of vaginal bleeding x 2 days. Pt reports chronic lower back pain x 1 year. Pt reports using 2-3 pads today. Increased SOB over the past couple of weeks per granddaughter, which is who she has been residing w/ since July. Pt on eliquis .

## 2024-02-01 ENCOUNTER — Emergency Department

## 2024-02-01 ENCOUNTER — Ambulatory Visit (INDEPENDENT_AMBULATORY_CARE_PROVIDER_SITE_OTHER): Admitting: Internal Medicine

## 2024-02-01 ENCOUNTER — Encounter: Payer: Self-pay | Admitting: Internal Medicine

## 2024-02-01 VITALS — BP 142/68 | HR 112 | Ht 60.0 in | Wt 119.0 lb

## 2024-02-01 DIAGNOSIS — N8500 Endometrial hyperplasia, unspecified: Secondary | ICD-10-CM | POA: Diagnosis not present

## 2024-02-01 DIAGNOSIS — I10 Essential (primary) hypertension: Secondary | ICD-10-CM

## 2024-02-01 DIAGNOSIS — I4891 Unspecified atrial fibrillation: Secondary | ICD-10-CM

## 2024-02-01 DIAGNOSIS — F321 Major depressive disorder, single episode, moderate: Secondary | ICD-10-CM

## 2024-02-01 DIAGNOSIS — N1832 Chronic kidney disease, stage 3b: Secondary | ICD-10-CM

## 2024-02-01 DIAGNOSIS — N939 Abnormal uterine and vaginal bleeding, unspecified: Secondary | ICD-10-CM

## 2024-02-01 MED ORDER — LISINOPRIL 20 MG PO TABS: 20.0000 mg | ORAL_TABLET | ORAL | 1 refills | Status: DC

## 2024-02-01 MED ORDER — MORPHINE SULFATE (PF) 2 MG/ML IV SOLN
2.0000 mg | Freq: Once | INTRAVENOUS | Status: AC
  Administered 2024-02-01: 2 mg via INTRAVENOUS
  Filled 2024-02-01: qty 1

## 2024-02-01 NOTE — Assessment & Plan Note (Signed)
 Stable GFR; diuresed in the ED yesterday.

## 2024-02-01 NOTE — Assessment & Plan Note (Signed)
 Clinically stable on Sertraline.   No SI or HI on evaluation. Plan to continue same medications for now.

## 2024-02-01 NOTE — Progress Notes (Signed)
 Date:  02/01/2024   Name:  Vanessa Farmer   DOB:  February 21, 1936   MRN:  [id number]   Chief Complaint: Shortness of Breath (Follow up from ER. Patient also had vaginal bleeding and was told to see GYN. )  Shortness of Breath This is a chronic problem. The problem occurs daily. The problem has been unchanged. Associated symptoms include leg swelling. Pertinent negatives include no abdominal pain, chest pain, fever, headaches or wheezing. Exacerbated by: due to kyphosis and large intra thoracic hiatal hernia.  Vaginal Bleeding The patient's pertinent negatives include no pelvic pain. Pertinent negatives include no abdominal pain, chills, fever or headaches.    Review of Systems  Constitutional:  Positive for fatigue. Negative for chills and fever.  Respiratory:  Positive for shortness of breath. Negative for chest tightness and wheezing.   Cardiovascular:  Positive for leg swelling. Negative for chest pain.  Gastrointestinal:  Negative for abdominal pain.  Genitourinary:  Positive for vaginal bleeding. Negative for genital sores and pelvic pain.  Neurological:  Negative for dizziness and headaches.  Psychiatric/Behavioral:  Negative for dysphoric mood and sleep disturbance. The patient is not nervous/anxious.      Lab Results  Component Value Date   NA 136 01/31/2024   K 4.1 01/31/2024   CO2 24 01/31/2024   GLUCOSE 124 (H) 01/31/2024   BUN 22 01/31/2024   CREATININE 1.20 (H) 01/31/2024   CALCIUM  9.0 01/31/2024   EGFR 48 (L) 01/19/2022   GFRNONAA 44 (L) 01/31/2024   Lab Results  Component Value Date   CHOL 162 10/17/2020   HDL 71 10/17/2020   LDLCALC 80 10/17/2020   TRIG 52 10/17/2020   CHOLHDL 2.3 10/17/2020   Lab Results  Component Value Date   TSH 1.377 04/04/2022   No results found for: HGBA1C Lab Results  Component Value Date   WBC 2.6 (L) 01/31/2024   HGB 12.2 01/31/2024   HCT 36.6 01/31/2024   MCV 108.9 (H) 01/31/2024   PLT 149 (L) 01/31/2024   Lab Results   Component Value Date   ALT 217 (H) 07/06/2023   AST 337 (H) 07/06/2023   ALKPHOS 132 (H) 07/06/2023   BILITOT 1.5 (H) 07/06/2023   Lab Results  Component Value Date   VD25OH 43.1 06/10/2017     Patient Active Problem List   Diagnosis Date Noted   Stage 3b chronic kidney disease (HCC) 12/17/2022   Protein-calorie malnutrition, moderate 10/11/2022   QT prolongation 10/11/2022   History of DVT of lower extremity 04/05/2022   (HFpEF) heart failure with preserved ejection fraction (HCC) 04/05/2022   Atrial fibrillation with RVR (HCC) 04/04/2022   Ganglion cyst of wrist, right 03/20/2019   Iron deficiency anemia    Polyp of sigmoid colon    GERD (gastroesophageal reflux disease) 06/10/2017   Hyperlipidemia 06/08/2016   Scoliosis 06/07/2016   Osteoporosis 06/07/2016   Essential hypertension 06/07/2016   CAD (coronary artery disease) 06/07/2016   Depression, major, single episode, moderate (HCC) 06/07/2016    No Known Allergies  Past Surgical History:  Procedure Laterality Date   ABDOMINAL HYSTERECTOMY     pt denies 05/13/23   CATARACT EXTRACTION, BILATERAL  2021   COLONOSCOPY WITH PROPOFOL  N/A 12/05/2017   Procedure: COLONOSCOPY WITH PROPOFOL ;  Surgeon: Jinny Carmine, MD;  Location: [facility];  Service: Endoscopy;  Laterality: N/A;   ESOPHAGOGASTRODUODENOSCOPY (EGD) WITH PROPOFOL  N/A 12/05/2017   Procedure: ESOPHAGOGASTRODUODENOSCOPY (EGD) WITH PROPOFOL ;  Surgeon: Jinny Carmine, MD;  Location: [facility];  Service: Endoscopy;  Laterality: N/A;   IR CHOLANGIOGRAM EXISTING TUBE  12/31/2022   IR EXCHANGE BILIARY DRAIN  12/09/2022   IR EXCHANGE BILIARY DRAIN  01/21/2023   IR PERC CHOLECYSTOSTOMY  10/12/2022   IR RADIOLOGIST EVAL & MGMT  12/09/2022   POLYPECTOMY  12/05/2017   Procedure: POLYPECTOMY;  Surgeon: Jinny Carmine, MD;  Location: [facility];  Service: Endoscopy;;   TONSILLECTOMY     TUBAL LIGATION      Social History   Tobacco Use    Smoking status: Never    Passive exposure: Never   Smokeless tobacco: Never   Tobacco comments:    smoking cessation materials not required  Vaping Use   Vaping status: Never Used  Substance Use Topics   Alcohol use: No   Drug use: No     Medication list has been reviewed and updated.  Current Meds  Medication Sig   acetaminophen  (TYLENOL ) 500 MG tablet Take 2 tablets (1,000 mg total) by mouth every 6 (six) hours as needed for mild pain (pain score 1-3).   atorvastatin  (LIPITOR) 40 MG tablet TAKE 1 TABLET(40 MG) BY MOUTH DAILY   diltiazem  (CARDIZEM  CD) 240 MG 24 hr capsule TAKE 1 CAPSULE(240 MG) BY MOUTH DAILY   ELIQUIS  2.5 MG TABS tablet TAKE 1 TABLET(2.5 MG) BY MOUTH TWICE DAILY   ENSURE (ENSURE) Take 237 mLs by mouth 2 (two) times daily between meals. (Patient taking differently: Take 237 mLs by mouth 3 (three) times daily between meals.)   furosemide  (LASIX ) 20 MG tablet TAKE 1 TABLET(20 MG) BY MOUTH DAILY   Multiple Vitamins-Calcium  (ONE-A-DAY WOMENS PO) Take 1 tablet by mouth 2 (two) times daily.   sertraline  (ZOLOFT ) 50 MG tablet TAKE 1 TABLET(50 MG) BY MOUTH DAILY   [DISCONTINUED] lisinopril  (ZESTRIL ) 20 MG tablet Take 1 tablet (20 mg total) by mouth every morning.       02/01/2024    3:45 PM 04/05/2023    1:37 PM 02/22/2022    3:16 PM 01/19/2022    2:18 PM  GAD 7 : Generalized Anxiety Score  Nervous, Anxious, on Edge 1 3 1  0  Control/stop worrying 1 3 0 0  Worry too much - different things 1 3 0 0  Trouble relaxing 0 0 0 0  Restless 0 0 0 0  Easily annoyed or irritable 0 0 0 0  Afraid - awful might happen 0 0 0 0  Total GAD 7 Score 3 9 1  0  Anxiety Difficulty Not difficult at all Not difficult at all Not difficult at all Not difficult at all       02/01/2024    3:44 PM 04/05/2023    1:37 PM 02/01/2023    3:52 PM  Depression screen PHQ 2/9  Decreased Interest 1 3 1   Down, Depressed, Hopeless 1 3 1   PHQ - 2 Score 2 6 2   Altered sleeping 1 0 1  Tired,  decreased energy 0 0 1  Change in appetite 1 2 0  Feeling bad or failure about yourself  0 0 0  Trouble concentrating 0 0 0  Moving slowly or fidgety/restless 0 0 0  Suicidal thoughts 0 0 0  PHQ-9 Score 4 8 4   Difficult doing work/chores Not difficult at all Not difficult at all Not difficult at all    BP Readings from Last 3 Encounters:  02/01/24 (!) 142/68  02/01/24 (!) 193/98  10/05/23 (!) 177/83    Physical Exam Vitals and nursing note reviewed.  Constitutional:      General: She is not in acute distress. HENT:     Head: Normocephalic and atraumatic.  Pulmonary:     Effort: Pulmonary effort is normal. No respiratory distress.     Breath sounds: No decreased breath sounds or wheezing.  Abdominal:     Palpations: Abdomen is soft.     Tenderness: There is no abdominal tenderness.  Skin:    General: Skin is warm and dry.     Findings: No rash.  Neurological:     Mental Status: She is alert and oriented to person, place, and time.  Psychiatric:        Mood and Affect: Mood normal.        Behavior: Behavior normal.     Wt Readings from Last 3 Encounters:  02/01/24 119 lb (54 kg)  10/05/23 118 lb 3.2 oz (53.6 kg)  09/21/23 117 lb 6.4 oz (53.3 kg)    BP (!) 142/68   Pulse (!) 112   Ht 5' (1.524 m)   Wt 119 lb (54 kg)   SpO2 95%   BMI 23.24 kg/m   Assessment and Plan:  Problem List Items Addressed This Visit       Unprioritized   Atrial fibrillation with RVR (HCC)   Stable SR on Cardiazem. On Eliquis  bid. Now having vaginal bleeding and will likely need biopsy.      Relevant Medications   lisinopril  (ZESTRIL ) 20 MG tablet   Depression, major, single episode, moderate (HCC) (Chronic)   Clinically stable on Sertraline .   No SI or HI on evaluation. Plan to continue same medications for now.       Essential hypertension (Chronic)   Blood pressure is well controlled on cardiazem, lasix , lisinopril . No medication side effects noted. Plan to continue  current medications.       Relevant Medications   lisinopril  (ZESTRIL ) 20 MG tablet   Stage 3b chronic kidney disease (HCC) (Chronic)   Stable GFR; diuresed in the ED yesterday.      Other Visit Diagnoses       Abnormal vaginal bleeding    -  Primary   Will refer to GYN will likely need biopsy and will need Cardiology to clear her to hold Eliquis    Relevant Orders   Ambulatory referral to Obstetrics / Gynecology       No follow-ups on file.    Leita HILARIO Adie, MD [facility] [place]

## 2024-02-01 NOTE — Assessment & Plan Note (Signed)
 Blood pressure is well controlled on cardiazem, lasix , lisinopril . No medication side effects noted. Plan to continue current medications.

## 2024-02-01 NOTE — Assessment & Plan Note (Signed)
 Stable SR on Cardiazem. On Eliquis  bid. Now having vaginal bleeding and will likely need biopsy.

## 2024-02-07 ENCOUNTER — Encounter: Payer: Self-pay | Admitting: Obstetrics & Gynecology

## 2024-02-07 ENCOUNTER — Ambulatory Visit (INDEPENDENT_AMBULATORY_CARE_PROVIDER_SITE_OTHER): Admitting: Obstetrics & Gynecology

## 2024-02-07 VITALS — BP 167/78 | HR 78 | Ht 60.0 in | Wt 119.0 lb

## 2024-02-07 DIAGNOSIS — N950 Postmenopausal bleeding: Secondary | ICD-10-CM

## 2024-02-07 DIAGNOSIS — R9389 Abnormal findings on diagnostic imaging of other specified body structures: Secondary | ICD-10-CM

## 2024-02-07 NOTE — Progress Notes (Signed)
    GYNECOLOGY PROGRESS NOTE  Subjective:    Patient ID: Vanessa Farmer, female    DOB: June 21, 1935, 88 y.o.   MRN: [id number]  HPI  Patient is a 88 y.o. P5 is here as a new patient. She is brought here by her granddaughter with the issue of an occasion of PMB last week, rather heavy for 2 days, now not bleeding. She had a pelvic ultrasound that showed a 9 mm endometrium.  The following portions of the patient's history were reviewed and updated as appropriate: allergies, current medications, past family history, past medical history, past social history, past surgical history, and problem list.  Review of Systems Pertinent items are noted in HPI.   Objective:   Blood pressure (!) 167/78, pulse 78, height 5' (1.524 m), weight 119 lb (54 kg). Body mass index is 23.24 kg/m. Well nourished, well hydrated White female, no apparent distress She is ambulating with a walker and conversing normally. With her in the dorsal lithotomy position, I placed a Pederson speculum and noted her stenotic cervix. She was unable to tolerate the pressure of the speculum and I stopped the exam.   Assessment:  PMB unable to tolerate even a speculum exam. She is not a candidate for in office endometrial sampling.   Plan:   I offered the patient (with her granddaughter present) watchful waiting if they would not treat uterine cancer, versus referral to gyn onc for second opinion/sampling in the OR. After a long discussion with them, they opt for watchful waiting. I told her to call if the bleeding returns and I can prescribe megace to help with the bleeding. I gave her bleeding precautions/hemorrhage to go to the ED.

## 2024-02-09 ENCOUNTER — Ambulatory Visit

## 2024-02-09 ENCOUNTER — Encounter: Payer: Self-pay | Admitting: Pediatrics

## 2024-02-09 ENCOUNTER — Ambulatory Visit: Payer: Self-pay | Admitting: *Deleted

## 2024-02-09 ENCOUNTER — Ambulatory Visit (INDEPENDENT_AMBULATORY_CARE_PROVIDER_SITE_OTHER): Admitting: Pediatrics

## 2024-02-09 VITALS — BP 132/71 | HR 95 | Temp 98.6°F | Wt 121.6 lb

## 2024-02-09 DIAGNOSIS — N1832 Chronic kidney disease, stage 3b: Secondary | ICD-10-CM | POA: Diagnosis not present

## 2024-02-09 DIAGNOSIS — I10 Essential (primary) hypertension: Secondary | ICD-10-CM | POA: Diagnosis not present

## 2024-02-09 DIAGNOSIS — N939 Abnormal uterine and vaginal bleeding, unspecified: Secondary | ICD-10-CM

## 2024-02-09 DIAGNOSIS — R7309 Other abnormal glucose: Secondary | ICD-10-CM

## 2024-02-09 DIAGNOSIS — Z658 Other specified problems related to psychosocial circumstances: Secondary | ICD-10-CM

## 2024-02-09 DIAGNOSIS — M419 Scoliosis, unspecified: Secondary | ICD-10-CM | POA: Diagnosis not present

## 2024-02-09 DIAGNOSIS — M810 Age-related osteoporosis without current pathological fracture: Secondary | ICD-10-CM | POA: Diagnosis not present

## 2024-02-09 DIAGNOSIS — I4891 Unspecified atrial fibrillation: Secondary | ICD-10-CM

## 2024-02-09 DIAGNOSIS — L602 Onychogryphosis: Secondary | ICD-10-CM | POA: Diagnosis not present

## 2024-02-09 DIAGNOSIS — R296 Repeated falls: Secondary | ICD-10-CM

## 2024-02-09 DIAGNOSIS — Z7689 Persons encountering health services in other specified circumstances: Secondary | ICD-10-CM | POA: Diagnosis not present

## 2024-02-09 DIAGNOSIS — I5032 Chronic diastolic (congestive) heart failure: Secondary | ICD-10-CM

## 2024-02-09 DIAGNOSIS — R5383 Other fatigue: Secondary | ICD-10-CM

## 2024-02-09 MED ORDER — LISINOPRIL 30 MG PO TABS: 30.0000 mg | ORAL_TABLET | ORAL | 3 refills | Status: AC

## 2024-02-09 NOTE — Patient Instructions (Signed)
 Visit Information  Thank you for taking time to visit with me today. Please don't hesitate to contact me if I can be of assistance to you before our next scheduled appointment.  Our next appointment is by telephone on 02/15/24 at 12pm Please call the care guide team at [phone] if you need to cancel or reschedule your appointment.    Please call the Suicide and Crisis Lifeline: 988 call the [place]  National Suicide Prevention Lifeline: (636) 249-3261 or TTY: 206-872-4940 TTY 281-665-8188) to talk to a trained counselor call 1-800-273-TALK (toll free, 24 hour hotline) call 911 if you are experiencing a Mental Health or Behavioral Health Crisis or need someone to talk to.  Patient verbalizes understanding of instructions and care plan provided today and agrees to view in MyChart. Active MyChart status and patient understanding of how to access instructions and care plan via MyChart confirmed with patient.     Finch Costanzo, LCSW [place]  [facility], [facility] Licensed Clinical Social Worker  Direct Dial: [phone]

## 2024-02-09 NOTE — Patient Instructions (Addendum)
 We are increasing your lisinopril  to 30mg  from 20mg .  Good to meet you! Welcome to [facility]!  As your primary care doctor, I look forward to working with you to help you reach your health goals.  Please be aware of a couple of logistical items: - If you message me on mychart, it may take me 1-2 business days to get back to you. This is for non-urgent messaging.  - If you require urgent clinical attention, please call the clinic or present to urgent care/emergency room - If you have labs, I typically will send a message about them in 1-2 business days. - I am not here on Mondays, otherwise will be available from Tuesday-Friday during 8a-5pm.
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
## 2024-02-09 NOTE — Assessment & Plan Note (Signed)
 Chronic low back pain with spinal deformity, exacerbated by falls and previous trauma. Significant pain, especially when using the bathroom. No current pain management regimen. - Recommend Tylenol  for pain. - Provide Voltaren cream for topical relief. - Consider physical therapy referral if pain persists.
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
## 2024-02-09 NOTE — Assessment & Plan Note (Signed)
 On diltiazem  240mg  and eliquis  2.5mg  BID. Stable.

## 2024-02-09 NOTE — Assessment & Plan Note (Signed)
 Chronic diastolic heart failure with lower extremity edema. Significant leg and foot swelling. On diuretics with frequent urination and difficulty holding urine. - Continue current diuretic therapy. - Consider compression stockings. - Elevate legs to reduce swelling. - Evaluate blood work for contributing factors.

## 2024-02-10 ENCOUNTER — Encounter: Payer: Self-pay | Admitting: *Deleted

## 2024-02-10 NOTE — Progress Notes (Unsigned)
 This encounter was created in error - please disregard.
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
## 2024-02-11 LAB — IRON AND TIBC
Iron Saturation: 23 % (ref 15–55)
Iron: 60 ug/dL (ref 27–139)
Total Iron Binding Capacity: 258 ug/dL (ref 250–450)
UIBC: 198 ug/dL (ref 118–369)

## 2024-02-11 LAB — LIPID PANEL
Chol/HDL Ratio: 2.5 ratio (ref 0.0–4.4)
Cholesterol, Total: 157 mg/dL (ref 100–199)
HDL: 62 mg/dL (ref >39)
LDL Chol Calc (NIH): 78 mg/dL (ref 0–99)
Triglycerides: 94 mg/dL (ref 0–149)
VLDL Cholesterol Cal: 17 mg/dL (ref 5–40)

## 2024-02-11 LAB — CBC WITH DIFFERENTIAL/PLATELET
Basophils Absolute: 0 x10E3/uL (ref 0.0–0.2)
Basos: 0 %
EOS (ABSOLUTE): 0.1 x10E3/uL (ref 0.0–0.4)
Eos: 4 %
Hematocrit: 34.4 % (ref 34.0–46.6)
Hemoglobin: 11.6 g/dL (ref 11.1–15.9)
Immature Grans (Abs): 0 x10E3/uL (ref 0.0–0.1)
Immature Granulocytes: 0 %
Lymphocytes Absolute: 0.9 x10E3/uL (ref 0.7–3.1)
Lymphs: 41 %
MCH: 37.1 pg — ABNORMAL HIGH (ref 26.6–33.0)
MCHC: 33.7 g/dL (ref 31.5–35.7)
MCV: 110 fL — ABNORMAL HIGH (ref 79–97)
Monocytes Absolute: 0.1 x10E3/uL (ref 0.1–0.9)
Monocytes: 4 %
Neutrophils Absolute: 1.2 x10E3/uL — ABNORMAL LOW (ref 1.4–7.0)
Neutrophils: 51 %
Platelets: 155 x10E3/uL (ref 150–450)
RBC: 3.13 x10E6/uL — ABNORMAL LOW (ref 3.77–5.28)
RDW: 12.8 % (ref 11.7–15.4)
WBC: 2.3 x10E3/uL — CL (ref 3.4–10.8)

## 2024-02-11 LAB — COMPREHENSIVE METABOLIC PANEL WITH GFR
ALT: 14 IU/L (ref 0–32)
AST: 21 IU/L (ref 0–40)
Albumin: 4.1 g/dL (ref 3.7–4.7)
Alkaline Phosphatase: 112 IU/L (ref 48–129)
BUN/Creatinine Ratio: 16 (ref 12–28)
BUN: 19 mg/dL (ref 8–27)
Bilirubin Total: 0.4 mg/dL (ref 0.0–1.2)
CO2: 24 mmol/L (ref 20–29)
Calcium: 9.1 mg/dL (ref 8.7–10.3)
Chloride: 101 mmol/L (ref 96–106)
Creatinine, Ser: 1.19 mg/dL — ABNORMAL HIGH (ref 0.57–1.00)
Globulin, Total: 2.4 g/dL (ref 1.5–4.5)
Glucose: 116 mg/dL — ABNORMAL HIGH (ref 70–99)
Potassium: 4.1 mmol/L (ref 3.5–5.2)
Sodium: 140 mmol/L (ref 134–144)
Total Protein: 6.5 g/dL (ref 6.0–8.5)
eGFR: 45 mL/min/1.73 — ABNORMAL LOW (ref >59)

## 2024-02-11 LAB — HEMOGLOBIN A1C
Est. average glucose Bld gHb Est-mCnc: 108 mg/dL
Hgb A1c MFr Bld: 5.4 % (ref 4.8–5.6)

## 2024-02-11 LAB — FERRITIN: Ferritin: 104 ng/mL (ref 15–150)

## 2024-02-11 LAB — FOLATE: Folate: >20 ng/mL (ref >3.0)

## 2024-02-11 LAB — VITAMIN D 25 HYDROXY (VIT D DEFICIENCY, FRACTURES): Vit D, 25-Hydroxy: 51.6 ng/mL (ref 30.0–100.0)

## 2024-02-11 LAB — BRAIN NATRIURETIC PEPTIDE: BNP: 98.5 pg/mL (ref 0.0–100.0)

## 2024-02-11 LAB — VITAMIN B12: Vitamin B-12: 904 pg/mL (ref 232–1245)

## 2024-02-11 LAB — TSH: TSH: 1.68 u[IU]/mL (ref 0.450–4.500)

## 2024-02-14 ENCOUNTER — Encounter: Payer: Self-pay | Admitting: Pediatrics

## 2024-02-14 ENCOUNTER — Telehealth: Payer: Self-pay

## 2024-02-14 ENCOUNTER — Ambulatory Visit: Payer: Self-pay | Admitting: Pediatrics

## 2024-02-14 NOTE — Assessment & Plan Note (Signed)
 Plan on repeating blood work as above.

## 2024-02-14 NOTE — Progress Notes (Signed)
   Telephone encounter was:  Unsuccessful.  02/14/2024 Name: Vanessa Farmer MRN: [id number] DOB: [birth year]  Unsuccessful outbound call made today to assist with:  Food Insecurity and Financial Difficulties related to financial strain   Outreach Attempt:  1st Attempt  No answer and unable to leave a message   Jon Colt [facility]  [facility] Guide, Phone: [phone] Fax: [phone] Website: [email or web address]

## 2024-02-15 ENCOUNTER — Other Ambulatory Visit: Payer: Self-pay | Admitting: *Deleted

## 2024-02-15 ENCOUNTER — Encounter: Payer: Self-pay | Admitting: *Deleted

## 2024-02-15 NOTE — Patient Instructions (Signed)
 Vanessa Farmer - I am sorry I was unable to reach you today.  I work with Herold, Hadassah SQUIBB, MD and am calling to support your healthcare needs. Please contact me at [phone] at your earliest convenience. I look forward to speaking with you soon.   Thank you,    Ryn Peine, LCSW [place]  [facility], [facility] Licensed Clinical Social Worker  Direct Dial: [phone]

## 2024-02-16 ENCOUNTER — Telehealth: Payer: Self-pay

## 2024-02-16 NOTE — Progress Notes (Signed)
   Telephone encounter was:  Successful.  Complex Care Management Note Care Guide Note  02/16/2024 Name: Vanessa Farmer MRN: [id number] DOB: [birth year]  [name] is a 88 y.o. year old female who is a primary care patient of Herold, Hadassah SQUIBB, MD . The community resource team was consulted for assistance with Food Insecurity and Financial Difficulties related to financial strain  SDOH screenings and interventions completed:  Yes        Care guide performed the following interventions: Patient provided with information about care guide support team and interviewed to confirm resource needs.  Follow Up Plan:  No further follow up planned at this time. The patient has been provided with needed resources.  Encounter Outcome:  Patient Visit Completed    Jon Colt [facility]  [facility] Guide, Phone: [phone] Fax: [phone] Website: [email or web address]

## 2024-02-20 ENCOUNTER — Other Ambulatory Visit: Payer: Self-pay | Admitting: *Deleted
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
## 2024-02-21 NOTE — Patient Instructions (Signed)
 Visit Information  Thank you for taking time to visit with me today. Please don't hesitate to contact me if I can be of assistance to you before our next scheduled appointment.  Our next appointment is by telephone on 03/05/24 at 03:30pm Please call the care guide team at [phone] if you need to cancel or reschedule your appointment.   Following is a copy of your care plan:   Goals Addressed             This Visit's Progress    VBC[facility] Social Work Care Plan       Problems:   Lacks knowledge of how to connect to resources to replace food stamp, insurance card,Identification card as well as to reinstate CAP services  CSW Clinical Goal(s):   Over the next 90 days the Caregiver will work with Child psychotherapist to address concerns related to replacement cards and reinstating CAP services.  Interventions:  Discussed need to contact the Department of Social Services to confirm address  and contact information as well as to request replacement food stamp and medicaid card              Discussed process of obtaining replacement identification and medicare card               CSW will follow up with [facility] to confirm process for reinstating CAP services       Patient Goals/Self-Care Activities:  Call Department of Social Services to request replacement cards(food stamps and Medicaid card   Plan:   Telephone follow up appointment with care management team member scheduled for:  03/05/24        Please call the Suicide and Crisis Lifeline: 988 call the [place]  National Suicide Prevention Lifeline: (772) 305-1011 or TTY: (934)549-9491 TTY 6707060407) to talk to a trained counselor call 1-800-273-TALK (toll free, 24 hour hotline) call 911 if you are experiencing a Mental Health or Behavioral Health Crisis or need someone to talk to.  Patient verbalizes understanding of instructions and care plan provided today and agrees to view in MyChart. Active MyChart status and patient understanding  of how to access instructions and care plan via MyChart confirmed with patient.     Harbour Nordmeyer, LCSW [place]  [facility], [facility] Licensed Clinical Social Worker  Direct Dial: [phone]

## 2024-02-24 ENCOUNTER — Ambulatory Visit: Admitting: Pediatrics

## 2024-03-05 ENCOUNTER — Encounter: Payer: Self-pay | Admitting: *Deleted

## 2024-03-05 ENCOUNTER — Telehealth: Payer: Self-pay | Admitting: *Deleted

## 2024-03-05 NOTE — Patient Instructions (Signed)
 Vanessa Farmer - I am sorry I was unable to reach you today for our scheduled appointment. I work with Herold, Hadassah SQUIBB, MD and am calling to support your healthcare needs. Please contact me at [phone] at your earliest convenience. I look forward to speaking with you soon.   Thank you,    Duong Haydel, LCSW [place]  [facility], [facility] Licensed Clinical Social Worker  Direct Dial: [phone]

## 2024-03-28 ENCOUNTER — Encounter: Payer: Self-pay | Admitting: *Deleted

## 2024-03-28 ENCOUNTER — Telehealth: Payer: Self-pay | Admitting: *Deleted

## 2024-03-28 NOTE — Patient Instructions (Signed)
 Maceo CHRISTELLA Qua - I am sorry I was unable to reach you today for our scheduled appointment. I work with Herold, Hadassah SQUIBB, MD and am calling to support your healthcare needs. Please contact me at [phone] at your earliest convenience. I look forward to speaking with you soon.   Thank you,    Duong Haydel, LCSW [place]  [facility], [facility] Licensed Clinical Social Worker  Direct Dial: [phone]

## 2024-03-29 ENCOUNTER — Other Ambulatory Visit: Payer: Self-pay | Admitting: Internal Medicine

## 2024-03-29 DIAGNOSIS — E785 Hyperlipidemia, unspecified: Secondary | ICD-10-CM

## 2024-03-29 DIAGNOSIS — I482 Chronic atrial fibrillation, unspecified: Secondary | ICD-10-CM

## 2024-03-29 DIAGNOSIS — I10 Essential (primary) hypertension: Secondary | ICD-10-CM

## 2024-03-29 DIAGNOSIS — F321 Major depressive disorder, single episode, moderate: Secondary | ICD-10-CM

## 2024-03-30 NOTE — Telephone Encounter (Signed)
 Requested Prescriptions  Pending Prescriptions Disp Refills   diltiazem  (CARDIZEM  CD) 240 MG 24 hr capsule [Pharmacy Med Name: DILTIAZEM  CD 240MG  CAPSULES (24 HR)] 90 capsule 0    Sig: TAKE 1 CAPSULE(240 MG) BY MOUTH DAILY     Cardiovascular: Calcium  Channel Blockers 3 Failed - 03/30/2024  1:58 PM      Failed - Cr in normal range and within 360 days    Creatinine, Ser  Date Value Ref Range Status  02/09/2024 1.19 (H) 0.57 - 1.00 mg/dL Final         Passed - ALT in normal range and within 360 days    ALT  Date Value Ref Range Status  02/09/2024 14 0 - 32 IU/L Final         Passed - AST in normal range and within 360 days    AST  Date Value Ref Range Status  02/09/2024 21 0 - 40 IU/L Final         Passed - Last BP in normal range    BP Readings from Last 1 Encounters:  02/09/24 132/71         Passed - Last Heart Rate in normal range    Pulse Readings from Last 1 Encounters:  02/09/24 95         Passed - Valid encounter within last 6 months    Recent Outpatient Visits           1 month ago Chronic heart failure with preserved ejection fraction (HFpEF) (HCC)   [place] [facility] Herold Hadassah SQUIBB, MD   1 month ago Abnormal vaginal bleeding   [place] [facility], Leita DEL, MD               ELIQUIS  2.5 MG TABS tablet [Pharmacy Med Name: ELIQUIS  2.5MG  TABLETS] 180 tablet 0    Sig: TAKE 1 TABLET(2.5 MG) BY MOUTH TWICE DAILY     Hematology:  Anticoagulants - apixaban  Failed - 03/30/2024  1:58 PM      Failed - Cr in normal range and within 360 days    Creatinine, Ser  Date Value Ref Range Status  02/09/2024 1.19 (H) 0.57 - 1.00 mg/dL Final         Passed - PLT in normal range and within 360 days    Platelets  Date Value Ref Range Status  02/09/2024 155 150 - 450 x10E3/uL Final         Passed - HGB in normal range and within 360 days    Hemoglobin  Date Value Ref Range Status  02/09/2024 11.6  11.1 - 15.9 g/dL Final         Passed - HCT in normal range and within 360 days    Hematocrit  Date Value Ref Range Status  02/09/2024 34.4 34.0 - 46.6 % Final         Passed - AST in normal range and within 360 days    AST  Date Value Ref Range Status  02/09/2024 21 0 - 40 IU/L Final         Passed - ALT in normal range and within 360 days    ALT  Date Value Ref Range Status  02/09/2024 14 0 - 32 IU/L Final         Passed - Valid encounter within last 12 months    Recent Outpatient Visits           1  month ago Chronic heart failure with preserved ejection fraction (HFpEF) (HCC)   [place] [facility] Herold Hadassah SQUIBB, MD   1 month ago Abnormal vaginal bleeding   [place] [facility], Leita DEL, MD               furosemide  (LASIX ) 20 MG tablet [Pharmacy Med Name: FUROSEMIDE  20MG  TABLETS] 90 tablet 0    Sig: TAKE 1 TABLET(20 MG) BY MOUTH DAILY     Cardiovascular:  Diuretics - Loop Failed - 03/30/2024  1:58 PM      Failed - Cr in normal range and within 180 days    Creatinine, Ser  Date Value Ref Range Status  02/09/2024 1.19 (H) 0.57 - 1.00 mg/dL Final         Failed - Mg Level in normal range and within 180 days    Magnesium   Date Value Ref Range Status  10/13/2022 1.9 1.7 - 2.4 mg/dL Final    Comment:    Performed at [facility], [address] [place], [place] [address]         Passed - K in normal range and within 180 days    Potassium  Date Value Ref Range Status  02/09/2024 4.1 3.5 - 5.2 mmol/L Final         Passed - Ca in normal range and within 180 days    Calcium   Date Value Ref Range Status  02/09/2024 9.1 8.7 - 10.3 mg/dL Final         Passed - Na in normal range and within 180 days    Sodium  Date Value Ref Range Status  02/09/2024 140 134 - 144 mmol/L Final         Passed - Cl in normal range and within 180 days    Chloride  Date Value Ref Range Status   02/09/2024 101 96 - 106 mmol/L Final         Passed - Last BP in normal range    BP Readings from Last 1 Encounters:  02/09/24 132/71         Passed - Valid encounter within last 6 months    Recent Outpatient Visits           1 month ago Chronic heart failure with preserved ejection fraction (HFpEF) (HCC)   [place] [facility] Herold Hadassah SQUIBB, MD   1 month ago Abnormal vaginal bleeding   [place] [facility], Leita DEL, MD               sertraline  (ZOLOFT ) 50 MG tablet [Pharmacy Med Name: SERTRALINE  50MG  TABLETS] 90 tablet 0    Sig: TAKE 1 TABLET(50 MG) BY MOUTH DAILY     Psychiatry:  Antidepressants - SSRI - sertraline  Passed - 03/30/2024  1:58 PM      Passed - AST in normal range and within 360 days    AST  Date Value Ref Range Status  02/09/2024 21 0 - 40 IU/L Final         Passed - ALT in normal range and within 360 days    ALT  Date Value Ref Range Status  02/09/2024 14 0 - 32 IU/L Final         Passed - Completed PHQ-2 or PHQ-9 in the last 360 days      Passed - Valid encounter within last 6 months  Recent Outpatient Visits           1 month ago Chronic heart failure with preserved ejection fraction (HFpEF) (HCC)   [place] [facility] Herold Hadassah SQUIBB, MD   1 month ago Abnormal vaginal bleeding   [place] [facility], Leita DEL, MD               atorvastatin  (LIPITOR) 40 MG tablet [Pharmacy Med Name: ATORVASTATIN  40MG  TABLETS] 90 tablet 0    Sig: TAKE 1 TABLET(40 MG) BY MOUTH DAILY     Cardiovascular:  Antilipid - Statins Failed - 03/30/2024  1:58 PM      Failed - Lipid Panel in normal range within the last 12 months    Cholesterol, Total  Date Value Ref Range Status  02/09/2024 157 100 - 199 mg/dL Final   LDL Chol Calc (NIH)  Date Value Ref Range Status  02/09/2024 78 0 - 99 mg/dL Final   HDL  Date Value  Ref Range Status  02/09/2024 62 >39 mg/dL Final   Triglycerides  Date Value Ref Range Status  02/09/2024 94 0 - 149 mg/dL Final         Passed - Patient is not pregnant      Passed - Valid encounter within last 12 months    Recent Outpatient Visits           1 month ago Chronic heart failure with preserved ejection fraction (HFpEF) (HCC)   [place] [facility] Herold Hadassah SQUIBB, MD   1 month ago Abnormal vaginal bleeding   [facility], Leita DEL, MD

## 2024-04-04 ENCOUNTER — Other Ambulatory Visit: Payer: Self-pay

## 2024-04-04 ENCOUNTER — Emergency Department

## 2024-04-04 ENCOUNTER — Ambulatory Visit: Payer: Self-pay

## 2024-04-04 ENCOUNTER — Emergency Department: Admission: EM | Admit: 2024-04-04 | Discharge: 2024-04-04 | Disposition: A | Discharge status: Home / Self Care

## 2024-04-04 ENCOUNTER — Encounter: Payer: Self-pay | Admitting: *Deleted

## 2024-04-04 DIAGNOSIS — I509 Heart failure, unspecified: Secondary | ICD-10-CM | POA: Insufficient documentation

## 2024-04-04 DIAGNOSIS — R0602 Shortness of breath: Secondary | ICD-10-CM | POA: Diagnosis not present

## 2024-04-04 DIAGNOSIS — R600 Localized edema: Secondary | ICD-10-CM | POA: Insufficient documentation

## 2024-04-04 DIAGNOSIS — M7989 Other specified soft tissue disorders: Secondary | ICD-10-CM | POA: Insufficient documentation

## 2024-04-04 LAB — CBC
HCT: 33.9 % — ABNORMAL LOW (ref 36.0–46.0)
Hemoglobin: 11.4 g/dL — ABNORMAL LOW (ref 12.0–15.0)
MCH: 36.3 pg — ABNORMAL HIGH (ref 26.0–34.0)
MCHC: 33.6 g/dL (ref 30.0–36.0)
MCV: 108 fL — ABNORMAL HIGH (ref 80.0–100.0)
Platelets: 117 K/uL — ABNORMAL LOW (ref 150–400)
RBC: 3.14 MIL/uL — ABNORMAL LOW (ref 3.87–5.11)
RDW: 12.9 % (ref 11.5–15.5)
WBC: 2.1 K/uL — ABNORMAL LOW (ref 4.0–10.5)
nRBC: 0 % (ref 0.0–0.2)

## 2024-04-04 LAB — BASIC METABOLIC PANEL WITH GFR
Anion gap: 10 (ref 5–15)
BUN: 21 mg/dL (ref 8–23)
CO2: 27 mmol/L (ref 22–32)
Calcium: 9.1 mg/dL (ref 8.9–10.3)
Chloride: 102 mmol/L (ref 98–111)
Creatinine, Ser: 1.29 mg/dL — ABNORMAL HIGH (ref 0.44–1.00)
GFR, Estimated: 40 mL/min — ABNORMAL LOW (ref >60)
Glucose, Bld: 106 mg/dL — ABNORMAL HIGH (ref 70–99)
Potassium: 4.3 mmol/L (ref 3.5–5.1)
Sodium: 139 mmol/L (ref 135–145)

## 2024-04-04 LAB — PROTIME-INR
INR: 1.1 (ref 0.8–1.2)
Prothrombin Time: 14.6 s (ref 11.4–15.2)

## 2024-04-04 LAB — TROPONIN T, HIGH SENSITIVITY: Troponin T High Sensitivity: 34 ng/L — ABNORMAL HIGH (ref 0–19)

## 2024-04-04 LAB — PRO BRAIN NATRIURETIC PEPTIDE: Pro Brain Natriuretic Peptide: 896 pg/mL — ABNORMAL HIGH

## 2024-04-04 MED ORDER — POTASSIUM CHLORIDE CRYS ER 20 MEQ PO TBCR: 20.0000 meq | EXTENDED_RELEASE_TABLET | Freq: Every day | ORAL | 0 refills | Status: AC

## 2024-04-04 MED ORDER — FUROSEMIDE 40 MG PO TABS
20.0000 mg | ORAL_TABLET | Freq: Once | ORAL | Status: AC
  Administered 2024-04-04: 20 mg via ORAL
  Filled 2024-04-04: qty 1

## 2024-04-04 NOTE — Telephone Encounter (Signed)
 FYI Only or Action Required?: FYI only for provider: ED advised.  Patient was last seen in primary care on 02/09/2024 by Herold Hadassah SQUIBB, MD.  Called Nurse Triage reporting Leg Swelling.  Symptoms began yesterday.  Interventions attempted: Prescription medications: lasix .  Symptoms are: gradually worsening.  Triage Disposition: Go to ED Now (or PCP Triage)  Patient/caregiver understands and will follow disposition?: Yes   Copied from CRM [id number]. Topic: Clinical - Red Word Triage >> Apr 04, 2024 11:44 AM Tinnie BROCKS wrote: Red Word that prompted transfer to Nurse Triage: Granddaughter Tabitha calling to report that she saw pts legs this morning and they were extremely swollen, requesting to speak with nurse. Established with [facility]. Reason for Disposition  Patient sounds very sick or weak to the triager  Answer Assessment - Initial Assessment Questions Pt's granddaughter called stating that pt is having worsening swelling, below the knee all the way down to her feet. Granddaughter made her take her socks and slippers off because they were leaving big indents in her legs. She states she always has swelling but it is worse than normal. The grandaughter also said all of her toes are darker shade of blue. Pt does have shortness of breath, but granddaughter stated no more than normal. Rn did advise ER given the discoloration of her toes being a new symptom and the swelling the pt is having. Pt's granddaughter stated understanding.      1. MAIN CONCERN OR SYMPTOM: What is your main concern right now? What's the main symptom you're worried about? (e.g., breathing difficulty, ankle swelling, weight gain).     Leg swelling 2. ONSET: When did the  swelling  start?     Granddaughter said she noticed it today 3. BREATHING DIFFICULTY: Are you having any difficulty breathing? If Yes, ask: How bad is it?  (e.g., none, mild, moderate, severe)  Is this worse than usual for you?      States her grandma is always short of breath no worse thank normal 4. EDEMA - FOOT-LEG SWELLING: Do you have swelling of your ankles, feet or legs? If Yes, ask: How bad is the swelling? (e.g., localized; mild, moderate, severe)     Swelling extends from just below knees all the way to toes on both legs.  5. OTHER SYMPTOMS: Do you have any other symptoms? (e.g., depression, weakness or fatigue, abdomen bloating, hacky cough)     Some weakness 6. DIURETICS: Are you currently taking water  pills? (e.g., furosemide  [Lasix ], hydrochlorothiazide  [HCTZ], bumetanide [Bumex], metolazone [Zaroxolyn]) If Yes, ask: What medicine are you taking, and how often?  Any recent change in dose?      Lasix , is taking as ordered  Protocols used: Heart Failure on Treatment Follow-up Call-A-AH

## 2024-04-04 NOTE — Discharge Instructions (Signed)
 You were seen today due to concern of leg swelling.  At this time I suspect this is likely due to fluid buildup on your legs.  I have written for some potassium supplements for you to take, please start taking 2 tablets of your water  pill at home every day instead of the 1 tablet that you have been taking now.  If you start having any worsening of symptoms such as increased swelling in the legs, chest pain, shortness of breath, lightheadedness, or any other symptoms you find concerning please return to the emergency department immediately for further medical management.  I would otherwise recommend following up with your cardiologist in the outpatient setting.  Given your toenails, you need to see a podiatrist, please call them using the phone number provided to arrange for an outpatient follow-up.

## 2024-04-04 NOTE — ED Provider Notes (Signed)
 [facility] Provider Note    Event Date/Time   First MD Initiated Contact with Patient 04/04/24 1949     (approximate)   History   Leg Swelling   HPI  Vanessa Farmer is a 88 y.o. female who presents today with concern of leg swelling.  Apparently over the last few days having some increased swelling in her extremities.  She is also concerned that she has developed progressive toenail disease and discoloration over the last few months.  She has a history of underlying CHF she takes Lasix  20 mg daily, but despite this has had increased swelling in her extremities.  She states she is chronically short of breath, but has not noticed any new change recently, denies any associated chest pain or lightheadedness symptoms.  She is primarily wheelchair bound but does use a walker to help her get around at times.     Physical Exam   Triage Vital Signs: ED Triage Vitals  Encounter Vitals Group     BP 04/04/24 1712 (!) 159/80     Girls Systolic BP Percentile --      Girls Diastolic BP Percentile --      Boys Systolic BP Percentile --      Boys Diastolic BP Percentile --      Pulse Rate 04/04/24 1712 85     Resp 04/04/24 1712 20     Temp 04/04/24 1712 98 F (36.7 C)     Temp Source 04/04/24 1712 Oral     SpO2 04/04/24 1712 98 %     Weight 04/04/24 1709 121 lb 4.1 oz (55 kg)     Height 04/04/24 1709 5' (1.524 m)     Head Circumference --      Peak Flow --      Pain Score --      Pain Loc --      Pain Education --      Exclude from Growth Chart --     Most recent vital signs: Vitals:   04/04/24 1712  BP: (!) 159/80  Pulse: 85  Resp: 20  Temp: 98 F (36.7 C)  SpO2: 98%     General: Awake, no distress.  CV:  Good peripheral perfusion.  Bilateral 1+ pitting edema in the lower extremities, able to speak in full sentences Resp:  Normal effort.  Clear to auscultation bilaterally Abd:  No distention.  Soft nontender Feet:  Significant toenail disease in  all toes greatest in the great toes.  Feet appear to be well-perfused Other:     ED Results / Procedures / Treatments   Labs (all labs ordered are listed, but only abnormal results are displayed) Labs Reviewed  BASIC METABOLIC PANEL WITH GFR - Abnormal; Notable for the following components:      Result Value   Glucose, Bld 106 (*)    Creatinine, Ser 1.29 (*)    GFR, Estimated 40 (*)    All other components within normal limits  CBC - Abnormal; Notable for the following components:   WBC 2.1 (*)    RBC 3.14 (*)    Hemoglobin 11.4 (*)    HCT 33.9 (*)    MCV 108.0 (*)    MCH 36.3 (*)    Platelets 117 (*)    All other components within normal limits  PRO BRAIN NATRIURETIC PEPTIDE - Abnormal; Notable for the following components:   Pro Brain Natriuretic Peptide 896.0 (*)    All other components within normal limits  TROPONIN T, HIGH SENSITIVITY - Abnormal; Notable for the following components:   Troponin T High Sensitivity 34 (*)    All other components within normal limits  PROTIME-INR  TROPONIN T, HIGH SENSITIVITY     EKG  Sinus rhythm with rate of about 80, axis of 0, QRS is widened consistent with a left bundle branch block, remaining intervals appear to be within normal limits, no obvious ischemia.   RADIOLOGY No acute cardiopulmonary findings  PROCEDURES:  Critical Care performed: No  Procedures   MEDICATIONS ORDERED IN ED: Medications  furosemide  (LASIX ) tablet 20 mg (has no administration in time range)     IMPRESSION / MDM / ASSESSMENT AND PLAN / ED COURSE  I reviewed the triage vital signs and the nursing notes.                               Patient's presentation is most consistent with acute complicated illness / injury requiring diagnostic workup.  88 year old female who presents today with concern of bilateral lower extremity swelling.  She has no affiliated chest pain or shortness of breath.  She appears well she is not in any acute distress  she is currently saturating appropriately on room air.  EKG does not show significant ischemic evidence, chest x-ray radiology read demonstrates possible atelectasis versus pneumonia however patient without any clinical findings or symptoms of pneumonia.  She does appear to have evidence of some slight fluid overload but given her current clinical picture I believe reasonable for increasing her home Lasix  dose with urgent cardiology follow-up.  I discussed with the patient and she agrees with the plan and is reasonable with follow-up in the outpatient setting.  I discussed return precautions.       FINAL CLINICAL IMPRESSION(S) / ED DIAGNOSES   Final diagnoses:  Leg swelling     Rx / DC Orders   ED Discharge Orders          Ordered    potassium chloride  SA (KLOR-CON  M) 20 MEQ tablet  Daily        04/04/24 2022    Ambulatory referral to Cardiology       Comments: If you have not heard from the Cardiology office within the next 72 hours please call [phone].   04/04/24 2022             Note:  This document was prepared using Dragon voice recognition software and may include unintentional dictation errors.   Fernand Rossie HERO, MD 04/04/24 2026

## 2024-04-04 NOTE — ED Triage Notes (Addendum)
 Pt to triage via wheelchair.  Pt has swelling to both lower legs for several months.  Pt reports sob.  No chest pain.  Hx afib, chf.  Pt alert  speech clear

## 2024-04-17 ENCOUNTER — Other Ambulatory Visit: Payer: Self-pay | Admitting: *Deleted

## 2024-04-18 NOTE — Patient Instructions (Signed)
 Visit Information  Thank you for taking time to visit with me today. Please don't hesitate to contact me if I can be of assistance to you before our next scheduled appointment.  Your next care management appointment is by telephone on 05/15/24 at 1:00pm    Please call the care guide team at [phone] if you need to cancel, schedule, or reschedule an appointment.   Please call the Suicide and Crisis Lifeline: 988 call the [place]  National Suicide Prevention Lifeline: 781-200-6457 or TTY: 670-369-2124 TTY 586 205 7948) to talk to a trained counselor call 1-800-273-TALK (toll free, 24 hour hotline) call 911 if you are experiencing a Mental Health or Behavioral Health Crisis or need someone to talk to.  Rhandi Despain, LCSW [place]  [facility], [facility] Licensed Clinical Social Worker  Direct Dial: [phone]

## 2024-04-18 NOTE — Patient Outreach (Signed)
 Complex Care Management   Visit Note  04/18/2024  Name:  Vanessa Farmer MRN: [id number] DOB: February 26, 1936  Situation: Referral received for Complex Care Management related to in home care needs I obtained verbal consent from Caregiver.  Visit completed with Caregiver  on the phone on 04/17/24.  Background:   Past Medical History:  Diagnosis Date   (HFpEF) heart failure with preserved ejection fraction (HCC)    a.) TTE 12/31/2021: EF 55-60%, no RWMAs, G1DD, mod LAE, mod MAC, norm RVSF, RVSP 19.9, mild MR.   Acute cholecystitis 10/11/2022   Anemia    Anginal pain    Aortic atherosclerosis    Asthma    Atrial fibrillation with RVR (HCC)    a.) CHA2DS2-VASc = 8 (age x2, sex, HFpEF, HTN, DVT x2, vascular disease history) as of 05/30/2023; b.) cardiac rate/rhythm maintained on oral diltiazem ; chronically anticoagulated using dose reduced apixaban    CAD (coronary artery disease)    Calculus of gallbladder with chronic cholecystitis with obstruction 10/11/2022   CKD stage 3b, GFR 30-44 ml/min (HCC)    COPD (chronic obstructive pulmonary disease) (HCC)    Coronary artery disease    Depression    Fall    GERD (gastroesophageal reflux disease)    Hiatal hernia    History of bilateral cataract extraction 2021   History of DVT of lower extremity    Hyperlipidemia    Hypertension    Myocardial infarction [facility])    a.) approx 2003; reported to be mild; details unclear   On apixaban  therapy    Osteopenia    Polyp of sigmoid colon    Protein-calorie malnutrition, moderate    QT prolongation    Scoliosis     Assessment: Patient's daughter requesting assistance with reinstating . CAP services and would like to be patient's caregiver. Food stamp card has been reinstated Patient Reported Symptoms:  Cognitive Cognitive Status: No symptoms reported      Neurological Neurological Review of Symptoms: No symptoms reported    HEENT HEENT Symptoms Reported: No symptoms reported       Cardiovascular Cardiovascular Symptoms Reported: Swelling in legs or feet Does patient have uncontrolled Hypertension?: No    Respiratory Respiratory Symptoms Reported: Shortness of breath Respiratory Management Strategies: Medication therapy Respiratory Self-Management Outcome: 3 (uncertain)  Endocrine Endocrine Symptoms Reported: No symptoms reported    Gastrointestinal Gastrointestinal Symptoms Reported: No symptoms reported      Genitourinary Genitourinary Symptoms Reported: No symptoms reported    Integumentary Integumentary Symptoms Reported: Bruising Additional Integumentary Details: continues on blood thinners Skin Management Strategies: Medication therapy  Musculoskeletal Musculoskelatal Symptoms Reviewed: Back pain, Unsteady gait, Limited mobility Additional Musculoskeletal Details: continues to use a walker when ambulatig Musculoskeletal Management Strategies: Coping strategies, Adequate rest      Psychosocial Psychosocial Symptoms Reported: No symptoms reported          04/18/2024    PHQ2-9 Depression Screening   Little interest or pleasure in doing things    Feeling down, depressed, or hopeless    PHQ-2 - Total Score    Trouble falling or staying asleep, or sleeping too much    Feeling tired or having little energy    Poor appetite or overeating     Feeling bad about yourself - or that you are a failure or have let yourself or your family down    Trouble concentrating on things, such as reading the newspaper or watching television    Moving or speaking so slowly that other people could have  noticed.  Or the opposite - being so fidgety or restless that you have been moving around a lot more than usual    Thoughts that you would be better off dead, or hurting yourself in some way    PHQ2-9 Total Score    If you checked off any problems, how difficult have these problems made it for you to do your work, take care of things at home, or get along with other people     Depression Interventions/Treatment      There were no vitals filed for this visit.    Medications Reviewed Today     Reviewed by Ermalinda Lenn HERO, LCSW (Social Worker) on 04/17/24 at 1516  Med List Status: <None>   Medication Order Taking? Sig Documenting Provider Last Dose Status Informant  acetaminophen  (TYLENOL ) 500 MG tablet [id number] Yes Take 2 tablets (1,000 mg total) by mouth every 6 (six) hours as needed for mild pain (pain score 1-3). Desiderio Schanz, MD  Active   atorvastatin  (LIPITOR) 40 MG tablet [id number] Yes TAKE 1 TABLET(40 MG) BY MOUTH DAILY Berglund, Laura H, MD  Active   diltiazem  (CARDIZEM  CD) 240 MG 24 hr capsule [id number] Yes TAKE 1 CAPSULE(240 MG) BY MOUTH DAILY Berglund, Laura H, MD  Active   ELIQUIS  2.5 MG TABS tablet [id number] Yes TAKE 1 TABLET(2.5 MG) BY MOUTH TWICE DAILY Berglund, Laura H, MD  Active   ENSURE [facility]) [id number] Yes Take 237 mLs by mouth 2 (two) times daily between meals.  Patient taking differently: Take 237 mLs by mouth 3 (three) times daily between meals.   Justus Leita DEL, MD  Active Family Member  furosemide  (LASIX ) 20 MG tablet [id number] Yes TAKE 1 TABLET(20 MG) BY MOUTH DAILY Berglund, Laura H, MD  Active   lisinopril  (ZESTRIL ) 30 MG tablet [id number] Yes Take 1 tablet (30 mg total) by mouth every morning. Herold Hadassah SQUIBB, MD  Active   Multiple Vitamins-Calcium  (ONE-A-DAY WOMENS PO) [id number] Yes Take 1 tablet by mouth 2 (two) times daily. [provider]  Active Family Member  potassium chloride  SA (KLOR-CON  M) 20 MEQ tablet [id number] Yes Take 1 tablet (20 mEq total) by mouth daily for 7 days. Fernand Rossie HERO, MD  Active   sertraline  (ZOLOFT ) 50 MG tablet [id number] Yes TAKE 1 TABLET(50 MG) BY MOUTH DAILY Justus Leita DEL, MD  Active             Recommendation:   PCP Follow-up as needed  Follow Up Plan:   Telephone follow up appointment date/time:  05/15/24 1pm to follow up on CAP service referral  Melvern Ramone,  LCSW [place]  [facility], [facility] Licensed Clinical Social Worker  Direct Dial: [phone]

## 2024-04-26 ENCOUNTER — Ambulatory Visit

## 2024-05-09 ENCOUNTER — Ambulatory Visit (INDEPENDENT_AMBULATORY_CARE_PROVIDER_SITE_OTHER)

## 2024-05-09 DIAGNOSIS — B351 Tinea unguium: Secondary | ICD-10-CM | POA: Diagnosis not present

## 2024-05-09 NOTE — Progress Notes (Signed)
"  °  Subjective:  Patient ID: Vanessa Farmer, female    DOB: 1936/02/15,  MRN: [id number]  88 y.o. female presents with chief concern of elongated, thickened, painful, discolored toenails for months. Patient has tried self attempt at trimming toenail. She denies diabetes.   Chief Complaint  Patient presents with   RFC    Rm6 Routine foot care     New problem(s): elongated toenails  PCP is Herold Hadassah SQUIBB, MD  Allergies[1]  Review of Systems: Negative except as noted in the HPI.   Objective:  Vanessa Farmer is a pleasant 88 y.o. female in NAD. AAO x 3.  Vascular Examination: Vascular status intact b/l with faintly palpable pedal pulses. CFT immediate b/l. Pedal hair absent. No edema. No pain with calf compression b/l. Skin temperature gradient WNL b/l. No varicosities noted. No cyanosis or clubbing noted.  Neurological Examination: Sensation grossly intact b/l with 10 gram monofilament. Negative tinel sign at tarsal tunnel bilaterally.   Dermatological Examination: Pedal skin with normal turgor, texture and tone b/l. No open wounds nor interdigital macerations noted. Toenails 1-5 b/l thick, discolored, elongated with subungual debris and pain on dorsal palpation. Hyperkeratotic lesion without pain to palpation noted at bilateral plantar 5th MTP. No infection.   Musculoskeletal Examination: Muscle strength 5/5 to b/l LE.  No pain, crepitus noted b/l. No gross pedal deformities. Patient ambulates independently without assistive aids.   Radiographs: None Last A1c:      Latest Ref Rng & Units 02/09/2024    2:32 PM  Hemoglobin A1C  Hemoglobin-A1c 4.8 - 5.6 % 5.4      Assessment:   1. Onychomycosis     Plan:  Consent given for treatment. Patient examined. All patient's and/or POA's questions/concerns addressed on today's visit.Toenails 1-5 b/l debrided in length and girth. Minimal bleeding to lateral aspect of third toe that was cleaned, abx ointment applied and covered with  bandaid to be removed tonight. .Continue soft, supportive shoe gear daily. Report any pedal injuries to medical professional. Call office if there are any questions/concerns. -Patient/POA to call should there be question/concern in the interim. -Discussed future care as cash pay service due to not being a diabetic.  -RTC 3 months or as neeed  Prentice Ovens, DPM AACFAS Fellowship Trained Podiatric Surgeon [facility]       [place] LOCATION: [address] [place] [address]                   Office [phone]   [facility] LOCATION: [address] [place], [place] [address] Office [phone]     [1] No Known Allergies  "

## 2024-05-15 ENCOUNTER — Telehealth: Payer: Self-pay | Admitting: *Deleted

## 2024-05-15 ENCOUNTER — Encounter: Payer: Self-pay | Admitting: *Deleted

## 2024-05-15 NOTE — Patient Instructions (Signed)
 Maceo CHRISTELLA Qua - I have attempted to call you three times but have been unsuccessful in reaching you. I work with Herold, Hadassah SQUIBB, MD and am calling to support your healthcare needs. If I can be of assistance to you, please contact me at [phone].     Thank you,    Kaiyden Simkin, LCSW [place]  [facility], [facility] Licensed Clinical Social Worker  Direct Dial: [phone]

## 2024-05-16 NOTE — Progress Notes (Signed)
" °  Cardiology Office Note   Date:  05/17/2024  ID:  Vanessa Farmer, DOB 1935/06/11, MRN [id number] PCP: Herold Hadassah SQUIBB, MD   HeartCare Providers Cardiologist:  Caron Poser, MD     History of Present Illness Vanessa Farmer is a 89 y.o. female PMH HFpEF, PAF, HTN, CKD 3B who presents for further evaluation management of lower extremity edema.  Seen in the ED for this issue 04/04/2024.  Reportedly had increased lower extremity edema despite compliance with Lasix  20 mg daily.  proBNP was 896, troponin 34, CBC at baseline, BMP at baseline.  Last LDL 78 02/2024.  Patient reports dyspnea on exertion.  She had significant lower extremity edema which has improved with Lasix .  Her shortness of breath have also improved with Lasix .  Denies any other symptoms.  Relevant CVD History -TTE 12/2021 normal biventricular function, grade 1 diastolic dysfunction, mild MR -CTPA 10/2022 severe aortic atherosclerosis and multivessel CAC  ROS: Pt denies any chest discomfort, jaw pain, arm pain, palpitations, syncope, presyncope, orthopnea, PND.  Studies Reviewed I have independently reviewed the patient's ECG, recent CT scan, recent medical records, recent blood work.  Physical Exam VS:  BP 130/72 (BP Location: Left Arm, Patient Position: Sitting, Cuff Size: Normal)   Pulse 76   Ht 5' 4 (1.626 m)   Wt 118 lb (53.5 kg)   SpO2 95%   BMI 20.25 kg/m        Wt Readings from Last 3 Encounters:  05/17/24 118 lb (53.5 kg)  04/04/24 121 lb 4.1 oz (55 kg)  02/09/24 121 lb 9.6 oz (55.2 kg)    GEN: No acute distress. NECK: No JVD; No carotid bruits. CARDIAC: RRR, no murmurs, rubs, gallops. RESPIRATORY:  Clear to auscultation. EXTREMITIES:  Warm and well-perfused. No edema.  ASSESSMENT AND PLAN HFpEF LBBB Patient presents for further evaluation of HFpEF.  She is euvolemic in office today with flat neck veins, clear lung fields, and no lower extremity edema.  Still does have some DOE.  NYHA  II-III.  Plan: - Continue Lasix  20 mg daily; double the dose if she notices weight gain greater than 3 pounds in 1 day or 5 pounds in 1 week - Start Farxiga  10 mg daily - Will repeat echocardiogram to ensure she still has preserved ejection fraction  Paroxysmal atrial fibrillation Sinus rhythm.  On dose reduced Eliquis  given age and body weight.  Plan: - Continue Eliquis  2.5 mg twice daily - Continue diltiazem  240 mg XR daily  CAC Aortic atherosclerosis HLD Seen on prior CT imaging.  No angina. Last LDL 78 02/2024.  LDL goal less than 70.  Plan: - No ASA given concomitant Eliquis  - Increase Lipitor to 80 mg daily        Dispo: RTC 3 months or sooner as needed  Signed, Caron Poser, MD  "

## 2024-05-17 ENCOUNTER — Ambulatory Visit

## 2024-05-17 VITALS — BP 130/72 | HR 76 | Ht 64.0 in | Wt 118.0 lb

## 2024-05-17 DIAGNOSIS — I480 Paroxysmal atrial fibrillation: Secondary | ICD-10-CM | POA: Diagnosis not present

## 2024-05-17 DIAGNOSIS — I2510 Atherosclerotic heart disease of native coronary artery without angina pectoris: Secondary | ICD-10-CM

## 2024-05-17 DIAGNOSIS — E785 Hyperlipidemia, unspecified: Secondary | ICD-10-CM | POA: Diagnosis not present

## 2024-05-17 DIAGNOSIS — I447 Left bundle-branch block, unspecified: Secondary | ICD-10-CM | POA: Diagnosis not present

## 2024-05-17 DIAGNOSIS — I700 Atherosclerosis of aorta: Secondary | ICD-10-CM

## 2024-05-17 DIAGNOSIS — E782 Mixed hyperlipidemia: Secondary | ICD-10-CM

## 2024-05-17 DIAGNOSIS — I5032 Chronic diastolic (congestive) heart failure: Secondary | ICD-10-CM

## 2024-05-17 MED ORDER — DAPAGLIFLOZIN PROPANEDIOL 10 MG PO TABS: 10.0000 mg | ORAL_TABLET | Freq: Every day | ORAL | 3 refills | Status: AC

## 2024-05-17 MED ORDER — ATORVASTATIN CALCIUM 80 MG PO TABS: 80.0000 mg | ORAL_TABLET | Freq: Every day | ORAL | 3 refills | Status: AC

## 2024-05-17 NOTE — Patient Instructions (Signed)
 Medication Instructions:  Your physician recommends the following medication changes.  START TAKING: Farxiga  10 mg by mouth once daily   INCREASE: Atorvastatin  (LIPITOR)  from 40 mg to 80 mg  daily *If you need a refill on your cardiac medications before your next appointment, please call your pharmacy*  Lab Work: No labs ordered today  If you have labs (blood work) drawn today and your tests are completely normal, you will receive your results only by: MyChart Message (if you have MyChart) OR A paper copy in the mail If you have any lab test that is abnormal or we need to change your treatment, we will call you to review the results.  Testing/Procedures: Your physician has requested that you have an echocardiogram. Echocardiography is a painless test that uses sound waves to create images of your heart. It provides your doctor with information about the size and shape of your heart and how well your hearts chambers and valves are working.   You may receive an ultrasound enhancing agent through an IV if needed to better visualize your heart during the echo. This procedure takes approximately one hour.  There are no restrictions for this procedure.  This will take place at [address] [facility]) [address], [place] [address]  Please note: We ask at that you not bring children with you during ultrasound (echo/ vascular) testing. Due to room size and safety concerns, children are not allowed in the ultrasound rooms during exams. Our front office staff cannot provide observation of children in our lobby area while testing is being conducted. An adult accompanying a patient to their appointment will only be allowed in the ultrasound room at the discretion of the ultrasound technician under special circumstances. We apologize for any inconvenience.   Follow-Up: At [facility], you and your health needs are our priority.  As part of our continuing mission to provide  you with exceptional heart care, our providers are all part of one team.  This team includes your primary Cardiologist (physician) and Advanced Practice Providers or APPs (Physician Assistants and Nurse Practitioners) who all work together to provide you with the care you need, when you need it.  Your next appointment:   3 month(s)  Provider:   You will see one of the following Advanced Practice Providers on your designated Care Team:   Lonni Meager, NP Lesley Maffucci, PA-C Bernardino Bring, PA-C Cadence Angelis Gates, PA-C Tylene Lunch, NP Barnie Hila, NP    We recommend signing up for the patient portal called MyChart.  Sign up information is provided on this After Visit Summary.  MyChart is used to connect with patients for Virtual Visits (Telemedicine).  Patients are able to view lab/test results, encounter notes, upcoming appointments, etc.  Non-urgent messages can be sent to your provider as well.   To learn more about what you can do with MyChart, go to [email or web address].

## 2024-06-04 ENCOUNTER — Ambulatory Visit

## 2024-08-15 ENCOUNTER — Ambulatory Visit: Admitting: Physician Assistant

## 2024-08-16 ENCOUNTER — Ambulatory Visit: Admitting: Podiatry
